# Patient Record
Sex: Male | Born: 1940 | Race: Black or African American | Marital: Married | Smoking: Former smoker
Health system: Southern US, Community
[De-identification: ages and names within clinical notes are randomized; demographics above are authoritative.]

## PROBLEM LIST (undated history)

## (undated) DIAGNOSIS — I10 Essential (primary) hypertension: Secondary | ICD-10-CM

## (undated) DIAGNOSIS — H409 Unspecified glaucoma: Secondary | ICD-10-CM

## (undated) DIAGNOSIS — E7800 Pure hypercholesterolemia, unspecified: Secondary | ICD-10-CM

## (undated) DIAGNOSIS — F1010 Alcohol abuse, uncomplicated: Secondary | ICD-10-CM

## (undated) DIAGNOSIS — M109 Gout, unspecified: Secondary | ICD-10-CM

## (undated) DIAGNOSIS — F0390 Unspecified dementia without behavioral disturbance: Secondary | ICD-10-CM

## (undated) DIAGNOSIS — H269 Unspecified cataract: Secondary | ICD-10-CM

## (undated) DIAGNOSIS — Z86718 Personal history of other venous thrombosis and embolism: Secondary | ICD-10-CM

## (undated) HISTORY — PX: HERNIA REPAIR: SHX51

## (undated) HISTORY — DX: Personal history of other venous thrombosis and embolism: Z86.718

## (undated) HISTORY — DX: Alcohol abuse, uncomplicated: F10.10

## (undated) HISTORY — DX: Pure hypercholesterolemia, unspecified: E78.00

## (undated) HISTORY — DX: Unspecified cataract: H26.9

## (undated) HISTORY — DX: Gout, unspecified: M10.9

## (undated) HISTORY — DX: Unspecified glaucoma: H40.9

---

## 2006-02-25 DIAGNOSIS — C61 Malignant neoplasm of prostate: Secondary | ICD-10-CM

## 2006-02-25 HISTORY — DX: Malignant neoplasm of prostate: C61

## 2011-02-26 DIAGNOSIS — Z5189 Encounter for other specified aftercare: Secondary | ICD-10-CM

## 2011-02-26 HISTORY — DX: Encounter for other specified aftercare: Z51.89

## 2019-02-26 DIAGNOSIS — C109 Malignant neoplasm of oropharynx, unspecified: Secondary | ICD-10-CM

## 2019-02-26 HISTORY — DX: Malignant neoplasm of oropharynx, unspecified: C10.9

## 2019-05-12 ENCOUNTER — Inpatient Hospital Stay: Payer: Medicare Other | Attending: Oncology | Admitting: Oncology

## 2019-05-12 ENCOUNTER — Inpatient Hospital Stay: Payer: Medicare Other

## 2019-05-12 ENCOUNTER — Encounter: Payer: Self-pay | Admitting: Oncology

## 2019-05-12 VITALS — BP 130/79 | HR 65 | Temp 97.4°F | Wt 113.2 lb

## 2019-05-12 DIAGNOSIS — F1010 Alcohol abuse, uncomplicated: Secondary | ICD-10-CM | POA: Diagnosis not present

## 2019-05-12 DIAGNOSIS — Z87891 Personal history of nicotine dependence: Secondary | ICD-10-CM | POA: Diagnosis not present

## 2019-05-12 DIAGNOSIS — R5383 Other fatigue: Secondary | ICD-10-CM | POA: Diagnosis not present

## 2019-05-12 DIAGNOSIS — R221 Localized swelling, mass and lump, neck: Secondary | ICD-10-CM

## 2019-05-12 DIAGNOSIS — I712 Thoracic aortic aneurysm, without rupture: Secondary | ICD-10-CM

## 2019-05-12 DIAGNOSIS — I7121 Aneurysm of the ascending aorta, without rupture: Secondary | ICD-10-CM

## 2019-05-12 LAB — COMPREHENSIVE METABOLIC PANEL
ALT: 16 U/L (ref 0–44)
AST: 20 U/L (ref 15–41)
Albumin: 4 g/dL (ref 3.5–5.0)
Alkaline Phosphatase: 57 U/L (ref 38–126)
Anion gap: 8 (ref 5–15)
BUN: 14 mg/dL (ref 8–23)
CO2: 28 mmol/L (ref 22–32)
Calcium: 9.4 mg/dL (ref 8.9–10.3)
Chloride: 103 mmol/L (ref 98–111)
Creatinine, Ser: 0.63 mg/dL (ref 0.61–1.24)
GFR calc Af Amer: >60 mL/min (ref >60)
GFR calc non Af Amer: >60 mL/min (ref >60)
Glucose, Bld: 79 mg/dL (ref 70–99)
Potassium: 4 mmol/L (ref 3.5–5.1)
Sodium: 139 mmol/L (ref 135–145)
Total Bilirubin: 1.2 mg/dL (ref 0.3–1.2)
Total Protein: 7.7 g/dL (ref 6.5–8.1)

## 2019-05-12 LAB — CBC WITH DIFFERENTIAL/PLATELET
Abs Immature Granulocytes: 0.01 10*3/uL (ref 0.00–0.07)
Basophils Absolute: 0 10*3/uL (ref 0.0–0.1)
Basophils Relative: 0 %
Eosinophils Absolute: 0.2 10*3/uL (ref 0.0–0.5)
Eosinophils Relative: 3 %
HCT: 39.5 % (ref 39.0–52.0)
Hemoglobin: 12.7 g/dL — ABNORMAL LOW (ref 13.0–17.0)
Immature Granulocytes: 0 %
Lymphocytes Relative: 23 %
Lymphs Abs: 1.3 10*3/uL (ref 0.7–4.0)
MCH: 30.2 pg (ref 26.0–34.0)
MCHC: 32.2 g/dL (ref 30.0–36.0)
MCV: 93.8 fL (ref 80.0–100.0)
Monocytes Absolute: 0.4 10*3/uL (ref 0.1–1.0)
Monocytes Relative: 8 %
Neutro Abs: 3.9 10*3/uL (ref 1.7–7.7)
Neutrophils Relative %: 66 %
Platelets: 222 10*3/uL (ref 150–400)
RBC: 4.21 MIL/uL — ABNORMAL LOW (ref 4.22–5.81)
RDW: 12.7 % (ref 11.5–15.5)
WBC: 5.8 10*3/uL (ref 4.0–10.5)
nRBC: 0 % (ref 0.0–0.2)

## 2019-05-12 LAB — TSH: TSH: 1.105 u[IU]/mL (ref 0.350–4.500)

## 2019-05-12 LAB — LACTATE DEHYDROGENASE: LDH: 100 U/L (ref 98–192)

## 2019-05-12 LAB — PROTIME-INR
INR: 1.1 (ref 0.8–1.2)
Prothrombin Time: 13.9 seconds (ref 11.4–15.2)

## 2019-05-12 LAB — APTT: aPTT: 31 seconds (ref 24–36)

## 2019-05-12 LAB — VITAMIN B12: Vitamin B-12: 569 pg/mL (ref 180–914)

## 2019-05-12 LAB — FOLATE: Folate: >100 ng/mL (ref >5.9)

## 2019-05-12 NOTE — Progress Notes (Signed)
Patient has recently moved to [facility] from [place].  Before making the move 3 weeks ago, he had a work up for right neck lymph node swollen for approximately for 7 months.  The swollen lymph node is "sore", makes him cough, and he can feel it when he swallows with no trouble swallowing.  He reports a decrease in appetite that has caused him to have recent weight loss.

## 2019-05-14 ENCOUNTER — Encounter: Payer: Self-pay | Admitting: Oncology

## 2019-05-14 DIAGNOSIS — Z87891 Personal history of nicotine dependence: Secondary | ICD-10-CM | POA: Insufficient documentation

## 2019-05-14 DIAGNOSIS — R5383 Other fatigue: Secondary | ICD-10-CM | POA: Insufficient documentation

## 2019-05-14 DIAGNOSIS — R221 Localized swelling, mass and lump, neck: Secondary | ICD-10-CM | POA: Insufficient documentation

## 2019-05-14 DIAGNOSIS — F1010 Alcohol abuse, uncomplicated: Secondary | ICD-10-CM | POA: Insufficient documentation

## 2019-05-14 DIAGNOSIS — I7121 Aneurysm of the ascending aorta, without rupture: Secondary | ICD-10-CM | POA: Insufficient documentation

## 2019-05-14 DIAGNOSIS — I712 Thoracic aortic aneurysm, without rupture: Secondary | ICD-10-CM | POA: Insufficient documentation

## 2019-05-14 NOTE — Progress Notes (Signed)
Hematology/Oncology Consult note [facility] Telephone:([phone] [phone]   Patient Care Team: Casilda Carls, MD as PCP - General (Internal Medicine)  REFERRING PROVIDER: Casilda Carls, MD  CHIEF COMPLAINTS/REASON FOR VISIT:  Evaluation of neck lymph node.   HISTORY OF PRESENTING ILLNESS:   Gregory Burton is a  79 y.o.  male with PMH listed below was seen in consultation at the request of  Casilda Carls, MD  for evaluation of lymph node.  Patient moved from [place] to [place] for about 3 weeks. He has noticed a neck knot on the right side of his neck. The knot is sore and makes him to cough and he feels it when he swallows. No swallowing difficulty.  Patient was accompanied by his wife. She reports that patient's previous PCP has done neck sonogram for evaluation.  Patient also had right lower molar tooth extraction done a few weeks before he noticed  He also took a course of antibiotics.  Due to his tooth pain, his appetite has decreased and he has lost weight loss, about 20 pounds, he can not specify the time frame.   Alcoholism History of prostate cancer.   Review of Systems  Constitutional: Positive for appetite change, fatigue and unexpected weight change. Negative for chills and fever.  HENT:   Negative for hearing loss and voice change.   Eyes: Negative for eye problems and icterus.  Respiratory: Negative for chest tightness, cough and shortness of breath.   Cardiovascular: Negative for chest pain and leg swelling.  Gastrointestinal: Negative for abdominal distention and abdominal pain.  Endocrine: Negative for hot flashes.  Genitourinary: Negative for difficulty urinating, dysuria and frequency.   Musculoskeletal: Negative for arthralgias.  Skin: Negative for itching and rash.  Neurological: Negative for light-headedness and numbness.  Hematological: Negative for adenopathy. Does not bruise/bleed easily.    Psychiatric/Behavioral: Negative for confusion.    MEDICAL HISTORY:  Past Medical History:  Diagnosis Date  . Alcohol abuse   . Blood transfusion without reported diagnosis 2013  . Cataract   . Glaucoma   . Gout   . Hypercholesteremia   . Prostate CA [facility]) 2008    SURGICAL HISTORY: Past Surgical History:  Procedure Laterality Date  . BRAIN SURGERY  2012  . HERNIA REPAIR      SOCIAL HISTORY: Social History   Socioeconomic History  . Marital status: Married    Spouse name: Not on file  . Number of children: Not on file  . Years of education: Not on file  . Highest education level: Not on file  Occupational History  . Not on file  Tobacco Use  . Smoking status: Former Smoker    Years: 21.00    Types: Cigarettes    Quit date: 05/11/2008    Years since quitting: 11.0  . Smokeless tobacco: Never Used  Substance and Sexual Activity  . Alcohol use: Yes    Comment: 0.5 pint liquor a week  . Drug use: Not on file  . Sexual activity: Not Currently  Other Topics Concern  . Not on file  Social History Narrative  . Not on file   Social Determinants of Health   Financial Resource Strain:   . Difficulty of Paying Living Expenses:   Food Insecurity:   . Worried About Charity fundraiser in the Last Year:   . Arboriculturist in the Last Year:   Transportation Needs:   . Film/video editor (Medical):   Marland Kitchen  Lack of Transportation (Non-Medical):   Physical Activity:   . Days of Exercise per Week:   . Minutes of Exercise per Session:   Stress:   . Feeling of Stress :   Social Connections:   . Frequency of Communication with Friends and Family:   . Frequency of Social Gatherings with Friends and Family:   . Attends Religious Services:   . Active Member of Clubs or Organizations:   . Attends Archivist Meetings:   Marland Kitchen Marital Status:   Intimate Partner Violence:   . Fear of Current or Ex-Partner:   . Emotionally Abused:   Marland Kitchen Physically Abused:   .  Sexually Abused:     FAMILY HISTORY: History reviewed. No pertinent family history.  ALLERGIES:  is allergic to enalapril.  MEDICATIONS:  Current Outpatient Medications  Medication Sig Dispense Refill  . atorvastatin (LIPITOR) 20 MG tablet Take 20 mg by mouth daily.    Marland Kitchen donepezil (ARICEPT) 10 MG tablet Take 10 mg by mouth at bedtime.    . dorzolamide-timolol (COSOPT) 22.3-6.8 MG/ML ophthalmic solution 1 drop 2 (two) times daily.    . folic acid (FOLVITE) 1 MG tablet Take 1 mg by mouth daily.    Marland Kitchen latanoprost (XALATAN) 0.005 % ophthalmic solution 1 drop at bedtime.    . predniSONE (DELTASONE) 5 MG tablet Take 5 mg by mouth daily with breakfast. As needed for gout flare    . Thiamine HCl (VITAMIN B-1 PO) Take by mouth. 100 mg QD    . Vitamin D, Cholecalciferol, 50 MCG (2000 UT) CAPS Take 1 capsule by mouth.     No current facility-administered medications for this visit.     PHYSICAL EXAMINATION: ECOG PERFORMANCE STATUS: 1 - Symptomatic but completely ambulatory Vitals:   05/12/19 1455  BP: 130/79  Pulse: 65  Temp: (!) 97.4 F (36.3 C)   Filed Weights   05/12/19 1455  Weight: 113 lb 3.2 oz (51.3 kg)    Physical Exam Constitutional:      General: He is not in acute distress. HENT:     Head: Normocephalic and atraumatic.  Eyes:     General: No scleral icterus. Neck:     Comments: Palpable 1-2cm right neck mass  Cardiovascular:     Rate and Rhythm: Normal rate and regular rhythm.     Heart sounds: Normal heart sounds.  Pulmonary:     Effort: Pulmonary effort is normal. No respiratory distress.     Breath sounds: No wheezing.  Abdominal:     General: Bowel sounds are normal. There is no distension.     Palpations: Abdomen is soft.  Musculoskeletal:        General: No deformity. Normal range of motion.     Cervical back: Normal range of motion and neck supple.  Skin:    General: Skin is warm and dry.     Findings: No erythema or rash.  Neurological:     Mental  Status: He is alert and oriented to person, place, and time. Mental status is at baseline.     Cranial Nerves: No cranial nerve deficit.     Coordination: Coordination normal.  Psychiatric:        Mood and Affect: Mood normal.     LABORATORY DATA:  I have reviewed the data as listed Lab Results  Component Value Date   WBC 5.8 05/12/2019   HGB 12.7 (L) 05/12/2019   HCT 39.5 05/12/2019   MCV 93.8 05/12/2019   PLT 222  05/12/2019   Recent Labs    05/12/19 1603  NA 139  K 4.0  CL 103  CO2 28  GLUCOSE 79  BUN 14  CREATININE 0.63  CALCIUM 9.4  GFRNONAA >60  GFRAA >60  PROT 7.7  ALBUMIN 4.0  AST 20  ALT 16  ALKPHOS 57  BILITOT 1.2   Iron/TIBC/Ferritin/ %Sat No results found for: IRON, TIBC, FERRITIN, IRONPCTSAT    RADIOGRAPHIC STUDIES: I have personally reviewed the radiological images as listed and agreed with the findings in the report. No results found.    ASSESSMENT & PLAN:  1. Neck mass   2. Other fatigue   3. Alcohol abuse   4. Former smoker   5. 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HCC)    Right neck mass, duration >3 weeks, recent right lower molar tooth extraction, s/p antibiotics course.  Risk factor: Former smoker and alcohol use. Infectious etiology vs malignancy I will obtain CT neck with contrast for further evaluation.  Check cbc cmp, LDH, PT, PTT, TSH.   alcoholism, cessation was discussed. He is not interested.  Check folate and vitamin B12 level.   Former smoker, he has had lung cancer screening chest CT which no suspicious lung mass.  Ascending aortic aneurysm, 4cm. Not changed from 2018. - monitor.  Calcified pleural plaques Extensive coronary artery calcification.   Orders Placed This Encounter  Procedures  . CBC with Differential/Platelet    Standing Status:   Future    Number of Occurrences:   1    Standing Expiration Date:   05/11/2020  . Comprehensive metabolic panel    Standing Status:   Future    Number of Occurrences:   1     Standing Expiration Date:   05/11/2020  . Lactate dehydrogenase    Standing Status:   Future    Number of Occurrences:   1    Standing Expiration Date:   05/11/2020  . APTT    Standing Status:   Future    Number of Occurrences:   1    Standing Expiration Date:   05/11/2020  . Protime-INR    Standing Status:   Future    Number of Occurrences:   1    Standing Expiration Date:   05/11/2020  . TSH    Standing Status:   Future    Number of Occurrences:   1    Standing Expiration Date:   05/11/2020  . Vitamin B12    Standing Status:   Future    Number of Occurrences:   1    Standing Expiration Date:   11/11/2020  . Folate    Standing Status:   Future    Number of Occurrences:   1    Standing Expiration Date:   11/11/2020    All questions were answered. The patient knows to call the clinic with any problems questions or concerns.  cc Casilda Carls, MD    Return of visit: after CT scan.  Thank you for this kind referral and the opportunity to participate in the care of this patient. A copy of today's note is routed to referring provider    Earlie Server, MD, PhD Hematology Oncology [facility] Pager- [phone] 05/14/2019

## 2019-05-17 ENCOUNTER — Telehealth: Payer: Self-pay

## 2019-05-17 NOTE — Telephone Encounter (Signed)
-----   Message from Earlie Server, MD sent at 05/14/2019 10:45 PM EDT ----- Please let patient know that I recommend to obtain CT neck for further evaluation of his neck mass.  MD follow up after CT. Thanks.

## 2019-05-17 NOTE — Telephone Encounter (Signed)
Please schedule appts and notifiy pt. Wife is aware. Thanks

## 2019-05-17 NOTE — Telephone Encounter (Signed)
Done.. Pts appts has been scheduled as requested

## 2019-05-24 ENCOUNTER — Ambulatory Visit: Payer: Medicare Other

## 2019-05-27 ENCOUNTER — Other Ambulatory Visit: Payer: Self-pay

## 2019-05-27 ENCOUNTER — Ambulatory Visit
Admission: RE | Admit: 2019-05-27 | Discharge: 2019-05-27 | Disposition: A | Discharge status: Home / Self Care | Payer: Medicare Other | Source: Ambulatory Visit | Attending: Oncology | Admitting: Oncology

## 2019-05-27 DIAGNOSIS — R221 Localized swelling, mass and lump, neck: Secondary | ICD-10-CM | POA: Diagnosis not present

## 2019-05-27 IMAGING — CT CT NECK W/ CM
4 of 5 series · 14 of 33 positions shown, 16 images · IV contrast (omnipaque)
Comparison: None.

CLINICAL DATA: Right neck mass

EXAM:
CT NECK WITH CONTRAST
TECHNIQUE: Multidetector CT imaging of the neck was performed using the
standard protocol following the bolus administration of intravenous
contrast.
CONTRAST:  75mL OMNIPAQUE IOHEXOL 300 MG/ML  SOLN

[Series 2: axial neck neck (person_name) 2.00 · axial · 0.62mm/px · z∈[-754,-674]mm · 2 of 122 slices shown]
[im 41/122  bone]
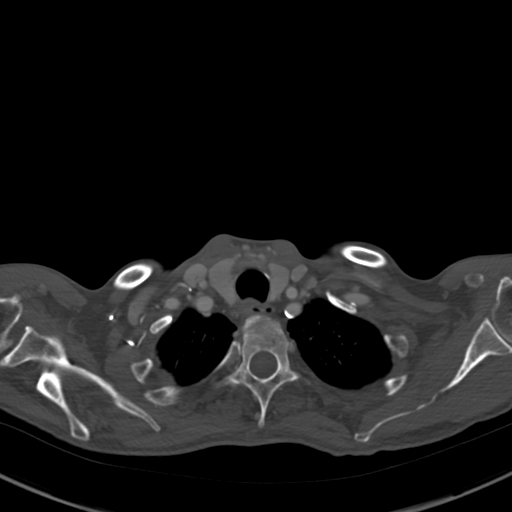
[im 81/122  bone]
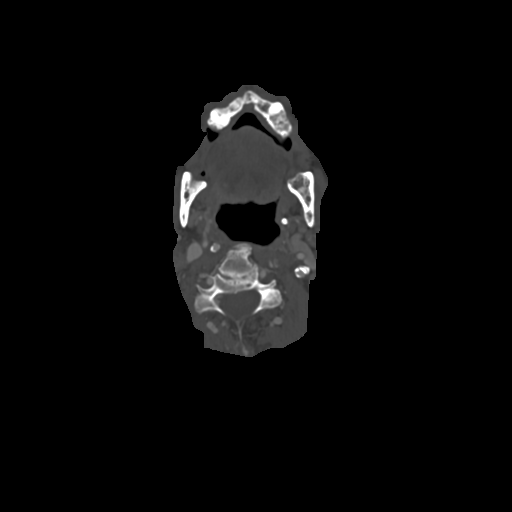

[Series 4: coronal neck neck (person_name) 2.00 cor · coronal · 0.56mm/px · 3 of 137 slices shown]
[im 48/137  bone]
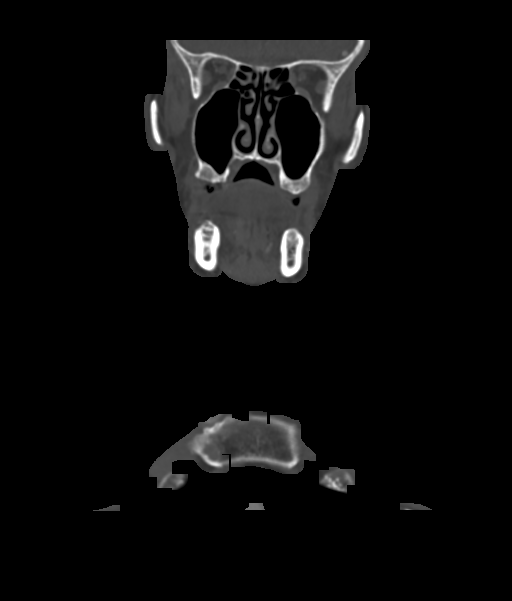
[im 62/137  bone]
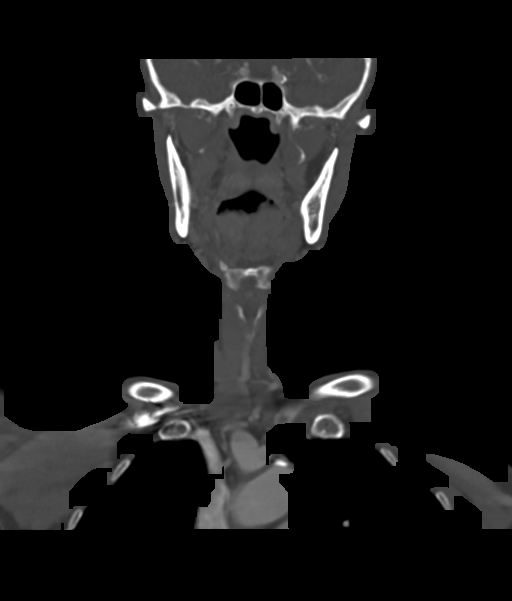
[im 76/137  bone]
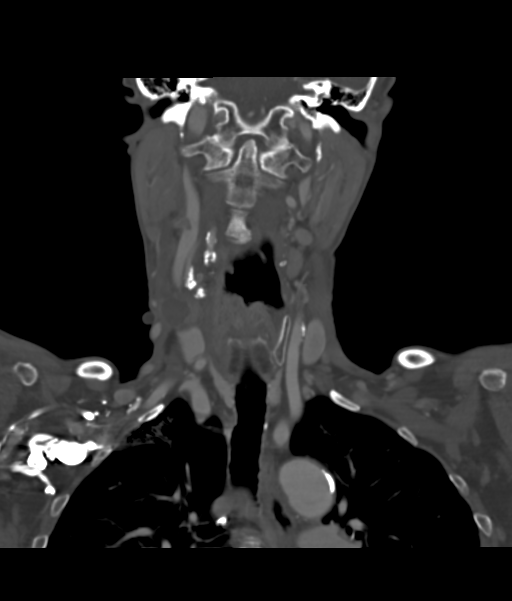

[Series 6: sagittal neck neck (person_name) 2.00 sag · sagittal · 0.54mm/px · 5 of 143 slices shown, 6 images]
[im 48/143  bone]
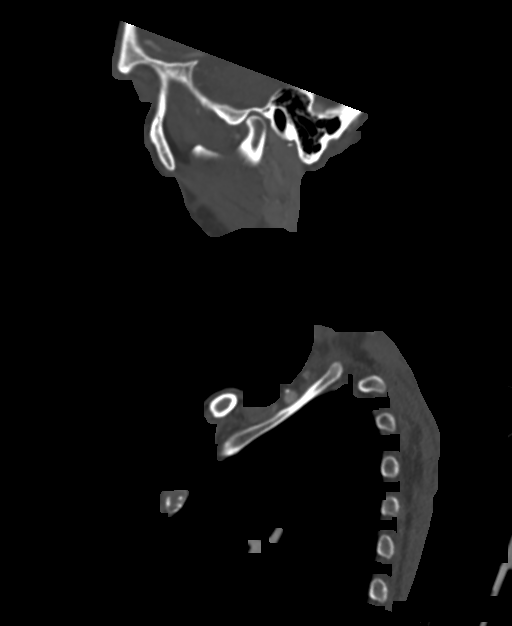
[im 60/143  bone]
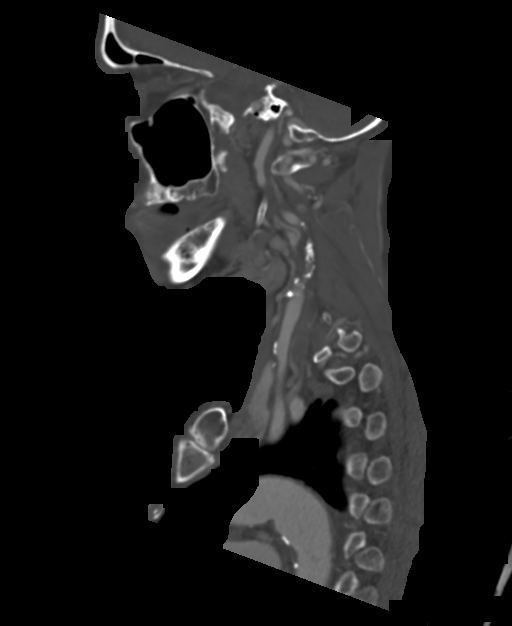
[im 72/143  soft-tissue]
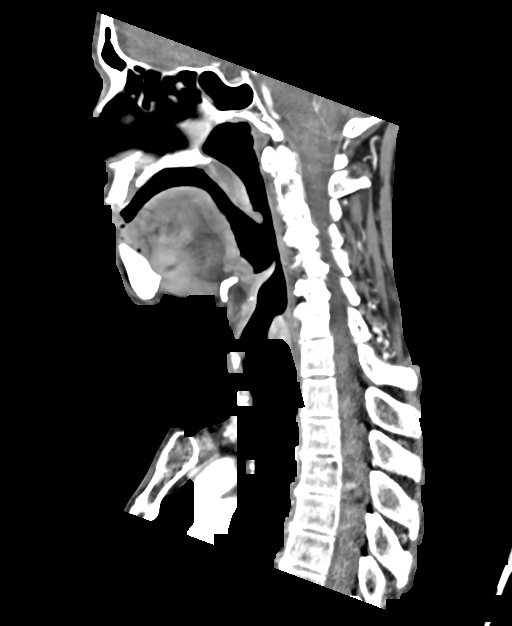
[im 72/143  bone]
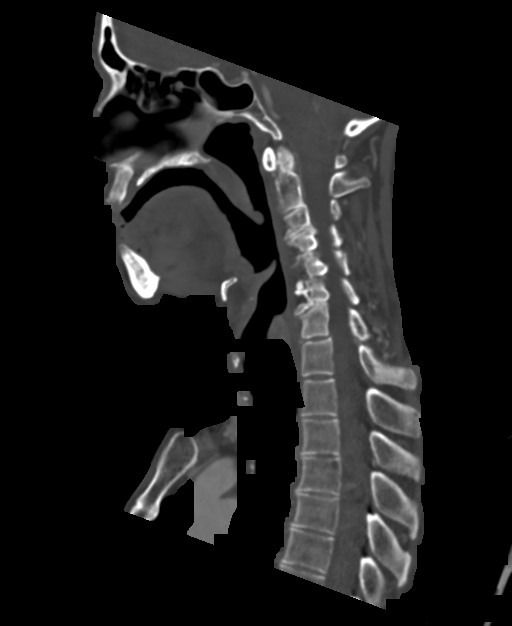
[im 83/143  bone]
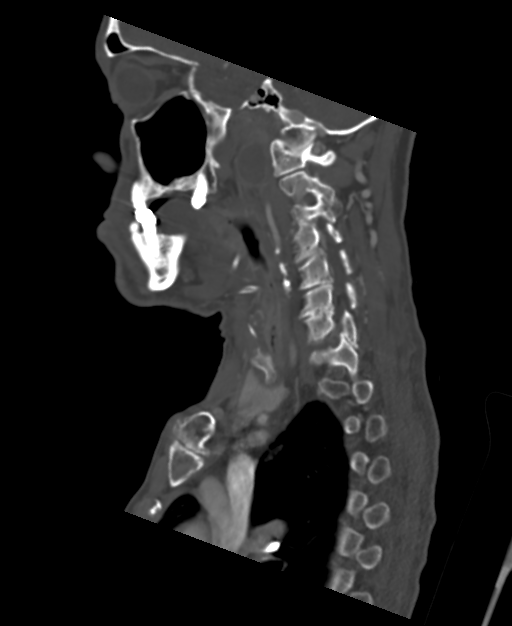
[im 95/143  bone]
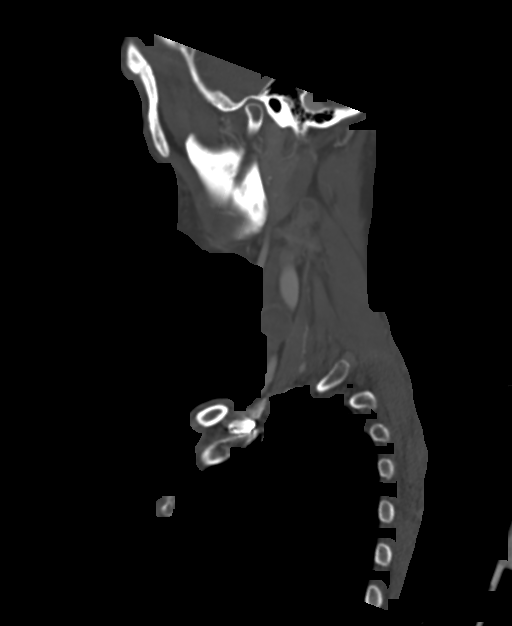

[Series 8: ax oropharynx neck neck (person_name) 2.00 ax · axial · 0.54mm/px · z∈[-854,-666]mm · 4 of 168 slices shown, 5 images]
[im 34/168  soft-tissue]
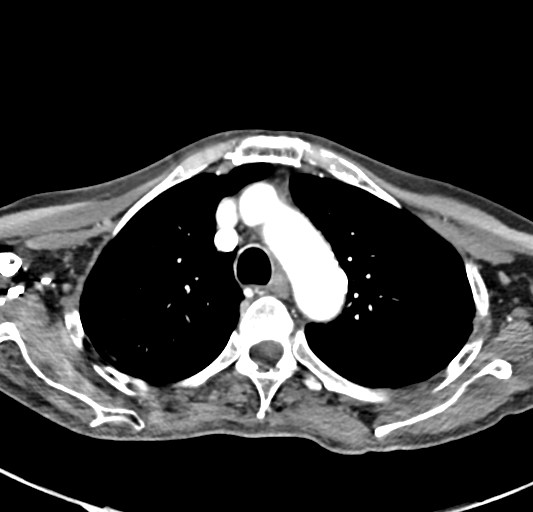
[im 34/168  bone]
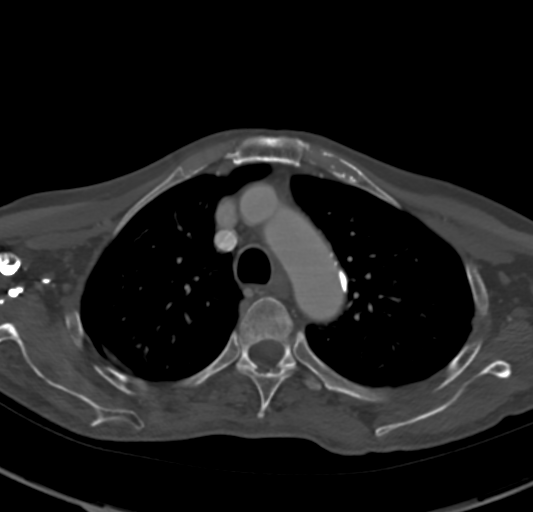
[im 67/168  bone]
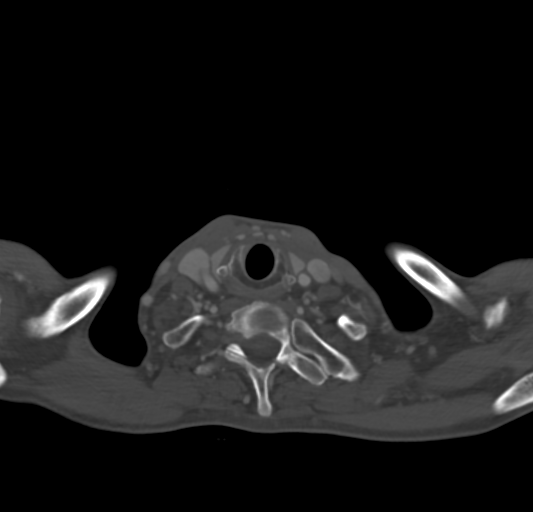
[im 101/168  bone]
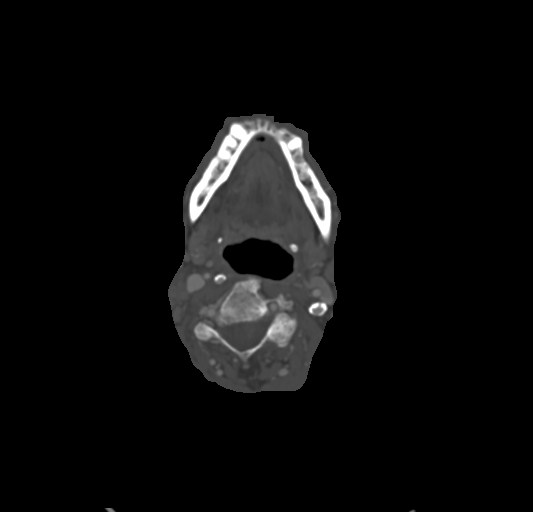
[im 134/168  bone]
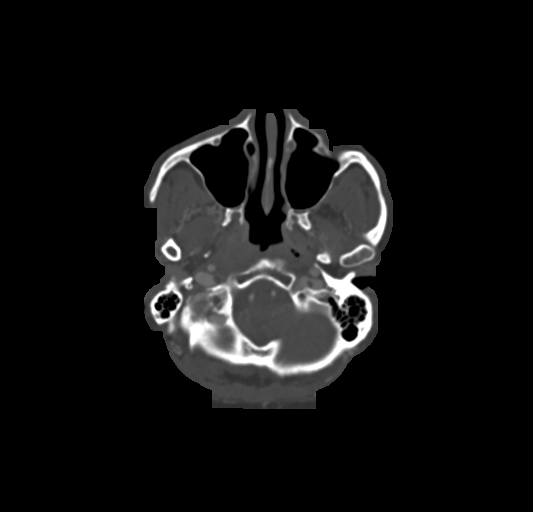

[14 of 33 positions shown; findings below may reference images not displayed]

FINDINGS: Pharynx and larynx: Enhancing mass in the right piriform sinus
measuring 17 mm in diameter. Appearance is concerning for neoplasm.
Possible extension into the base of the epiglottis on the right.
Base of tongue normal. No airway compromise. Larynx normal.

Salivary glands: No inflammation, mass, or stone.

Thyroid: Negative

Lymph nodes: Cystic mass in the right lateral neck at the level of
the piriform sinus. Cyst measures 15 x 24 mm and is deep to the
sternocleidomastoid muscle. There is mild enhancement of the wall
the cyst which is likely a necrotic lymph node.

Cystic mass in the retropharyngeal soft tissues on the right in the
region of the nasopharynx. This measures approximately 18 mm in
diameter and likely is a necrotic lymph node.

Cluster of lymph nodes in the right posterior neck measuring up to
12 mm in diameter. These are also likely malignant lymph nodes.

Vascular: Normal vascular enhancement.

Atherosclerotic calcification is present in the aortic arch and
carotid bifurcation. There is severe stenosis of the right internal
carotid artery due to heavily calcified plaque with decreased
caliber of the distal right internal carotid artery due to flow
limiting stenosis.

Posterior lymph node on the right appears to extend into the right
jugular vein without occlusion.

Limited intracranial: Negative

Visualized orbits: Negative

Mastoids and visualized paranasal sinuses: Mild mucosal edema in the
ethmoid sinuses. Remaining sinuses clear. Mastoid clear bilaterally.

Skeleton: Cervical spondylosis. Thoracic scoliosis. No acute
skeletal abnormality.

Right lower molar extraction site without complication.

Upper chest: 10 mm spiculated nodule left upper lobe with central
necrosis. Right apex clear.

Other: None
IMPRESSION: 1. Solid enhancing mass in the right piriform sinus measuring 17 mm,
highly suspicious for carcinoma.
2. Multiple lymph nodes in the right neck including cystic nodes in
the right posterior nasopharynx and right lateral neck. Posterior
lymph nodes in the right neck also likely malignant, 1 of which
extends into the right jugular vein. Tissue sampling and direct
laryngoscopy recommended to evaluate for neoplasm.
3. 10 mm spiculated mass left upper lobe likely carcinoma lung.
PET-CT would be helpful for further evaluation and staging.
4. Atherosclerotic disease. Severe stenosis right internal carotid
artery due to heavily calcified plaque. This appears to be a flow
limiting lesion with decreased caliber of the right internal carotid
artery which is patent.

## 2019-05-27 MED ORDER — IOHEXOL 300 MG/ML  SOLN
75.0000 mL | Freq: Once | INTRAMUSCULAR | Status: AC | PRN
  Administered 2019-05-27: 75 mL via INTRAVENOUS

## 2019-05-28 ENCOUNTER — Encounter: Payer: Self-pay | Admitting: Oncology

## 2019-05-28 ENCOUNTER — Inpatient Hospital Stay: Payer: Medicare Other | Attending: Oncology | Admitting: Oncology

## 2019-05-28 ENCOUNTER — Inpatient Hospital Stay: Payer: Medicare Other

## 2019-05-28 ENCOUNTER — Other Ambulatory Visit: Payer: Self-pay

## 2019-05-28 ENCOUNTER — Telehealth: Payer: Self-pay | Admitting: *Deleted

## 2019-05-28 VITALS — BP 133/81 | HR 68 | Temp 96.8°F | Resp 16 | Wt 113.4 lb

## 2019-05-28 DIAGNOSIS — R221 Localized swelling, mass and lump, neck: Secondary | ICD-10-CM

## 2019-05-28 DIAGNOSIS — Z79899 Other long term (current) drug therapy: Secondary | ICD-10-CM | POA: Diagnosis not present

## 2019-05-28 DIAGNOSIS — R634 Abnormal weight loss: Secondary | ICD-10-CM | POA: Insufficient documentation

## 2019-05-28 DIAGNOSIS — F1020 Alcohol dependence, uncomplicated: Secondary | ICD-10-CM | POA: Insufficient documentation

## 2019-05-28 DIAGNOSIS — R911 Solitary pulmonary nodule: Secondary | ICD-10-CM

## 2019-05-28 DIAGNOSIS — Z87891 Personal history of nicotine dependence: Secondary | ICD-10-CM

## 2019-05-28 DIAGNOSIS — F1010 Alcohol abuse, uncomplicated: Secondary | ICD-10-CM

## 2019-05-28 DIAGNOSIS — I712 Thoracic aortic aneurysm, without rupture: Secondary | ICD-10-CM | POA: Diagnosis not present

## 2019-05-28 DIAGNOSIS — Z8546 Personal history of malignant neoplasm of prostate: Secondary | ICD-10-CM | POA: Diagnosis not present

## 2019-05-28 DIAGNOSIS — C109 Malignant neoplasm of oropharynx, unspecified: Secondary | ICD-10-CM | POA: Insufficient documentation

## 2019-05-28 DIAGNOSIS — R630 Anorexia: Secondary | ICD-10-CM | POA: Diagnosis not present

## 2019-05-28 DIAGNOSIS — I6529 Occlusion and stenosis of unspecified carotid artery: Secondary | ICD-10-CM | POA: Diagnosis not present

## 2019-05-28 DIAGNOSIS — I7121 Aneurysm of the ascending aorta, without rupture: Secondary | ICD-10-CM

## 2019-05-28 LAB — PSA: Prostatic Specific Antigen: <0.01 ng/mL (ref 0.00–4.00)

## 2019-05-28 LAB — PROTIME-INR
INR: 1.1 (ref 0.8–1.2)
Prothrombin Time: 14 seconds (ref 11.4–15.2)

## 2019-05-28 LAB — APTT: aPTT: 33 seconds (ref 24–36)

## 2019-05-28 NOTE — Telephone Encounter (Signed)
Please arrange redraw. Thanks.

## 2019-05-28 NOTE — Telephone Encounter (Signed)
Pt will be coming back today for lab redraw. Lab is aware.

## 2019-05-28 NOTE — Progress Notes (Signed)
Hematology/Oncology Consult note [facility] Telephone:([phone] [phone]   Patient Care Team: Casilda Carls, MD as PCP - General (Internal Medicine)  REFERRING PROVIDER: Casilda Carls, MD  CHIEF COMPLAINTS/REASON FOR VISIT:   follow up for neck lymph node.   HISTORY OF PRESENTING ILLNESS:   Tagen Milby is a  79 y.o.  male with PMH listed below was seen in consultation at the request of  Casilda Carls, MD  for evaluation of lymph node.  Patient moved from [place] to [place] for about 3 weeks. He has noticed a neck knot on the right side of his neck. The knot is sore and makes him to cough and he feels it when he swallows. No swallowing difficulty.  Patient was accompanied by his wife. She reports that patient's previous PCP has done neck sonogram for evaluation.  Patient also had right lower molar tooth extraction done a few weeks before he noticed  He also took a course of antibiotics.  Due to his tooth pain, his appetite has decreased and he has lost weight loss, about 20 pounds, he can not specify the time frame.   Alcoholism History of prostate cancer, he follows up with [facility].   INTERVAL HISTORY Romaine Neville is a 79 y.o. male who has above history reviewed by me today presents for follow up visit for management of neck mass Problems and complaints are listed below: He continues to have some soreness along his neck mass.  He has a chronic cough due to " dry throat".   Review of Systems  Constitutional: Positive for appetite change, fatigue and unexpected weight change. Negative for chills and fever.  HENT:   Negative for hearing loss and voice change.   Eyes: Negative for eye problems and icterus.  Respiratory: Negative for chest tightness, cough and shortness of breath.   Cardiovascular: Negative for chest pain and leg swelling.  Gastrointestinal: Negative for abdominal distention and abdominal pain.  Endocrine:  Negative for hot flashes.  Genitourinary: Negative for difficulty urinating, dysuria and frequency.   Musculoskeletal: Negative for arthralgias.  Skin: Negative for itching and rash.  Neurological: Negative for light-headedness and numbness.  Hematological: Negative for adenopathy. Does not bruise/bleed easily.  Psychiatric/Behavioral: Negative for confusion.    MEDICAL HISTORY:  Past Medical History:  Diagnosis Date  . Alcohol abuse   . Blood transfusion without reported diagnosis 2013  . Cataract   . Glaucoma   . Gout   . Hypercholesteremia   . Prostate CA [facility]) 2008    SURGICAL HISTORY: Past Surgical History:  Procedure Laterality Date  . BRAIN SURGERY  2012  . HERNIA REPAIR      SOCIAL HISTORY: Social History   Socioeconomic History  . Marital status: Married    Spouse name: Not on file  . Number of children: Not on file  . Years of education: Not on file  . Highest education level: Not on file  Occupational History  . Not on file  Tobacco Use  . Smoking status: Former Smoker    Years: 21.00    Types: Cigarettes    Quit date: 05/11/2008    Years since quitting: 11.0  . Smokeless tobacco: Never Used  Substance and Sexual Activity  . Alcohol use: Yes    Comment: 0.5 pint liquor a week  . Drug use: Not on file  . Sexual activity: Not Currently  Other Topics Concern  . Not on file  Social History Narrative  . Not on  file   Social Determinants of Health   Financial Resource Strain:   . Difficulty of Paying Living Expenses:   Food Insecurity:   . Worried About Charity fundraiser in the Last Year:   . Arboriculturist in the Last Year:   Transportation Needs:   . Film/video editor (Medical):   Marland Kitchen Lack of Transportation (Non-Medical):   Physical Activity:   . Days of Exercise per Week:   . Minutes of Exercise per Session:   Stress:   . Feeling of Stress :   Social Connections:   . Frequency of Communication with Friends and Family:   . Frequency  of Social Gatherings with Friends and Family:   . Attends Religious Services:   . Active Member of Clubs or Organizations:   . Attends Archivist Meetings:   Marland Kitchen Marital Status:   Intimate Partner Violence:   . Fear of Current or Ex-Partner:   . Emotionally Abused:   Marland Kitchen Physically Abused:   . Sexually Abused:     FAMILY HISTORY: History reviewed. No pertinent family history.  ALLERGIES:  is allergic to enalapril.  MEDICATIONS:  Current Outpatient Medications  Medication Sig Dispense Refill  . atorvastatin (LIPITOR) 20 MG tablet Take 20 mg by mouth daily.    Marland Kitchen donepezil (ARICEPT) 10 MG tablet Take 10 mg by mouth at bedtime.    . dorzolamide-timolol (COSOPT) 22.3-6.8 MG/ML ophthalmic solution 1 drop 2 (two) times daily.    . folic acid (FOLVITE) 1 MG tablet Take 1 mg by mouth daily.    Marland Kitchen latanoprost (XALATAN) 0.005 % ophthalmic solution 1 drop at bedtime.    . predniSONE (DELTASONE) 5 MG tablet Take 5 mg by mouth daily with breakfast. As needed for gout flare    . Thiamine HCl (VITAMIN B-1 PO) Take by mouth. 100 mg QD    . Vitamin D, Cholecalciferol, 50 MCG (2000 UT) CAPS Take 1 capsule by mouth.     No current facility-administered medications for this visit.     PHYSICAL EXAMINATION: ECOG PERFORMANCE STATUS: 1 - Symptomatic but completely ambulatory Vitals:   05/28/19 0927  BP: 133/81  Pulse: 68  Resp: 16  Temp: (!) 96.8 F (36 C)   Filed Weights   05/28/19 0927  Weight: 113 lb 6.4 oz (51.4 kg)    Physical Exam Constitutional:      General: He is not in acute distress. HENT:     Head: Normocephalic and atraumatic.  Eyes:     General: No scleral icterus. Neck:     Comments: Palpable 1-2cm right neck mass  Cardiovascular:     Rate and Rhythm: Normal rate and regular rhythm.     Heart sounds: Normal heart sounds.  Pulmonary:     Effort: Pulmonary effort is normal. No respiratory distress.     Breath sounds: No wheezing.  Abdominal:     General:  Bowel sounds are normal. There is no distension.     Palpations: Abdomen is soft.  Musculoskeletal:        General: No deformity. Normal range of motion.     Cervical back: Normal range of motion and neck supple.  Skin:    General: Skin is warm and dry.     Findings: No erythema or rash.  Neurological:     Mental Status: He is alert and oriented to person, place, and time. Mental status is at baseline.     Cranial Nerves: No cranial nerve deficit.  Coordination: Coordination normal.  Psychiatric:        Mood and Affect: Mood normal.     LABORATORY DATA:  I have reviewed the data as listed Lab Results  Component Value Date   WBC 5.8 05/12/2019   HGB 12.7 (L) 05/12/2019   HCT 39.5 05/12/2019   MCV 93.8 05/12/2019   PLT 222 05/12/2019   Recent Labs    05/12/19 1603  NA 139  K 4.0  CL 103  CO2 28  GLUCOSE 79  BUN 14  CREATININE 0.63  CALCIUM 9.4  GFRNONAA >60  GFRAA >60  PROT 7.7  ALBUMIN 4.0  AST 20  ALT 16  ALKPHOS 57  BILITOT 1.2   Iron/TIBC/Ferritin/ %Sat No results found for: IRON, TIBC, FERRITIN, IRONPCTSAT    RADIOGRAPHIC STUDIES: I have personally reviewed the radiological images as listed and agreed with the findings in the report. CT SOFT TISSUE NECK W CONTRAST  Result Date: 05/27/2019 CLINICAL DATA:  Right neck mass EXAM: CT NECK WITH CONTRAST TECHNIQUE: Multidetector CT imaging of the neck was performed using the standard protocol following the bolus administration of intravenous contrast. CONTRAST:  62mL OMNIPAQUE IOHEXOL 300 MG/ML  SOLN COMPARISON:  None. FINDINGS: Pharynx and larynx: Enhancing mass in the right piriform sinus measuring 17 mm in diameter. Appearance is concerning for neoplasm. Possible extension into the base of the epiglottis on the right. Base of tongue normal. No airway compromise. Larynx normal. Salivary glands: No inflammation, mass, or stone. Thyroid: Negative Lymph nodes: Cystic mass in the right lateral neck at the level of  the piriform sinus. Cyst measures 15 x 24 mm and is deep to the sternocleidomastoid muscle. There is mild enhancement of the wall the cyst which is likely a necrotic lymph node. Cystic mass in the retropharyngeal soft tissues on the right in the region of the nasopharynx. This measures approximately 18 mm in diameter and likely is a necrotic lymph node. Cluster of lymph nodes in the right posterior neck measuring up to 12 mm in diameter. These are also likely malignant lymph nodes. Vascular: Normal vascular enhancement. Atherosclerotic calcification is present in the aortic arch and carotid bifurcation. 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 Posterior lymph node on the right appears to extend into the right jugular vein without occlusion. Limited intracranial: Negative Visualized orbits: Negative Mastoids and visualized paranasal sinuses: Mild mucosal edema in the ethmoid sinuses. Remaining sinuses clear. Mastoid clear bilaterally. Skeleton: Cervical spondylosis. Thoracic scoliosis. No acute skeletal abnormality. Right lower molar extraction site without complication. Upper chest: 10 mm spiculated nodule left upper lobe with central necrosis. Right apex clear. Other: None IMPRESSION: 1. Solid enhancing mass in the right piriform sinus measuring 17 mm, highly suspicious for carcinoma. 2. Multiple lymph nodes in the right neck including cystic nodes in the right posterior nasopharynx and right lateral neck. Posterior lymph nodes in the right neck also likely malignant, 1 of which extends into the right jugular vein. Tissue sampling and direct laryngoscopy recommended to evaluate for neoplasm. 3. 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 PET-CT would be helpful for further evaluation and staging. 4. Atherosclerotic disease. Severe stenosis right  internal carotid artery due to heavily calcified plaque. This appears to be a flow limiting lesion with decreased caliber of the right internal carotid artery which is patent. Electronically Signed   By:  Franchot Gallo M.D.   On: 05/27/2019 09:47      ASSESSMENT & PLAN:  1. Neck mass   2. Lung nodule   3. Alcohol abuse   4. Former smoker   5. 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[place])   6. Internal carotid artery stenosis, unspecified laterality     Right neck mass, duration >3 weeks, recent right lower molar tooth extraction, s/p antibiotics course. Risk factor: Former smoker and alcohol use. Infectious etiology vs malignancy 05/27/2019 CT soft tissue neck with contrast Image was independently reviewed by me and discussed with patient and his wife.  Right piriform sinus solid enhancing mass 1.7cm, multiple right cervical lymphadenopathy,-including right posterior nasopharynx and right lateral, and posterior neck.  33mm spiculated left upper lobe nodule, possible primary lung malignancy.  Atherosclerosis, right internal carotid stenosis.   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  Recommend PET scan for staging.  Discussed with patient and wife that I recommend patient establish care with ENT for laryngoscopy/tissue sampling. Discussed with [name].   alcoholism, I discussed with patient about alcohol cessation.  Adequate folate and vitamin B12 level. Check PT, PTT.  Liver function has been checked has been normal.  Former smoker, lung nodule pending PET scan work-up. Ascending aortic aneurysm, 4cm. Extensive coronary artery atherosclerosis, internal carotid artery stenosis. Advised patient to follow-up with primary care provider for further evaluation and management.   Orders Placed This Encounter  Procedures  . NM PET Image Initial (PI) Skull Base To Thigh    Standing Status:   Future    Standing Expiration Date:   05/27/2020    Order Specific  Question:   If indicated for the ordered procedure, I authorize the administration of a radiopharmaceutical per Radiology protocol    Answer:   Yes    Order Specific Question:   Preferred imaging location?    Answer:   [facility]    Order Specific Question:   Radiology Contrast Protocol - do NOT remove file path    Answer:   [email or web address]  . PSA    Standing Status:   Future    Number of Occurrences:   1    Standing Expiration Date:   05/27/2020  . Protime-INR    Standing Status:   Future    Standing Expiration Date:   05/27/2020  . APTT    Standing Status:   Future    Standing Expiration Date:   05/27/2020  . Ambulatory referral to ENT    Referral Priority:   Routine    Referral Type:   Consultation    Referral Reason:   Specialty Services Required    Referred to Provider:   Beverly Gust, MD    Requested Specialty:   Otolaryngology    Number of Visits Requested:   1    All questions were answered. The patient knows to call the clinic with any problems questions or concerns.   Return of visit:  To be determined.  cc Casilda Carls, MD    Earlie Server, MD, PhD Hematology Oncology [facility] Pager- [phone] 05/28/2019

## 2019-05-28 NOTE — Progress Notes (Signed)
Patient reports right side neck soreness and sore throat with swallowing.

## 2019-05-28 NOTE — Telephone Encounter (Signed)
Lab called to inform us that his blue tube for Patient INR, APTT was hemolyzed and needs to be redrawn.

## 2019-06-04 ENCOUNTER — Other Ambulatory Visit: Payer: Self-pay | Admitting: Unknown Physician Specialty

## 2019-06-04 ENCOUNTER — Ambulatory Visit: Payer: Medicare Other | Admitting: Oncology

## 2019-06-04 DIAGNOSIS — R221 Localized swelling, mass and lump, neck: Secondary | ICD-10-CM

## 2019-06-07 ENCOUNTER — Ambulatory Visit
Admission: RE | Admit: 2019-06-07 | Discharge: 2019-06-07 | Disposition: A | Discharge status: Home / Self Care | Payer: Medicare Other | Source: Ambulatory Visit | Attending: Oncology | Admitting: Oncology

## 2019-06-07 ENCOUNTER — Other Ambulatory Visit: Payer: Self-pay

## 2019-06-07 DIAGNOSIS — I2510 Atherosclerotic heart disease of native coronary artery without angina pectoris: Secondary | ICD-10-CM | POA: Insufficient documentation

## 2019-06-07 DIAGNOSIS — J929 Pleural plaque without asbestos: Secondary | ICD-10-CM | POA: Diagnosis not present

## 2019-06-07 DIAGNOSIS — I700 Atherosclerosis of aorta: Secondary | ICD-10-CM | POA: Insufficient documentation

## 2019-06-07 DIAGNOSIS — R921 Mammographic calcification found on diagnostic imaging of breast: Secondary | ICD-10-CM | POA: Diagnosis not present

## 2019-06-07 DIAGNOSIS — R911 Solitary pulmonary nodule: Secondary | ICD-10-CM | POA: Diagnosis not present

## 2019-06-07 DIAGNOSIS — R221 Localized swelling, mass and lump, neck: Secondary | ICD-10-CM | POA: Diagnosis present

## 2019-06-07 LAB — GLUCOSE, CAPILLARY: Glucose-Capillary: 56 mg/dL — ABNORMAL LOW (ref 70–99)

## 2019-06-07 IMAGING — PT NM PET TUM IMG INITIAL (PI) SKULL BASE T - THIGH
1 of 10 series · 1 of 25 positions shown · non-contrast
Comparison: CT [DATE]

CLINICAL DATA: Initial treatment strategy for neck mass.

EXAM:
NUCLEAR MEDICINE PET SKULL BASE TO THIGH
TECHNIQUE: 6.57 mCi F-18 FDG was injected intravenously. Full-ring PET imaging
was performed from the skull base to thigh after the radiotracer. CT
data was obtained and used for attenuation correction and anatomic
localization.
Fasting blood glucose: The 56 mg/dl

[Series 3: ct wb 5.0 b30f · axial · 5.0mm · 0.98mm/px · 1 of 290 slices shown]
[im 290/290  brain]
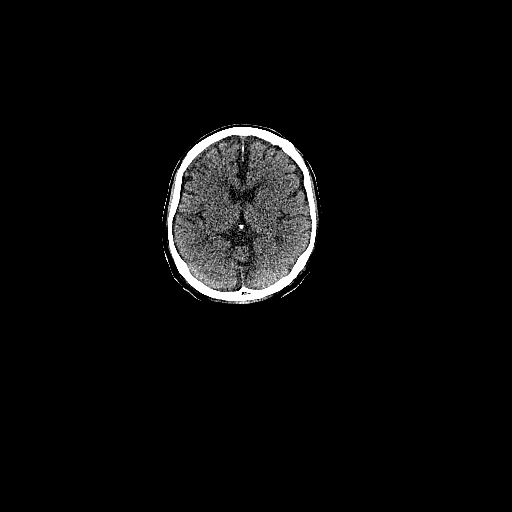

[1 of 25 positions shown; findings below may reference images not displayed]

FINDINGS: Mediastinal blood pool activity: SUV max

Liver activity: SUV max NA

NECK: Base of tongue mass extending into the right peer form sinus
is identified. This has an SUV max of 12.69.

Multiple right-sided FDG avid lymph nodes are identified. Index
right level 2 node within the measures 2.5 cm within SUV max of 5.7.

Right level 2 node posterior and medial to the parotid gland
measures 2.2 cm within SUV max of 9.6.

Right FDG avid node at the level of the piriform sinus measures
cm within SUV max of 5.8.

Incidental CT findings: none

CHEST: No FDG avid axillary, supraclavicular, mediastinal, or hilar
lymph nodes.

Within the posterolateral left upper lobe there is a nodule with
central cavitary component measuring 1 cm. This has an SUV max of
1.8, image 89/3.

Incidental CT findings: A few scattered calcified pleural plaques
are noted suggesting previous asbestos exposure. Aortic
atherosclerosis. Three vessel coronary artery atherosclerotic
calcifications.

ABDOMEN/PELVIS: No abnormal hypermetabolic activity within the
liver, pancreas, adrenal glands, or spleen. No hypermetabolic lymph
nodes in the abdomen or pelvis.

Incidental CT findings: Aortic atherosclerosis. Seed implants noted
within the prostate gland.

SKELETON: No focal hypermetabolic activity to suggest skeletal
metastasis.

Incidental CT findings: none
IMPRESSION: 1. Right base of tongue mass extending into the right piriform sinus
is FDG avid compatible with primary head neck carcinoma.
2. Multiple FDG avid cervical lymph nodes compatible with
ipsilateral nodal metastasis. No FDG avid left cervical lymph nodes
identified.
3. Left upper lobe pulmonary nodule exhibits mild FDG uptake within
SUV max of 1.8. Cannot rule out pulmonary metastasis.
4. Calcified pleural plaques compatible with asbestos related
pleural disease. No findings of pulmonary asbestosis.
5. Coronary artery calcifications
6.  Aortic Atherosclerosis ([2T]-[2T]).

## 2019-06-07 MED ORDER — FLUDEOXYGLUCOSE F - 18 (FDG) INJECTION
5.9000 | Freq: Once | INTRAVENOUS | Status: AC | PRN
  Administered 2019-06-07: 6.575 via INTRAVENOUS

## 2019-06-08 NOTE — Progress Notes (Signed)
Patient on schedule 06/11/2019 for right Cervical LN biopsy, made aware to wife and patient on phone to be here @ 0930, NPO after MN, and driver for home post procedure. Stated understanding.

## 2019-06-09 ENCOUNTER — Other Ambulatory Visit: Payer: Self-pay | Admitting: Radiology

## 2019-06-11 ENCOUNTER — Ambulatory Visit
Admission: RE | Admit: 2019-06-11 | Discharge: 2019-06-11 | Disposition: A | Discharge status: Home / Self Care | Payer: Medicare Other | Source: Ambulatory Visit | Attending: Unknown Physician Specialty | Admitting: Unknown Physician Specialty

## 2019-06-11 ENCOUNTER — Other Ambulatory Visit: Payer: Self-pay

## 2019-06-11 ENCOUNTER — Telehealth: Payer: Self-pay

## 2019-06-11 ENCOUNTER — Other Ambulatory Visit: Payer: Self-pay | Admitting: Unknown Physician Specialty

## 2019-06-11 DIAGNOSIS — R221 Localized swelling, mass and lump, neck: Secondary | ICD-10-CM

## 2019-06-11 DIAGNOSIS — Z8546 Personal history of malignant neoplasm of prostate: Secondary | ICD-10-CM | POA: Diagnosis not present

## 2019-06-11 DIAGNOSIS — Z79899 Other long term (current) drug therapy: Secondary | ICD-10-CM | POA: Insufficient documentation

## 2019-06-11 DIAGNOSIS — M109 Gout, unspecified: Secondary | ICD-10-CM | POA: Diagnosis not present

## 2019-06-11 DIAGNOSIS — C760 Malignant neoplasm of head, face and neck: Secondary | ICD-10-CM | POA: Diagnosis not present

## 2019-06-11 DIAGNOSIS — E7800 Pure hypercholesterolemia, unspecified: Secondary | ICD-10-CM | POA: Insufficient documentation

## 2019-06-11 IMAGING — US US BIOPSY LYMPH NODE
1 series · 12 of 12 positions shown · non-contrast
Comparison: none

INDICATION: PET positive right cervical necrotic adenopathy, concern for head
neck squamous cell carcinoma

[Series 1: us biopsy lymph node · 0.06mm/px · 12 of 12 slices shown]
[im 1/12]
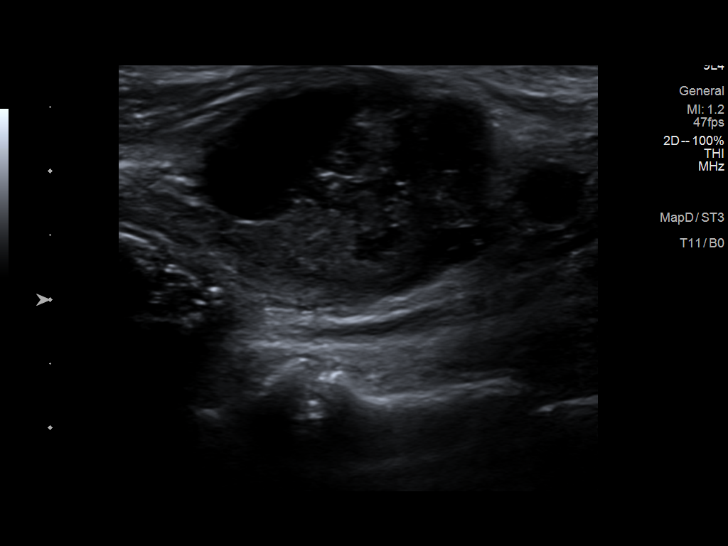
[im 2/12]
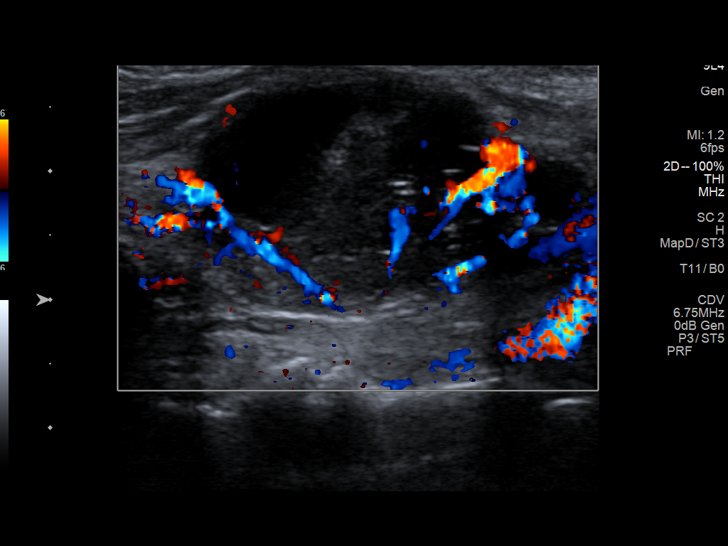
[im 3/12]
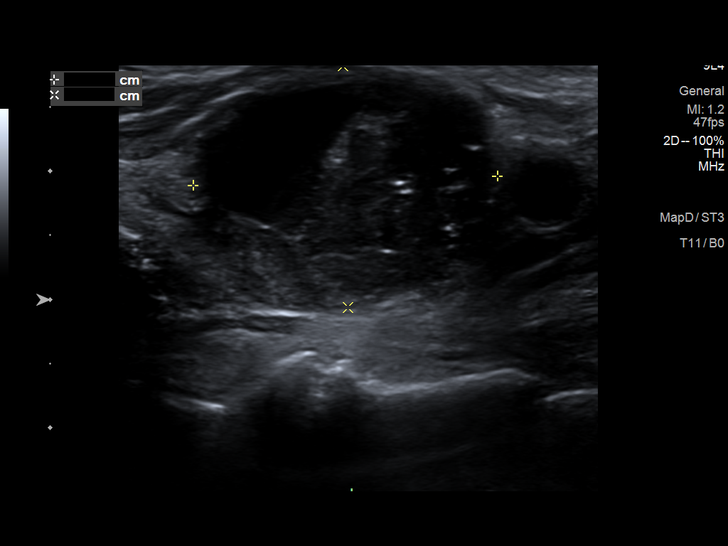
[im 4/12]
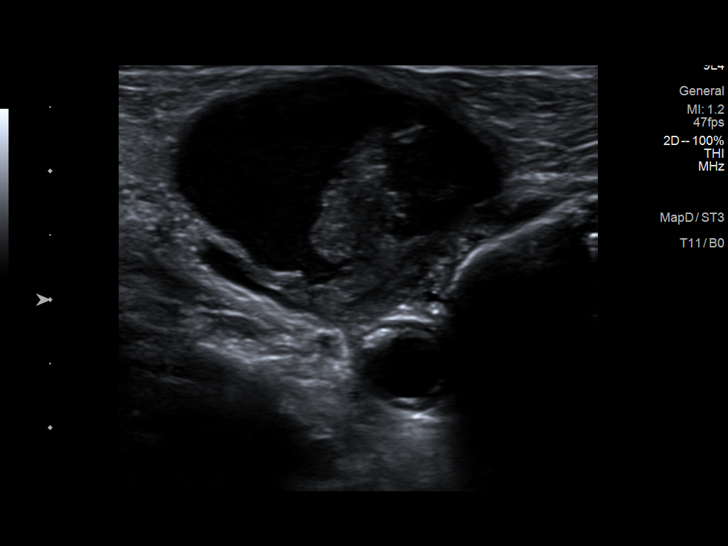
[im 5/12]
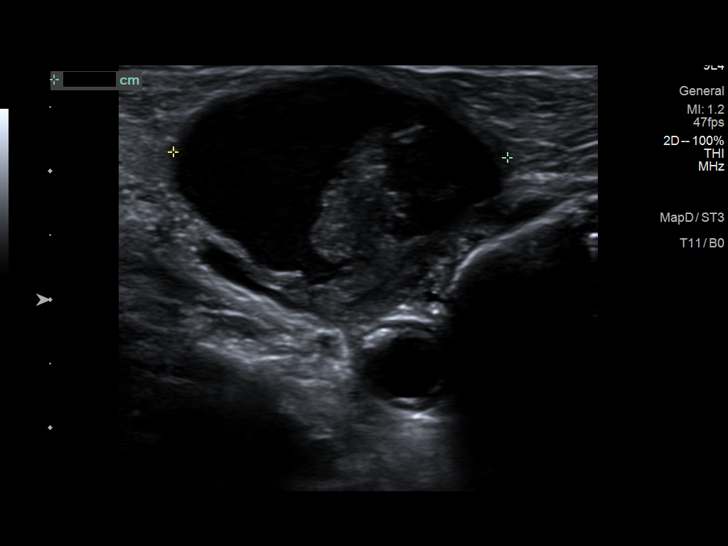
[im 6/12]
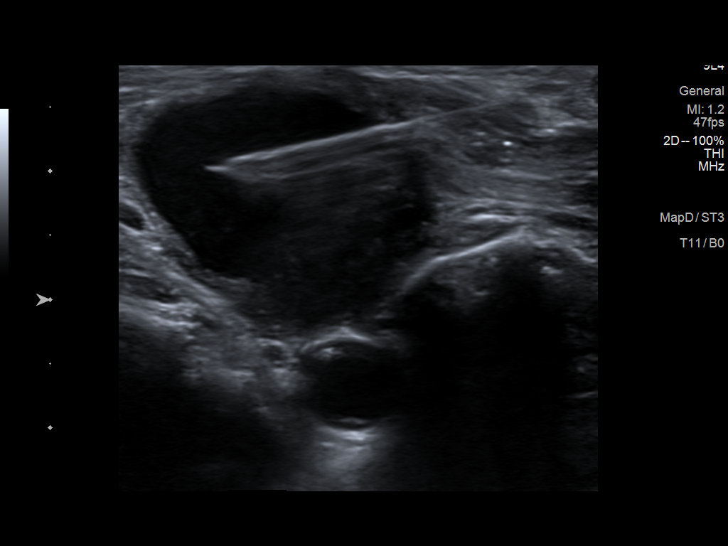
[im 7/12]
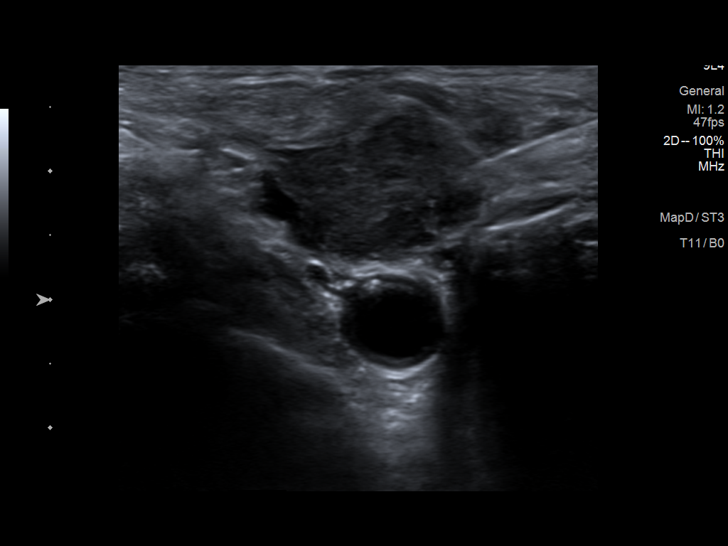
[im 8/12]
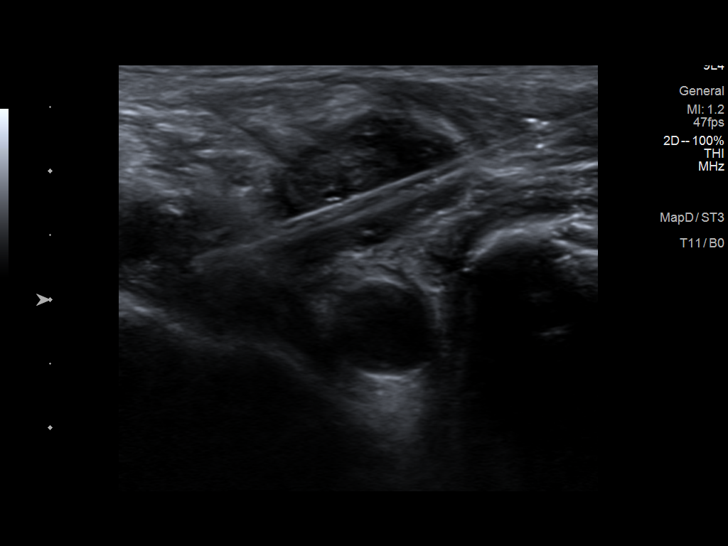
[im 9/12]
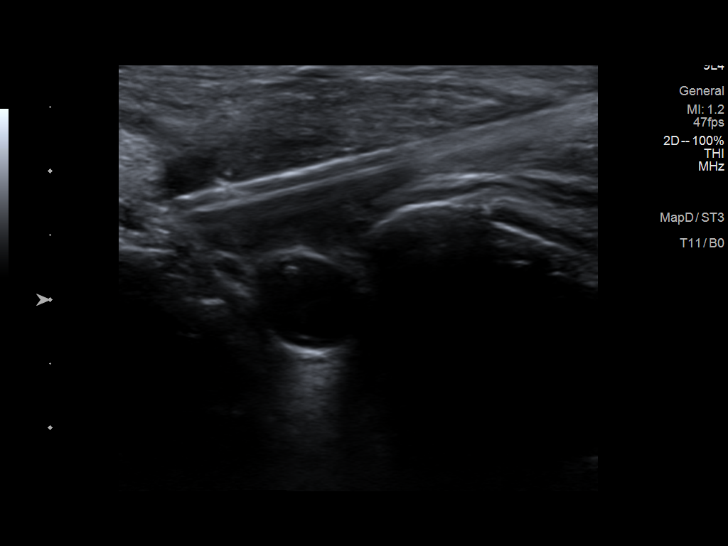
[im 10/12]
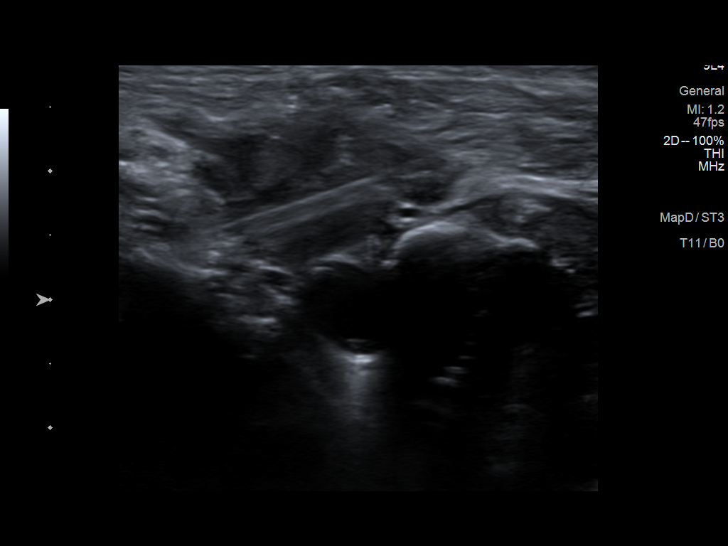
[im 11/12]
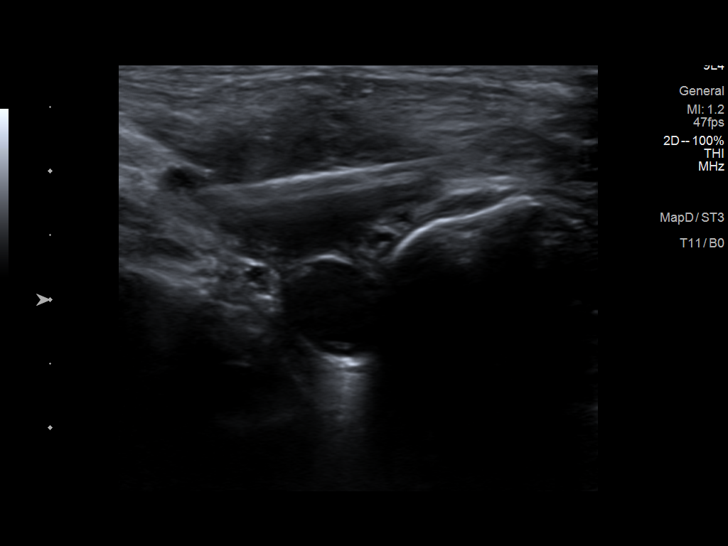
[im 12/12]
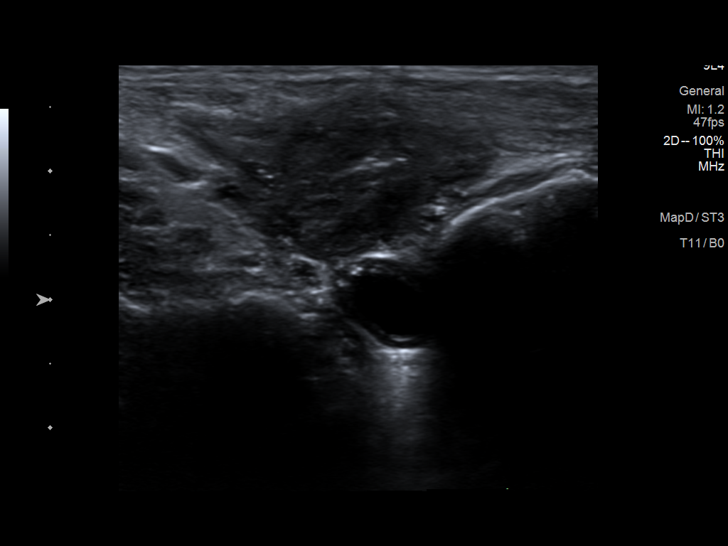

[12 of 12 positions shown; findings below may reference images not displayed]

EXAM:
Ultrasound aspiration of the right cervical necrotic adenopathy for
cytology

Ultrasound core biopsy of the right cervical necrotic adenopathy for
pathology

MEDICATIONS:
1% lidocaine local

ANESTHESIA/SEDATION:
Moderate (conscious) sedation was employed during this procedure. A
total of Versed 1.0 mg and Fentanyl 50 mcg was administered
intravenously.

Moderate Sedation Time: 12 minutes. The patient's level of
consciousness and vital signs were monitored continuously by
radiology nursing throughout the procedure under my direct
supervision.

FLUOROSCOPY TIME:  Fluoroscopy Time: None.

COMPLICATIONS:
None immediate.

PROCEDURE:
Informed written consent was obtained from the patient after a
thorough discussion of the procedural risks, benefits and
alternatives. All questions were addressed. Maximal Sterile Barrier
Technique was utilized including caps, mask, sterile gowns, sterile
gloves, sterile drape, hand hygiene and skin antiseptic. A timeout
was performed prior to the initiation of the procedure.

Previous imaging reviewed. Preliminary ultrasound performed. The
right cervical abnormal adenopathy was localized and marked.

Ultrasound aspiration: Under sterile conditions and local
anesthesia, an 18 gauge needle was advanced into the cystic necrotic
portion of the adenopathy. Syringe aspiration yielded 2 cc exudative
fluid. Sample sent for cytology. Needle removed.

Ultrasound core biopsy: Under sterile conditions and local
anesthesia, an 18 gauge core biopsy needle was advanced to the
residual component of the right cervical adenopathy. 18 gauge core
biopsies obtained. These were placed in formalin. Needle removed.
Postprocedure imaging demonstrates no hemorrhage or hematoma.
Patient tolerated biopsy well.
IMPRESSION: Successful ultrasound aspiration for cytology and core biopsy for
pathology of the right neck necrotic cervical adenopathy.

## 2019-06-11 MED ORDER — SODIUM CHLORIDE 0.9 % IV SOLN: INTRAVENOUS | Status: DC

## 2019-06-11 MED ORDER — FENTANYL CITRATE (PF) 100 MCG/2ML IJ SOLN
INTRAMUSCULAR | Status: AC
  Filled 2019-06-11: qty 2

## 2019-06-11 MED ORDER — MIDAZOLAM HCL 2 MG/2ML IJ SOLN
INTRAMUSCULAR | Status: AC
  Filled 2019-06-11: qty 2

## 2019-06-11 MED ORDER — MIDAZOLAM HCL 2 MG/2ML IJ SOLN
INTRAMUSCULAR | Status: AC | PRN
  Administered 2019-06-11 (×2): 0.5 mg via INTRAVENOUS

## 2019-06-11 MED ORDER — FENTANYL CITRATE (PF) 100 MCG/2ML IJ SOLN
INTRAMUSCULAR | Status: AC | PRN
  Administered 2019-06-11 (×2): 25 ug via INTRAVENOUS

## 2019-06-11 NOTE — Procedures (Signed)
Interventional Radiology Procedure Note  Procedure: s/p [place] ASP AND CORE BX RIGHT NECK NECROTIC ADENOPATHY  Complications: None  Estimated Blood Loss: MIN  Findings: 2CC ASP SENT FOR CYTO  18G CORES FOR PATH

## 2019-06-11 NOTE — H&P (Signed)
Chief Complaint: Patient was seen in consultation today for right neck LN bx at the request of [place]  Referring Physician(s): [place]  Supervising Physician: Daryll Brod  Patient Status: [facility] - Out-pt  History of Present Illness: Gregory Burton is a 79 y.o. male being worked up for right neck mass. Also found to have associated right cervical lymphadenopathy on PET. Referred for US guided LN bx PMHx, meds, labs, imaging, allergies reviewed. Feels well, no recent fevers, chills, illness. Has been NPO today as directed.    Past Medical History:  Diagnosis Date  . Alcohol abuse   . Blood transfusion without reported diagnosis 2013  . Cataract   . Glaucoma   . Gout   . Hypercholesteremia   . 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 BRAIN SURGERY  2012  . HERNIA REPAIR      Allergies: Enalapril  Medications: Prior to Admission medications   Medication Sig Start Date End Date Taking? Authorizing Provider  atorvastatin (LIPITOR) 20 MG tablet Take 20 mg by mouth daily.   Yes [provider]  donepezil (ARICEPT) 10 MG tablet Take 10 mg by mouth at bedtime.   Yes [provider]  dorzolamide-timolol (COSOPT) 22.3-6.8 MG/ML ophthalmic solution 1 drop 2 (two) times daily.   Yes [provider]  folic acid (FOLVITE) 1 MG tablet Take 1 mg by mouth daily.   Yes [provider]  latanoprost (XALATAN) 0.005 % ophthalmic solution 1 drop at bedtime.   Yes [provider]  predniSONE (DELTASONE) 5 MG tablet Take 5 mg by mouth daily with breakfast. As needed for gout flare   Yes [provider]  Thiamine HCl (VITAMIN B-1 PO) Take by mouth. 100 mg QD   Yes [provider]  Vitamin D, Cholecalciferol, 50 MCG (2000 UT) CAPS Take 1 capsule by mouth.   Yes [provider]     History reviewed. No pertinent family history.  Social History    Socioeconomic History  . Marital status: Married    Spouse name: Not on file  . Number of children: Not on file  . Years of education: Not on file  . Highest education level: Not on file  Occupational History  . Not on file  Tobacco Use  . Smoking status: Former Smoker    Years: 21.00    Types: Cigarettes    Quit date: 05/11/2008    Years since quitting: 11.0  . Smokeless tobacco: Never Used  Substance and Sexual Activity  . Alcohol use: Yes    Comment: 0.5 pint liquor a week  . Drug use: Never  . Sexual activity: Not Currently  Other Topics Concern  . Not on file  Social History Narrative  . Not on file   Social Determinants of Health   Financial Resource Strain:   . Difficulty of Paying Living Expenses:   Food Insecurity:   . Worried About Charity fundraiser in the Last Year:   . Arboriculturist in the Last Year:   Transportation Needs:   . Film/video editor (Medical):   Marland Kitchen Lack of Transportation (Non-Medical):   Physical Activity:   . Days of Exercise per Week:   . Minutes of Exercise per Session:   Stress:   . Feeling of Stress :   Social Connections:   . Frequency of Communication with Friends and Family:   . Frequency of Social Gatherings with Friends and Family:   .  Attends Religious Services:   . Active Member of Clubs or Organizations:   . Attends Archivist Meetings:   Marland Kitchen Marital Status:     Review of Systems: A 12 point ROS discussed and pertinent positives are indicated in the HPI above.  All other systems are negative.  Review of Systems  Vital Signs: BP (!) 150/77   Pulse 60   Temp 97.7 F (36.5 C) (Oral)   Resp 16   Ht 5\' 7"  (1.702 m)   Wt 51.3 kg   SpO2 98%   BMI 17.71 kg/m   Physical Exam Constitutional:      Appearance: Normal appearance. He is not ill-appearing.  HENT:     Head: Normocephalic.     Mouth/Throat:     Mouth: Mucous membranes are moist.     Pharynx: Oropharynx is clear.  Neck:     Comments:  Palpable right neck nodule/LN, NT Cardiovascular:     Rate and Rhythm: Normal rate and regular rhythm.     Heart sounds: Normal heart sounds.  Pulmonary:     Effort: Pulmonary effort is normal. No respiratory distress.     Breath sounds: Normal breath sounds.  Musculoskeletal:     Cervical back: Normal range of motion.  Skin:    General: Skin is warm and dry.  Neurological:     General: No focal deficit present.     Mental Status: He is alert and oriented to person, place, and time.  Psychiatric:        Mood and Affect: Mood normal.        Thought Content: Thought content normal.        Judgment: Judgment normal.     Imaging: CT SOFT TISSUE NECK W CONTRAST  Result Date: 05/27/2019 CLINICAL DATA:  Right neck mass EXAM: CT NECK WITH CONTRAST TECHNIQUE: Multidetector CT imaging of the neck was performed using the standard protocol following the bolus administration of intravenous contrast. CONTRAST:  6mL OMNIPAQUE IOHEXOL 300 MG/ML  SOLN COMPARISON:  None. FINDINGS: Pharynx and larynx: Enhancing mass in the right piriform sinus measuring 17 mm in diameter. Appearance is concerning for neoplasm. Possible extension into the base of the epiglottis on the right. Base of tongue normal. No airway compromise. Larynx normal. Salivary glands: No inflammation, mass, or stone. Thyroid: Negative Lymph nodes: Cystic mass in the right lateral neck at the level of the piriform sinus. Cyst measures 15 x 24 mm and is deep to the sternocleidomastoid muscle. There is mild enhancement of the wall the cyst which is likely a necrotic lymph node. Cystic mass in the retropharyngeal soft tissues on the right in the region of the nasopharynx. This measures approximately 18 mm in diameter and likely is a necrotic lymph node. Cluster of lymph nodes in the right posterior neck measuring up to 12 mm in diameter. These are also likely malignant lymph nodes. Vascular: Normal vascular enhancement. Atherosclerotic calcification  is present in the aortic arch and carotid bifurcation. 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 Posterior lymph node on the right appears to extend into the right jugular vein without occlusion. Limited intracranial: Negative Visualized orbits: Negative Mastoids and visualized paranasal sinuses: Mild mucosal edema in the ethmoid sinuses. Remaining sinuses clear. Mastoid clear bilaterally. Skeleton: Cervical spondylosis. Thoracic scoliosis. No acute skeletal abnormality. Right lower molar extraction site without complication. Upper chest:  10 mm spiculated nodule left upper lobe with central necrosis. Right apex clear. Other: None IMPRESSION: 1. Solid enhancing mass in the right piriform sinus measuring 17 mm, highly suspicious for carcinoma. 2. Multiple lymph nodes in the right neck including cystic nodes in the right posterior nasopharynx and right lateral neck. Posterior lymph nodes in the right neck also likely malignant, 1 of which extends into the right jugular vein. Tissue sampling and direct laryngoscopy recommended to evaluate for neoplasm. 3. 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 PET-CT would be helpful for further evaluation and staging. 4. Atherosclerotic disease. 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 This appears to be a flow limiting lesion with decreased caliber of the right internal carotid artery which is patent. Electronically Signed   By: Franchot Gallo M.D.   On: 05/27/2019 09:47   NM PET Image Initial (PI) Skull Base To Thigh  Result Date: 06/07/2019 CLINICAL DATA:  Initial treatment strategy for neck mass. EXAM: NUCLEAR MEDICINE PET SKULL BASE TO THIGH TECHNIQUE: 6.57 mCi F-18 FDG was injected intravenously. Full-ring PET imaging was performed from the skull base to thigh after the  radiotracer. CT data was obtained and used for attenuation correction and anatomic localization. Fasting blood glucose: The 56 mg/dl COMPARISON:  CT 05/27/2019 FINDINGS: Mediastinal blood pool activity: SUV max 2.39 Liver activity: SUV max NA NECK: Base of tongue mass extending into the right peer form sinus is identified. This has an SUV max of 12.69. Multiple right-sided FDG avid lymph nodes are identified. Index right level 2 node within the measures 2.5 cm within SUV max of 5.7. Right level 2 node posterior and medial to the parotid gland measures 2.2 cm within SUV max of 9.6. Right FDG avid node at the level of the piriform sinus measures 2.2 cm within SUV max of 5.8. Incidental CT findings: none CHEST: No FDG avid axillary, supraclavicular, mediastinal, or hilar lymph nodes. Within the posterolateral left upper lobe there is a nodule with central cavitary component measuring 1 cm. This has an SUV max of 1.8, image 89/3. Incidental CT findings: A few scattered calcified pleural plaques are noted suggesting previous asbestos exposure. Aortic atherosclerosis. Three vessel coronary artery atherosclerotic calcifications. ABDOMEN/PELVIS: No abnormal hypermetabolic activity within the liver, pancreas, adrenal glands, or spleen. No hypermetabolic lymph nodes in the abdomen or pelvis. Incidental CT findings: Aortic atherosclerosis. Seed implants noted within the prostate gland. SKELETON: No focal hypermetabolic activity to suggest skeletal metastasis. Incidental CT findings: none IMPRESSION: 1. 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 2. Multiple FDG avid cervical lymph nodes compatible with ipsilateral nodal metastasis. No FDG avid left cervical lymph nodes identified. 3. Left upper lobe pulmonary nodule exhibits mild FDG uptake within SUV max of 1.8. Cannot rule out pulmonary metastasis. 4. 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 No findings of pulmonary asbestosis. 5. Coronary artery calcifications 6.  Aortic Atherosclerosis (ICD10-I70.0). Electronically Signed   By: Kerby Moors M.D.   On: 06/07/2019 13:10    Labs:  CBC: Recent Labs    05/12/19 1603  WBC 5.8  HGB 12.7*  HCT 39.5  PLT 222    COAGS: Recent Labs    05/12/19 1603 05/28/19 1326  INR 1.1 1.1  APTT 31 33    BMP: Recent Labs    05/12/19 1603  NA 139  K 4.0  CL 103  CO2 28  GLUCOSE 79  BUN 14  CALCIUM 9.4  CREATININE 0.63  GFRNONAA >60  GFRAA >60    LIVER FUNCTION TESTS: Recent Labs    05/12/19 1603  BILITOT 1.2  AST 20  ALT 16  ALKPHOS 57  PROT 7.7  ALBUMIN 4.0    TUMOR MARKERS: No results for input(s): AFPTM, CEA, CA199, CHROMGRNA in the last 8760 hours.  Assessment and Plan: Reck neck mass with cervical LN For US guided biopsy Labs reviewed. Risks and benefits of LN bx was discussed with the patient and/or patient's family including, but not limited to bleeding, infection, damage to adjacent structures or low yield requiring additional tests.  All of the questions were answered and there is agreement to proceed.  Consent signed and in chart.     Thank you for this interesting consult.  I greatly enjoyed meeting [employer] and look forward to participating in their care.  A copy of this report was sent to the requesting provider on this date.  Electronically Signed: Ascencion Dike, PA-C 06/11/2019, 10:42 AM   I spent a total of 20 minutes in face to face in clinical consultation, greater than 50% of which was counseling/coordinating care for LN bx

## 2019-06-11 NOTE — Discharge Instructions (Signed)

## 2019-06-11 NOTE — Telephone Encounter (Signed)
Done.. Pt has been scheduled as requested. I'll try to contact him later today after his scheduled BX to make him aware of his sched F/U visit on 06/22/19 @ 915. A reminder letter will be mailed out also.

## 2019-06-11 NOTE — Telephone Encounter (Signed)
-----   Message from Earlie Server, MD sent at 06/11/2019  8:30 AM EDT ----- Please check if he has seen by ENT and scheduled for biopsy. I will see him 1 week after biopsy.  ----- Message ----- From: Interface, Rad Results In Sent: 06/07/2019   1:12 PM EDT To: Earlie Server, MD

## 2019-06-11 NOTE — Telephone Encounter (Signed)
Patient is scheduled for biopsy today.  Please schedule f/u with MD in 1 week or early the following week.

## 2019-06-11 NOTE — Progress Notes (Signed)
Patient clinically stable post biopsy, tolerated well with stable vitals throughout, awake/alert and oriented post procedure. bandade dressing dry and intact, report given to [employer] with questions answered. Denies complaints at this time. Wife brought back to room with discharge instructions given,received Versed 1mg  along with Fentanyl 70mcg IV for procedure.

## 2019-06-14 ENCOUNTER — Other Ambulatory Visit: Payer: Self-pay | Admitting: Unknown Physician Specialty

## 2019-06-14 LAB — CYTOLOGY - NON PAP

## 2019-06-15 LAB — SURGICAL PATHOLOGY

## 2019-06-17 ENCOUNTER — Other Ambulatory Visit: Payer: Self-pay

## 2019-06-17 DIAGNOSIS — R221 Localized swelling, mass and lump, neck: Secondary | ICD-10-CM

## 2019-06-22 ENCOUNTER — Other Ambulatory Visit: Payer: Self-pay

## 2019-06-22 ENCOUNTER — Encounter (INDEPENDENT_AMBULATORY_CARE_PROVIDER_SITE_OTHER): Payer: Self-pay

## 2019-06-22 ENCOUNTER — Inpatient Hospital Stay (HOSPITAL_BASED_OUTPATIENT_CLINIC_OR_DEPARTMENT_OTHER): Payer: Medicare Other | Admitting: Oncology

## 2019-06-22 ENCOUNTER — Encounter: Payer: Self-pay | Admitting: Oncology

## 2019-06-22 VITALS — BP 120/79 | HR 61 | Temp 97.5°F | Resp 16 | Wt 111.9 lb

## 2019-06-22 DIAGNOSIS — Z0110 Encounter for examination of ears and hearing without abnormal findings: Secondary | ICD-10-CM | POA: Diagnosis not present

## 2019-06-22 DIAGNOSIS — R911 Solitary pulmonary nodule: Secondary | ICD-10-CM

## 2019-06-22 DIAGNOSIS — C109 Malignant neoplasm of oropharynx, unspecified: Secondary | ICD-10-CM | POA: Diagnosis not present

## 2019-06-22 DIAGNOSIS — Z7189 Other specified counseling: Secondary | ICD-10-CM

## 2019-06-22 NOTE — Progress Notes (Addendum)
Hematology/Oncology Consult note [facility] Telephone:([phone] [phone]   Patient Care Team: Casilda Carls, MD as PCP - General (Internal Medicine)  REFERRING PROVIDER: Casilda Carls, [name]  HISTORY OF PRESENTING ILLNESS:   Gregory Burton is a  79 y.o.  male with PMH listed below was seen in consultation at the request of  Casilda Carls, MD  for evaluation of lymph node.  Patient moved from [place] to [place] for about 3 weeks. He has noticed a neck knot on the right side of his neck. The knot is sore and makes him to cough and he feels it when he swallows. No swallowing difficulty.  Patient was accompanied by his wife. She reports that patient's previous PCP has done neck sonogram for evaluation.  Patient also had right lower molar tooth extraction done a few weeks before he noticed  He also took a course of antibiotics.  Due to his tooth pain, his appetite has decreased and he has lost weight loss, about 20 pounds, he can not specify the time frame.   Alcoholism History of prostate cancer. S/p Radiation/seed, he follows up with [facility].   INTERVAL HISTORY Gregory Burton is a 79 y.o. 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  Patient presents for discussion of pathology report and management plan.  Review of Systems  Constitutional: Positive for appetite change, fatigue and unexpected weight change. Negative for chills and fever.  HENT:   Negative for hearing loss and voice change.   Eyes: Negative for eye problems and icterus.  Respiratory: Negative for chest tightness, cough and shortness of breath.   Cardiovascular: Negative for chest pain and leg swelling.  Gastrointestinal: Negative  for abdominal distention and abdominal pain.  Endocrine: Negative for hot flashes.  Genitourinary: Negative for difficulty urinating, dysuria and frequency.   Musculoskeletal: Negative for arthralgias.  Skin: Negative for itching and rash.  Neurological: Negative for light-headedness and numbness.  Hematological: Negative for adenopathy. Does not bruise/bleed easily.  Psychiatric/Behavioral: Negative for confusion.    MEDICAL HISTORY:  Past Medical History:  Diagnosis Date  . Alcohol abuse   . Blood transfusion without reported diagnosis 2013  . Cataract   . Glaucoma   . Gout   . Hypercholesteremia   . Prostate CA [facility]) 2008    SURGICAL HISTORY: Past Surgical History:  Procedure Laterality Date  . BRAIN SURGERY  2012  . HERNIA REPAIR      SOCIAL HISTORY: Social History   Socioeconomic History  . Marital status: Married    Spouse name: Not on file  . Number of children: Not on file  . Years of education: Not on file  . Highest education level: Not on file  Occupational History  . Not on file  Tobacco Use  . Smoking status: Former Smoker    Years: 21.00    Types: Cigarettes    Quit date: 05/11/2008    Years since quitting: 11.1  . Smokeless tobacco: Never Used  Substance and Sexual Activity  . Alcohol use: Yes    Comment: 0.5 pint liquor a week  . Drug use: Never  . Sexual activity: Not Currently  Other Topics Concern  . Not on file  Social History Narrative  . Not on  file   Social Determinants of Health   Financial Resource Strain:   . Difficulty of Paying Living Expenses:   Food Insecurity:   . Worried About Charity fundraiser in the Last Year:   . Arboriculturist in the Last Year:   Transportation Needs:   . Film/video editor (Medical):   Marland Kitchen Lack of Transportation (Non-Medical):   Physical Activity:   . Days of Exercise per Week:   . Minutes of Exercise per Session:   Stress:   . Feeling of Stress :   Social Connections:   . Frequency of  Communication with Friends and Family:   . Frequency of Social Gatherings with Friends and Family:   . Attends Religious Services:   . Active Member of Clubs or Organizations:   . Attends Archivist Meetings:   Marland Kitchen Marital Status:   Intimate Partner Violence:   . Fear of Current or Ex-Partner:   . Emotionally Abused:   Marland Kitchen Physically Abused:   . Sexually Abused:     FAMILY HISTORY: History reviewed. No pertinent family history.  ALLERGIES:  is allergic to enalapril.  MEDICATIONS:  Current Outpatient Medications  Medication Sig Dispense Refill  . atorvastatin (LIPITOR) 20 MG tablet Take 20 mg by mouth daily.    Marland Kitchen donepezil (ARICEPT) 10 MG tablet Take 10 mg by mouth at bedtime.    . dorzolamide-timolol (COSOPT) 22.3-6.8 MG/ML ophthalmic solution 1 drop 2 (two) times daily.    . folic acid (FOLVITE) 1 MG tablet Take 1 mg by mouth daily.    Marland Kitchen latanoprost (XALATAN) 0.005 % ophthalmic solution 1 drop at bedtime.    . predniSONE (DELTASONE) 5 MG tablet Take 5 mg by mouth daily with breakfast. As needed for gout flare    . Thiamine HCl (VITAMIN B-1 Burton) Take by mouth. 100 mg QD    . Vitamin D, Cholecalciferol, 50 MCG (2000 UT) CAPS Take 1 capsule by mouth.     No current facility-administered medications for this visit.     PHYSICAL EXAMINATION: ECOG PERFORMANCE STATUS: 1 - Symptomatic but completely ambulatory Vitals:   06/22/19 0902  BP: 120/79  Pulse: 61  Resp: 16  Temp: (!) 97.5 F (36.4 C)   Filed Weights   06/22/19 0902  Weight: 111 lb 14.4 oz (50.8 kg)    Physical Exam Constitutional:      General: He is not in acute distress. HENT:     Head: Normocephalic and atraumatic.  Eyes:     General: No scleral icterus. Neck:     Comments: Palpable 1-2cm right neck mass  Cardiovascular:     Rate and Rhythm: Normal rate and regular rhythm.     Heart sounds: Normal heart sounds.  Pulmonary:     Effort: Pulmonary effort is normal. No respiratory distress.      Breath sounds: No wheezing.  Abdominal:     General: Bowel sounds are normal. There is no distension.     Palpations: Abdomen is soft.  Musculoskeletal:        General: No deformity. Normal range of motion.     Cervical back: Normal range of motion and neck supple.  Skin:    General: Skin is warm and dry.     Findings: No erythema or rash.  Neurological:     Mental Status: He is alert and oriented to person, place, and time. Mental status is at baseline.     Cranial Nerves: No cranial nerve deficit.  Coordination: Coordination normal.  Psychiatric:        Mood and Affect: Mood normal.     LABORATORY DATA:  I have reviewed the data as listed Lab Results  Component Value Date   WBC 5.8 05/12/2019   HGB 12.7 (L) 05/12/2019   HCT 39.5 05/12/2019   MCV 93.8 05/12/2019   PLT 222 05/12/2019   Recent Labs    05/12/19 1603  NA 139  K 4.0  CL 103  CO2 28  GLUCOSE 79  BUN 14  CREATININE 0.63  CALCIUM 9.4  GFRNONAA >60  GFRAA >60  PROT 7.7  ALBUMIN 4.0  AST 20  ALT 16  ALKPHOS 57  BILITOT 1.2   Iron/TIBC/Ferritin/ %Sat No results found for: IRON, TIBC, FERRITIN, IRONPCTSAT    RADIOGRAPHIC STUDIES: I have personally reviewed the radiological images as listed and agreed with the findings in the report. CT SOFT TISSUE NECK W CONTRAST  Result Date: 05/27/2019 CLINICAL DATA:  Right neck mass EXAM: CT NECK WITH CONTRAST TECHNIQUE: Multidetector CT imaging of the neck was performed using the standard protocol following the bolus administration of intravenous contrast. CONTRAST:  27mL OMNIPAQUE IOHEXOL 300 MG/ML  SOLN COMPARISON:  None. FINDINGS: Pharynx and larynx: Enhancing mass in the right piriform sinus measuring 17 mm in diameter. Appearance is concerning for neoplasm. Possible extension into the base of the epiglottis on the right. Base of tongue normal. No airway compromise. Larynx normal. Salivary glands: No inflammation, mass, or stone. Thyroid: Negative Lymph nodes:  Cystic mass in the right lateral neck at the level of the piriform sinus. Cyst measures 15 x 24 mm and is deep to the sternocleidomastoid muscle. There is mild enhancement of the wall the cyst which is likely a necrotic lymph node. Cystic mass in the retropharyngeal soft tissues on the right in the region of the nasopharynx. This measures approximately 18 mm in diameter and likely is a necrotic lymph node. Cluster of lymph nodes in the right posterior neck measuring up to 12 mm in diameter. These are also likely malignant lymph nodes. Vascular: Normal vascular enhancement. Atherosclerotic calcification is present in the aortic arch and carotid bifurcation. 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 Posterior lymph node on the right appears to extend into the right jugular vein without occlusion. Limited intracranial: Negative Visualized orbits: Negative Mastoids and visualized paranasal sinuses: Mild mucosal edema in the ethmoid sinuses. Remaining sinuses clear. Mastoid clear bilaterally. Skeleton: Cervical spondylosis. Thoracic scoliosis. No acute skeletal abnormality. Right lower molar extraction site without complication. Upper chest: 10 mm spiculated nodule left upper lobe with central necrosis. Right apex clear. Other: None IMPRESSION: 1. Solid enhancing mass in the right piriform sinus measuring 17 mm, highly suspicious for carcinoma. 2. Multiple lymph nodes in the right neck including cystic nodes in the right posterior nasopharynx and right lateral neck. Posterior lymph nodes in the right neck also likely malignant, 1 of which extends into the right jugular vein. Tissue sampling and direct laryngoscopy recommended to evaluate for neoplasm. 3. 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 PET-CT would be helpful for further evaluation and staging.  4. Atherosclerotic disease. 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 This appears to be a flow limiting lesion with decreased caliber of the right internal carotid artery which is patent. Electronically Signed   By:  Franchot Gallo M.D.   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 A total of Versed 1.0 mg and Fentanyl 50 mcg was administered intravenously. Moderate Sedation Time: 12 minutes. The patient's level of consciousness and vital signs were monitored continuously by radiology nursing throughout the procedure under my direct supervision. FLUOROSCOPY TIME:  Fluoroscopy Time: None. COMPLICATIONS: None immediate. PROCEDURE: Informed written consent was obtained from the patient after a thorough discussion of the procedural risks, benefits and alternatives. All questions were addressed. Maximal Sterile Barrier Technique was utilized including caps, mask, sterile gowns, sterile gloves, sterile drape, hand hygiene and skin antiseptic. A timeout was performed prior to the initiation of the procedure. Previous imaging reviewed. Preliminary ultrasound performed. The right cervical abnormal adenopathy was localized and marked. Ultrasound aspiration: Under sterile conditions and local anesthesia, an 18 gauge needle was advanced into the cystic necrotic portion of the adenopathy. Syringe aspiration yielded 2 cc exudative fluid. Sample sent for cytology. Needle removed. Ultrasound core biopsy: Under sterile conditions and local anesthesia, an 18 gauge core biopsy needle  was advanced to the residual component of the right cervical adenopathy. 18 gauge core biopsies obtained. These were placed in formalin. Needle removed. Postprocedure imaging demonstrates no hemorrhage or hematoma. Patient tolerated biopsy well. IMPRESSION: Successful ultrasound aspiration for cytology and core biopsy for pathology of the right neck necrotic cervical adenopathy. Electronically Signed   By: Jerilynn Mages.  Shick M.D.   On: 06/11/2019 13:10   NM PET Image Initial (PI) Skull Base To Thigh  Result Date: 06/07/2019 CLINICAL DATA:  Initial treatment strategy for neck mass. EXAM: NUCLEAR MEDICINE PET SKULL BASE TO THIGH TECHNIQUE: 6.57 mCi F-18 FDG was injected intravenously. Full-ring PET imaging was performed from the skull base to thigh after the radiotracer. CT data was obtained and used for attenuation correction and anatomic localization. Fasting blood glucose: The 56 mg/dl COMPARISON:  CT 05/27/2019 FINDINGS: Mediastinal blood pool activity: SUV max 2.39 Liver activity: SUV max NA NECK: Base of tongue mass extending into the right peer form sinus is identified. This has an SUV max of 12.69. Multiple right-sided FDG avid lymph nodes are identified. Index right level 2 node within the measures 2.5 cm within SUV max of 5.7. Right level 2 node posterior and medial to the parotid gland measures 2.2 cm within SUV max of 9.6. Right FDG avid node at the level of the piriform sinus measures 2.2 cm within SUV max of 5.8. Incidental CT findings: none CHEST: No FDG avid axillary, supraclavicular, mediastinal, or hilar lymph nodes. Within the posterolateral left upper lobe there is a nodule with central cavitary component measuring 1 cm. This has an SUV max of 1.8, image 89/3. Incidental CT findings: A few scattered calcified pleural plaques are noted suggesting previous asbestos exposure. Aortic atherosclerosis. Three vessel coronary artery atherosclerotic calcifications. ABDOMEN/PELVIS: No abnormal hypermetabolic  activity within the liver, pancreas, adrenal glands, or spleen. No hypermetabolic lymph nodes in the abdomen or pelvis. Incidental CT findings: Aortic atherosclerosis. Seed implants noted within the prostate gland. SKELETON: No focal hypermetabolic activity to suggest skeletal metastasis. Incidental CT findings: none IMPRESSION: 1. Right base of tongue mass extending into the right piriform sinus is  FDG avid compatible with primary head neck carcinoma. 2. Multiple FDG avid cervical lymph nodes compatible with ipsilateral nodal metastasis. No FDG avid left cervical lymph nodes identified. 3. Left upper lobe pulmonary nodule exhibits mild FDG uptake within SUV max of 1.8. Cannot rule out pulmonary metastasis. 4. 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 No findings of pulmonary asbestosis. 5. Coronary artery calcifications 6.  Aortic Atherosclerosis (ICD10-I70.0). Electronically Signed   By: Kerby Moors M.D.   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 A total of Versed 1.0 mg and Fentanyl 50 mcg was administered intravenously. Moderate Sedation Time: 12 minutes. The patient's level of consciousness and vital signs were monitored continuously by radiology nursing throughout the procedure under my direct supervision. FLUOROSCOPY TIME:  Fluoroscopy Time: None. COMPLICATIONS: None immediate. PROCEDURE: Informed written consent was obtained from the patient after a thorough discussion of the procedural risks, benefits and alternatives. All questions were addressed.  Maximal Sterile Barrier Technique was utilized including caps, mask, sterile gowns, sterile gloves, sterile drape, hand hygiene and skin antiseptic. A timeout was performed prior to the initiation of the procedure. Previous imaging reviewed. Preliminary ultrasound performed. The right cervical abnormal adenopathy was localized and marked. Ultrasound aspiration: Under sterile conditions and local anesthesia, an 18 gauge needle was advanced into the cystic necrotic portion of the adenopathy. Syringe aspiration yielded 2 cc exudative fluid. Sample sent for cytology. Needle removed. Ultrasound core biopsy: Under sterile conditions and local anesthesia, an 18 gauge core biopsy needle was advanced to the residual component of the right cervical adenopathy. 18 gauge core biopsies obtained. These were placed in formalin. Needle removed. Postprocedure imaging demonstrates no hemorrhage or hematoma. Patient tolerated biopsy well. IMPRESSION: Successful ultrasound aspiration for cytology and core biopsy for pathology of the right neck necrotic cervical adenopathy. Electronically Signed   By: Jerilynn Mages.  Shick M.D.   On: 06/11/2019 13:10      ASSESSMENT & PLAN:  1. Lung nodule   2. Encounter for audiology evaluation   3. 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 Patient's case was also discussed on multidisciplinary tumor board. Consensus reached on concurrent chemoradiation. Patient will be referred to radiation oncology for evaluation. I recommend cisplatin weekly as  concurrent chemotherapy. -New left upper lobe pulmonary nodule with central cavity which exhibits mild FDG uptake.  Cannot rule out pulmonary metastasis versus primary lung cancer.  Tumor board recommendation is to proceed with CT-guided lung nodule biopsy.  The diagnosis and care plan were discussed with patient in detail.  NCCN guidelines were reviewed and shared with patient. Chemotherapy education was provided.  We had discussed the composition of chemotherapy regimen, length of chemo cycle, duration of treatment and the time to assess response to treatment.   I  explained to the patient the risks and benefits of chemotherapy cisplatin including all but not limited to hearing loss, hair loss, mouth sore, nausea, vomiting, diarrhea, kidney failure, low blood counts, bleeding, neuropathy, and risk of life threatening infection and even death, secondary malignancy etc.  . Patient voices understanding and willing to proceed chemotherapy.  We discussed about Mediport placement as an option but not required. Per wife, patient does not have good IV access.  They will consider the option of Mediport and update me.  # Chemotherapy education; check baseline hearing test.  Antiemetics-Zofran and Compazine; EMLA cream sent to pharmacy Supportive care measures are necessary for patient well-being and will be provided as necessary. We spent sufficient time to discuss many aspect of care, questions were answered to patient's satisfaction.   alcoholism, I discussed with patient about alcohol cessation.  Adequate folate and vitamin B12 level. Patient has also seen by nutritionist.  Patient has low BMI at 69, history of alcohol use, at risk of developing malnutrition after he starts radiation.  I discussed with patient and wife about option of feeding tube. Patient and wife want me to further discuss with patient's son.  I called patient's son Jesse Fall at [phone] and had a lengthy discussion with him.  I  updated him about patient's diagnosis, extent of disease, diagnosis of head and neck cancer, lung nodule that need to be further worked up,  Recommended treatment options including concurrent chemo therapy and radiation.  I discussed with patient about the rationale of using chemotherapy cisplatin as chemo sensitizer along with radiation.  Side effects were discussed with son in details.  Son informed me that patient has concerns about getting chemotherapy. I discussed about other option of concurrent radiation with cetuximab and possible life-threatening anaphylactic reaction and other side effects were discussed.  I encourage son to further discuss with patient and update me if he wants to switch to cetuximab treatments.  I will have to obtain alpha-gal prior to the cetuximab treatments. I also discussed about the rationale of feeding tube which is to further improve his nutrition status when he is on radiation treatments.  It is very likely that his nutritional status will further decrease during the treatments. Son will further discuss with patient and I encouraged him to update me ASAP.  Patient will need radiation oncologist next week.   Orders Placed This Encounter  Procedures  . CT Biopsy    Standing Status:   Future    Standing Expiration Date:   06/21/2020    Order Specific Question:   Lab orders requested (DO NOT place separate lab orders, these will be automatically ordered during procedure specimen collection):    Answer:   Surgical Pathology    Order Specific Question:   Reason for Exam (SYMPTOM  OR DIAGNOSIS REQUIRED)    Answer:   lung nodule biopsy (discussed on tumor board with Dr. Kathlene Cote)    Order Specific Question:   Preferred location?    Answer:   [facility]    Order Specific Question:   Radiology Contrast Protocol - do NOT remove file path    Answer:   [email or web address]  . Ambulatory referral to ENT    Referral Priority:   Routine     Referral Type:   Consultation    Referral Reason:   Specialty Services Required    Requested Specialty:   Otolaryngology    Number of Visits Requested:   1    All questions were answered. The patient knows  to call the clinic with any problems questions or concerns.   Return of visit: To be determined.  Will start chemotherapy when patient is started on radiation. cc Casilda Carls, MD    Earlie Server, MD, PhD Hematology Oncology [facility] at [facility] Pager- [phone] 06/22/2019

## 2019-06-22 NOTE — Progress Notes (Signed)
Here to discuss biopsy results.

## 2019-06-23 DIAGNOSIS — R911 Solitary pulmonary nodule: Secondary | ICD-10-CM | POA: Insufficient documentation

## 2019-06-23 DIAGNOSIS — C3490 Malignant neoplasm of unspecified part of unspecified bronchus or lung: Secondary | ICD-10-CM

## 2019-06-23 DIAGNOSIS — Z0110 Encounter for examination of ears and hearing without abnormal findings: Secondary | ICD-10-CM | POA: Insufficient documentation

## 2019-06-23 DIAGNOSIS — Z7189 Other specified counseling: Secondary | ICD-10-CM | POA: Insufficient documentation

## 2019-06-23 HISTORY — DX: Malignant neoplasm of unspecified part of unspecified bronchus or lung: C34.90

## 2019-06-23 NOTE — Progress Notes (Signed)
START ON PATHWAY REGIMEN - Head and Neck     A cycle is every 7 days:     Cisplatin   **Always confirm dose/schedule in your pharmacy ordering system**  Patient Characteristics: Oropharynx, HPV Negative/Unknown, Clinically Staged, Stage IV Disease Classification: Oropharynx HPV Status: Negative (-) Current Disease Status: No Distant Metastases and No Recurrent Disease AJCC T Category: T4b AJCC 8 Stage Grouping: IVB AJCC N Category: cN2b AJCC M Category: M0 Intent of Therapy: Curative Intent, Discussed with Patient

## 2019-06-23 NOTE — Patient Instructions (Signed)

## 2019-06-24 ENCOUNTER — Ambulatory Visit (HOSPITAL_BASED_OUTPATIENT_CLINIC_OR_DEPARTMENT_OTHER): Payer: Medicare Other | Admitting: Oncology

## 2019-06-24 ENCOUNTER — Inpatient Hospital Stay: Payer: Medicare Other

## 2019-06-24 ENCOUNTER — Other Ambulatory Visit: Payer: Self-pay

## 2019-06-24 ENCOUNTER — Ambulatory Visit: Payer: Medicare Other

## 2019-06-24 ENCOUNTER — Telehealth: Payer: Self-pay

## 2019-06-24 DIAGNOSIS — I6529 Occlusion and stenosis of unspecified carotid artery: Secondary | ICD-10-CM

## 2019-06-24 DIAGNOSIS — C109 Malignant neoplasm of oropharynx, unspecified: Secondary | ICD-10-CM

## 2019-06-24 NOTE — Telephone Encounter (Signed)
Patient has been scheduled for COVID testing at pre-admit testing site for 4/30 from 8 am to 1 pm.  Wife was informed of the need for COVID testing results prior to CT guided lung bx on 5/4.  She verbalized understanding and repeated testing site instructions back to nurse.

## 2019-06-24 NOTE — Progress Notes (Addendum)
Nutrition Assessment   Reason for Assessment:  Patient identified on Malnutrition Screening tool for weight loss and poor appetite.   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  Past medical history of gout, hypercholesterolemia, prostate cancer, etoh use.  Patient followed by [name].  Noted planning concurrent chemotherapy and radiation therapy.    Add on in clinic.    Patient in clinic for chemo class and chemocare clinic.  Met with patient following these events briefly as Lucianne Lei waiting to take patient and wife home.  Patient reports weight loss of 20 lb over "awhile", "months", unable to define time frame.  Noted right lower molar tooth extraction and tooth pain causing weight loss per MD note.  Patient reports typically eats 2 eggs and cheese toast for breakfast with applesauce, sometimes eats grits.  Lunch is peanut butter and jelly sandwich.  Dinner is mashed potatoes and lima beans or green beans or cabbage and sometimes meat (chicken, pork).  Patient drinks 1-2 ensure per day original but sometimes plus.  Patient denies difficulty swallowing.      Nutrition Focused Physical Exam:  Limited exam performed as patient fully clothed.     Orbital - moderate Buccal - moderate Upper arm- moderate Ribs- moderate/severe  Temple - moderate Patient wearing 2 shirts and sweater Scapula - severe (felt through sweater Thigh - severe Calf - moderate   Medications: aricept, folic acid, predinsone, thiamine, vit D   Labs: reviewed   Anthropometrics:   Height: 67 inches Weight: 111 lb 4/27 UBW: 130 lb (per patient report but unsure time frame - "months" BMI: 17 (underweight) 15% weight loss in unknown time frame  Estimated Energy Needs  Kcals: 1500-1750 Protein: 75-88 g Fluid: > 1.5 L   NUTRITION DIAGNOSIS: Inadequate oral intake related to recent tooth pain, 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  Will send message to MD.   Provided patient with higher calories shakes to try (ensure enlive, boost plus, orgain, [employer] shake).   Discussed briefly ways to increase calories and protein.  Handout provided Also discussed soft moist protein foods and handout provided.   Recommend patient be followed by SLP/rehab due to receiving radiation.   Provided contact information   MONITORING, EVALUATION, GOAL: weight trends, intake, treatment   Next Visit: to be determined with treatment  Cordarius Benning B. Zenia Resides, [place], [place] Registered Dietitian [phone] (pager)

## 2019-06-24 NOTE — Telephone Encounter (Signed)
Patient's wife informed of bx appt details.

## 2019-06-24 NOTE — Telephone Encounter (Signed)
Done...  [facility] Pick up is scheduled for 7:30 Has pt been made aware

## 2019-06-24 NOTE — Progress Notes (Signed)
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      Interval history-Mr. Gregory Burton is a 79 year old male who presents to chemo care clinic today for initial meeting in preparation for starting chemotherapy. I introduced the chemo care clinic and we discussed that the role of the clinic is to assist those who are at an increased risk of emergency room visits and/or complications during the course of chemotherapy treatment. We discussed that the increased risk takes into account factors such as age, performance status, and co-morbidities. We also discussed that for some, this might include barriers to care such as not having a primary care provider, lack of insurance/transportation, or not being able to afford medications. We discussed that the goal of the program is to help prevent unplanned ER visits and help reduce complications during chemotherapy. We do this  by discussing specific risk factors to each individual and identifying ways that we can help improve these risk factors and reduce barriers to care.   ECOG FS:1 - Symptomatic but completely ambulatory  Review of systems- Review of Systems  Constitutional: Negative.  Negative for chills, fever, malaise/fatigue and weight loss.  HENT: Negative for congestion, ear pain and tinnitus.   Eyes: Negative.  Negative for blurred vision and double vision.  Respiratory: Negative.  Negative for cough, sputum production and shortness of breath.   Cardiovascular: Negative.  Negative for chest pain, palpitations and leg swelling.  Gastrointestinal: Negative.  Negative for abdominal pain, constipation, diarrhea, nausea and vomiting.  Genitourinary: Negative for dysuria, frequency and urgency.  Musculoskeletal: Negative for back pain and falls.  Skin: Negative.  Negative for rash.  Neurological: Negative.  Negative for weakness and headaches.  Endo/Heme/Allergies: Negative.  Does not bruise/bleed easily.  Psychiatric/Behavioral: Positive for memory loss. Negative for depression. The patient is not nervous/anxious and does not have insomnia.      Current treatment-concurrent chemoradiation-cisplatin q. 7 days  Allergies  Allergen Reactions  . Enalapril Rash    Past Medical History:  Diagnosis Date  . Alcohol abuse   . Blood transfusion without reported diagnosis 2013  . Cataract   . Glaucoma   . Gout   . Hypercholesteremia   . Prostate CA [facility]) 2008    Past Surgical History:  Procedure Laterality Date  . BRAIN SURGERY  2012  . HERNIA REPAIR      Social History   Socioeconomic History  . Marital status: Married    Spouse name:  Not on file  . Number of children: Not on file  . Years of education: Not on file  . Highest education level: Not on file  Occupational History  . Not on file  Tobacco Use  . Smoking status: Former Smoker    Years: 21.00    Types: Cigarettes    Quit date:  05/11/2008    Years since quitting: 11.1  . Smokeless tobacco: Never Used  Substance and Sexual Activity  . Alcohol use: Yes    Comment: 0.5 pint liquor a week  . Drug use: Never  . Sexual activity: Not Currently  Other Topics Concern  . Not on file  Social History Narrative  . Not on file   Social Determinants of Health   Financial Resource Strain:   . Difficulty of Paying Living Expenses:   Food Insecurity:   . Worried About Charity fundraiser in the Last Year:   . Arboriculturist in the Last Year:   Transportation Needs:   . Film/video editor (Medical):   Gregory Burton Kitchen Lack of Transportation (Non-Medical):   Physical Activity:   . Days of Exercise per Week:   . Minutes of Exercise per Session:   Stress:   . Feeling of Stress :   Social Connections:   . Frequency of Communication with Friends and Family:   . Frequency of Social Gatherings with Friends and Family:   . Attends Religious Services:   . Active Member of Clubs or Organizations:   . Attends Archivist Meetings:   Gregory Burton Kitchen Marital Status:   Intimate Partner Violence:   . Fear of Current or Ex-Partner:   . Emotionally Abused:   Gregory Burton Kitchen Physically Abused:   . Sexually Abused:     No family history on file.   Current Outpatient Medications:  .  atorvastatin (LIPITOR) 20 MG tablet, Take 20 mg by mouth daily., Disp: , Rfl:  .  donepezil (ARICEPT) 10 MG tablet, Take 10 mg by mouth at bedtime., Disp: , Rfl:  .  dorzolamide-timolol (COSOPT) 22.3-6.8 MG/ML ophthalmic solution, 1 drop 2 (two) times daily., Disp: , Rfl:  .  folic acid (FOLVITE) 1 MG tablet, Take 1 mg by mouth daily., Disp: , Rfl:  .  latanoprost (XALATAN) 0.005 % ophthalmic solution, 1 drop at bedtime., Disp: , Rfl:  .  predniSONE (DELTASONE) 5 MG tablet, Take 5 mg by mouth daily with breakfast. As needed for gout flare, Disp: , Rfl:  .  Thiamine HCl (VITAMIN B-1 PO), Take by mouth. 100 mg QD, Disp: , Rfl:  .  Vitamin D, Cholecalciferol, 50 MCG (2000 UT)  CAPS, Take 1 capsule by mouth., Disp: , Rfl:   Physical exam: There were no vitals filed for this visit. Physical Exam Constitutional:      Appearance: Normal appearance.  HENT:     Head: Normocephalic and atraumatic.  Eyes:     Pupils: Pupils are equal, round, and reactive to light.  Cardiovascular:     Rate and Rhythm: Normal rate and regular rhythm.     Heart sounds: Normal heart sounds. No murmur.  Pulmonary:     Effort: Pulmonary effort is normal.     Breath sounds: Normal breath sounds. No wheezing.  Abdominal:     General: Bowel sounds are normal. There is no distension.     Palpations: Abdomen is soft.     Tenderness: There is no abdominal tenderness.  Musculoskeletal:        General: Normal range  of motion.     Cervical back: Normal range of motion.  Skin:    General: Skin is warm and dry.     Findings: No rash.  Neurological:     Mental Status: He is alert and oriented to person, place, and time.  Psychiatric:        Judgment: Judgment normal.      CMP Latest Ref Rng & Units 05/12/2019  Glucose 70 - 99 mg/dL 79  BUN 8 - 23 mg/dL 14  Creatinine 0.61 - 1.24 mg/dL 0.63  Sodium 135 - 145 mmol/L 139  Potassium 3.5 - 5.1 mmol/L 4.0  Chloride 98 - 111 mmol/L 103  CO2 22 - 32 mmol/L 28  Calcium 8.9 - 10.3 mg/dL 9.4  Total Protein 6.5 - 8.1 g/dL 7.7  Total Bilirubin 0.3 - 1.2 mg/dL 1.2  Alkaline Phos 38 - 126 U/L 57  AST 15 - 41 U/L 20  ALT 0 - 44 U/L 16   CBC Latest Ref Rng & Units 05/12/2019  WBC 4.0 - 10.5 K/uL 5.8  Hemoglobin 13.0 - 17.0 g/dL 12.7(L)  Hematocrit 39.0 - 52.0 % 39.5  Platelets 150 - 400 K/uL 222    No images are attached to the encounter.  CT SOFT TISSUE NECK W CONTRAST  Result Date: 05/27/2019 CLINICAL DATA:  Right neck mass EXAM: CT NECK WITH CONTRAST TECHNIQUE: Multidetector CT imaging of the neck was performed using the standard protocol following the bolus administration of intravenous contrast. CONTRAST:  4mL OMNIPAQUE IOHEXOL 300  MG/ML  SOLN COMPARISON:  None. FINDINGS: Pharynx and larynx: Enhancing mass in the right piriform sinus measuring 17 mm in diameter. Appearance is concerning for neoplasm. Possible extension into the base of the epiglottis on the right. Base of tongue normal. No airway compromise. Larynx normal. Salivary glands: No inflammation, mass, or stone. Thyroid: Negative Lymph nodes: Cystic mass in the right lateral neck at the level of the piriform sinus. Cyst measures 15 x 24 mm and is deep to the sternocleidomastoid muscle. There is mild enhancement of the wall the cyst which is likely a necrotic lymph node. Cystic mass in the retropharyngeal soft tissues on the right in the region of the nasopharynx. This measures approximately 18 mm in diameter and likely is a necrotic lymph node. Cluster of lymph nodes in the right posterior neck measuring up to 12 mm in diameter. These are also likely malignant lymph nodes. Vascular: Normal vascular enhancement. Atherosclerotic calcification is present in the aortic arch and carotid bifurcation. 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 Posterior lymph node on the right appears to extend into the right jugular vein without occlusion. Limited intracranial: Negative Visualized orbits: Negative Mastoids and visualized paranasal sinuses: Mild mucosal edema in the ethmoid sinuses. Remaining sinuses clear. Mastoid clear bilaterally. Skeleton: Cervical spondylosis. Thoracic scoliosis. No acute skeletal abnormality. Right lower molar extraction site without complication. Upper chest: 10 mm spiculated nodule left upper lobe with central necrosis. Right apex clear. Other: None IMPRESSION: 1. Solid enhancing mass in the right piriform sinus measuring 17 mm, highly suspicious for carcinoma. 2. Multiple lymph nodes in the right neck including cystic  nodes in the right posterior nasopharynx and right lateral neck. Posterior lymph nodes in the right neck also likely malignant, 1 of which extends into the right jugular vein. Tissue sampling and direct laryngoscopy recommended to evaluate for  neoplasm. 3. 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 PET-CT would be helpful for further evaluation and staging. 4. Atherosclerotic disease. 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 This appears to be a flow limiting lesion with decreased caliber of the right internal carotid artery which is patent. Electronically Signed   By: Franchot Gallo M.D.   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 A total of Versed 1.0 mg and Fentanyl 50 mcg was administered intravenously. Moderate Sedation Time: 12 minutes. The patient's level of consciousness and vital signs were monitored continuously by radiology nursing throughout the procedure under my direct supervision. FLUOROSCOPY TIME:  Fluoroscopy Time: None. COMPLICATIONS: None immediate. PROCEDURE: Informed written consent was obtained from the patient after a thorough discussion of the procedural risks, benefits and alternatives. All questions were addressed. Maximal Sterile Barrier Technique was utilized including caps, mask, sterile gowns, sterile gloves, sterile drape, hand hygiene and skin antiseptic. A timeout was performed prior to the initiation of the procedure. Previous imaging reviewed. Preliminary ultrasound performed. The right cervical abnormal  adenopathy was localized and marked. Ultrasound aspiration: Under sterile conditions and local anesthesia, an 18 gauge needle was advanced into the cystic necrotic portion of the adenopathy. Syringe aspiration yielded 2 cc exudative fluid. Sample sent for cytology. Needle removed. Ultrasound core biopsy: Under sterile conditions and local anesthesia, an 18 gauge core biopsy needle was advanced to the residual component of the right cervical adenopathy. 18 gauge core biopsies obtained. These were placed in formalin. Needle removed. Postprocedure imaging demonstrates no hemorrhage or hematoma. Patient tolerated biopsy well. IMPRESSION: Successful ultrasound aspiration for cytology and core biopsy for pathology of the right neck necrotic cervical adenopathy. Electronically Signed   By: Jerilynn Mages.  Shick M.D.   On: 06/11/2019 13:10   NM PET Image Initial (PI) Skull Base To Thigh  Result Date: 06/07/2019 CLINICAL DATA:  Initial treatment strategy for neck mass. EXAM: NUCLEAR MEDICINE PET SKULL BASE TO THIGH TECHNIQUE: 6.57 mCi F-18 FDG was injected intravenously. Full-ring PET imaging was performed from the skull base to thigh after the radiotracer. CT data was obtained and used for attenuation correction and anatomic localization. Fasting blood glucose: The 56 mg/dl COMPARISON:  CT 05/27/2019 FINDINGS: Mediastinal blood pool activity: SUV max 2.39 Liver activity: SUV max NA NECK: Base of tongue mass extending into the right peer form sinus is identified. This has an SUV max of 12.69. Multiple right-sided FDG avid lymph nodes are identified. Index right level 2 node within the measures 2.5 cm within SUV max of 5.7. Right level 2 node posterior and medial to the parotid gland measures 2.2 cm within SUV max of 9.6. Right FDG avid node at the level of the piriform sinus measures 2.2 cm within SUV max of 5.8. Incidental CT findings: none CHEST: No FDG avid axillary, supraclavicular, mediastinal, or hilar lymph nodes. Within the  posterolateral left upper lobe there is a nodule with central cavitary component measuring 1 cm. This has an SUV max of 1.8, image 89/3. Incidental CT findings: A few scattered calcified pleural plaques are noted suggesting previous asbestos exposure. Aortic atherosclerosis. Three vessel coronary artery atherosclerotic calcifications. ABDOMEN/PELVIS:  No abnormal hypermetabolic activity within the liver, pancreas, adrenal glands, or spleen. No hypermetabolic lymph nodes in the abdomen or pelvis. Incidental CT findings: Aortic atherosclerosis. Seed implants noted within the prostate gland. SKELETON: No focal hypermetabolic activity to suggest skeletal metastasis. Incidental CT findings: none IMPRESSION: 1. 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 2. Multiple FDG avid cervical lymph nodes compatible with ipsilateral nodal metastasis. No FDG avid left cervical lymph nodes identified. 3. Left upper lobe pulmonary nodule exhibits mild FDG uptake within SUV max of 1.8. Cannot rule out pulmonary metastasis. 4. 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 No findings of pulmonary asbestosis. 5. Coronary artery calcifications 6.  Aortic Atherosclerosis (ICD10-I70.0). Electronically Signed   By: Kerby Moors M.D.   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 A total of Versed 1.0 mg and Fentanyl 50 mcg was administered intravenously. Moderate Sedation Time: 12 minutes. The patient's level of  consciousness and vital signs were monitored continuously by radiology nursing throughout the procedure under my direct supervision. FLUOROSCOPY TIME:  Fluoroscopy Time: None. COMPLICATIONS: None immediate. PROCEDURE: Informed written consent was obtained from the patient after a thorough discussion of the procedural risks, benefits and alternatives. All questions were addressed. Maximal Sterile Barrier Technique was utilized including caps, mask, sterile gowns, sterile gloves, sterile drape, hand hygiene and skin antiseptic. A timeout was performed prior to the initiation of the procedure. Previous imaging reviewed. Preliminary ultrasound performed. The right cervical abnormal adenopathy was localized and marked. Ultrasound aspiration: Under sterile conditions and local anesthesia, an 18 gauge needle was advanced into the cystic necrotic portion of the adenopathy. Syringe aspiration yielded 2 cc exudative fluid. Sample sent for cytology. Needle removed. Ultrasound core biopsy: Under sterile conditions and local anesthesia, an 18 gauge core biopsy needle was advanced to the residual component of the right cervical adenopathy. 18 gauge core biopsies obtained. These were placed in formalin. Needle removed. Postprocedure imaging demonstrates no hemorrhage or hematoma. Patient tolerated biopsy well. IMPRESSION: Successful ultrasound aspiration for cytology and core biopsy for pathology of the right neck necrotic cervical adenopathy. Electronically Signed   By: Jerilynn Mages.  Shick M.D.   On: 06/11/2019 13:10     Assessment and plan- Patient is a 79 y.o. male who presents to [facility] for initial meeting in preparation for starting chemotherapy for the treatment of head and neck cancer.    1. HPI: Mr. Worley is a 79 year old male with past medical history significant for alcoholism and prostate cancer who was recently diagnosed with head and neck cancer.  He reported a right lymph node on his neck that was sore, made  him cough and "felt funny" when is swallowed.  Previously evaluated with neck sonogram by PCP.  Around the same time, patient had a right lower molar tooth extracted and took a course of antibiotics.  He found his appetite was decreased and he lost about 20 pounds which was likely due to the tooth extraction.  He most recently had biopsy which showed squamous cell carcinoma p16 negative.  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  Plan is for concurrent chemoradiation.  He will receive cisplatin weekly beginning when XRT begins.  Imaging also showed a new left upper lobe pulmonary nodule with mild FDG uptake.  Could not  rule out pulmonary metastasis versus primary lung cancer so a CT-guided lung biopsy was recommended.He is scheduled to have this completed on 06/29/19.   2. Chemo Care Clinic/High Risk for ER/Hospitalization during chemotherapy- We discussed the role of the chemo care clinic and identified patient specific risk factors. I discussed that patient was identified as high risk primarily based on: stage of disease.   Patient has past medical history positive for: Past Medical History:  Diagnosis Date  . Alcohol abuse   . Blood transfusion without reported diagnosis 2013  . Cataract   . Glaucoma   . Gout   . Hypercholesteremia   . Prostate CA [facility]) 2008    Patient has past surgical history positive for: Past Surgical History:  Procedure Laterality Date  . BRAIN SURGERY  2012  . HERNIA REPAIR      Based on our high risk symptom management report; this patient has a high risk of ED utilization.  The percentage below indicates how "at risk "  this patient based on the factors in this table within one year.   General Risk Score: 1  Values used to calculate this score:   Points  Metrics      1        Age: 28      0        Hospital Admissions: 0      0        ED Visits: 0      0         Has Chronic Obstructive Pulmonary Disease: No      0        Has Diabetes: No      0        Has Congestive Heart Failure: No      0        Has liver disease: No      0        Has Depression: No      0        Current PCP: Gregory Carls, MD      0        Has Medicaid: No   3. We discussed that social determinants of health may have significant impacts on health and outcomes for cancer patients.  Today we discussed specific social determinants of performance status, alcohol use, depression, financial needs, food insecurity, housing, interpersonal violence, social connections, stress, tobacco use, and transportation.    After lengthy discussion the following were identified as areas of need: Patient will need transportation to and from the cancer center with every appointment.  Patient and his wife recently moved from [place] and currently do not have a car.  They have been relying on the patient's wife's brother for transportation needs.  Mr. Kinslow and his wife both had their first Covid 6 vaccine yesterday 06/23/2019.  They are scheduled to have their second dose on 07/23/2019.  I will check with [name] to see if this interferes with initiating chemo.  Outpatient services: We discussed options including home based and outpatient services, DME and care program. We discusssed that patients who participate in regular physical activity report fewer negative impacts of cancer and treatments and report  less fatigue.   Financial Concerns: We discussed that living with cancer can create tremendous financial burden.  We discussed options for assistance. I asked that if assistance is needed in affording medications or paying bills to please let us know so that we can provide assistance. We discussed options for food including social services, [employer] ($50 every 2 weeks) and onsite food pantry.  We will also notify [facility] to see if cancer center can provide additional  support.  Referral to Social work: Introduced Education officer, museum Elease Etienne and the services he can provide such as support with [employer], cell phone and gas vouchers.   Support groups: We discussed options for support groups at the cancer center. If interested, please notify nurse navigator to enroll. We discussed options for managing stress including healthy eating, exercise as well as participating in no charge counseling services at the cancer center and support groups.  If these are of interest, patient can notify either myself or primary nursing team.We discussed options for management including medications and referral to quit Smart program  Transportation: We discussed options for transportation including acta, paratransit, bus routes, link transit, taxi/uber/lyft, and cancer center [place].  I have notified primary oncology team who will help assist with arranging [employer] transportation for appointments when/if needed. We also discussed options for transportation on short notice/acute visits.  Palliative care services: We have palliative care services available in the cancer center to discuss goals of care and advanced care planning.  Please let us know if you have any questions or would like to speak to our palliative nurse practitioner.  Symptom Management Clinic: We discussed our symptom management clinic which is available for acute concerns while receiving treatment such as nausea, vomiting or diarrhea.  We can be reached via telephone at [phone] or through my chart.  We are available for virtual or in person visits on the same day from 830 to 4 PM Monday through Friday. He denies needing specific assistance at this time and He will be followed by [name] 's clinical team.  Plan: Discussed symptom management clinic. Discussed palliative care services. Discussed resources that are available here at the cancer center.  Patient is interested in transportation services using our [employer] to and from his  appointments. Discussed medications and new prescriptions to begin treatment such as anti-nausea or steroids.  Discussed with [name] about 2nd COVID-19 vaccine-scheduled for 07/23/2019  Disposition: RTC on on 06/29/2019 for CT-guided biopsy. RTC on 06/30/2019 for consultation with Dr. Donella Stade. Anticipate he will be given chemoradiation about a week later.  Visit Diagnosis 1. 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[facility])     Patient expressed understanding and was in agreement with this plan. He also understands that He can call clinic at any time with any questions, concerns, or complaints.   Greater than 50% was spent in counseling and coordination of care with this patient including but not limited to discussion of the relevant topics above (See A&P) including, but not limited to diagnosis and management of acute and chronic medical conditions.   [facility] [place]  CC:

## 2019-06-24 NOTE — Telephone Encounter (Signed)
Biopsy is scheduled for 06/29/19 arrive at 8:00 for 9:00.  Will get him scheduled for Lucianne Lei pick up but he will need to arrange his own transportation home due to sedation for the bx.

## 2019-06-25 ENCOUNTER — Other Ambulatory Visit
Admission: RE | Admit: 2019-06-25 | Discharge: 2019-06-25 | Disposition: A | Discharge status: Home / Self Care | Payer: Medicare Other | Source: Ambulatory Visit | Attending: Oncology | Admitting: Oncology

## 2019-06-25 ENCOUNTER — Other Ambulatory Visit: Payer: Self-pay | Admitting: Radiology

## 2019-06-25 DIAGNOSIS — Z01812 Encounter for preprocedural laboratory examination: Secondary | ICD-10-CM | POA: Insufficient documentation

## 2019-06-25 DIAGNOSIS — Z20822 Contact with and (suspected) exposure to covid-19: Secondary | ICD-10-CM | POA: Diagnosis not present

## 2019-06-25 LAB — SARS CORONAVIRUS 2 (TAT 6-24 HRS): SARS Coronavirus 2: NEGATIVE

## 2019-06-28 ENCOUNTER — Other Ambulatory Visit: Payer: Self-pay | Admitting: Radiology

## 2019-06-29 ENCOUNTER — Other Ambulatory Visit: Payer: Self-pay

## 2019-06-29 ENCOUNTER — Ambulatory Visit
Admission: RE | Admit: 2019-06-29 | Discharge: 2019-06-29 | Disposition: A | Discharge status: Home / Self Care | Payer: Medicare Other | Source: Ambulatory Visit | Attending: Oncology | Admitting: Oncology

## 2019-06-29 ENCOUNTER — Ambulatory Visit
Admission: RE | Admit: 2019-06-29 | Discharge: 2019-06-29 | Disposition: A | Discharge status: Home / Self Care | Payer: Medicare Other | Source: Ambulatory Visit | Attending: Interventional Radiology | Admitting: Interventional Radiology

## 2019-06-29 DIAGNOSIS — Z888 Allergy status to other drugs, medicaments and biological substances status: Secondary | ICD-10-CM | POA: Insufficient documentation

## 2019-06-29 DIAGNOSIS — Z8546 Personal history of malignant neoplasm of prostate: Secondary | ICD-10-CM | POA: Diagnosis not present

## 2019-06-29 DIAGNOSIS — H409 Unspecified glaucoma: Secondary | ICD-10-CM | POA: Diagnosis not present

## 2019-06-29 DIAGNOSIS — E785 Hyperlipidemia, unspecified: Secondary | ICD-10-CM | POA: Diagnosis not present

## 2019-06-29 DIAGNOSIS — C3412 Malignant neoplasm of upper lobe, left bronchus or lung: Secondary | ICD-10-CM | POA: Diagnosis not present

## 2019-06-29 DIAGNOSIS — R911 Solitary pulmonary nodule: Secondary | ICD-10-CM | POA: Diagnosis present

## 2019-06-29 DIAGNOSIS — Z79899 Other long term (current) drug therapy: Secondary | ICD-10-CM | POA: Diagnosis not present

## 2019-06-29 DIAGNOSIS — Z87891 Personal history of nicotine dependence: Secondary | ICD-10-CM | POA: Diagnosis not present

## 2019-06-29 DIAGNOSIS — M109 Gout, unspecified: Secondary | ICD-10-CM | POA: Diagnosis not present

## 2019-06-29 DIAGNOSIS — Y848 Other medical procedures as the cause of abnormal reaction of the patient, or of later complication, without mention of misadventure at the time of the procedure: Secondary | ICD-10-CM | POA: Diagnosis not present

## 2019-06-29 DIAGNOSIS — Z923 Personal history of irradiation: Secondary | ICD-10-CM | POA: Insufficient documentation

## 2019-06-29 DIAGNOSIS — J95811 Postprocedural pneumothorax: Secondary | ICD-10-CM | POA: Diagnosis not present

## 2019-06-29 LAB — CBC
HCT: 36.9 % — ABNORMAL LOW (ref 39.0–52.0)
Hemoglobin: 11.9 g/dL — ABNORMAL LOW (ref 13.0–17.0)
MCH: 30 pg (ref 26.0–34.0)
MCHC: 32.2 g/dL (ref 30.0–36.0)
MCV: 92.9 fL (ref 80.0–100.0)
Platelets: 220 10*3/uL (ref 150–400)
RBC: 3.97 MIL/uL — ABNORMAL LOW (ref 4.22–5.81)
RDW: 11.9 % (ref 11.5–15.5)
WBC: 5.5 10*3/uL (ref 4.0–10.5)
nRBC: 0 % (ref 0.0–0.2)

## 2019-06-29 LAB — PROTIME-INR
INR: 1 (ref 0.8–1.2)
Prothrombin Time: 12.9 seconds (ref 11.4–15.2)

## 2019-06-29 IMAGING — CT CT BIOPSY
2 series · 10 of 14 positions shown, 12 images · non-contrast
Comparison: none

CLINICAL DATA: Metastatic laryngeal squamous carcinoma to cervical
lymph nodes. Staging imaging as also demonstrated a roughly 1 cm
cavitary nodule in the left upper lobe. The patient presents for
biopsy to determine whether this represents metastatic disease,
primary lung carcinoma or benign nodule.

[Series 2: i-spiral 5.0 b30f · axial · 0.73mm/px · z∈[+746,+871]mm · 6 of 35 slices shown, 8 images]
[im 5/35  soft-tissue]
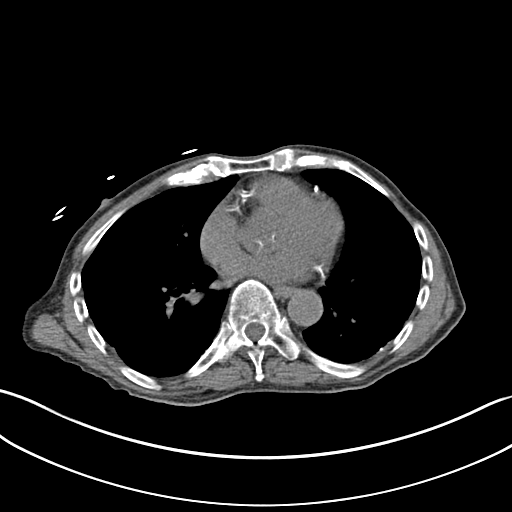
[im 5/35  bone]
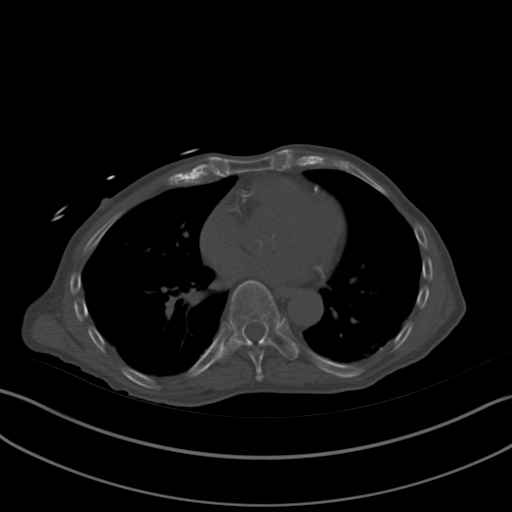
[im 10/35  bone]
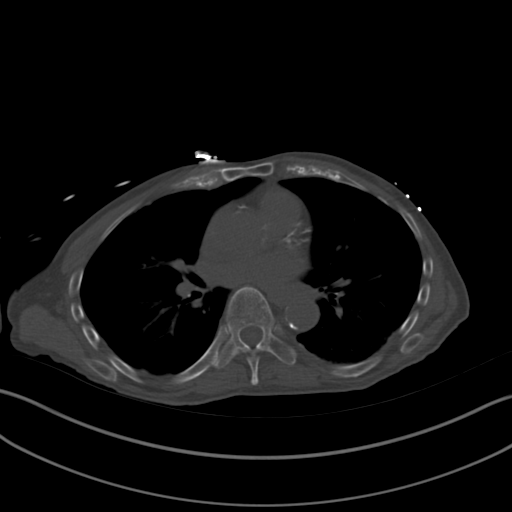
[im 15/35  bone]
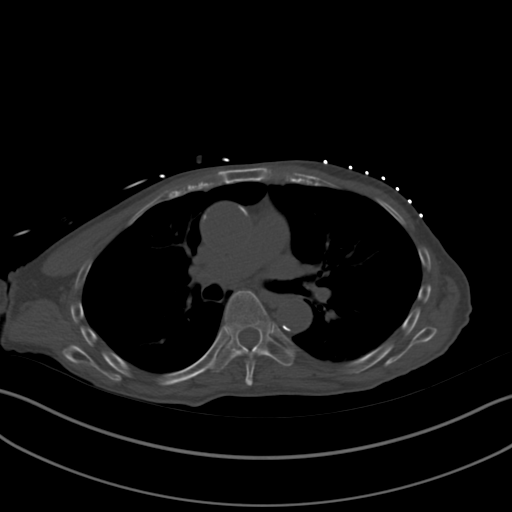
[im 20/35  bone]
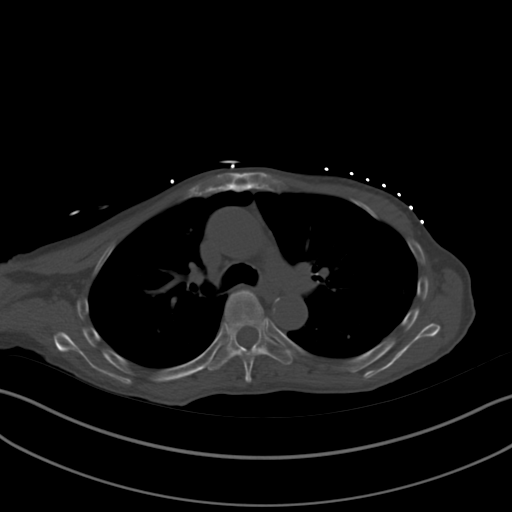
[im 25/35  soft-tissue]
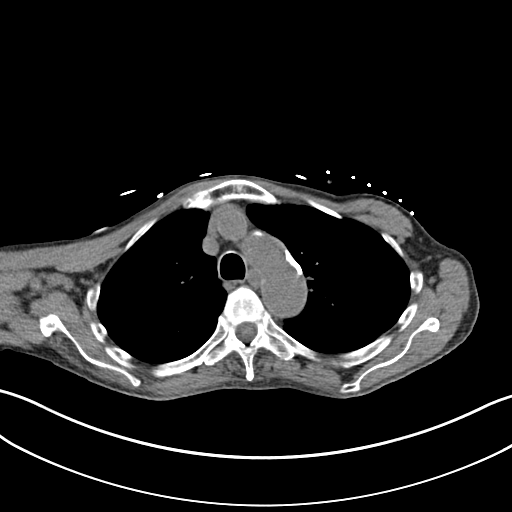
[im 25/35  bone]
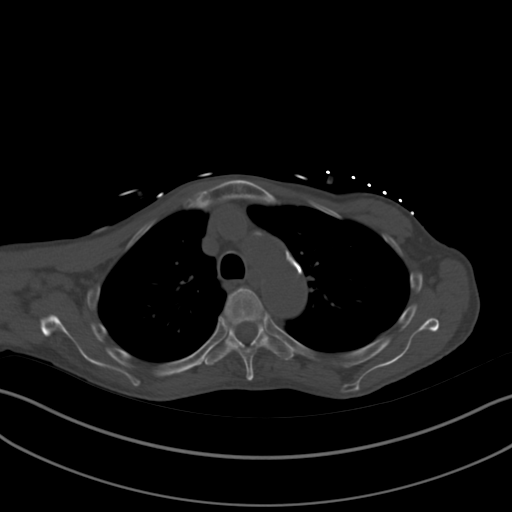
[im 30/35  bone]
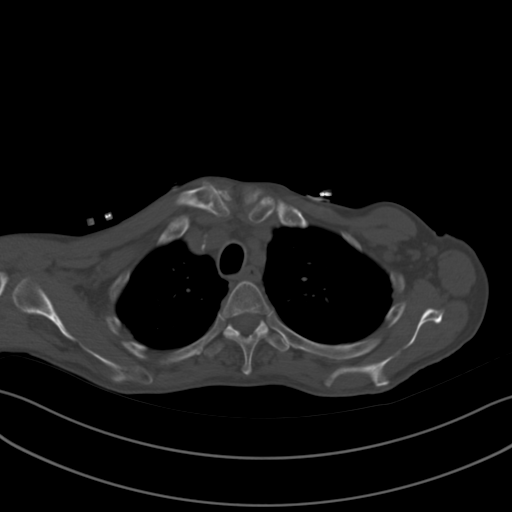

[Series 3: i-sequence 4.8 b30s · axial · 0.73mm/px · z∈[+822,+831]mm · 4 of 30 slices shown]
[im 6/30  bone]
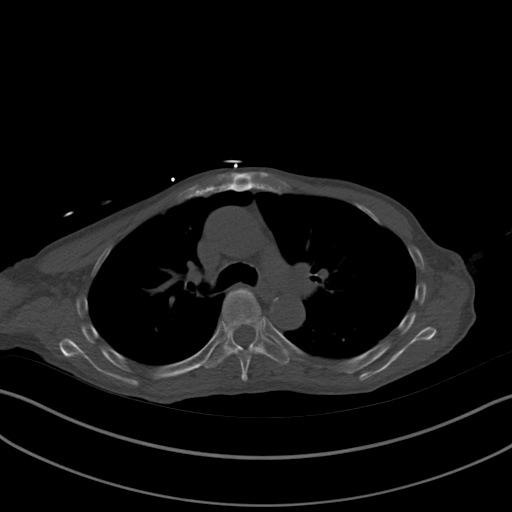
[im 12/30  bone]
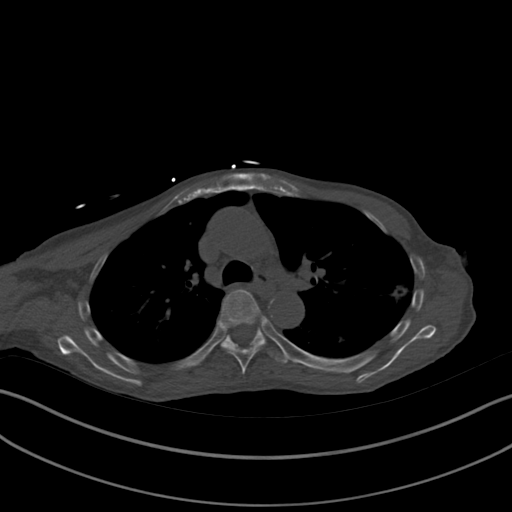
[im 18/30  bone]
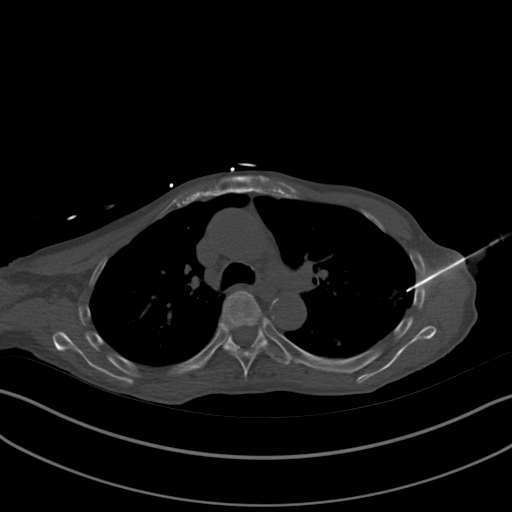
[im 24/30  bone]
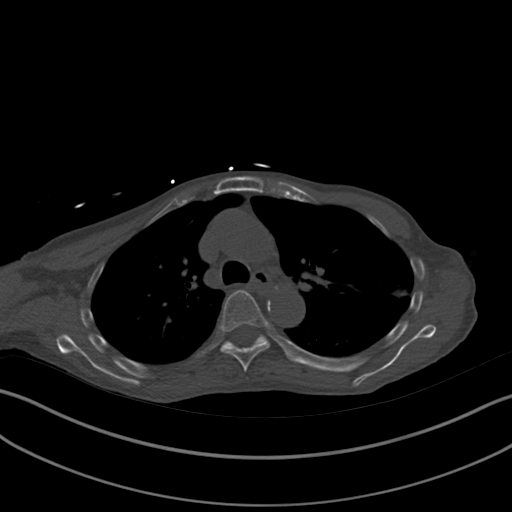

[10 of 14 positions shown; findings below may reference images not displayed]

EXAM:
CT GUIDED CORE BIOPSY OF LEFT UPPER LOBE LUNG NODULE

ANESTHESIA/SEDATION:
2.0 mg IV Versed; 100 mcg IV Fentanyl

Total Moderate Sedation Time:  22 minutes.

The patient's level of consciousness and physiologic status were
continuously monitored during the procedure by Radiology nursing.

PROCEDURE:
The procedure risks, benefits, and alternatives were explained to
the patient and his wife. Questions regarding the procedure were
encouraged and answered. The patient and his wife understand and
consent to the procedure. A time-out was performed prior to
initiating the procedure.

Supine imaging was performed through the upper to mid chest. The
left upper chest wall was prepped with chlorhexidine in a sterile
fashion, and a sterile drape was applied covering the operative
field. A sterile gown and sterile gloves were used for the
procedure. Local anesthesia was provided with 1% Lidocaine.

Under CT guidance, a 17 gauge trocar needle was advanced to the
level of a left upper lobe pulmonary nodule. After confirming needle
tip position, 2 separate coaxial 18 gauge core biopsy samples were
obtained. Additional imaging was performed. Aspiration was then
performed via the outer needle at the level of the pleural space.
Additional CT was performed after needle removal.

COMPLICATIONS:
Tiny left lateral pneumothorax. SIR level B: Nominal therapy
(including overnight admission for observation), no consequence.
FINDINGS: Small lateral and posterior left upper lobe nodule again
demonstrated which measures approximately 9 x 12 mm. This nodule
demonstrates central cavitation. One small tissue fragment and a
second more intact core biopsy sample were able to be obtained.
During the procedure, a small left lateral pneumothorax was
sustained. This was treated with aspiration via the outer needle
upon retraction resulting in complete aspiration and decompression
of the pneumothorax.
IMPRESSION: CT-guided core biopsy of cavitary left upper lobe lung nodule
measuring approximately 9 x 12 mm by CT today. The procedure was
complicated by a tiny lateral pneumothorax treated by needle
aspiration which resulted in complete evacuation. A follow-up chest
x-ray will be performed during recovery.

## 2019-06-29 IMAGING — DX DG CHEST 1V PORT
1 series · 1 of 1 positions shown · non-contrast
Comparison: Imaging during CT-guided lung biopsy earlier today

CLINICAL DATA: Status post percutaneous biopsy of left upper lobe
lung nodule with aspiration small left pneumothorax at the time of
the procedure.

EXAM:
PORTABLE CHEST 1 VIEW

[chest ap]
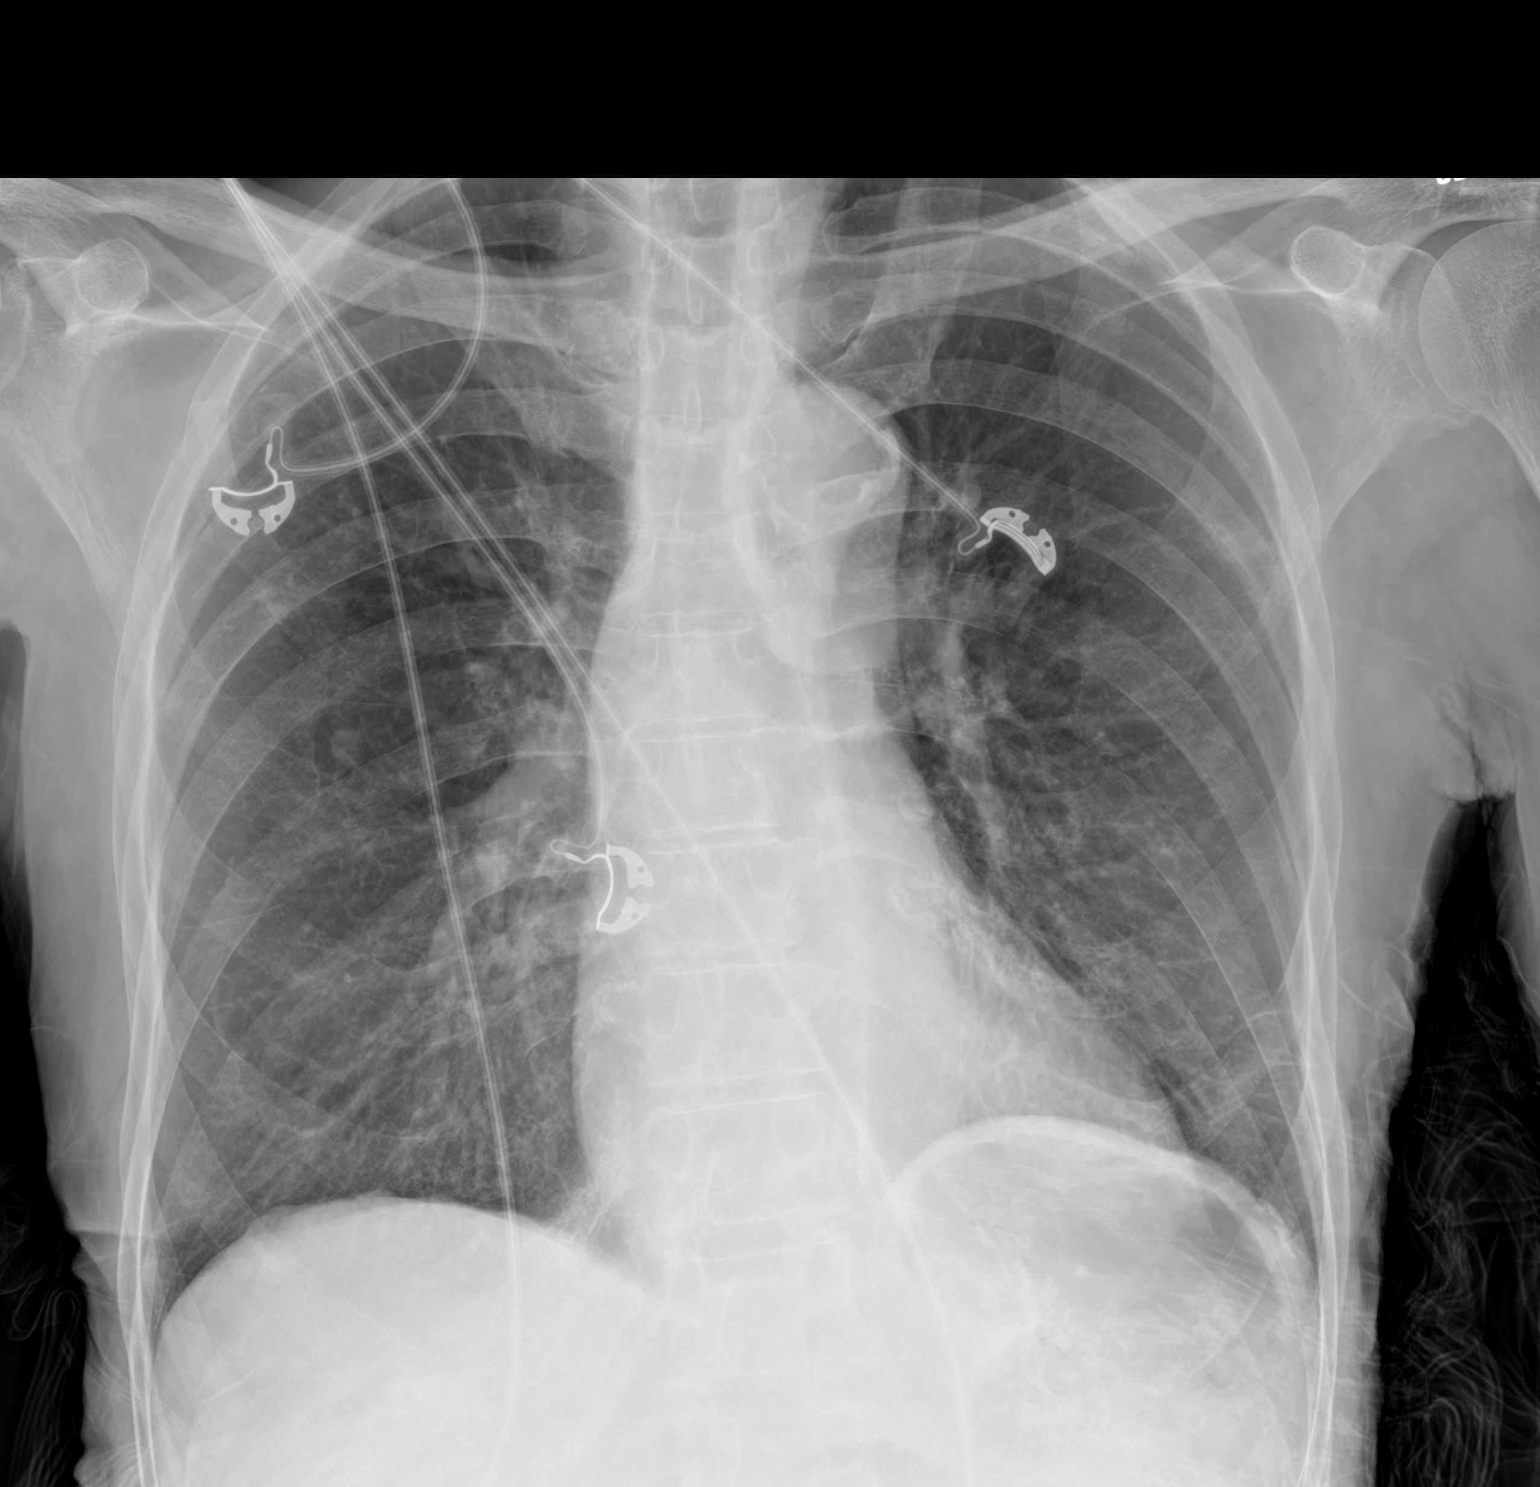

[1 of 1 positions shown; findings below may reference images not displayed]

FINDINGS: The heart size and mediastinal contours are within normal limits. No
pneumothorax identified. Small peripheral left upper lung opacity
corresponds to the nodule sampled earlier today. No evidence of
pleural fluid, consolidation or edema. The visualized skeletal
structures are unremarkable.
IMPRESSION: No pneumothorax after left upper lobe lung biopsy.

## 2019-06-29 MED ORDER — FENTANYL CITRATE (PF) 100 MCG/2ML IJ SOLN
INTRAMUSCULAR | Status: AC | PRN
  Administered 2019-06-29 (×2): 50 ug via INTRAVENOUS

## 2019-06-29 MED ORDER — SODIUM CHLORIDE 0.9 % IV SOLN: INTRAVENOUS | Status: DC

## 2019-06-29 MED ORDER — FENTANYL CITRATE (PF) 100 MCG/2ML IJ SOLN
INTRAMUSCULAR | Status: AC
  Filled 2019-06-29: qty 2

## 2019-06-29 MED ORDER — MIDAZOLAM HCL 2 MG/2ML IJ SOLN
INTRAMUSCULAR | Status: AC
  Filled 2019-06-29: qty 2

## 2019-06-29 MED ORDER — MIDAZOLAM HCL 2 MG/2ML IJ SOLN
INTRAMUSCULAR | Status: AC | PRN
  Administered 2019-06-29 (×2): 1 mg via INTRAVENOUS

## 2019-06-29 NOTE — H&P (Signed)
Chief Complaint: Patient was seen in consultation today for a left upper lung nodule biopsy.  Referring Physician(s): Yu,Zhou  Supervising Physician: Aletta Edouard  Patient Status: [facility] - Out-pt  History of Present Illness: Gregory Burton is a 79 y.o. 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 Gregory Burton noted swelling on the right side of his neck approximately a month ago which was sore, caused him to cough and had a strange sensation when swallowing - he underwent a biopsy of this mass in IR on 4/16 and pathology from this biopsy confirmed squamous cell carcinoma. His case was discussed at tumor board and decision was made to proceed with a biopsy of a new left upper lobe pulmonary nodule with central cavity that exhibited mild FDG uptake on recent PET to determine if this is metastasis vs primary lung cancer.  Gregory Burton presents with his wife today - he reports that he has cut back on alcohol (having 1-2 "nips" sometimes daily and other times every few days) and has quit all other drugs/smoking which he is proud of. He has had a sore throat for several months which he attributed to irritation from the neck mass. He has trouble eating anything but soft foods as he had multiple teeth removed and his gums are still sore - he has been eating mostly mashed potatoes, soups, ensure and water at home. He and his wife state understanding of the procedure and are agreeable to proceed as planned.  Past Medical History:  Diagnosis Date  . Alcohol abuse   . Blood transfusion without reported diagnosis 2013  . Cataract   . Glaucoma   . Gout   . Hypercholesteremia   . Prostate CA [facility]) 2008    Past Surgical History:  Procedure Laterality Date  . BRAIN SURGERY  2012  . HERNIA  REPAIR      Allergies: Enalapril  Medications: Prior to Admission medications   Medication Sig Start Date End Date Taking? Authorizing Provider  atorvastatin (LIPITOR) 20 MG tablet Take 20 mg by mouth daily.    [provider]  donepezil (ARICEPT) 10 MG tablet Take 10 mg by mouth at bedtime.    [provider]  dorzolamide-timolol (COSOPT) 22.3-6.8 MG/ML ophthalmic solution 1 drop 2 (two) times daily.    [provider]  folic acid (FOLVITE) 1 MG tablet Take 1 mg by mouth daily.    [provider]  latanoprost (XALATAN) 0.005 % ophthalmic solution 1 drop at bedtime.    [provider]  predniSONE (DELTASONE) 5 MG tablet Take 5 mg by mouth daily with breakfast. As needed for gout flare    [provider]  Thiamine HCl (VITAMIN B-1 PO) Take by mouth. 100 mg QD    [provider]  Vitamin D, Cholecalciferol, 50 MCG (2000 UT) CAPS Take 1 capsule by mouth.    [provider]     No family history on file.  Social History   Socioeconomic History  . Marital status: Married    Spouse name: Not on file  . Number of children: Not on file  . Years of education: Not on file  . Highest education level: Not on file  Occupational History  . Not on file  Tobacco Use  . Smoking status: Former Smoker    Years: 21.00  Types: Cigarettes    Quit date: 05/11/2008    Years since quitting: 11.1  . Smokeless tobacco: Never Used  Substance and Sexual Activity  . Alcohol use: Yes    Comment: 0.5 pint liquor a week  . Drug use: Never  . Sexual activity: Not Currently  Other Topics Concern  . Not on file  Social History Narrative  . Not on file   Social Determinants of Health   Financial Resource Strain:   . Difficulty of Paying Living Expenses:   Food Insecurity:   . Worried About Charity fundraiser in the Last Year:   . Arboriculturist in the Last Year:   Transportation Needs:   . Film/video editor (Medical):    Marland Kitchen Lack of Transportation (Non-Medical):   Physical Activity:   . Days of Exercise per Week:   . Minutes of Exercise per Session:   Stress:   . Feeling of Stress :   Social Connections:   . Frequency of Communication with Friends and Family:   . Frequency of Social Gatherings with Friends and Family:   . Attends Religious Services:   . Active Member of Clubs or Organizations:   . Attends Archivist Meetings:   Marland Kitchen Marital Status:      Review of Systems: A 12 point ROS discussed and pertinent positives are indicated in the HPI above.  All other systems are negative.  Review of Systems  Constitutional: Negative for chills and fever.  HENT: Positive for sore throat and trouble swallowing.   Respiratory: Negative for cough and shortness of breath.   Cardiovascular: Negative for chest pain.  Gastrointestinal: Negative for abdominal pain, diarrhea, nausea and vomiting.  Musculoskeletal: Negative for back pain.  Neurological: Negative for dizziness and headaches.    Vital Signs: There were no vitals taken for this visit.  Physical Exam Vitals reviewed.  Constitutional:      General: He is not in acute distress. HENT:     Head: Normocephalic.     Mouth/Throat:     Mouth: Mucous membranes are moist.     Pharynx: Oropharynx is clear. No oropharyngeal exudate or posterior oropharyngeal erythema.     Comments: Multiple missing teeth (top and bottom) Cardiovascular:     Rate and Rhythm: Normal rate and regular rhythm.  Pulmonary:     Effort: Pulmonary effort is normal.     Breath sounds: Normal breath sounds.  Abdominal:     General: There is no distension.     Palpations: Abdomen is soft.     Tenderness: There is no abdominal tenderness.  Skin:    General: Skin is warm and dry.  Neurological:     Mental Status: He is alert and oriented to person, place, and time.  Psychiatric:        Mood and Affect: Mood normal.        Behavior: Behavior normal.        Thought  Content: Thought content normal.        Judgment: Judgment normal.      MD Evaluation Airway: WNL Heart: WNL Abdomen: WNL Chest/ Lungs: WNL ASA  Classification: 2 Mallampati/Airway Score: One   Imaging: US Guided Needle Placement  Result Date: 06/11/2019 INDICATION: PET positive right cervical necrotic adenopathy, concern for head neck squamous cell carcinoma EXAM: Ultrasound aspiration of the right cervical necrotic adenopathy for cytology Ultrasound core biopsy of the right cervical necrotic adenopathy for pathology MEDICATIONS: 1% lidocaine local ANESTHESIA/SEDATION: Moderate (conscious)  sedation was employed during this procedure. A total of Versed 1.0 mg and Fentanyl 50 mcg was administered intravenously. Moderate Sedation Time: 12 minutes. The patient's level of consciousness and vital signs were monitored continuously by radiology nursing throughout the procedure under my direct supervision. FLUOROSCOPY TIME:  Fluoroscopy Time: None. COMPLICATIONS: None immediate. PROCEDURE: Informed written consent was obtained from the patient after a thorough discussion of the procedural risks, benefits and alternatives. All questions were addressed. Maximal Sterile Barrier Technique was utilized including caps, mask, sterile gowns, sterile gloves, sterile drape, hand hygiene and skin antiseptic. A timeout was performed prior to the initiation of the procedure. Previous imaging reviewed. Preliminary ultrasound performed. The right cervical abnormal adenopathy was localized and marked. Ultrasound aspiration: Under sterile conditions and local anesthesia, an 18 gauge needle was advanced into the cystic necrotic portion of the adenopathy. Syringe aspiration yielded 2 cc exudative fluid. Sample sent for cytology. Needle removed. Ultrasound core biopsy: Under sterile conditions and local anesthesia, an 18 gauge core biopsy needle was advanced to the residual component of the right cervical adenopathy. 18 gauge  core biopsies obtained. These were placed in formalin. Needle removed. Postprocedure imaging demonstrates no hemorrhage or hematoma. Patient tolerated biopsy well. IMPRESSION: Successful ultrasound aspiration for cytology and core biopsy for pathology of the right neck necrotic cervical adenopathy. Electronically Signed   By: Jerilynn Mages.  Shick M.D.   On: 06/11/2019 13:10   NM PET Image Initial (PI) Skull Base To Thigh  Result Date: 06/07/2019 CLINICAL DATA:  Initial treatment strategy for neck mass. EXAM: NUCLEAR MEDICINE PET SKULL BASE TO THIGH TECHNIQUE: 6.57 mCi F-18 FDG was injected intravenously. Full-ring PET imaging was performed from the skull base to thigh after the radiotracer. CT data was obtained and used for attenuation correction and anatomic localization. Fasting blood glucose: The 56 mg/dl COMPARISON:  CT 05/27/2019 FINDINGS: Mediastinal blood pool activity: SUV max 2.39 Liver activity: SUV max NA NECK: Base of tongue mass extending into the right peer form sinus is identified. This has an SUV max of 12.69. Multiple right-sided FDG avid lymph nodes are identified. Index right level 2 node within the measures 2.5 cm within SUV max of 5.7. Right level 2 node posterior and medial to the parotid gland measures 2.2 cm within SUV max of 9.6. Right FDG avid node at the level of the piriform sinus measures 2.2 cm within SUV max of 5.8. Incidental CT findings: none CHEST: No FDG avid axillary, supraclavicular, mediastinal, or hilar lymph nodes. Within the posterolateral left upper lobe there is a nodule with central cavitary component measuring 1 cm. This has an SUV max of 1.8, image 89/3. Incidental CT findings: A few scattered calcified pleural plaques are noted suggesting previous asbestos exposure. Aortic atherosclerosis. Three vessel coronary artery atherosclerotic calcifications. ABDOMEN/PELVIS: No abnormal hypermetabolic activity within the liver, pancreas, adrenal glands, or spleen. No hypermetabolic  lymph nodes in the abdomen or pelvis. Incidental CT findings: Aortic atherosclerosis. Seed implants noted within the prostate gland. SKELETON: No focal hypermetabolic activity to suggest skeletal metastasis. Incidental CT findings: none IMPRESSION: 1. 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 2. Multiple FDG avid cervical lymph nodes compatible with ipsilateral nodal metastasis. No FDG avid left cervical lymph nodes identified. 3. Left upper lobe pulmonary nodule exhibits mild FDG uptake within SUV max of 1.8. Cannot rule out pulmonary metastasis. 4. 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 No findings of pulmonary  asbestosis. 5. Coronary artery calcifications 6.  Aortic Atherosclerosis (ICD10-I70.0). Electronically Signed   By: Kerby Moors M.D.   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 A total of Versed 1.0 mg and Fentanyl 50 mcg was administered intravenously. Moderate Sedation Time: 12 minutes. The patient's level of consciousness and vital signs were monitored continuously by radiology nursing throughout the procedure under my direct supervision. FLUOROSCOPY TIME:  Fluoroscopy Time: None. COMPLICATIONS: None immediate. PROCEDURE: Informed written consent was obtained from the patient after a thorough discussion of the procedural risks, benefits and alternatives. All questions were addressed. Maximal Sterile Barrier Technique was utilized including caps, mask, sterile gowns,  sterile gloves, sterile drape, hand hygiene and skin antiseptic. A timeout was performed prior to the initiation of the procedure. Previous imaging reviewed. Preliminary ultrasound performed. The right cervical abnormal adenopathy was localized and marked. Ultrasound aspiration: Under sterile conditions and local anesthesia, an 18 gauge needle was advanced into the cystic necrotic portion of the adenopathy. Syringe aspiration yielded 2 cc exudative fluid. Sample sent for cytology. Needle removed. Ultrasound core biopsy: Under sterile conditions and local anesthesia, an 18 gauge core biopsy needle was advanced to the residual component of the right cervical adenopathy. 18 gauge core biopsies obtained. These were placed in formalin. Needle removed. Postprocedure imaging demonstrates no hemorrhage or hematoma. Patient tolerated biopsy well. IMPRESSION: Successful ultrasound aspiration for cytology and core biopsy for pathology of the right neck necrotic cervical adenopathy. Electronically Signed   By: Jerilynn Mages.  Shick M.D.   On: 06/11/2019 13:10    Labs:  CBC: Recent Labs    05/12/19 1603  WBC 5.8  HGB 12.7*  HCT 39.5  PLT 222    COAGS: Recent Labs    05/12/19 1603 05/28/19 1326  INR 1.1 1.1  APTT 31 33    BMP: Recent Labs    05/12/19 1603  NA 139  K 4.0  CL 103  CO2 28  GLUCOSE 79  BUN 14  CALCIUM 9.4  CREATININE 0.63  GFRNONAA >60  GFRAA >60    LIVER FUNCTION TESTS: Recent Labs    05/12/19 1603  BILITOT 1.2  AST 20  ALT 16  ALKPHOS 57  PROT 7.7  ALBUMIN 4.0    TUMOR MARKERS: No results for input(s): AFPTM, CEA, CA199, CHROMGRNA in the last 8760 hours.  Assessment and Plan:  79 y/o M with recently diagnosed squamous cell carcinoma of the head and neck who presents today for a left upper lobe lung biopsy to further evaluate if this area of mild FDG uptake seen on recent PET is metastasis vs primary lung cancer to further guide treatment.  Patient has been NPO since  midnight, no blood thinning medications. Afebrile, WBC 5.5, hgb 11.9, plt 220, INR pending.  Risks and benefits discussed with the patient including, but not limited to bleeding, hemoptysis, respiratory failure requiring intubation, infection, pneumothorax requiring chest tube placement, stroke from air embolism or even death.  All of the patient's questions were answered, patient is agreeable to proceed.  Consent signed and in chart.  Thank you for this interesting consult.  I greatly enjoyed meeting [employer] and look forward to participating in  their care.  A copy of this report was sent to the requesting provider on this date.  Electronically Signed: Joaquim Nam, PA-C 06/29/2019, 8:17 AM   I spent a total of 25 Minutes in face to face in clinical consultation, greater than 50% of which was counseling/coordinating care for lung biopsy.

## 2019-06-29 NOTE — Procedures (Signed)
Interventional Radiology Procedure Note  Procedure: CT Guided Biopsy of LUL lung nodule  Complications: None  Estimated Blood Loss: < 10 mL  Findings: 18 G core biopsy of LUL lung nodule performed under CT guidance.  Two core samples obtained and sent to Pathology. Tiny PTX completely aspirated via 17 G outer trocar needle on completion.  Venetia Night. Kathlene Cote, M.D Pager:  [phone]

## 2019-06-30 ENCOUNTER — Ambulatory Visit
Admission: RE | Admit: 2019-06-30 | Discharge: 2019-06-30 | Disposition: A | Discharge status: Home / Self Care | Payer: Medicare Other | Source: Ambulatory Visit | Attending: Radiation Oncology | Admitting: Radiation Oncology

## 2019-06-30 ENCOUNTER — Encounter: Payer: Self-pay | Admitting: Radiation Oncology

## 2019-06-30 VITALS — BP 105/83 | HR 64 | Temp 98.4°F | Resp 16 | Wt 111.1 lb

## 2019-06-30 DIAGNOSIS — E7800 Pure hypercholesterolemia, unspecified: Secondary | ICD-10-CM | POA: Diagnosis not present

## 2019-06-30 DIAGNOSIS — Z8546 Personal history of malignant neoplasm of prostate: Secondary | ICD-10-CM | POA: Insufficient documentation

## 2019-06-30 DIAGNOSIS — Z79899 Other long term (current) drug therapy: Secondary | ICD-10-CM | POA: Diagnosis not present

## 2019-06-30 DIAGNOSIS — C109 Malignant neoplasm of oropharynx, unspecified: Secondary | ICD-10-CM | POA: Insufficient documentation

## 2019-06-30 DIAGNOSIS — F1010 Alcohol abuse, uncomplicated: Secondary | ICD-10-CM | POA: Insufficient documentation

## 2019-06-30 DIAGNOSIS — Z87891 Personal history of nicotine dependence: Secondary | ICD-10-CM | POA: Diagnosis not present

## 2019-06-30 LAB — SURGICAL PATHOLOGY

## 2019-06-30 NOTE — Consult Note (Signed)
NEW PATIENT EVALUATION  Name: Gregory Burton  MRN: [id number]  Date:   06/30/2019     DOB: September 06, 1940   This 79 y.o. 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  REFERRING PHYSICIAN: Casilda Carls, MD  CHIEF COMPLAINT:  Chief Complaint  Patient presents with  . 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   PREVIOUS INVESTIGATIONS:  PET CT scan reviewed Pathology report reviewed Clinical notes reviewed  HPI: Patient is a 79 year old male recently moved from [place].  He has had increasing sore throat dysphagia and some adenopathy in his neck on the right side.  CT scan head and neck showed solid enhancing mass in the right piriform sinus measuring 1.7 cm highly suspicious for carcinoma.  There were multiple lymph nodes in the right neck including cystic nodes in the right posterior nasopharynx and right lateral neck.  He also had a 1 cm spiculated mass in the left upper lobe.  PET CT scan demonstrated hypermetabolic mass in the right base of tongue extending into the right piriform sinus compatible with primary head and neck cancer.  He had multiple hypermetabolic nodes in the right cervical chain no hypermetabolic nodes in the left cervical chain.  Left upper lobe pulmonary nodule exhibits mild hypermetabolic activity.  Both areas were biopsied both areas was positive for squamous cell carcinoma p16 negative for the head and neck cancer.  He is seen today for radiation oncology consultation he has been seen by medical oncology who is recommended concurrent chemoradiation.  Patient is having a mild sore throat no specific dysphagia at this time.  PLANNED TREATMENT REGIMEN: Concurrent chemoradiation  PAST MEDICAL HISTORY:  has a past medical history of Alcohol abuse, Blood transfusion without reported  diagnosis (2013), Cataract, Glaucoma, Gout, Hypercholesteremia, and Prostate CA ([place]) (2008).    PAST SURGICAL HISTORY:  Past Surgical History:  Procedure Laterality Date  . BRAIN SURGERY  2012  . HERNIA REPAIR      FAMILY HISTORY: family history is not on file.  SOCIAL HISTORY:  reports that he quit smoking about 11 years ago. His smoking use included cigarettes. He quit after 21.00 years of use. He has never used smokeless tobacco. He reports current alcohol use. He reports that he does not use drugs.  ALLERGIES: Enalapril  MEDICATIONS:  Current Outpatient Medications  Medication Sig Dispense Refill  . atorvastatin (LIPITOR) 20 MG tablet Take 20 mg by mouth daily.    Marland Kitchen donepezil (ARICEPT) 10 MG tablet Take 10 mg by mouth at bedtime.    . dorzolamide-timolol (COSOPT) 22.3-6.8 MG/ML ophthalmic solution 1 drop 2 (two) times daily.    . folic acid (FOLVITE) 1 MG tablet Take 1 mg by mouth daily.    Marland Kitchen latanoprost (XALATAN) 0.005 % ophthalmic solution 1 drop at bedtime.    . Thiamine HCl (VITAMIN B-1 PO) Take by mouth. 100 mg QD    . Vitamin D, Cholecalciferol, 50 MCG (2000 UT) CAPS Take 1 capsule by mouth.    . predniSONE (DELTASONE) 5 MG tablet Take 5 mg by mouth daily with breakfast. As needed for gout flare     No current facility-administered medications for this encounter.    ECOG PERFORMANCE STATUS:  1 - Symptomatic but completely ambulatory  REVIEW OF SYSTEMS: Patient denies any weight loss, fatigue, weakness, fever, chills or  night sweats. Patient denies any loss of vision, blurred vision. Patient denies any ringing  of the ears or hearing loss. No irregular heartbeat. Patient denies heart murmur or history of fainting. Patient denies any chest pain or pain radiating to her upper extremities. Patient denies any shortness of breath, difficulty breathing at night, cough or hemoptysis. Patient denies any swelling in the lower legs. Patient denies any nausea vomiting, vomiting of  blood, or coffee ground material in the vomitus. Patient denies any stomach pain. Patient states has had normal bowel movements no significant constipation or diarrhea. Patient denies any dysuria, hematuria or significant nocturia. Patient denies any problems walking, swelling in the joints or loss of balance. Patient denies any skin changes, loss of hair or loss of weight. Patient denies any excessive worrying or anxiety or significant depression. Patient denies any problems with insomnia. Patient denies excessive thirst, polyuria, polydipsia. Patient denies any swollen glands, patient denies easy bruising or easy bleeding. Patient denies any recent infections, allergies or URI. Patient "s visual fields have not changed significantly in recent time.   PHYSICAL EXAM: BP 105/83 (BP Location: Left Arm, Patient Position: Sitting)   Pulse 64   Temp 98.4 F (36.9 C) (Tympanic)   Resp 16   Wt 111 lb 1.6 oz (50.4 kg)   BMI 17.40 kg/m  Thin slightly tactic male in NAD.  He has prominent adenopathy in the right cervical chain.  No supraclavicular adenopathy identified.  Well-developed well-nourished patient in NAD. HEENT reveals PERLA, EOMI, discs not visualized.  Oral cavity is clear. No oral mucosal lesions are identified. Neck is clear without evidence of cervical or supraclavicular adenopathy. Lungs are clear to A&P. Cardiac examination is essentially unremarkable with regular rate and rhythm without murmur rub or thrill. Abdomen is benign with no organomegaly or masses noted. Motor sensory and DTR levels are equal and symmetric in the upper and lower extremities. Cranial nerves II through XII are grossly intact. Proprioception is intact. No peripheral adenopathy or edema is identified. No motor or sensory levels are noted. Crude visual fields are within normal range.  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  PLAN: At this time agree with concurrent chemoradiation therapy I would plan on delivering 7000 cGy to areas of hypermetabolic activity the base of tongue piriform sinus as well as his right cervical lymph nodes.  I would treat remainder of his head and neck nodes to 5400 cGy using IMRT treatment planning and delivery I would choose IMRT to spare critical structures such as a spinal cord salivary glands and oral cavity.  Risks and benefits of treatment including loss of taste slight xerostomia skin reaction fatigue alteration of blood counts all were described in detail to the patient and his wife.  I have personally set up and ordered CT simulation.  I would use a PET CT fusion study for treatment planning.  Patient will receive concurrent chemoradiation with probable cisplatin based regimen during radiation.  There will be extra effort by both professional staff as well as technical staff to coordinate and manage concurrent chemoradiation and ensuing side effects during his treatments. I after completion of head and neck cancer would address his lung cancer and probably opt to treat SBRT 6000 cGy in 5 fractions.  I would like to  take this opportunity to thank you for allowing me to participate in the care of your patient.Noreene Filbert, MD

## 2019-07-01 ENCOUNTER — Telehealth: Payer: Self-pay | Admitting: Oncology

## 2019-07-01 NOTE — Telephone Encounter (Signed)
Discussed with patient's son Jesse Fall.  Per son, patient declines prophylactic feeding tube placement.  Patient agrees with proceeding with cisplatin treatment concurrently with radiation. He is also not interested in Mediport placement.  Patient and son are aware about not having good IV access may further delay his treatments.

## 2019-07-05 ENCOUNTER — Telehealth: Payer: Self-pay | Admitting: Speech Pathology

## 2019-07-06 ENCOUNTER — Telehealth: Payer: Self-pay | Admitting: Speech Pathology

## 2019-07-06 MED ORDER — PROCHLORPERAZINE MALEATE 10 MG PO TABS: 10.0000 mg | ORAL_TABLET | Freq: Four times a day (QID) | ORAL | 1 refills | Status: DC | PRN

## 2019-07-06 NOTE — Telephone Encounter (Signed)
Speech Therapy Note:  Telephone Screen; 07/02/2019  Contacted pt via telephone re: referral received from team member at [facility]. Pt is a 79 y.o. male with a past medical history significant for glaucoma, cataracts, ETOH abuse, HLD, gout, prostate cancer s/p radiation/seed placement and recently diagnosed squamous cell carcinoma of the head/neck. Pt had noted swelling on the right side of his neck approximately a month ago which was sore, caused him to cough and had a strange sensation when swallowing - he underwent a biopsy of this mass in IR on 06/11/2019 and pathology from this biopsy confirmed squamous cell carcinoma. He reported to MD that he has trouble eating anything but soft foods. He had multiple teeth removed, received Denture plate, but his gums are sore - his Dentures do not fit securely per his report. He stated he has been eating mostly mashed potatoes, soups, and drinking Ensure and water at home. We discussed his pharyngeal swallowing; he denied any choking or coughing when swallowing (liquids questioned especially). No infiltrates noted on recent CXR 06/29/2019. Pt denied any upper respiratory congestion as well. Further discussion was had on the oral phase of swallowing, primarily his oral phase management of foods. Pt's primary complaint was the discomfort he feels when eating solid foods, especially when wearing his Dentures. He stated the Dentures are "uncomfortable in my mouth" - suspect ill-fit as any weight loss can change the gumline and fit of Dentures. Discussed his diet and food consistencies: foods that were pureed in consistency and liquids were easiest. He stated he continued to try to eat well-cooked foods also but it was sometimes his gums were "sore". Educated pt on [place] foods as well as finding more pureed foods in his diet(ie, hot cereals, custards, puddings, yogurts) and even trying egg salad and pimento cheese w/out breads for easier mastication. Educated pt on the  importance of breaking down any food as much as possible IF not wearing the Dentures to avoid swallowing foods whole, and for safety of swallowing. Encouraged pt to use more gravies on foods, moisture from soups. Pt stated he was drinking Ensure - encouraged him to use if w/ ice cream for milkshakes as well.  Speech Therapy will f/u w/ pt for ongoing monitoring of swallowing status while in treatment at the [facility]. Pt agreed.    Orinda Kenner, MS, CCC-SLP Speech Therapy

## 2019-07-06 NOTE — Telephone Encounter (Signed)
This telephone encounter was created in error. See note on 07/06/2019.   Orinda Kenner, [place], CCC-SLP

## 2019-07-06 NOTE — Addendum Note (Signed)
Addended by: Earlie Server on: 07/06/2019 02:24 PM   Modules accepted: Orders

## 2019-07-06 NOTE — Telephone Encounter (Signed)
This telephone encounter was created in error. See note on 07/06/2019.   Orinda Kenner, [place], CCC-SLP

## 2019-07-07 ENCOUNTER — Telehealth: Payer: Self-pay

## 2019-07-07 ENCOUNTER — Ambulatory Visit
Admission: RE | Admit: 2019-07-07 | Discharge: 2019-07-07 | Disposition: A | Discharge status: Home / Self Care | Payer: Medicare Other | Source: Ambulatory Visit | Attending: Radiation Oncology | Admitting: Radiation Oncology

## 2019-07-07 DIAGNOSIS — C12 Malignant neoplasm of pyriform sinus: Secondary | ICD-10-CM | POA: Insufficient documentation

## 2019-07-07 DIAGNOSIS — Z510 Encounter for antineoplastic radiation therapy: Secondary | ICD-10-CM | POA: Insufficient documentation

## 2019-07-07 NOTE — Telephone Encounter (Signed)
Done...  Pts appt for lab/MD/ *NEW* Cisplatin on 5/20 has been scheduled as requested Called and left a detailed message on pts vmail to make him aware. A new appt reminder letter will be mailed out also.

## 2019-07-07 NOTE — Telephone Encounter (Signed)
Please schedule patient for lab/MD/cisplatin *NEW* on 5/20 and notify pt's wife. Pt rides [employer].

## 2019-07-08 NOTE — Progress Notes (Signed)
Pharmacist Chemotherapy Monitoring - Initial Assessment    Anticipated start date: 07/15/19  Regimen:  . Are orders appropriate based on the patient's diagnosis, regimen, and cycle? Yes . Does the plan date match the patient's scheduled date? Yes . Is the sequencing of drugs appropriate? Yes . Are the premedications appropriate for the patient's regimen? Yes . Prior Authorization for treatment is: Approved o If applicable, is the correct biosimilar selected based on the patient's insurance? not applicable  Organ Function and Labs: Marland Kitchen Are dose adjustments needed based on the patient's renal function, hepatic function, or hematologic function? No . Are appropriate labs ordered prior to the start of patient's treatment? Yes . Other organ system assessment, if indicated: cisplatin: baseline audiogram . The following baseline labs, if indicated, have been ordered: N/A  Dose Assessment: . Are the drug doses appropriate? Yes . Are the following correct: o Drug concentrations Yes o IV fluid compatible with drug Yes o Administration routes Yes o Timing of therapy Yes . If applicable, does the patient have documented access for treatment and/or plans for port-a-cath placement? no . If applicable, have lifetime cumulative doses been properly documented and assessed? no Lifetime Dose Tracking  No doses have been documented on this patient for the following tracked chemicals: Doxorubicin, Epirubicin, Idarubicin, Daunorubicin, Mitoxantrone, Bleomycin, Oxaliplatin, Carboplatin, Liposomal Doxorubicin  o   Toxicity Monitoring/Prevention: . The patient has the following take home antiemetics prescribed: Prochlorperazine . The patient has the following take home medications prescribed: N/A . Medication allergies and previous infusion related reactions, if applicable, have been reviewed and addressed. Yes . The patient's current medication list has been assessed for drug-drug interactions with their  chemotherapy regimen. no significant drug-drug interactions were identified on review.  Order Review: . Are the treatment plan orders signed? Yes . Is the patient scheduled to see a provider prior to their treatment? Yes  I verify that I have reviewed each item in the above checklist and answered each question accordingly.  Gregory Burton 07/08/2019 9:27 AM

## 2019-07-12 DIAGNOSIS — Z510 Encounter for antineoplastic radiation therapy: Secondary | ICD-10-CM | POA: Diagnosis not present

## 2019-07-14 ENCOUNTER — Other Ambulatory Visit: Payer: Self-pay

## 2019-07-14 DIAGNOSIS — C109 Malignant neoplasm of oropharynx, unspecified: Secondary | ICD-10-CM

## 2019-07-15 ENCOUNTER — Inpatient Hospital Stay: Payer: Medicare Other | Attending: Oncology

## 2019-07-15 ENCOUNTER — Inpatient Hospital Stay: Payer: Medicare Other | Admitting: Oncology

## 2019-07-15 ENCOUNTER — Inpatient Hospital Stay: Payer: Medicare Other

## 2019-07-15 ENCOUNTER — Telehealth: Payer: Self-pay

## 2019-07-15 ENCOUNTER — Ambulatory Visit: Admission: RE | Admit: 2019-07-15 | Payer: Medicare Other | Source: Ambulatory Visit

## 2019-07-15 DIAGNOSIS — R112 Nausea with vomiting, unspecified: Secondary | ICD-10-CM | POA: Insufficient documentation

## 2019-07-15 DIAGNOSIS — Z681 Body mass index (BMI) 19 or less, adult: Secondary | ICD-10-CM | POA: Insufficient documentation

## 2019-07-15 DIAGNOSIS — F1020 Alcohol dependence, uncomplicated: Secondary | ICD-10-CM | POA: Insufficient documentation

## 2019-07-15 DIAGNOSIS — Z5111 Encounter for antineoplastic chemotherapy: Secondary | ICD-10-CM | POA: Insufficient documentation

## 2019-07-15 DIAGNOSIS — C3492 Malignant neoplasm of unspecified part of left bronchus or lung: Secondary | ICD-10-CM | POA: Insufficient documentation

## 2019-07-15 DIAGNOSIS — C109 Malignant neoplasm of oropharynx, unspecified: Secondary | ICD-10-CM | POA: Insufficient documentation

## 2019-07-15 NOTE — Telephone Encounter (Signed)
Done.. Called and made pt wife Maisen Klingler  aware of his sched 07/19/19 appts.

## 2019-07-15 NOTE — Telephone Encounter (Signed)
Per [place] from [place], RT: Pt is scheduled to start his 1st radiation treatment on Monday 5/24.   Pllease cancel appts today and move lab/MD/ Cisplatin *NEW* to Monday or Tuesday. Please call pt to notify him of appt details. thank you.

## 2019-07-19 ENCOUNTER — Other Ambulatory Visit: Payer: Self-pay

## 2019-07-19 ENCOUNTER — Inpatient Hospital Stay: Payer: Medicare Other

## 2019-07-19 ENCOUNTER — Encounter: Payer: Self-pay | Admitting: Oncology

## 2019-07-19 ENCOUNTER — Ambulatory Visit
Admission: RE | Admit: 2019-07-19 | Discharge: 2019-07-19 | Disposition: A | Discharge status: Home / Self Care | Payer: Medicare Other | Source: Ambulatory Visit | Attending: Radiation Oncology | Admitting: Radiation Oncology

## 2019-07-19 ENCOUNTER — Inpatient Hospital Stay (HOSPITAL_BASED_OUTPATIENT_CLINIC_OR_DEPARTMENT_OTHER): Payer: Medicare Other | Admitting: Oncology

## 2019-07-19 VITALS — BP 142/73 | HR 71 | Temp 97.7°F | Resp 16 | Wt 112.0 lb

## 2019-07-19 DIAGNOSIS — Z5111 Encounter for antineoplastic chemotherapy: Secondary | ICD-10-CM | POA: Diagnosis present

## 2019-07-19 DIAGNOSIS — Z510 Encounter for antineoplastic radiation therapy: Secondary | ICD-10-CM | POA: Diagnosis not present

## 2019-07-19 DIAGNOSIS — C109 Malignant neoplasm of oropharynx, unspecified: Secondary | ICD-10-CM

## 2019-07-19 DIAGNOSIS — C3492 Malignant neoplasm of unspecified part of left bronchus or lung: Secondary | ICD-10-CM

## 2019-07-19 DIAGNOSIS — F1020 Alcohol dependence, uncomplicated: Secondary | ICD-10-CM | POA: Diagnosis not present

## 2019-07-19 DIAGNOSIS — R112 Nausea with vomiting, unspecified: Secondary | ICD-10-CM | POA: Diagnosis not present

## 2019-07-19 DIAGNOSIS — Z681 Body mass index (BMI) 19 or less, adult: Secondary | ICD-10-CM | POA: Diagnosis not present

## 2019-07-19 DIAGNOSIS — Z7189 Other specified counseling: Secondary | ICD-10-CM | POA: Diagnosis not present

## 2019-07-19 DIAGNOSIS — F1010 Alcohol abuse, uncomplicated: Secondary | ICD-10-CM

## 2019-07-19 LAB — CBC WITH DIFFERENTIAL/PLATELET
Abs Immature Granulocytes: 0.01 10*3/uL (ref 0.00–0.07)
Basophils Absolute: 0 10*3/uL (ref 0.0–0.1)
Basophils Relative: 1 %
Eosinophils Absolute: 0.2 10*3/uL (ref 0.0–0.5)
Eosinophils Relative: 2 %
HCT: 38 % — ABNORMAL LOW (ref 39.0–52.0)
Hemoglobin: 12.2 g/dL — ABNORMAL LOW (ref 13.0–17.0)
Immature Granulocytes: 0 %
Lymphocytes Relative: 22 %
Lymphs Abs: 1.4 10*3/uL (ref 0.7–4.0)
MCH: 29.6 pg (ref 26.0–34.0)
MCHC: 32.1 g/dL (ref 30.0–36.0)
MCV: 92.2 fL (ref 80.0–100.0)
Monocytes Absolute: 0.5 10*3/uL (ref 0.1–1.0)
Monocytes Relative: 9 %
Neutro Abs: 4.1 10*3/uL (ref 1.7–7.7)
Neutrophils Relative %: 66 %
Platelets: 230 10*3/uL (ref 150–400)
RBC: 4.12 MIL/uL — ABNORMAL LOW (ref 4.22–5.81)
RDW: 12 % (ref 11.5–15.5)
WBC: 6.1 10*3/uL (ref 4.0–10.5)
nRBC: 0 % (ref 0.0–0.2)

## 2019-07-19 LAB — COMPREHENSIVE METABOLIC PANEL
ALT: 17 U/L (ref 0–44)
AST: 20 U/L (ref 15–41)
Albumin: 3.7 g/dL (ref 3.5–5.0)
Alkaline Phosphatase: 62 U/L (ref 38–126)
Anion gap: 8 (ref 5–15)
BUN: 11 mg/dL (ref 8–23)
CO2: 28 mmol/L (ref 22–32)
Calcium: 9.3 mg/dL (ref 8.9–10.3)
Chloride: 104 mmol/L (ref 98–111)
Creatinine, Ser: 0.79 mg/dL (ref 0.61–1.24)
GFR calc Af Amer: >60 mL/min (ref >60)
GFR calc non Af Amer: >60 mL/min (ref >60)
Glucose, Bld: 108 mg/dL — ABNORMAL HIGH (ref 70–99)
Potassium: 4.6 mmol/L (ref 3.5–5.1)
Sodium: 140 mmol/L (ref 135–145)
Total Bilirubin: 1.2 mg/dL (ref 0.3–1.2)
Total Protein: 7.8 g/dL (ref 6.5–8.1)

## 2019-07-19 MED ORDER — PALONOSETRON HCL INJECTION 0.25 MG/5ML
0.2500 mg | Freq: Once | INTRAVENOUS | Status: AC
  Administered 2019-07-19: 0.25 mg via INTRAVENOUS
  Filled 2019-07-19: qty 5

## 2019-07-19 MED ORDER — POTASSIUM CHLORIDE 2 MEQ/ML IV SOLN
Freq: Once | INTRAVENOUS | Status: AC
  Filled 2019-07-19: qty 1000

## 2019-07-19 MED ORDER — SODIUM CHLORIDE 0.9 % IV SOLN
75.0000 mg | Freq: Once | INTRAVENOUS | Status: AC
  Administered 2019-07-19: 75 mg via INTRAVENOUS
  Filled 2019-07-19: qty 75

## 2019-07-19 MED ORDER — SODIUM CHLORIDE 0.9 % IV SOLN
150.0000 mg | Freq: Once | INTRAVENOUS | Status: AC
  Administered 2019-07-19: 150 mg via INTRAVENOUS
  Filled 2019-07-19: qty 150

## 2019-07-19 MED ORDER — SODIUM CHLORIDE 0.9 % IV SOLN
10.0000 mg | Freq: Once | INTRAVENOUS | Status: AC
  Administered 2019-07-19: 10 mg via INTRAVENOUS
  Filled 2019-07-19: qty 10

## 2019-07-19 MED ORDER — SODIUM CHLORIDE 0.9 % IV SOLN
Freq: Once | INTRAVENOUS | Status: AC
  Filled 2019-07-19: qty 250

## 2019-07-19 NOTE — Progress Notes (Signed)
Patient here for oncology follow-up appointment, expresses  concerns of new chemo, covid vaccine with chemo and diet concerns.

## 2019-07-19 NOTE — Progress Notes (Signed)
Hematology/Oncology Consult note [facility] [phone] [phone]   Patient Care Team: Casilda Carls, MD as PCP - General (Internal Medicine) Earlie Server, [name], [name]  HISTORY OF PRESENTING ILLNESS:   Gregory Burton is a  79 y.o.  male with PMH listed below was seen in consultation at the request of  Casilda Carls, MD  for evaluation of lymph node.  Patient moved from [place] to [place] for about 3 weeks. He has noticed a neck knot on the right side of his neck. The knot is sore and makes him to cough and he feels it when he swallows. No swallowing difficulty.  Patient was accompanied by his wife. She reports that patient's previous PCP has done neck sonogram for evaluation.  Patient also had right lower molar tooth extraction done a few weeks before he noticed  He also took a course of antibiotics.  Due to his tooth pain, his appetite has decreased and he has lost weight loss, about 20 pounds, he can not specify the time frame.   Alcoholism History of prostate cancer. S/p Radiation/seed, he follows up with [facility].   INTERVAL HISTORY Gregory Burton is a 79 y.o. male who has above history reviewed by me today presents for follow up visit for management of head and neck cancer and lung cancer Patient has had chemotherapy education. He and family decide not to proceed with feeding tube placement and Mediport placement at this point. Patient continues to drink alcohol daily, he reports 1 shot of hard liquor. Appetite is good and he eats well.   Review of Systems  Constitutional: Positive for fatigue and unexpected weight change. Negative for appetite change, chills and fever.  HENT:   Negative for hearing loss and voice change.   Eyes: Negative for eye problems and icterus.  Respiratory:  Negative for chest tightness, cough and shortness of breath.   Cardiovascular: Negative for chest pain and leg swelling.  Gastrointestinal: Negative for abdominal distention and abdominal pain.  Endocrine: Negative for hot flashes.  Genitourinary: Negative for difficulty urinating, dysuria and frequency.   Musculoskeletal: Negative for arthralgias.  Skin: Negative for itching and rash.  Neurological: Negative for light-headedness and numbness.  Hematological: Negative for adenopathy. Does not bruise/bleed easily.  Psychiatric/Behavioral: Negative for confusion.    MEDICAL HISTORY:  Past Medical History:  Diagnosis Date  . Alcohol abuse   . Blood transfusion without reported diagnosis 2013  . Cataract   . Glaucoma   . Gout   . Hypercholesteremia   . Prostate CA [facility]) 2008    SURGICAL HISTORY: Past Surgical History:  Procedure Laterality Date  . BRAIN SURGERY  2012  . HERNIA REPAIR      SOCIAL HISTORY: Social History   Socioeconomic History  . Marital status: Married    Spouse name: Not on file  . Number of children: Not on file  . Years of education: Not on file  . Highest education level: Not on file  Occupational History  . Not on file  Tobacco Use  . Smoking status: Former Smoker    Years: 21.00    Types: Cigarettes    Quit date: 05/11/2008    Years since quitting: 11.1  . Smokeless tobacco: Never Used  Substance and Sexual Activity  . Alcohol use: Yes    Comment: 0.5 pint liquor a week  .  Drug use: Never  . Sexual activity: Not Currently  Other Topics Concern  . Not on file  Social History Narrative  . Not on file   Social Determinants of Health   Financial Resource Strain:   . Difficulty of Paying Living Expenses:   Food Insecurity:   . Worried About Charity fundraiser in the Last Year:   . Arboriculturist in the Last Year:   Transportation Needs:   . Film/video editor (Medical):   Marland Kitchen Lack of Transportation (Non-Medical):   Physical  Activity:   . Days of Exercise per Week:   . Minutes of Exercise per Session:   Stress:   . Feeling of Stress :   Social Connections:   . Frequency of Communication with Friends and Family:   . Frequency of Social Gatherings with Friends and Family:   . Attends Religious Services:   . Active Member of Clubs or Organizations:   . Attends Archivist Meetings:   Marland Kitchen Marital Status:   Intimate Partner Violence:   . Fear of Current or Ex-Partner:   . Emotionally Abused:   Marland Kitchen Physically Abused:   . Sexually Abused:     FAMILY HISTORY: No family history on file.  ALLERGIES:  is allergic to enalapril.  MEDICATIONS:  Current Outpatient Medications  Medication Sig Dispense Refill  . atorvastatin (LIPITOR) 20 MG tablet Take 20 mg by mouth daily.    Marland Kitchen donepezil (ARICEPT) 10 MG tablet Take 10 mg by mouth at bedtime.    . dorzolamide-timolol (COSOPT) 22.3-6.8 MG/ML ophthalmic solution 1 drop 2 (two) times daily.    . folic acid (FOLVITE) 1 MG tablet Take 1 mg by mouth daily.    Marland Kitchen latanoprost (XALATAN) 0.005 % ophthalmic solution 1 drop at bedtime.    . predniSONE (DELTASONE) 5 MG tablet Take 5 mg by mouth daily with breakfast. As needed for gout flare    . prochlorperazine (COMPAZINE) 10 MG tablet Take 1 tablet (10 mg total) by mouth every 6 (six) hours as needed (Nausea or vomiting). 30 tablet 1  . Thiamine HCl (VITAMIN B-1 PO) Take by mouth. 100 mg QD    . Vitamin D, Cholecalciferol, 50 MCG (2000 UT) CAPS Take 1 capsule by mouth.     No current facility-administered medications for this visit.     PHYSICAL EXAMINATION: ECOG PERFORMANCE STATUS: 1 - Symptomatic but completely ambulatory Vitals:   07/19/19 0924  BP: (!) 142/73  Pulse: 71  Resp: 16  Temp: 97.7 F (36.5 C)  SpO2: 100%   Filed Weights   07/19/19 0924  Weight: 112 lb (50.8 kg)    Physical Exam Constitutional:      General: He is not in acute distress. HENT:     Head: Normocephalic and atraumatic.    Eyes:     General: No scleral icterus. Neck:     Comments: Palpable 1-2cm right neck mass  Cardiovascular:     Rate and Rhythm: Normal rate and regular rhythm.     Heart sounds: Normal heart sounds.  Pulmonary:     Effort: Pulmonary effort is normal. No respiratory distress.     Breath sounds: No wheezing.  Abdominal:     General: Bowel sounds are normal. There is no distension.     Palpations: Abdomen is soft.  Musculoskeletal:        General: No deformity. Normal range of motion.     Cervical back: Normal range of motion and neck supple.  Skin:    General: Skin is warm and dry.     Findings: No erythema or rash.  Neurological:     Mental Status: He is alert and oriented to person, place, and time. Mental status is at baseline.     Cranial Nerves: No cranial nerve deficit.     Coordination: Coordination normal.  Psychiatric:        Mood and Affect: Mood normal.     LABORATORY DATA:  I have reviewed the data as listed Lab Results  Component Value Date   WBC 5.5 06/29/2019   HGB 11.9 (L) 06/29/2019   HCT 36.9 (L) 06/29/2019   MCV 92.9 06/29/2019   PLT 220 06/29/2019   Recent Labs    05/12/19 1603  NA 139  K 4.0  CL 103  CO2 28  GLUCOSE 79  BUN 14  CREATININE 0.63  CALCIUM 9.4  GFRNONAA >60  GFRAA >60  PROT 7.7  ALBUMIN 4.0  AST 20  ALT 16  ALKPHOS 57  BILITOT 1.2   Iron/TIBC/Ferritin/ %Sat No results found for: IRON, TIBC, FERRITIN, IRONPCTSAT    RADIOGRAPHIC STUDIES: I have personally reviewed the radiological images as listed and agreed with the findings in the report. 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 Staging imaging as also demonstrated a roughly 1 cm cavitary nodule in the left upper lobe. The patient presents for biopsy to determine whether this represents metastatic disease, primary lung carcinoma or benign nodule. EXAM: CT GUIDED CORE BIOPSY OF LEFT UPPER LOBE  LUNG NODULE ANESTHESIA/SEDATION: 2.0 mg IV Versed; 100 mcg IV Fentanyl Total Moderate Sedation Time:  22 minutes. The patient's level of consciousness and physiologic status were continuously monitored during the procedure by Radiology nursing. PROCEDURE: The procedure risks, benefits, and alternatives were explained to the patient and his wife. Questions regarding the procedure were encouraged and answered. The patient and his wife understand and consent to the procedure. A time-out was performed prior to initiating the procedure. Supine imaging was performed through the upper to mid chest. The left upper chest wall was prepped with chlorhexidine in a sterile fashion, and a sterile drape was applied covering the operative field. A sterile gown and sterile gloves were used for the procedure. Local anesthesia was provided with 1% Lidocaine. Under CT guidance, a 17 gauge trocar needle was advanced to the level of a left upper lobe pulmonary nodule. After confirming needle tip position, 2 separate coaxial 18 gauge core biopsy samples were obtained. Additional imaging was performed. Aspiration was then performed via the outer needle at the level of the pleural space. Additional CT was performed after needle removal. COMPLICATIONS: Tiny left lateral pneumothorax. SIR level B: Nominal therapy (including overnight admission for observation), no consequence. FINDINGS: Small lateral and posterior left upper lobe nodule again demonstrated which measures approximately 9 x 12 mm. This nodule demonstrates central cavitation. One small tissue fragment and a second more intact core biopsy sample were able to be obtained. During the procedure, a small left lateral pneumothorax was sustained. This was treated with aspiration via the outer needle upon retraction resulting in complete aspiration and decompression of the pneumothorax. IMPRESSION: CT-guided core biopsy of cavitary left upper lobe lung nodule measuring approximately 9 x  12 mm by CT today. The procedure was complicated by a tiny lateral pneumothorax treated by needle aspiration which resulted in complete evacuation. A follow-up chest x-ray will be performed during recovery. Electronically Signed  By: Aletta Edouard M.D.   On: 06/29/2019 11:17   DG Chest Port 1 View  Result Date: 06/29/2019 CLINICAL DATA:  Status post percutaneous biopsy of left upper lobe lung nodule with aspiration small left pneumothorax at the time of the procedure. EXAM: PORTABLE CHEST 1 VIEW COMPARISON:  Imaging during CT-guided lung biopsy earlier today FINDINGS: The heart size and mediastinal contours are within normal limits. No pneumothorax identified. Small peripheral left upper lung opacity corresponds to the nodule sampled earlier today. No evidence of pleural fluid, consolidation or edema. The visualized skeletal structures are unremarkable. IMPRESSION: No pneumothorax after left upper lobe lung biopsy. Electronically Signed   By: Aletta Edouard M.D.   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 Goals of care, counseling/discussion   4. 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 I have discussed with patient, his wife, and also patient's son Jesse Fall was on the phone, again about rationale and potential side effects of chemotherapy treatments. Labs reviewed and discussed with patient.  Okay to proceed with cycle 1 cisplatin today.  He also start  radiation today. Goal of care, curative intent.  #Lung lesion biopsy showed squamous cell carcinoma. Primary squamous cell carcinoma of lung versus metastatic from his head and neck cancer. Have discussed with pathologist.  Not able to further distinguish due to possibilities. I will give the benefit of that and proceed with definitive treatment for his head and neck cancer. He also sees Dr. Baruch Gouty who recommends SBRT to the lung lesion.  # BMI 17, alcoholism Discussed with patient about alcohol cessation.  Continue Ensure supplements follow-up with dietitian. We discussed about feeding tube placement prior to the treatments Patient and family members declined and prefers to see how he does on chemotherapy/radiation treatments first.  Orders Placed This Encounter  Procedures  . Comprehensive metabolic panel    Standing Status:   Standing    Number of Occurrences:   20    Standing Expiration Date:   06/26/2020  . CBC with Differential/Platelet    Standing Status:   Standing    Number of Occurrences:   20    Standing Expiration Date:   06/26/2020    All questions were answered. The patient knows to call the clinic with any problems questions or concerns.   Return of visit:  1 week cc Casilda Carls, MD    Earlie Server, MD, PhD Hematology Oncology [facility] Pager- [phone] 07/19/2019

## 2019-07-19 NOTE — Progress Notes (Signed)
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  Patient starting concurrent chemotherapy and radiation therapy today.   Met with patient during infusion.  Patient reports that he is eating soft foods due to sore throat.  Reports that he is having trouble with his memory.  Has been eating 3 meals per day (eggs, oatmeal for breakfast. Lunch is peanut butter and jelly sandwich. Dinner is meat and vegetables that wife prepares.  Drinks 3-4 ensure original per day per patient. Likes vanilla shakes the best.    Patient and family have declined feeding tube placement per MD note.   Medications: reviewed  Labs: reviewed  Anthropometrics:   Weight 112 lb today 5/5 111 lb 111 lb 4/27   NUTRITION DIAGNOSIS: Inadequate oral intake stable    INTERVENTION:  Reviewed with patient again recommendation to choose higher calorie shakes.  Wrote option on piece of paper to take to wife.  Continue 3-4 per day (1050-1400 calories).   Reviewed foods that are soft, moist and high in protein for patient to choose.   Patient has contact information    MONITORING, EVALUATION, GOAL: weight trends, intake, treatment tolerance   NEXT VISIT: June 10th after radiation (Thursday)  Daniel Johndrow B. Zenia Resides, [place], [place] Registered Dietitian [phone] (pager)

## 2019-07-20 ENCOUNTER — Telehealth (INDEPENDENT_AMBULATORY_CARE_PROVIDER_SITE_OTHER): Payer: Self-pay

## 2019-07-20 ENCOUNTER — Inpatient Hospital Stay: Payer: Medicare Other | Attending: Oncology

## 2019-07-20 ENCOUNTER — Telehealth: Payer: Self-pay

## 2019-07-20 ENCOUNTER — Ambulatory Visit
Admission: RE | Admit: 2019-07-20 | Discharge: 2019-07-20 | Disposition: A | Discharge status: Home / Self Care | Payer: Medicare Other | Source: Ambulatory Visit | Attending: Radiation Oncology | Admitting: Radiation Oncology

## 2019-07-20 DIAGNOSIS — Z510 Encounter for antineoplastic radiation therapy: Secondary | ICD-10-CM | POA: Diagnosis not present

## 2019-07-20 NOTE — Telephone Encounter (Signed)
I attempted to contact the patient to schedule him for a port placement. A message was left for a return call.

## 2019-07-20 NOTE — Telephone Encounter (Signed)
Telephone call to patient for follow up after receiving first infusion.  Spoke to Patient wife and she states infusion went great.  States eating good and drinking plenty of fluids.   Denies any nausea or vomiting.  Encouraged patient's wife to call for any concerns or questions.

## 2019-07-21 ENCOUNTER — Inpatient Hospital Stay: Payer: Medicare Other

## 2019-07-21 ENCOUNTER — Ambulatory Visit
Admission: RE | Admit: 2019-07-21 | Discharge: 2019-07-21 | Disposition: A | Discharge status: Home / Self Care | Payer: Medicare Other | Source: Ambulatory Visit | Attending: Radiation Oncology | Admitting: Radiation Oncology

## 2019-07-21 DIAGNOSIS — Z510 Encounter for antineoplastic radiation therapy: Secondary | ICD-10-CM | POA: Diagnosis not present

## 2019-07-21 NOTE — Progress Notes (Signed)

## 2019-07-22 ENCOUNTER — Inpatient Hospital Stay: Payer: Medicare Other

## 2019-07-22 ENCOUNTER — Ambulatory Visit
Admission: RE | Admit: 2019-07-22 | Discharge: 2019-07-22 | Disposition: A | Discharge status: Home / Self Care | Payer: Medicare Other | Source: Ambulatory Visit | Attending: Radiation Oncology | Admitting: Radiation Oncology

## 2019-07-22 DIAGNOSIS — Z510 Encounter for antineoplastic radiation therapy: Secondary | ICD-10-CM | POA: Diagnosis not present

## 2019-07-23 ENCOUNTER — Encounter: Payer: Self-pay | Admitting: Oncology

## 2019-07-23 ENCOUNTER — Inpatient Hospital Stay: Payer: Medicare Other

## 2019-07-23 ENCOUNTER — Other Ambulatory Visit
Admission: RE | Admit: 2019-07-23 | Discharge: 2019-07-23 | Disposition: A | Discharge status: Home / Self Care | Payer: Medicare Other | Source: Ambulatory Visit | Attending: Vascular Surgery | Admitting: Vascular Surgery

## 2019-07-23 ENCOUNTER — Inpatient Hospital Stay (HOSPITAL_BASED_OUTPATIENT_CLINIC_OR_DEPARTMENT_OTHER): Payer: Medicare Other | Admitting: Oncology

## 2019-07-23 ENCOUNTER — Ambulatory Visit
Admission: RE | Admit: 2019-07-23 | Discharge: 2019-07-23 | Disposition: A | Discharge status: Home / Self Care | Payer: Medicare Other | Source: Ambulatory Visit | Attending: Radiation Oncology | Admitting: Radiation Oncology

## 2019-07-23 ENCOUNTER — Other Ambulatory Visit: Payer: Self-pay

## 2019-07-23 VITALS — BP 115/74 | HR 73 | Temp 96.6°F | Resp 16 | Wt 110.0 lb

## 2019-07-23 DIAGNOSIS — C109 Malignant neoplasm of oropharynx, unspecified: Secondary | ICD-10-CM

## 2019-07-23 DIAGNOSIS — Z01812 Encounter for preprocedural laboratory examination: Secondary | ICD-10-CM | POA: Insufficient documentation

## 2019-07-23 DIAGNOSIS — Z20822 Contact with and (suspected) exposure to covid-19: Secondary | ICD-10-CM | POA: Insufficient documentation

## 2019-07-23 DIAGNOSIS — R112 Nausea with vomiting, unspecified: Secondary | ICD-10-CM

## 2019-07-23 DIAGNOSIS — C3492 Malignant neoplasm of unspecified part of left bronchus or lung: Secondary | ICD-10-CM

## 2019-07-23 DIAGNOSIS — Z5111 Encounter for antineoplastic chemotherapy: Secondary | ICD-10-CM | POA: Diagnosis not present

## 2019-07-23 DIAGNOSIS — F1010 Alcohol abuse, uncomplicated: Secondary | ICD-10-CM | POA: Diagnosis not present

## 2019-07-23 DIAGNOSIS — Z7189 Other specified counseling: Secondary | ICD-10-CM

## 2019-07-23 DIAGNOSIS — Z510 Encounter for antineoplastic radiation therapy: Secondary | ICD-10-CM | POA: Diagnosis not present

## 2019-07-23 LAB — CBC WITH DIFFERENTIAL/PLATELET
Abs Immature Granulocytes: 0.01 10*3/uL (ref 0.00–0.07)
Basophils Absolute: 0 10*3/uL (ref 0.0–0.1)
Basophils Relative: 0 %
Eosinophils Absolute: 0.2 10*3/uL (ref 0.0–0.5)
Eosinophils Relative: 4 %
HCT: 37.4 % — ABNORMAL LOW (ref 39.0–52.0)
Hemoglobin: 12.2 g/dL — ABNORMAL LOW (ref 13.0–17.0)
Immature Granulocytes: 0 %
Lymphocytes Relative: 15 %
Lymphs Abs: 0.8 10*3/uL (ref 0.7–4.0)
MCH: 29.5 pg (ref 26.0–34.0)
MCHC: 32.6 g/dL (ref 30.0–36.0)
MCV: 90.3 fL (ref 80.0–100.0)
Monocytes Absolute: 0.7 10*3/uL (ref 0.1–1.0)
Monocytes Relative: 13 %
Neutro Abs: 3.8 10*3/uL (ref 1.7–7.7)
Neutrophils Relative %: 68 %
Platelets: 205 10*3/uL (ref 150–400)
RBC: 4.14 MIL/uL — ABNORMAL LOW (ref 4.22–5.81)
RDW: 12.1 % (ref 11.5–15.5)
WBC: 5.5 10*3/uL (ref 4.0–10.5)
nRBC: 0 % (ref 0.0–0.2)

## 2019-07-23 LAB — COMPREHENSIVE METABOLIC PANEL
ALT: 19 U/L (ref 0–44)
AST: 21 U/L (ref 15–41)
Albumin: 3.5 g/dL (ref 3.5–5.0)
Alkaline Phosphatase: 58 U/L (ref 38–126)
Anion gap: 8 (ref 5–15)
BUN: 17 mg/dL (ref 8–23)
CO2: 29 mmol/L (ref 22–32)
Calcium: 9.1 mg/dL (ref 8.9–10.3)
Chloride: 97 mmol/L — ABNORMAL LOW (ref 98–111)
Creatinine, Ser: 0.98 mg/dL (ref 0.61–1.24)
GFR calc Af Amer: >60 mL/min (ref >60)
GFR calc non Af Amer: >60 mL/min (ref >60)
Glucose, Bld: 98 mg/dL (ref 70–99)
Potassium: 4.5 mmol/L (ref 3.5–5.1)
Sodium: 134 mmol/L — ABNORMAL LOW (ref 135–145)
Total Bilirubin: 1.2 mg/dL (ref 0.3–1.2)
Total Protein: 7.4 g/dL (ref 6.5–8.1)

## 2019-07-23 LAB — SARS CORONAVIRUS 2 (TAT 6-24 HRS): SARS Coronavirus 2: NEGATIVE

## 2019-07-23 MED ORDER — NYSTATIN 100000 UNIT/ML MT SUSP
5.0000 mL | Freq: Four times a day (QID) | OROMUCOSAL | 0 refills | Status: DC
Start: 2019-07-23 — End: 2019-09-01

## 2019-07-23 MED ORDER — LORAZEPAM 2 MG/ML IJ SOLN
0.5000 mg | Freq: Once | INTRAMUSCULAR | Status: AC
  Administered 2019-07-23: 0.5 mg via INTRAVENOUS
  Filled 2019-07-23: qty 1

## 2019-07-23 MED ORDER — SODIUM CHLORIDE 0.9 % IV SOLN
Freq: Once | INTRAVENOUS | Status: AC
  Filled 2019-07-23: qty 250

## 2019-07-23 MED ORDER — ONDANSETRON HCL 4 MG/2ML IJ SOLN
8.0000 mg | Freq: Once | INTRAMUSCULAR | Status: AC
  Administered 2019-07-23: 8 mg via INTRAVENOUS
  Filled 2019-07-23: qty 4

## 2019-07-23 NOTE — Progress Notes (Addendum)
Hematology/Oncology progress note [facility] [phone] [phone]   Patient Care Team: Casilda Carls, MD as PCP - General (Internal Medicine) Earlie Server, [name], [name]  HISTORY OF PRESENTING ILLNESS:  Gregory Burton is a  79 y.o.  male with PMH listed below was seen in consultation at the request of  Casilda Carls, MD  for evaluation of lymph node.  Patient moved from [place] to [place] for about 3 weeks. He has noticed a neck knot on the right side of his neck. The knot is sore and makes him to cough and he feels it when he swallows. No swallowing difficulty.  Patient was accompanied by his wife. She reports that patient's previous PCP has done neck sonogram for evaluation.  Patient also had right lower molar tooth extraction done a few weeks before he noticed  He also took a course of antibiotics.  Due to his tooth pain, his appetite has decreased and he has lost weight loss, about 20 pounds, he can not specify the time frame.   Alcoholism History of prostate cancer. S/p Radiation/seed, he follows up with [facility].   INTERVAL HISTORY Shea Kapur is a 79 y.o. male who has above history reviewed by me today presents for acute visit. Patient's wife called on-call physician and reported that patient became weaker, had some nausea symptoms, wife is concerned about alcohol withdrawal. Patient started concurrent chemoradiation earlier this week. Patient has Compazine prescribed and has not picked up this prescription from his pharmacy yet.  Despite our conversation during the last visit regarding slowly weaning down his alcohol consumption to avoid alcohol withdrawal, patient reports that he has stopped drinking alcohol completely since 2-1/2 days ago.  Denies any trauma, seizure activities. No  vomiting episodes.  Review of Systems  Constitutional: Positive for fatigue. Negative for appetite change, chills, fever and unexpected weight change.  HENT:   Negative for hearing loss and voice change.   Eyes: Negative for eye problems and icterus.  Respiratory: Negative for chest tightness, cough and shortness of breath.   Cardiovascular: Negative for chest pain and leg swelling.  Gastrointestinal: Positive for nausea. Negative for abdominal distention and abdominal pain.  Endocrine: Negative for hot flashes.  Genitourinary: Negative for difficulty urinating, dysuria and frequency.   Musculoskeletal: Negative for arthralgias.  Skin: Negative for itching and rash.  Neurological: Negative for light-headedness and numbness.  Hematological: Negative for adenopathy. Does not bruise/bleed easily.  Psychiatric/Behavioral: Negative for confusion.    MEDICAL HISTORY:  Past Medical History:  Diagnosis Date  . Alcohol abuse   . Blood transfusion without reported diagnosis 2013  . Cataract   . Glaucoma   . Gout   . Hypercholesteremia   . Prostate CA ([place]) 2008  . 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 BRAIN SURGERY  2012  . HERNIA REPAIR      SOCIAL HISTORY: Social History   Socioeconomic History  . Marital status: Married    Spouse name: Not on file  . Number of children: Not on file  . Years of education: Not on file  . Highest education level: Not on file  Occupational History  . Not on file  Tobacco Use  . Smoking status: Former Smoker    Years: 21.00    Types: Cigarettes  Quit date: 05/11/2008    Years since quitting: 11.2  . Smokeless tobacco: Never Used  Substance and Sexual Activity  . Alcohol use: Yes    Comment: 0.5 pint liquor a week  . Drug use: Never  . Sexual activity: Not Currently  Other Topics Concern  . Not on file  Social History Narrative  . Not on file   Social  Determinants of Health   Financial Resource Strain:   . Difficulty of Paying Living Expenses:   Food Insecurity:   . Worried About Charity fundraiser in the Last Year:   . Arboriculturist in the Last Year:   Transportation Needs:   . Film/video editor (Medical):   Marland Kitchen Lack of Transportation (Non-Medical):   Physical Activity:   . Days of Exercise per Week:   . Minutes of Exercise per Session:   Stress:   . Feeling of Stress :   Social Connections:   . Frequency of Communication with Friends and Family:   . Frequency of Social Gatherings with Friends and Family:   . Attends Religious Services:   . Active Member of Clubs or Organizations:   . Attends Archivist Meetings:   Marland Kitchen Marital Status:   Intimate Partner Violence:   . Fear of Current or Ex-Partner:   . Emotionally Abused:   Marland Kitchen Physically Abused:   . Sexually Abused:     FAMILY HISTORY: History reviewed. No pertinent family history.  ALLERGIES:  is allergic to enalapril.  MEDICATIONS:  Current Outpatient Medications  Medication Sig Dispense Refill  . atorvastatin (LIPITOR) 20 MG tablet Take 20 mg by mouth daily.    Marland Kitchen donepezil (ARICEPT) 10 MG tablet Take 10 mg by mouth at bedtime.    . dorzolamide-timolol (COSOPT) 22.3-6.8 MG/ML ophthalmic solution Place 1 drop into both eyes 2 (two) times daily.     . folic acid (FOLVITE) 1 MG tablet Take 1 mg by mouth daily.    Marland Kitchen latanoprost (XALATAN) 0.005 % ophthalmic solution Place 1 drop into both eyes at bedtime.     . prednisoLONE 5 MG TABS tablet Take 5 mg by mouth daily as needed (Gout).    . predniSONE (DELTASONE) 5 MG tablet Take 5 mg by mouth daily with breakfast. As needed for gout flare    . Thiamine HCl (VITAMIN B-1 PO) Take 100 mg by mouth daily.     . prochlorperazine (COMPAZINE) 10 MG tablet Take 1 tablet (10 mg total) by mouth every 6 (six) hours as needed (Nausea or vomiting). (Patient not taking: Reported on 07/22/2019) 30 tablet 1   No current  facility-administered medications for this visit.     PHYSICAL EXAMINATION: ECOG PERFORMANCE STATUS: 1 - Symptomatic but completely ambulatory Vitals:   07/23/19 0946  BP: 115/74  Pulse: 73  Resp: 16  Temp: (!) 96.6 F (35.9 C)   Filed Weights   07/23/19 0946  Weight: 110 lb (49.9 kg)    Physical Exam Constitutional:      General: He is not in acute distress. HENT:     Head: Normocephalic and atraumatic.  Eyes:     General: No scleral icterus. Neck:     Comments: Palpable 1-2cm right neck mass  Cardiovascular:     Rate and Rhythm: Normal rate and regular rhythm.     Heart sounds: Normal heart sounds.  Pulmonary:     Effort: Pulmonary effort is normal. No respiratory distress.     Breath sounds: No wheezing.  Abdominal:     General: Bowel sounds are normal. There is no distension.     Palpations: Abdomen is soft.  Musculoskeletal:        General: No deformity. Normal range of motion.     Cervical back: Normal range of motion and neck supple.  Skin:    General: Skin is warm and dry.     Findings: No erythema or rash.  Neurological:     Mental Status: He is alert and oriented to person, place, and time. Mental status is at baseline.     Cranial Nerves: No cranial nerve deficit.     Coordination: Coordination normal.  Psychiatric:        Mood and Affect: Mood normal.     LABORATORY DATA:  I have reviewed the data as listed Lab Results  Component Value Date   WBC 5.5 07/23/2019   HGB 12.2 (L) 07/23/2019   HCT 37.4 (L) 07/23/2019   MCV 90.3 07/23/2019   PLT 205 07/23/2019   Recent Labs    05/12/19 1603 07/19/19 0835 07/23/19 0924  NA 139 140 134*  K 4.0 4.6 4.5  CL 103 104 97*  CO2 28 28 29   GLUCOSE 79 108* 98  BUN 14 11 17   CREATININE 0.63 0.79 0.98  CALCIUM 9.4 9.3 9.1  GFRNONAA >60 >60 >60  GFRAA >60 >60 >60  PROT 7.7 7.8 7.4  ALBUMIN 4.0 3.7 3.5  AST 20 20 21   ALT 16 17 19   ALKPHOS 57 62 58  BILITOT 1.2 1.2 1.2   Iron/TIBC/Ferritin/  %Sat No results found for: IRON, TIBC, FERRITIN, IRONPCTSAT    RADIOGRAPHIC STUDIES: I have personally reviewed the radiological images as listed and agreed with the findings in the report. 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 Staging imaging as also demonstrated a roughly 1 cm cavitary nodule in the left upper lobe. The patient presents for biopsy to determine whether this represents metastatic disease, primary lung carcinoma or benign nodule. EXAM: CT GUIDED CORE BIOPSY OF LEFT UPPER LOBE LUNG NODULE ANESTHESIA/SEDATION: 2.0 mg IV Versed; 100 mcg IV Fentanyl Total Moderate Sedation Time:  22 minutes. The patient's level of consciousness and physiologic status were continuously monitored during the procedure by Radiology nursing. PROCEDURE: The procedure risks, benefits, and alternatives were explained to the patient and his wife. Questions regarding the procedure were encouraged and answered. The patient and his wife understand and consent to the procedure. A time-out was performed prior to initiating the procedure. Supine imaging was performed through the upper to mid chest. The left upper chest wall was prepped with chlorhexidine in a sterile fashion, and a sterile drape was applied covering the operative field. A sterile gown and sterile gloves were used for the procedure. Local anesthesia was provided with 1% Lidocaine. Under CT guidance, a 17 gauge trocar needle was advanced to the level of a left upper lobe pulmonary nodule. After confirming needle tip position, 2 separate coaxial 18 gauge core biopsy samples were obtained. Additional imaging was performed. Aspiration was then performed via the outer needle at the level of the pleural space. Additional CT was performed after needle removal. COMPLICATIONS: Tiny left lateral pneumothorax. SIR level B: Nominal therapy (including overnight admission for observation), no  consequence. FINDINGS: Small lateral and posterior left upper lobe nodule again demonstrated which measures approximately 9 x 12 mm. This nodule demonstrates central cavitation. One small tissue fragment and a second more intact  core biopsy sample were able to be obtained. During the procedure, a small left lateral pneumothorax was sustained. This was treated with aspiration via the outer needle upon retraction resulting in complete aspiration and decompression of the pneumothorax. IMPRESSION: CT-guided core biopsy of cavitary left upper lobe lung nodule measuring approximately 9 x 12 mm by CT today. The procedure was complicated by a tiny lateral pneumothorax treated by needle aspiration which resulted in complete evacuation. A follow-up chest x-ray will be performed during recovery. Electronically Signed   By: Aletta Edouard M.D.   On: 06/29/2019 11:17   DG Chest Port 1 View  Result Date: 06/29/2019 CLINICAL DATA:  Status post percutaneous biopsy of left upper lobe lung nodule with aspiration small left pneumothorax at the time of the procedure. EXAM: PORTABLE CHEST 1 VIEW COMPARISON:  Imaging during CT-guided lung biopsy earlier today FINDINGS: The heart size and mediastinal contours are within normal limits. No pneumothorax identified. Small peripheral left upper lung opacity corresponds to the nodule sampled earlier today. No evidence of pleural fluid, consolidation or edema. The visualized skeletal structures are unremarkable. IMPRESSION: No pneumothorax after left upper lobe lung biopsy. Electronically Signed   By: Aletta Edouard M.D.   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 Alcohol abuse   4. 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  prophylactic nystatin oral rinse.  #Fatigue, likely secondary to chemo and radiation.  Labs are reviewed and discussed with patient and wife. #Nausea, I advised patient to pick up his prescription of Compazine and take as needed per instruction. I will give him IV fluid 1 L of normal saline today with Zofran 8 mg IV x1.  # Chronic alcohol use We discussed about symptoms of alcohol withdrawal.  Clinically he does not have severe symptoms. I will give him 25 mg of IV Ativan today along with his IV fluids and antiemetics for nausea as well as prevention of alcohol withdrawal. Advised patient to go to emergency room if he develops alcohol withdrawal symptoms over the weekend.    #Lung lesion biopsy showed squamous cell carcinoma.-He will proceed with SBRT in the future We discussed about feeding tube placement prior to the treatments Patient and family members declined and prefers to see how he does on chemotherapy/radiation treatments first.   All questions were answered. The patient knows to call the clinic with any problems questions or concerns.   Return of visit:  1 week cc Casilda Carls, MD    Earlie Server, MD, PhD Hematology Oncology [facility] Pager- [phone] 07/23/2019

## 2019-07-23 NOTE — Progress Notes (Signed)
1050: Clarified orders with MD. Per [name], pt to receive 1L NS over 1 hour and IV Zofran and IV Ativan. **see MAR**.

## 2019-07-23 NOTE — Progress Notes (Signed)
Patient's wife voices concerns with patient feeling nausea, weakness, and constipation.  His wife thinks his symtoms could be due to receiving his 2nd COVID vaccine on Wednesday in combination with XRT and possible withdrawals from alcohol.

## 2019-07-23 NOTE — Addendum Note (Signed)
Addended by: Earlie Server on: 07/23/2019 01:24 PM   Modules accepted: Orders

## 2019-07-26 ENCOUNTER — Other Ambulatory Visit (INDEPENDENT_AMBULATORY_CARE_PROVIDER_SITE_OTHER): Payer: Self-pay | Admitting: Nurse Practitioner

## 2019-07-27 ENCOUNTER — Ambulatory Visit: Payer: Medicare Other

## 2019-07-27 ENCOUNTER — Ambulatory Visit
Admission: RE | Admit: 2019-07-27 | Discharge: 2019-07-27 | Disposition: A | Discharge status: Home / Self Care | Payer: Medicare Other | Attending: Vascular Surgery | Admitting: Vascular Surgery

## 2019-07-27 ENCOUNTER — Other Ambulatory Visit: Payer: Self-pay

## 2019-07-27 ENCOUNTER — Encounter: Payer: Self-pay | Admitting: Vascular Surgery

## 2019-07-27 ENCOUNTER — Encounter
Admission: RE | Disposition: A | Discharge status: Home / Self Care | Payer: Self-pay | Source: Home / Self Care | Attending: Vascular Surgery

## 2019-07-27 ENCOUNTER — Inpatient Hospital Stay: Payer: Medicare Other | Attending: Oncology

## 2019-07-27 DIAGNOSIS — Z510 Encounter for antineoplastic radiation therapy: Secondary | ICD-10-CM | POA: Insufficient documentation

## 2019-07-27 DIAGNOSIS — Z923 Personal history of irradiation: Secondary | ICD-10-CM | POA: Diagnosis not present

## 2019-07-27 DIAGNOSIS — M109 Gout, unspecified: Secondary | ICD-10-CM | POA: Insufficient documentation

## 2019-07-27 DIAGNOSIS — E7800 Pure hypercholesterolemia, unspecified: Secondary | ICD-10-CM | POA: Diagnosis not present

## 2019-07-27 DIAGNOSIS — F1010 Alcohol abuse, uncomplicated: Secondary | ICD-10-CM | POA: Diagnosis not present

## 2019-07-27 DIAGNOSIS — R112 Nausea with vomiting, unspecified: Secondary | ICD-10-CM | POA: Insufficient documentation

## 2019-07-27 DIAGNOSIS — Z87891 Personal history of nicotine dependence: Secondary | ICD-10-CM | POA: Diagnosis not present

## 2019-07-27 DIAGNOSIS — C109 Malignant neoplasm of oropharynx, unspecified: Secondary | ICD-10-CM | POA: Diagnosis not present

## 2019-07-27 DIAGNOSIS — R2689 Other abnormalities of gait and mobility: Secondary | ICD-10-CM | POA: Insufficient documentation

## 2019-07-27 DIAGNOSIS — R5383 Other fatigue: Secondary | ICD-10-CM | POA: Diagnosis not present

## 2019-07-27 DIAGNOSIS — Z95828 Presence of other vascular implants and grafts: Secondary | ICD-10-CM

## 2019-07-27 DIAGNOSIS — C3492 Malignant neoplasm of unspecified part of left bronchus or lung: Secondary | ICD-10-CM | POA: Insufficient documentation

## 2019-07-27 DIAGNOSIS — C12 Malignant neoplasm of pyriform sinus: Secondary | ICD-10-CM | POA: Diagnosis present

## 2019-07-27 DIAGNOSIS — Z7289 Other problems related to lifestyle: Secondary | ICD-10-CM | POA: Insufficient documentation

## 2019-07-27 DIAGNOSIS — E876 Hypokalemia: Secondary | ICD-10-CM | POA: Diagnosis not present

## 2019-07-27 DIAGNOSIS — Z7952 Long term (current) use of systemic steroids: Secondary | ICD-10-CM | POA: Insufficient documentation

## 2019-07-27 DIAGNOSIS — Z5111 Encounter for antineoplastic chemotherapy: Secondary | ICD-10-CM | POA: Insufficient documentation

## 2019-07-27 DIAGNOSIS — Z8546 Personal history of malignant neoplasm of prostate: Secondary | ICD-10-CM | POA: Diagnosis not present

## 2019-07-27 DIAGNOSIS — C7802 Secondary malignant neoplasm of left lung: Secondary | ICD-10-CM | POA: Insufficient documentation

## 2019-07-27 DIAGNOSIS — Z9221 Personal history of antineoplastic chemotherapy: Secondary | ICD-10-CM | POA: Insufficient documentation

## 2019-07-27 DIAGNOSIS — Z79899 Other long term (current) drug therapy: Secondary | ICD-10-CM | POA: Insufficient documentation

## 2019-07-27 DIAGNOSIS — R531 Weakness: Secondary | ICD-10-CM | POA: Diagnosis not present

## 2019-07-27 DIAGNOSIS — C3490 Malignant neoplasm of unspecified part of unspecified bronchus or lung: Secondary | ICD-10-CM

## 2019-07-27 DIAGNOSIS — R634 Abnormal weight loss: Secondary | ICD-10-CM | POA: Insufficient documentation

## 2019-07-27 HISTORY — PX: PORTA CATH INSERTION: CATH118285

## 2019-07-27 HISTORY — DX: Unspecified dementia, unspecified severity, without behavioral disturbance, psychotic disturbance, mood disturbance, and anxiety: F03.90

## 2019-07-27 HISTORY — DX: Essential (primary) hypertension: I10

## 2019-07-27 SURGERY — PORTA CATH INSERTION
Anesthesia: Moderate Sedation

## 2019-07-27 MED ORDER — CEFAZOLIN SODIUM-DEXTROSE 2-4 GM/100ML-% IV SOLN: 2.0000 g | Freq: Once | INTRAVENOUS | Status: AC

## 2019-07-27 MED ORDER — METHYLPREDNISOLONE SODIUM SUCC 125 MG IJ SOLR: 125.0000 mg | Freq: Once | INTRAMUSCULAR | Status: DC | PRN

## 2019-07-27 MED ORDER — MIDAZOLAM HCL 2 MG/ML PO SYRP: 8.0000 mg | ORAL_SOLUTION | Freq: Once | ORAL | Status: DC | PRN

## 2019-07-27 MED ORDER — FAMOTIDINE 20 MG PO TABS: 40.0000 mg | ORAL_TABLET | Freq: Once | ORAL | Status: DC | PRN

## 2019-07-27 MED ORDER — FENTANYL CITRATE (PF) 100 MCG/2ML IJ SOLN
INTRAMUSCULAR | Status: AC
  Filled 2019-07-27: qty 2

## 2019-07-27 MED ORDER — HYDROMORPHONE HCL 1 MG/ML IJ SOLN: 1.0000 mg | Freq: Once | INTRAMUSCULAR | Status: DC | PRN

## 2019-07-27 MED ORDER — SODIUM CHLORIDE 0.9 % IV SOLN: INTRAVENOUS | Status: DC

## 2019-07-27 MED ORDER — MIDAZOLAM HCL 2 MG/2ML IJ SOLN
INTRAMUSCULAR | Status: DC | PRN
  Administered 2019-07-27: 2 mg via INTRAVENOUS
  Administered 2019-07-27: 0.5 mg via INTRAVENOUS

## 2019-07-27 MED ORDER — ONDANSETRON HCL 4 MG/2ML IJ SOLN: 4.0000 mg | Freq: Four times a day (QID) | INTRAMUSCULAR | Status: DC | PRN

## 2019-07-27 MED ORDER — CEFAZOLIN SODIUM-DEXTROSE 2-4 GM/100ML-% IV SOLN
INTRAVENOUS | Status: AC
  Administered 2019-07-27: 2 g via INTRAVENOUS
  Filled 2019-07-27: qty 100

## 2019-07-27 MED ORDER — LIDOCAINE-PRILOCAINE 2.5-2.5 % EX CREA: 1.0000 "application " | TOPICAL_CREAM | CUTANEOUS | 1 refills | Status: DC | PRN

## 2019-07-27 MED ORDER — FENTANYL CITRATE (PF) 100 MCG/2ML IJ SOLN
INTRAMUSCULAR | Status: DC | PRN
  Administered 2019-07-27: 25 ug via INTRAVENOUS
  Administered 2019-07-27: 50 ug via INTRAVENOUS

## 2019-07-27 MED ORDER — DIPHENHYDRAMINE HCL 50 MG/ML IJ SOLN: 50.0000 mg | Freq: Once | INTRAMUSCULAR | Status: DC | PRN

## 2019-07-27 MED ORDER — MIDAZOLAM HCL 5 MG/5ML IJ SOLN
INTRAMUSCULAR | Status: AC
  Filled 2019-07-27: qty 5

## 2019-07-27 SURGICAL SUPPLY — 13 items
DERMABOND ADVANCED (GAUZE/BANDAGES/DRESSINGS) ×1
DERMABOND ADVANCED .7 DNX12 (GAUZE/BANDAGES/DRESSINGS) IMPLANT
DRAPE INCISE IOBAN 66X45 STRL (DRAPES) ×2 IMPLANT
KIT PORT POWER 8FR ISP CVUE (Port) ×1 IMPLANT
NDL ENTRY 21GA 7CM ECHOTIP (NEEDLE) IMPLANT
NEEDLE ENTRY 21GA 7CM ECHOTIP (NEEDLE) ×2 IMPLANT
PACK ANGIOGRAPHY (CUSTOM PROCEDURE TRAY) ×1 IMPLANT
SET INTRO CAPELLA COAXIAL (SET/KITS/TRAYS/PACK) ×1 IMPLANT
SUT MNCRL 4-0 (SUTURE) ×1
SUT MNCRL 4-0 27XMFL (SUTURE) ×1
SUT VIC AB 3-0 CT1 27 (SUTURE) ×1
SUT VIC AB 3-0 CT1 TAPERPNT 27 (SUTURE) IMPLANT
SUTURE MNCRL 4-0 27XMF (SUTURE) IMPLANT

## 2019-07-27 NOTE — Op Note (Signed)
OPERATIVE NOTE   PROCEDURE: 1. Placement of a right IJ Infuse-a-Port  PRE-OPERATIVE DIAGNOSIS: Squamous cell carcinoma metastatic  POST-OPERATIVE DIAGNOSIS: Same  SURGEON: Katha Cabal M.D.  ANESTHESIA: Conscious sedation was administered under my direct supervision by the interventional radiology RN. IV Versed plus fentanyl were utilized. Continuous ECG, pulse oximetry and blood pressure was monitored throughout the entire procedure. Conscious sedation was for a total of 30 minutes.  ESTIMATED BLOOD LOSS: Minimal   FINDING(S): 1.  Patent vein  SPECIMEN(S): None  INDICATIONS:   Gregory Burton is a 79 y.o. male who presents with squamous cell carcinoma he will require chemotherapy and therefore appropriate parenteral access.  Risks and benefits for port placement of been reviewed all questions answered patient agrees to proceed.  DESCRIPTION: After obtaining full informed written consent, the patient was brought back to the special procedure suite and placed in the supine position. The patient's right neck and chest wall are prepped and draped in sterile fashion. Appropriate timeout was called.  Ultrasound is placed in a sterile sleeve, ultrasound is utilized to avoid vascular injury as well as secondary to lack of appropriate landmarks. The right IJ vein is identified. It is echolucent and homogeneous as well as easily compressible indicating patency. An image is recorded for the permanent record.  Access to the vein with a micropuncture needle is done under direct ultrasound visualization.  1% lidocaine is infiltrated into the soft tissue at the base of the neck as well as on the chest wall.  Under direct ultrasound visualization a micro-needle is inserted into the vein followed by the micro-wire. Micro-sheath was then advanced and a J wire is inserted without difficulty under fluoroscopic guidance. A small counterincision was created at the wire insertion site. A transverse  incision is created 2 fingerbreadths below the scapula and a pocket is fashioned using both blunt and sharp dissection. The pocket is tested for appropriate size with the hub of the Infuse-a-Port. The tunneling device is then used to pull the intravascular portion of the catheter from the pocket to the neck counterincision.  Dilator and peel-away sheath were then inserted over the wire and the wire is removed. Catheter is then advanced into the venous system without difficulty. Peel-away sheath was then removed.  Catheter is then positioned under fluoroscopic guidance at the atrial caval junction. It is then transected connected to the hub and the hope is slipped into the subcutaneous pocket on the chest wall. The hub was then accessed percutaneously and aspirates easily and flushes well and is flushed with 30 cc of heparinized saline. The pocket incision is then closed in layers using interrupted 3-0 Vicryl for the subcutaneous tissues and 4-0 Monocryl subcuticular for skin closure. Dermabond is applied. The neck counterincision was closed with 4-0 Monocryl subcuticular and Dermabond as well.  The patient tolerated the procedure well and there were no immediate complications.  COMPLICATIONS: None  CONDITION: Unchanged  Katha Cabal M.D.  vein and vascular Office: [phone]   07/27/2019, 11:04 AM

## 2019-07-27 NOTE — H&P (Signed)
[facility] History & Physical Update  The patient was interviewed and re-examined.  The patient's previous History and Physical has been reviewed and is unchanged.  There is no change in the plan of care. We plan to proceed with the scheduled procedure.  Hortencia Pilar, MD  07/27/2019, 10:11 AM

## 2019-07-27 NOTE — Progress Notes (Signed)
error 

## 2019-07-27 NOTE — Discharge Instructions (Signed)
Moderate Conscious Sedation, Adult, Care After These instructions provide you with information about caring for yourself after your procedure. Your health care provider may also give you more specific instructions. Your treatment has been planned according to current medical practices, but problems sometimes occur. Call your health care provider if you have any problems or questions after your procedure. What can I expect after the procedure? After your procedure, it is common:  To feel sleepy for several hours.  To feel clumsy and have poor balance for several hours.  To have poor judgment for several hours.  To vomit if you eat too soon. Follow these instructions at home: For at least 24 hours after the procedure:   Do not: ? Participate in activities where you could fall or become injured. ? Drive. ? Use heavy machinery. ? Drink alcohol. ? Take sleeping pills or medicines that cause drowsiness. ? Make important decisions or sign legal documents. ? Take care of children on your own.  Rest. Eating and drinking  Follow the diet recommended by your health care provider.  If you vomit: ? Drink water, juice, or soup when you can drink without vomiting. ? Make sure you have little or no nausea before eating solid foods. General instructions  Have a responsible adult stay with you until you are awake and alert.  Take over-the-counter and prescription medicines only as told by your health care provider.  If you smoke, do not smoke without supervision.  Keep all follow-up visits as told by your health care provider. This is important. Contact a health care provider if:  You keep feeling nauseous or you keep vomiting.  You feel light-headed.  You develop a rash.  You have a fever. Get help right away if:  You have trouble breathing. This information is not intended to replace advice given to you by your health care provider. Make sure you discuss any questions you have  with your health care provider. Document Revised: 01/24/2017 Document Reviewed: 06/03/2015 Elsevier Patient Education  [place] Insertion, Care After This sheet gives you information about how to care for yourself after your procedure. Your health care provider may also give you more specific instructions. If you have problems or questions, contact your health care provider. What can I expect after the procedure? After the procedure, it is common to have:  Discomfort at the port insertion site.  Bruising on the skin over the port. This should improve over 3-4 days. Follow these instructions at home: [facility] care  After your port is placed, you will get a manufacturer's information card. The card has information about your port. Keep this card with you at all times.  Take care of the port as told by your health care provider. Ask your health care provider if you or a family member can get training for taking care of the port at home. A home health care nurse may also take care of the port.  Make sure to remember what type of port you have. Incision care      Follow instructions from your health care provider about how to take care of your port insertion site. Make sure you: ? Wash your hands with soap and water before and after you change your bandage (dressing). If soap and water are not available, use hand sanitizer. ? Change your dressing as told by your health care provider. ? Leave stitches (sutures), skin glue, or adhesive strips in place. These skin closures may need to stay  in place for 2 weeks or longer. If adhesive strip edges start to loosen and curl up, you may trim the loose edges. Do not remove adhesive strips completely unless your health care provider tells you to do that.  Check your port insertion site every day for signs of infection. Check for: ? Redness, swelling, or pain. ? Fluid or blood. ? Warmth. ? Pus or a bad smell. Activity  Return  to your normal activities as told by your health care provider. Ask your health care provider what activities are safe for you.  Do not lift anything that is heavier than 10 lb (4.5 kg), or the limit that you are told, until your health care provider says that it is safe. General instructions  Take over-the-counter and prescription medicines only as told by your health care provider.  Do not take baths, swim, or use a hot tub until your health care provider approves. Ask your health care provider if you may take showers. You may only be allowed to take sponge baths.  Do not drive for 24 hours if you were given a sedative during your procedure.  Wear a medical alert bracelet in case of an emergency. This will tell any health care providers that you have a port.  Keep all follow-up visits as told by your health care provider. This is important. Contact a health care provider if:  You cannot flush your port with saline as directed, or you cannot draw blood from the port.  You have a fever or chills.  You have redness, swelling, or pain around your port insertion site.  You have fluid or blood coming from your port insertion site.  Your port insertion site feels warm to the touch.  You have pus or a bad smell coming from the port insertion site. Get help right away if:  You have chest pain or shortness of breath.  You have bleeding from your port that you cannot control. Summary  Take care of the port as told by your health care provider. Keep the manufacturer's information card with you at all times.  Change your dressing as told by your health care provider.  Contact a health care provider if you have a fever or chills or if you have redness, swelling, or pain around your port insertion site.  Keep all follow-up visits as told by your health care provider. This information is not intended to replace advice given to you by your health care provider. Make sure you discuss any  questions you have with your health care provider. Document Revised: 09/09/2017 Document Reviewed: 09/09/2017 Elsevier Patient Education  [place].

## 2019-07-28 ENCOUNTER — Encounter: Payer: Self-pay | Admitting: Oncology

## 2019-07-28 ENCOUNTER — Ambulatory Visit
Admission: RE | Admit: 2019-07-28 | Discharge: 2019-07-28 | Disposition: A | Discharge status: Home / Self Care | Payer: Medicare Other | Source: Ambulatory Visit | Attending: Radiation Oncology | Admitting: Radiation Oncology

## 2019-07-28 ENCOUNTER — Inpatient Hospital Stay (HOSPITAL_BASED_OUTPATIENT_CLINIC_OR_DEPARTMENT_OTHER): Payer: Medicare Other | Admitting: Oncology

## 2019-07-28 ENCOUNTER — Inpatient Hospital Stay: Payer: Medicare Other

## 2019-07-28 VITALS — BP 133/81 | HR 60 | Temp 96.5°F | Resp 18 | Wt 110.5 lb

## 2019-07-28 DIAGNOSIS — C3492 Malignant neoplasm of unspecified part of left bronchus or lung: Secondary | ICD-10-CM

## 2019-07-28 DIAGNOSIS — F1010 Alcohol abuse, uncomplicated: Secondary | ICD-10-CM

## 2019-07-28 DIAGNOSIS — C109 Malignant neoplasm of oropharynx, unspecified: Secondary | ICD-10-CM | POA: Diagnosis not present

## 2019-07-28 DIAGNOSIS — Z7189 Other specified counseling: Secondary | ICD-10-CM

## 2019-07-28 DIAGNOSIS — Z510 Encounter for antineoplastic radiation therapy: Secondary | ICD-10-CM | POA: Diagnosis not present

## 2019-07-28 DIAGNOSIS — Z5111 Encounter for antineoplastic chemotherapy: Secondary | ICD-10-CM | POA: Diagnosis not present

## 2019-07-28 LAB — COMPREHENSIVE METABOLIC PANEL
ALT: 17 U/L (ref 0–44)
AST: 18 U/L (ref 15–41)
Albumin: 3.6 g/dL (ref 3.5–5.0)
Alkaline Phosphatase: 58 U/L (ref 38–126)
Anion gap: 8 (ref 5–15)
BUN: 15 mg/dL (ref 8–23)
CO2: 29 mmol/L (ref 22–32)
Calcium: 9.2 mg/dL (ref 8.9–10.3)
Chloride: 100 mmol/L (ref 98–111)
Creatinine, Ser: 0.76 mg/dL (ref 0.61–1.24)
GFR calc Af Amer: >60 mL/min (ref >60)
GFR calc non Af Amer: >60 mL/min (ref >60)
Glucose, Bld: 103 mg/dL — ABNORMAL HIGH (ref 70–99)
Potassium: 3.9 mmol/L (ref 3.5–5.1)
Sodium: 137 mmol/L (ref 135–145)
Total Bilirubin: 0.8 mg/dL (ref 0.3–1.2)
Total Protein: 7.5 g/dL (ref 6.5–8.1)

## 2019-07-28 LAB — CBC WITH DIFFERENTIAL/PLATELET
Abs Immature Granulocytes: 0.02 10*3/uL (ref 0.00–0.07)
Basophils Absolute: 0 10*3/uL (ref 0.0–0.1)
Basophils Relative: 0 %
Eosinophils Absolute: 0.1 10*3/uL (ref 0.0–0.5)
Eosinophils Relative: 1 %
HCT: 33.9 % — ABNORMAL LOW (ref 39.0–52.0)
Hemoglobin: 11.2 g/dL — ABNORMAL LOW (ref 13.0–17.0)
Immature Granulocytes: 0 %
Lymphocytes Relative: 14 %
Lymphs Abs: 0.9 10*3/uL (ref 0.7–4.0)
MCH: 29.6 pg (ref 26.0–34.0)
MCHC: 33 g/dL (ref 30.0–36.0)
MCV: 89.4 fL (ref 80.0–100.0)
Monocytes Absolute: 0.7 10*3/uL (ref 0.1–1.0)
Monocytes Relative: 11 %
Neutro Abs: 4.6 10*3/uL (ref 1.7–7.7)
Neutrophils Relative %: 74 %
Platelets: 242 10*3/uL (ref 150–400)
RBC: 3.79 MIL/uL — ABNORMAL LOW (ref 4.22–5.81)
RDW: 11.9 % (ref 11.5–15.5)
WBC: 6.3 10*3/uL (ref 4.0–10.5)
nRBC: 0 % (ref 0.0–0.2)

## 2019-07-28 MED ORDER — POTASSIUM CHLORIDE 2 MEQ/ML IV SOLN
Freq: Once | INTRAVENOUS | Status: AC
  Filled 2019-07-28: qty 1000

## 2019-07-28 MED ORDER — SODIUM CHLORIDE 0.9% FLUSH
10.0000 mL | INTRAVENOUS | Status: DC | PRN
  Administered 2019-07-28: 10 mL via INTRAVENOUS
  Filled 2019-07-28: qty 10

## 2019-07-28 MED ORDER — SODIUM CHLORIDE 0.9 % IV SOLN
Freq: Once | INTRAVENOUS | Status: AC
  Filled 2019-07-28: qty 250

## 2019-07-28 MED ORDER — SODIUM CHLORIDE 0.9 % IV SOLN
10.0000 mg | Freq: Once | INTRAVENOUS | Status: AC
  Administered 2019-07-28: 10 mg via INTRAVENOUS
  Filled 2019-07-28: qty 10

## 2019-07-28 MED ORDER — HEPARIN SOD (PORK) LOCK FLUSH 100 UNIT/ML IV SOLN
INTRAVENOUS | Status: AC
  Filled 2019-07-28: qty 5

## 2019-07-28 MED ORDER — MEGESTROL ACETATE 40 MG PO TABS
40.0000 mg | ORAL_TABLET | Freq: Two times a day (BID) | ORAL | 0 refills | Status: DC
Start: 2019-07-28 — End: 2019-08-19

## 2019-07-28 MED ORDER — DEXAMETHASONE 4 MG PO TABS
8.0000 mg | ORAL_TABLET | ORAL | 0 refills | Status: DC
Start: 2019-07-28 — End: 2019-08-04

## 2019-07-28 MED ORDER — SODIUM CHLORIDE 0.9 % IV SOLN
150.0000 mg | Freq: Once | INTRAVENOUS | Status: AC
  Administered 2019-07-28: 150 mg via INTRAVENOUS
  Filled 2019-07-28: qty 150

## 2019-07-28 MED ORDER — HEPARIN SOD (PORK) LOCK FLUSH 100 UNIT/ML IV SOLN
500.0000 [IU] | Freq: Once | INTRAVENOUS | Status: AC
  Administered 2019-07-28: 500 [IU] via INTRAVENOUS
  Filled 2019-07-28: qty 5

## 2019-07-28 MED ORDER — SODIUM CHLORIDE 0.9 % IV SOLN: 75.0000 mg | Freq: Once | INTRAVENOUS | Status: DC

## 2019-07-28 MED ORDER — HEPARIN SOD (PORK) LOCK FLUSH 100 UNIT/ML IV SOLN
500.0000 [IU] | Freq: Once | INTRAVENOUS | Status: DC | PRN
  Filled 2019-07-28: qty 5

## 2019-07-28 MED ORDER — PALONOSETRON HCL INJECTION 0.25 MG/5ML
0.2500 mg | Freq: Once | INTRAVENOUS | Status: AC
  Administered 2019-07-28: 0.25 mg via INTRAVENOUS
  Filled 2019-07-28: qty 5

## 2019-07-28 MED ORDER — SODIUM CHLORIDE 0.9 % IV SOLN
40.0000 mg/m2 | Freq: Once | INTRAVENOUS | Status: AC
  Administered 2019-07-28: 62 mg via INTRAVENOUS
  Filled 2019-07-28: qty 62

## 2019-07-28 NOTE — Progress Notes (Signed)
First dose cisplatin was 75mg . Calculated dose of 40mg /m2 is 62mg . Per MD will go with calculated dose of 62mg  moving forward.

## 2019-07-28 NOTE — Progress Notes (Signed)
Patient reports still feeling weak and appetite has not improved with a stable weight.

## 2019-07-28 NOTE — Progress Notes (Signed)
Hematology/Oncology progress note [facility] [phone] [phone]   Patient Care Team: Casilda Carls, MD as PCP - General (Internal Medicine) Earlie Server, [name], [name]  HISTORY OF PRESENTING ILLNESS:  Gregory Burton is a  79 y.o.  male with PMH listed below was seen in consultation at the request of  Casilda Carls, MD  for evaluation of lymph node.  Patient moved from [place] to [place] for about 3 weeks. He has noticed a neck knot on the right side of his neck. The knot is sore and makes him to cough and he feels it when he swallows. No swallowing difficulty.  Patient was accompanied by his wife. She reports that patient's previous PCP has done neck sonogram for evaluation.  Patient also had right lower molar tooth extraction done a few weeks before he noticed  He also took a course of antibiotics.  Due to his tooth pain, his appetite has decreased and he has lost weight loss, about 20 pounds, he can not specify the time frame.   Alcoholism History of prostate cancer. S/p Radiation/seed, he follows up with [facility].   INTERVAL HISTORY [name] is a 79 y.o. male who has above history reviewed by me today presents for evaluation prior to chemotherapy. Patient has no new complaints today. Denies any swallowing difficulty. No seizure activity.  He drank 1 shot of hard liquor after his last visit.  Last drink 3 days ago. He is here by himself today.  He is a poor historian.  No appetite.  No nausea vomiting.  Review of Systems  Constitutional: Positive for fatigue. Negative for appetite change, chills, fever and unexpected weight change.  HENT:   Negative for hearing loss and voice change.   Eyes: Negative for eye problems and icterus.  Respiratory: Negative for chest tightness, cough and  shortness of breath.   Cardiovascular: Negative for chest pain and leg swelling.  Gastrointestinal: Negative for abdominal distention, abdominal pain and nausea.  Endocrine: Negative for hot flashes.  Genitourinary: Negative for difficulty urinating, dysuria and frequency.   Musculoskeletal: Negative for arthralgias.  Skin: Negative for itching and rash.  Neurological: Negative for light-headedness and numbness.  Hematological: Negative for adenopathy. Does not bruise/bleed easily.  Psychiatric/Behavioral: Negative for confusion.    MEDICAL HISTORY:  Past Medical History:  Diagnosis Date  . Alcohol abuse   . Blood transfusion without reported diagnosis 2013  . Cataract   . Dementia ([place])   . Glaucoma   . Gout   . Hypercholesteremia   . Hypertension   . Prostate CA ([place]) 2008  . 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 BRAIN SURGERY  2012  . HERNIA REPAIR    . PORTA CATH INSERTION N/A 07/27/2019   Procedure: PORTA CATH INSERTION;  Surgeon: Katha Cabal, MD;  Location: [facility];  Service: Cardiovascular;  Laterality: N/A;    SOCIAL HISTORY: Social History   Socioeconomic History  . Marital status: Married    Spouse name: Not on file  . Number of children: Not on file  . Years of education: Not on file  . Highest education level: Not on file  Occupational History  . Not on file  Tobacco Use  . Smoking status: Former Smoker    Years: 21.00  Types: Cigarettes    Quit date: 05/11/2008    Years since quitting: 11.2  . Smokeless tobacco: Never Used  Substance and Sexual Activity  . Alcohol use: Yes    Comment: 0.5 pint liquor a week  . Drug use: Never  . Sexual activity: Not Currently  Other Topics Concern  . Not on file  Social History Narrative   Lives at home with wife. Wife states he has some dementia but is able to sign his own consent.   Social Determinants of  Health   Financial Resource Strain:   . Difficulty of Paying Living Expenses:   Food Insecurity:   . Worried About Charity fundraiser in the Last Year:   . Arboriculturist in the Last Year:   Transportation Needs:   . Film/video editor (Medical):   Marland Kitchen Lack of Transportation (Non-Medical):   Physical Activity:   . Days of Exercise per Week:   . Minutes of Exercise per Session:   Stress:   . Feeling of Stress :   Social Connections:   . Frequency of Communication with Friends and Family:   . Frequency of Social Gatherings with Friends and Family:   . Attends Religious Services:   . Active Member of Clubs or Organizations:   . Attends Archivist Meetings:   Marland Kitchen Marital Status:   Intimate Partner Violence:   . Fear of Current or Ex-Partner:   . Emotionally Abused:   Marland Kitchen Physically Abused:   . Sexually Abused:     FAMILY HISTORY: History reviewed. No pertinent family history.  ALLERGIES:  is allergic to enalapril.  MEDICATIONS:  Current Outpatient Medications  Medication Sig Dispense Refill  . atorvastatin (LIPITOR) 20 MG tablet Take 20 mg by mouth daily.    Marland Kitchen donepezil (ARICEPT) 10 MG tablet Take 10 mg by mouth at bedtime.    . dorzolamide-timolol (COSOPT) 22.3-6.8 MG/ML ophthalmic solution Place 1 drop into both eyes 2 (two) times daily.     . folic acid (FOLVITE) 1 MG tablet Take 1 mg by mouth daily.    Marland Kitchen latanoprost (XALATAN) 0.005 % ophthalmic solution Place 1 drop into both eyes at bedtime.     . lidocaine-prilocaine (EMLA) cream Apply 1 application topically as needed. Apply small amount to port site approx 1-2 hours prior to appointment. 30 g 1  . nystatin (MYCOSTATIN) 100000 UNIT/ML suspension Take 5 mLs (500,000 Units total) by mouth 4 (four) times daily. 473 mL 0  . prednisoLONE 5 MG TABS tablet Take 5 mg by mouth daily as needed (Gout).    . predniSONE (DELTASONE) 5 MG tablet Take 5 mg by mouth daily with breakfast. As needed for gout flare    .  Thiamine HCl (VITAMIN B-1 PO) Take 100 mg by mouth daily.     Marland Kitchen dexamethasone (DECADRON) 4 MG tablet Take 2 tablets (8 mg total) by mouth See admin instructions. Take 2 tablets daily for 2 days after each chemotherapy 8 tablet 0  . megestrol (MEGACE) 40 MG tablet Take 1 tablet (40 mg total) by mouth 2 (two) times daily. 60 tablet 0  . prochlorperazine (COMPAZINE) 10 MG tablet Take 1 tablet (10 mg total) by mouth every 6 (six) hours as needed (Nausea or vomiting). (Patient not taking: Reported on 07/22/2019) 30 tablet 1   No current facility-administered medications for this visit.   Facility-Administered Medications Ordered in Other Visits  Medication Dose Route Frequency Provider Last Rate Last Admin  . CISplatin (  PLATINOL) 62 mg in sodium chloride 0.9 % 250 mL chemo infusion  40 mg/m2 (Treatment Plan Recorded) Intravenous Once Earlie Server, MD 312 mL/hr at 07/28/19 1305 62 mg at 07/28/19 1305  . heparin lock flush 100 unit/mL  500 Units Intravenous Once Earlie Server, MD      . heparin lock flush 100 unit/mL  500 Units Intracatheter Once PRN Earlie Server, MD      . sodium chloride flush (NS) 0.9 % injection 10 mL  10 mL Intravenous PRN Earlie Server, MD   10 mL at 07/28/19 0838     PHYSICAL EXAMINATION: ECOG PERFORMANCE STATUS: 1 - Symptomatic but completely ambulatory Vitals:   07/28/19 0849  BP: 133/81  Pulse: 60  Resp: 18  Temp: (!) 96.5 F (35.8 C)   Filed Weights   07/28/19 0849  Weight: 110 lb 8 oz (50.1 kg)    Physical Exam Constitutional:      General: He is not in acute distress. HENT:     Head: Normocephalic and atraumatic.  Eyes:     General: No scleral icterus. Neck:     Comments: Palpable 1-2cm right neck mass  Cardiovascular:     Rate and Rhythm: Normal rate and regular rhythm.     Heart sounds: Normal heart sounds.  Pulmonary:     Effort: Pulmonary effort is normal. No respiratory distress.     Breath sounds: No wheezing.  Abdominal:     General: Bowel sounds are normal.  There is no distension.     Palpations: Abdomen is soft.  Musculoskeletal:        General: No deformity. Normal range of motion.     Cervical back: Normal range of motion and neck supple.  Skin:    General: Skin is warm and dry.     Findings: No erythema or rash.  Neurological:     Mental Status: He is alert and oriented to person, place, and time. Mental status is at baseline.     Cranial Nerves: No cranial nerve deficit.     Coordination: Coordination normal.  Psychiatric:        Mood and Affect: Mood normal.     LABORATORY DATA:  I have reviewed the data as listed Lab Results  Component Value Date   WBC 6.3 07/28/2019   HGB 11.2 (L) 07/28/2019   HCT 33.9 (L) 07/28/2019   MCV 89.4 07/28/2019   PLT 242 07/28/2019   Recent Labs    07/19/19 0835 07/23/19 0924 07/28/19 0830  NA 140 134* 137  K 4.6 4.5 3.9  CL 104 97* 100  CO2 28 29 29   GLUCOSE 108* 98 103*  BUN 11 17 15   CREATININE 0.79 0.98 0.76  CALCIUM 9.3 9.1 9.2  GFRNONAA >60 >60 >60  GFRAA >60 >60 >60  PROT 7.8 7.4 7.5  ALBUMIN 3.7 3.5 3.6  AST 20 21 18   ALT 17 19 17   ALKPHOS 62 58 58  BILITOT 1.2 1.2 0.8   Iron/TIBC/Ferritin/ %Sat No results found for: IRON, TIBC, FERRITIN, IRONPCTSAT    RADIOGRAPHIC STUDIES: I have personally reviewed the radiological images as listed and agreed with the findings in the report. 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 Staging imaging as also demonstrated a roughly 1 cm cavitary nodule in the left upper lobe. The patient presents for biopsy to determine whether this represents metastatic disease,  primary lung carcinoma or benign nodule. EXAM: CT GUIDED CORE BIOPSY OF LEFT UPPER LOBE LUNG NODULE ANESTHESIA/SEDATION: 2.0 mg IV Versed; 100 mcg IV Fentanyl Total Moderate Sedation Time:  22 minutes. The patient's level of  consciousness and physiologic status were continuously monitored during the procedure by Radiology nursing. PROCEDURE: The procedure risks, benefits, and alternatives were explained to the patient and his wife. Questions regarding the procedure were encouraged and answered. The patient and his wife understand and consent to the procedure. A time-out was performed prior to initiating the procedure. Supine imaging was performed through the upper to mid chest. The left upper chest wall was prepped with chlorhexidine in a sterile fashion, and a sterile drape was applied covering the operative field. A sterile gown and sterile gloves were used for the procedure. Local anesthesia was provided with 1% Lidocaine. Under CT guidance, a 17 gauge trocar needle was advanced to the level of a left upper lobe pulmonary nodule. After confirming needle tip position, 2 separate coaxial 18 gauge core biopsy samples were obtained. Additional imaging was performed. Aspiration was then performed via the outer needle at the level of the pleural space. Additional CT was performed after needle removal. COMPLICATIONS: Tiny left lateral pneumothorax. SIR level B: Nominal therapy (including overnight admission for observation), no consequence. FINDINGS: Small lateral and posterior left upper lobe nodule again demonstrated which measures approximately 9 x 12 mm. This nodule demonstrates central cavitation. One small tissue fragment and a second more intact core biopsy sample were able to be obtained. During the procedure, a small left lateral pneumothorax was sustained. This was treated with aspiration via the outer needle upon retraction resulting in complete aspiration and decompression of the pneumothorax. IMPRESSION: CT-guided core biopsy of cavitary left upper lobe lung nodule measuring approximately 9 x 12 mm by CT today. The procedure was complicated by a tiny lateral pneumothorax treated by needle aspiration which resulted in complete  evacuation. A follow-up chest x-ray will be performed during recovery. Electronically Signed   By: Aletta Edouard M.D.   On: 06/29/2019 11:17   DG Chest Port 1 View  Result Date: 06/29/2019 CLINICAL DATA:  Status post percutaneous biopsy of left upper lobe lung nodule with aspiration small left pneumothorax at the time of the procedure. EXAM: PORTABLE CHEST 1 VIEW COMPARISON:  Imaging during CT-guided lung biopsy earlier today FINDINGS: The heart size and mediastinal contours are within normal limits. No pneumothorax identified. Small peripheral left upper lung opacity corresponds to the nodule sampled earlier today. No evidence of pleural fluid, consolidation or edema. The visualized skeletal structures are unremarkable. IMPRESSION: No pneumothorax after left upper lobe lung biopsy. Electronically Signed   By: Aletta Edouard M.D.   On: 06/29/2019 13:07      ASSESSMENT & PLAN:  1. 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 Labs are reviewed and discussed with patient. Proceed with  cisplatin 40 mg/m today.  #Nausea, symptoms are better.  Continue Compazine as needed.  I also add dexamethasone 8 mg daily for 2 days after each chemotherapy.  Prescription was sent to pharmacy. #Lack of appetite, recommend patient to start Megace 40 mg twice daily.  Prescription was  sent to pharmacy  # Chronic alcohol use #Lung lesion biopsy showed squamous cell carcinoma.-He will proceed with SBRT in the future We discussed about feeding tube placement prior to the treatments  I called patient's wife and updated her about the above plans.  She appreciates the call. All questions were answered. The patient knows to call the clinic with any problems questions or concerns.   Return of visit:  1 week cc Casilda Carls, MD    Earlie Server, MD, PhD Hematology Oncology [facility] Pager- [phone] 07/28/2019

## 2019-07-29 ENCOUNTER — Ambulatory Visit
Admission: RE | Admit: 2019-07-29 | Discharge: 2019-07-29 | Disposition: A | Discharge status: Home / Self Care | Payer: Medicare Other | Source: Ambulatory Visit | Attending: Radiation Oncology | Admitting: Radiation Oncology

## 2019-07-29 ENCOUNTER — Inpatient Hospital Stay: Payer: Medicare Other

## 2019-07-29 DIAGNOSIS — Z510 Encounter for antineoplastic radiation therapy: Secondary | ICD-10-CM | POA: Diagnosis not present

## 2019-07-30 ENCOUNTER — Ambulatory Visit
Admission: RE | Admit: 2019-07-30 | Discharge: 2019-07-30 | Disposition: A | Discharge status: Home / Self Care | Payer: Medicare Other | Source: Ambulatory Visit | Attending: Radiation Oncology | Admitting: Radiation Oncology

## 2019-07-30 ENCOUNTER — Other Ambulatory Visit: Payer: Self-pay

## 2019-07-30 ENCOUNTER — Inpatient Hospital Stay: Payer: Medicare Other

## 2019-07-30 DIAGNOSIS — Z510 Encounter for antineoplastic radiation therapy: Secondary | ICD-10-CM | POA: Diagnosis not present

## 2019-08-02 ENCOUNTER — Ambulatory Visit
Admission: RE | Admit: 2019-08-02 | Discharge: 2019-08-02 | Disposition: A | Discharge status: Home / Self Care | Payer: Medicare Other | Source: Ambulatory Visit | Attending: Radiation Oncology | Admitting: Radiation Oncology

## 2019-08-02 ENCOUNTER — Inpatient Hospital Stay: Payer: Medicare Other

## 2019-08-02 DIAGNOSIS — Z510 Encounter for antineoplastic radiation therapy: Secondary | ICD-10-CM | POA: Diagnosis not present

## 2019-08-03 ENCOUNTER — Inpatient Hospital Stay: Payer: Medicare Other

## 2019-08-03 ENCOUNTER — Ambulatory Visit
Admission: RE | Admit: 2019-08-03 | Discharge: 2019-08-03 | Disposition: A | Discharge status: Home / Self Care | Payer: Medicare Other | Source: Ambulatory Visit | Attending: Radiation Oncology | Admitting: Radiation Oncology

## 2019-08-03 DIAGNOSIS — Z510 Encounter for antineoplastic radiation therapy: Secondary | ICD-10-CM | POA: Diagnosis not present

## 2019-08-04 ENCOUNTER — Inpatient Hospital Stay: Payer: Medicare Other

## 2019-08-04 ENCOUNTER — Encounter: Payer: Self-pay | Admitting: Oncology

## 2019-08-04 ENCOUNTER — Other Ambulatory Visit: Payer: Self-pay

## 2019-08-04 ENCOUNTER — Inpatient Hospital Stay (HOSPITAL_BASED_OUTPATIENT_CLINIC_OR_DEPARTMENT_OTHER): Payer: Medicare Other | Admitting: Oncology

## 2019-08-04 ENCOUNTER — Ambulatory Visit
Admission: RE | Admit: 2019-08-04 | Discharge: 2019-08-04 | Disposition: A | Discharge status: Home / Self Care | Payer: Medicare Other | Source: Ambulatory Visit | Attending: Radiation Oncology | Admitting: Radiation Oncology

## 2019-08-04 VITALS — BP 138/74 | HR 66 | Temp 97.1°F | Wt 109.0 lb

## 2019-08-04 VITALS — BP 146/73 | HR 61 | Temp 96.2°F | Resp 20

## 2019-08-04 DIAGNOSIS — C109 Malignant neoplasm of oropharynx, unspecified: Secondary | ICD-10-CM

## 2019-08-04 DIAGNOSIS — R634 Abnormal weight loss: Secondary | ICD-10-CM

## 2019-08-04 DIAGNOSIS — C3492 Malignant neoplasm of unspecified part of left bronchus or lung: Secondary | ICD-10-CM | POA: Diagnosis not present

## 2019-08-04 DIAGNOSIS — F1010 Alcohol abuse, uncomplicated: Secondary | ICD-10-CM

## 2019-08-04 DIAGNOSIS — Z510 Encounter for antineoplastic radiation therapy: Secondary | ICD-10-CM | POA: Diagnosis not present

## 2019-08-04 DIAGNOSIS — Z7189 Other specified counseling: Secondary | ICD-10-CM

## 2019-08-04 DIAGNOSIS — Z5111 Encounter for antineoplastic chemotherapy: Secondary | ICD-10-CM | POA: Diagnosis not present

## 2019-08-04 DIAGNOSIS — Z95828 Presence of other vascular implants and grafts: Secondary | ICD-10-CM

## 2019-08-04 LAB — CBC WITH DIFFERENTIAL/PLATELET
Abs Immature Granulocytes: 0.03 10*3/uL (ref 0.00–0.07)
Basophils Absolute: 0 10*3/uL (ref 0.0–0.1)
Basophils Relative: 0 %
Eosinophils Absolute: 0 10*3/uL (ref 0.0–0.5)
Eosinophils Relative: 1 %
HCT: 32.7 % — ABNORMAL LOW (ref 39.0–52.0)
Hemoglobin: 10.8 g/dL — ABNORMAL LOW (ref 13.0–17.0)
Immature Granulocytes: 1 %
Lymphocytes Relative: 9 %
Lymphs Abs: 0.5 10*3/uL — ABNORMAL LOW (ref 0.7–4.0)
MCH: 29.8 pg (ref 26.0–34.0)
MCHC: 33 g/dL (ref 30.0–36.0)
MCV: 90.1 fL (ref 80.0–100.0)
Monocytes Absolute: 0.6 10*3/uL (ref 0.1–1.0)
Monocytes Relative: 10 %
Neutro Abs: 4.7 10*3/uL (ref 1.7–7.7)
Neutrophils Relative %: 79 %
Platelets: 201 10*3/uL (ref 150–400)
RBC: 3.63 MIL/uL — ABNORMAL LOW (ref 4.22–5.81)
RDW: 12 % (ref 11.5–15.5)
WBC: 5.8 10*3/uL (ref 4.0–10.5)
nRBC: 0 % (ref 0.0–0.2)

## 2019-08-04 LAB — COMPREHENSIVE METABOLIC PANEL
ALT: 19 U/L (ref 0–44)
AST: 19 U/L (ref 15–41)
Albumin: 3.5 g/dL (ref 3.5–5.0)
Alkaline Phosphatase: 55 U/L (ref 38–126)
Anion gap: 9 (ref 5–15)
BUN: 21 mg/dL (ref 8–23)
CO2: 24 mmol/L (ref 22–32)
Calcium: 8.9 mg/dL (ref 8.9–10.3)
Chloride: 105 mmol/L (ref 98–111)
Creatinine, Ser: 0.8 mg/dL (ref 0.61–1.24)
GFR calc Af Amer: >60 mL/min (ref >60)
GFR calc non Af Amer: >60 mL/min (ref >60)
Glucose, Bld: 99 mg/dL (ref 70–99)
Potassium: 3.8 mmol/L (ref 3.5–5.1)
Sodium: 138 mmol/L (ref 135–145)
Total Bilirubin: 0.9 mg/dL (ref 0.3–1.2)
Total Protein: 7.3 g/dL (ref 6.5–8.1)

## 2019-08-04 MED ORDER — SODIUM CHLORIDE 0.9 % IV SOLN
Freq: Once | INTRAVENOUS | Status: AC
  Filled 2019-08-04: qty 250

## 2019-08-04 MED ORDER — DEXAMETHASONE 4 MG PO TABS: 8.0000 mg | ORAL_TABLET | ORAL | 0 refills | Status: DC

## 2019-08-04 MED ORDER — PALONOSETRON HCL INJECTION 0.25 MG/5ML
0.2500 mg | Freq: Once | INTRAVENOUS | Status: AC
  Administered 2019-08-04: 0.25 mg via INTRAVENOUS
  Filled 2019-08-04: qty 5

## 2019-08-04 MED ORDER — HEPARIN SOD (PORK) LOCK FLUSH 100 UNIT/ML IV SOLN
500.0000 [IU] | Freq: Once | INTRAVENOUS | Status: AC
  Administered 2019-08-04: 500 [IU] via INTRAVENOUS
  Filled 2019-08-04: qty 5

## 2019-08-04 MED ORDER — SODIUM CHLORIDE 0.9 % IV SOLN
10.0000 mg | Freq: Once | INTRAVENOUS | Status: AC
  Administered 2019-08-04: 10 mg via INTRAVENOUS
  Filled 2019-08-04: qty 10

## 2019-08-04 MED ORDER — SODIUM CHLORIDE 0.9 % IV SOLN
150.0000 mg | Freq: Once | INTRAVENOUS | Status: AC
  Administered 2019-08-04: 150 mg via INTRAVENOUS
  Filled 2019-08-04: qty 150

## 2019-08-04 MED ORDER — SODIUM CHLORIDE 0.9 % IV SOLN
40.0000 mg/m2 | Freq: Once | INTRAVENOUS | Status: AC
  Administered 2019-08-04: 62 mg via INTRAVENOUS
  Filled 2019-08-04: qty 62

## 2019-08-04 MED ORDER — SODIUM CHLORIDE 0.9% FLUSH
10.0000 mL | INTRAVENOUS | Status: DC | PRN
  Administered 2019-08-04: 10 mL via INTRAVENOUS
  Filled 2019-08-04: qty 10

## 2019-08-04 MED ORDER — HEPARIN SOD (PORK) LOCK FLUSH 100 UNIT/ML IV SOLN
INTRAVENOUS | Status: AC
  Filled 2019-08-04: qty 5

## 2019-08-04 MED ORDER — POTASSIUM CHLORIDE 2 MEQ/ML IV SOLN
Freq: Once | INTRAVENOUS | Status: AC
  Filled 2019-08-04: qty 1000

## 2019-08-04 NOTE — Progress Notes (Signed)
Hematology/Oncology progress note [facility] [phone] [phone]   Patient Care Team: Casilda Carls, MD as PCP - General (Internal Medicine) Earlie Server, [name], [name]  HISTORY OF PRESENTING ILLNESS:  Gregory Burton is a  79 y.o.  male with PMH listed below was seen in consultation at the request of  Casilda Carls, MD  for evaluation of lymph node.  Patient moved from [place] to [place] for about 3 weeks. He has noticed a neck knot on the right side of his neck. The knot is sore and makes him to cough and he feels it when he swallows. No swallowing difficulty.  Patient was accompanied by his wife. She reports that patient's previous PCP has done neck sonogram for evaluation.  Patient also had right lower molar tooth extraction done a few weeks before he noticed  He also took a course of antibiotics.  Due to his tooth pain, his appetite has decreased and he has lost weight loss, about 20 pounds, he can not specify the time frame.   Alcoholism History of prostate cancer. S/p Radiation/seed, he follows up with [facility].   INTERVAL HISTORY Gregory Burton is a 79 y.o. male who has above history reviewed by me today presents for evaluation prior to chemotherapy. Patient has no new complaints today. Denies any swallowing difficulty. He reports that he has not had any alcohol for 10 days now. Nausea has improved.  Accompanied by wife today. Taking megace 40mg  BID  Review of Systems  Constitutional: Positive for fatigue. Negative for appetite change, chills, fever and unexpected weight change.  HENT:   Negative for hearing loss and voice change.        Mild sore throat  Eyes: Negative for eye problems and icterus.  Respiratory: Negative for chest tightness, cough and shortness of breath.     Cardiovascular: Negative for chest pain and leg swelling.  Gastrointestinal: Negative for abdominal distention, abdominal pain and nausea.  Endocrine: Negative for hot flashes.  Genitourinary: Negative for difficulty urinating, dysuria and frequency.   Musculoskeletal: Negative for arthralgias.  Skin: Negative for itching and rash.  Neurological: Negative for light-headedness and numbness.  Hematological: Negative for adenopathy. Does not bruise/bleed easily.  Psychiatric/Behavioral: Negative for confusion.    MEDICAL HISTORY:  Past Medical History:  Diagnosis Date  . Alcohol abuse   . Blood transfusion without reported diagnosis 2013  . Cataract   . Dementia ([place])   . Glaucoma   . Gout   . Hypercholesteremia   . Hypertension   . Prostate CA ([place]) 2008  . 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 BRAIN SURGERY  2012  . HERNIA REPAIR    . PORTA CATH INSERTION N/A 07/27/2019   Procedure: PORTA CATH INSERTION;  Surgeon: Katha Cabal, MD;  Location: [facility];  Service: Cardiovascular;  Laterality: N/A;    SOCIAL HISTORY: Social History   Socioeconomic History  . Marital status: Married    Spouse name: Not on file  . Number of children: Not on file  . Years of education: Not on file  . Highest education level: Not on file  Occupational History  . Not on file  Tobacco Use  . Smoking status: Former Smoker    Years: 21.00    Types: Cigarettes  Quit date: 05/11/2008    Years since quitting: 11.2  . Smokeless tobacco: Never Used  Substance and Sexual Activity  . Alcohol use: Yes    Comment: 0.5 pint liquor a week  . Drug use: Never  . Sexual activity: Not Currently  Other Topics Concern  . Not on file  Social History Narrative   Lives at home with wife. Wife states he has some dementia but is able to sign his own consent.   Social Determinants of Health   Financial  Resource Strain:   . Difficulty of Paying Living Expenses:   Food Insecurity:   . Worried About Charity fundraiser in the Last Year:   . Arboriculturist in the Last Year:   Transportation Needs:   . Film/video editor (Medical):   Marland Kitchen Lack of Transportation (Non-Medical):   Physical Activity:   . Days of Exercise per Week:   . Minutes of Exercise per Session:   Stress:   . Feeling of Stress :   Social Connections:   . Frequency of Communication with Friends and Family:   . Frequency of Social Gatherings with Friends and Family:   . Attends Religious Services:   . Active Member of Clubs or Organizations:   . Attends Archivist Meetings:   Marland Kitchen Marital Status:   Intimate Partner Violence:   . Fear of Current or Ex-Partner:   . Emotionally Abused:   Marland Kitchen Physically Abused:   . Sexually Abused:     FAMILY HISTORY: No family history on file.  ALLERGIES:  is allergic to enalapril.  MEDICATIONS:  Current Outpatient Medications  Medication Sig Dispense Refill  . atorvastatin (LIPITOR) 20 MG tablet Take 20 mg by mouth daily.    Marland Kitchen dexamethasone (DECADRON) 4 MG tablet Take 2 tablets (8 mg total) by mouth See admin instructions. Take 2 tablets daily for 2 days after each chemotherapy 8 tablet 0  . donepezil (ARICEPT) 10 MG tablet Take 10 mg by mouth at bedtime.    . dorzolamide-timolol (COSOPT) 22.3-6.8 MG/ML ophthalmic solution Place 1 drop into both eyes 2 (two) times daily.     . folic acid (FOLVITE) 1 MG tablet Take 1 mg by mouth daily.    Marland Kitchen latanoprost (XALATAN) 0.005 % ophthalmic solution Place 1 drop into both eyes at bedtime.     . lidocaine-prilocaine (EMLA) cream Apply 1 application topically as needed. Apply small amount to port site approx 1-2 hours prior to appointment. 30 g 1  . megestrol (MEGACE) 40 MG tablet Take 1 tablet (40 mg total) by mouth 2 (two) times daily. 60 tablet 0  . nystatin (MYCOSTATIN) 100000 UNIT/ML suspension Take 5 mLs (500,000 Units total) by  mouth 4 (four) times daily. 473 mL 0  . prednisoLONE 5 MG TABS tablet Take 5 mg by mouth daily as needed (Gout).    . predniSONE (DELTASONE) 5 MG tablet Take 5 mg by mouth daily with breakfast. As needed for gout flare    . prochlorperazine (COMPAZINE) 10 MG tablet Take 1 tablet (10 mg total) by mouth every 6 (six) hours as needed (Nausea or vomiting). (Patient not taking: Reported on 07/22/2019) 30 tablet 1  . Thiamine HCl (VITAMIN B-1 PO) Take 100 mg by mouth daily.      No current facility-administered medications for this visit.   Facility-Administered Medications Ordered in Other Visits  Medication Dose Route Frequency Provider Last Rate Last Admin  . heparin lock flush 100 unit/mL  500  Units Intravenous Once Earlie Server, MD      . sodium chloride flush (NS) 0.9 % injection 10 mL  10 mL Intravenous PRN Earlie Server, MD   10 mL at 08/04/19 0826     PHYSICAL EXAMINATION: ECOG PERFORMANCE STATUS: 1 - Symptomatic but completely ambulatory Vitals:   08/04/19 0846  BP: 138/74  Pulse: 66  Temp: (!) 97.1 F (36.2 C)  SpO2: 91%   Filed Weights   08/04/19 0846  Weight: 109 lb (49.4 kg)    Physical Exam Constitutional:      General: He is not in acute distress. HENT:     Head: Normocephalic and atraumatic.  Eyes:     General: No scleral icterus. Neck:     Comments: Palpable 1-2cm right neck mass  Cardiovascular:     Rate and Rhythm: Normal rate and regular rhythm.     Heart sounds: Normal heart sounds.  Pulmonary:     Effort: Pulmonary effort is normal. No respiratory distress.     Breath sounds: No wheezing.  Abdominal:     General: Bowel sounds are normal. There is no distension.     Palpations: Abdomen is soft.  Musculoskeletal:        General: No deformity. Normal range of motion.     Cervical back: Normal range of motion and neck supple.  Skin:    General: Skin is warm and dry.     Findings: No erythema or rash.  Neurological:     Mental Status: He is alert and oriented  to person, place, and time. Mental status is at baseline.     Cranial Nerves: No cranial nerve deficit.     Coordination: Coordination normal.  Psychiatric:        Mood and Affect: Mood normal.     LABORATORY DATA:  I have reviewed the data as listed Lab Results  Component Value Date   WBC 6.3 07/28/2019   HGB 11.2 (L) 07/28/2019   HCT 33.9 (L) 07/28/2019   MCV 89.4 07/28/2019   PLT 242 07/28/2019   Recent Labs    07/19/19 0835 07/23/19 0924 07/28/19 0830  NA 140 134* 137  K 4.6 4.5 3.9  CL 104 97* 100  CO2 28 29 29   GLUCOSE 108* 98 103*  BUN 11 17 15   CREATININE 0.79 0.98 0.76  CALCIUM 9.3 9.1 9.2  GFRNONAA >60 >60 >60  GFRAA >60 >60 >60  PROT 7.8 7.4 7.5  ALBUMIN 3.7 3.5 3.6  AST 20 21 18   ALT 17 19 17   ALKPHOS 62 58 58  BILITOT 1.2 1.2 0.8   Iron/TIBC/Ferritin/ %Sat No results found for: IRON, TIBC, FERRITIN, IRONPCTSAT    RADIOGRAPHIC STUDIES: I have personally reviewed the radiological images as listed and agreed with the findings in the report. 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 Alcohol abuse   4. 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 Labs are reviewed and discussed with patient. Proceed with cisplatin 40mg /m2 today.    #Nausea, resolved. Continue  Compazine Q6h as needed.  Dexamethasone 8mg  daily for 2 days after chemo.   # Weight loss, decreased appetite, continue Megace 40mg  BID.  # Chronic alcohol use #Lung lesion biopsy showed squamous cell carcinoma.-He will proceed with SBRT in the future We discussed about feeding tube placement prior to the treatments  I called patient's wife and updated her about the above plans.  She appreciates the call. All questions were answered. The patient knows to call the clinic with any problems questions or concerns.   Return of visit:  1 week   Earlie Server, MD, PhD Hematology Oncology [facility] at [facility] Pager- [phone] 08/04/2019

## 2019-08-04 NOTE — Progress Notes (Signed)
Patient is feeling anxious today.

## 2019-08-05 ENCOUNTER — Inpatient Hospital Stay: Payer: Medicare Other

## 2019-08-05 ENCOUNTER — Ambulatory Visit
Admission: RE | Admit: 2019-08-05 | Discharge: 2019-08-05 | Disposition: A | Discharge status: Home / Self Care | Payer: Medicare Other | Source: Ambulatory Visit | Attending: Radiation Oncology | Admitting: Radiation Oncology

## 2019-08-05 DIAGNOSIS — Z510 Encounter for antineoplastic radiation therapy: Secondary | ICD-10-CM | POA: Diagnosis not present

## 2019-08-05 NOTE — Progress Notes (Signed)
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  Patient receiving concurrent chemotherapy and radiation therapy.   Met with patient and wife following radiation today.  Patient reports that appetite is good.  Patient reports feeling anxious.  Reports eating eggs with cheese or egg and cheese sandwich for breakfast.  Lunch is soup (tomato). Drinks ensure about 3 times per day.   Reports some dry mouth.  Likes to suck on riccola drops.     Medications: megace  Labs: reviewed  Anthropometrics:   Weight 109 lb decreased from 112 lb on 5/24 5/5 111 lb 111 lb on 4/27   NUTRITION DIAGNOSIS: Inadequate oral intake continues   INTERVENTION:  Discussed with patient and wife ways to increase calories and protein. High Calorie, High Protein Nutrition Therapy from AND handouts given to wife.   Discussed ways to increase calories in tomato soup. Encouraged patient increase ensure to 4 times per day (1400 calories, 80 g protein) Complimentary case of ensure enlive given to patient today.  Answered question regarding plastic utensils vs metal.  Contact information provided    MONITORING, EVALUATION, GOAL: weight trends, intake   NEXT VISIT: June 17 following radiation  Sashay Felling B. Zenia Resides, [place], [place] Registered Dietitian [phone] (pager)

## 2019-08-06 ENCOUNTER — Ambulatory Visit
Admission: RE | Admit: 2019-08-06 | Discharge: 2019-08-06 | Disposition: A | Discharge status: Home / Self Care | Payer: Medicare Other | Source: Ambulatory Visit | Attending: Radiation Oncology | Admitting: Radiation Oncology

## 2019-08-06 ENCOUNTER — Inpatient Hospital Stay: Payer: Medicare Other

## 2019-08-06 DIAGNOSIS — Z510 Encounter for antineoplastic radiation therapy: Secondary | ICD-10-CM | POA: Diagnosis not present

## 2019-08-09 ENCOUNTER — Inpatient Hospital Stay: Payer: Medicare Other

## 2019-08-09 ENCOUNTER — Ambulatory Visit
Admission: RE | Admit: 2019-08-09 | Discharge: 2019-08-09 | Disposition: A | Discharge status: Home / Self Care | Payer: Medicare Other | Source: Ambulatory Visit | Attending: Radiation Oncology | Admitting: Radiation Oncology

## 2019-08-09 DIAGNOSIS — Z510 Encounter for antineoplastic radiation therapy: Secondary | ICD-10-CM | POA: Diagnosis not present

## 2019-08-10 ENCOUNTER — Other Ambulatory Visit: Payer: Self-pay | Admitting: *Deleted

## 2019-08-10 ENCOUNTER — Ambulatory Visit
Admission: RE | Admit: 2019-08-10 | Discharge: 2019-08-10 | Disposition: A | Discharge status: Home / Self Care | Payer: Medicare Other | Source: Ambulatory Visit | Attending: Radiation Oncology | Admitting: Radiation Oncology

## 2019-08-10 ENCOUNTER — Inpatient Hospital Stay: Payer: Medicare Other

## 2019-08-10 DIAGNOSIS — Z510 Encounter for antineoplastic radiation therapy: Secondary | ICD-10-CM | POA: Diagnosis not present

## 2019-08-11 ENCOUNTER — Ambulatory Visit
Admission: RE | Admit: 2019-08-11 | Discharge: 2019-08-11 | Disposition: A | Discharge status: Home / Self Care | Payer: Medicare Other | Source: Ambulatory Visit | Attending: Radiation Oncology | Admitting: Radiation Oncology

## 2019-08-11 ENCOUNTER — Inpatient Hospital Stay: Payer: Medicare Other

## 2019-08-11 ENCOUNTER — Inpatient Hospital Stay (HOSPITAL_BASED_OUTPATIENT_CLINIC_OR_DEPARTMENT_OTHER): Payer: Medicare Other | Admitting: Oncology

## 2019-08-11 ENCOUNTER — Encounter: Payer: Self-pay | Admitting: Oncology

## 2019-08-11 ENCOUNTER — Other Ambulatory Visit: Payer: Self-pay

## 2019-08-11 VITALS — BP 132/79 | HR 67 | Temp 98.7°F | Resp 18 | Wt 108.3 lb

## 2019-08-11 DIAGNOSIS — R634 Abnormal weight loss: Secondary | ICD-10-CM | POA: Insufficient documentation

## 2019-08-11 DIAGNOSIS — C109 Malignant neoplasm of oropharynx, unspecified: Secondary | ICD-10-CM

## 2019-08-11 DIAGNOSIS — C3492 Malignant neoplasm of unspecified part of left bronchus or lung: Secondary | ICD-10-CM | POA: Diagnosis not present

## 2019-08-11 DIAGNOSIS — Z7189 Other specified counseling: Secondary | ICD-10-CM

## 2019-08-11 DIAGNOSIS — F1010 Alcohol abuse, uncomplicated: Secondary | ICD-10-CM | POA: Diagnosis not present

## 2019-08-11 DIAGNOSIS — Z5111 Encounter for antineoplastic chemotherapy: Secondary | ICD-10-CM | POA: Insufficient documentation

## 2019-08-11 DIAGNOSIS — R112 Nausea with vomiting, unspecified: Secondary | ICD-10-CM

## 2019-08-11 DIAGNOSIS — Z510 Encounter for antineoplastic radiation therapy: Secondary | ICD-10-CM | POA: Diagnosis not present

## 2019-08-11 DIAGNOSIS — R1110 Vomiting, unspecified: Secondary | ICD-10-CM | POA: Insufficient documentation

## 2019-08-11 LAB — CBC WITH DIFFERENTIAL/PLATELET
Abs Immature Granulocytes: 0.02 10*3/uL (ref 0.00–0.07)
Basophils Absolute: 0 10*3/uL (ref 0.0–0.1)
Basophils Relative: 0 %
Eosinophils Absolute: 0 10*3/uL (ref 0.0–0.5)
Eosinophils Relative: 0 %
HCT: 30.4 % — ABNORMAL LOW (ref 39.0–52.0)
Hemoglobin: 9.8 g/dL — ABNORMAL LOW (ref 13.0–17.0)
Immature Granulocytes: 1 %
Lymphocytes Relative: 7 %
Lymphs Abs: 0.3 10*3/uL — ABNORMAL LOW (ref 0.7–4.0)
MCH: 29.2 pg (ref 26.0–34.0)
MCHC: 32.2 g/dL (ref 30.0–36.0)
MCV: 90.5 fL (ref 80.0–100.0)
Monocytes Absolute: 0.3 10*3/uL (ref 0.1–1.0)
Monocytes Relative: 8 %
Neutro Abs: 3.6 10*3/uL (ref 1.7–7.7)
Neutrophils Relative %: 84 %
Platelets: 161 10*3/uL (ref 150–400)
RBC: 3.36 MIL/uL — ABNORMAL LOW (ref 4.22–5.81)
RDW: 12.2 % (ref 11.5–15.5)
WBC: 4.3 10*3/uL (ref 4.0–10.5)
nRBC: 0 % (ref 0.0–0.2)

## 2019-08-11 LAB — COMPREHENSIVE METABOLIC PANEL
ALT: 19 U/L (ref 0–44)
AST: 16 U/L (ref 15–41)
Albumin: 3.2 g/dL — ABNORMAL LOW (ref 3.5–5.0)
Alkaline Phosphatase: 54 U/L (ref 38–126)
Anion gap: 10 (ref 5–15)
BUN: 30 mg/dL — ABNORMAL HIGH (ref 8–23)
CO2: 24 mmol/L (ref 22–32)
Calcium: 8.6 mg/dL — ABNORMAL LOW (ref 8.9–10.3)
Chloride: 107 mmol/L (ref 98–111)
Creatinine, Ser: 0.88 mg/dL (ref 0.61–1.24)
GFR calc Af Amer: >60 mL/min (ref >60)
GFR calc non Af Amer: >60 mL/min (ref >60)
Glucose, Bld: 108 mg/dL — ABNORMAL HIGH (ref 70–99)
Potassium: 3.8 mmol/L (ref 3.5–5.1)
Sodium: 141 mmol/L (ref 135–145)
Total Bilirubin: 0.8 mg/dL (ref 0.3–1.2)
Total Protein: 6.9 g/dL (ref 6.5–8.1)

## 2019-08-11 MED ORDER — PALONOSETRON HCL INJECTION 0.25 MG/5ML
0.2500 mg | Freq: Once | INTRAVENOUS | Status: AC
  Administered 2019-08-11: 0.25 mg via INTRAVENOUS
  Filled 2019-08-11: qty 5

## 2019-08-11 MED ORDER — HEPARIN SOD (PORK) LOCK FLUSH 100 UNIT/ML IV SOLN
INTRAVENOUS | Status: AC
  Filled 2019-08-11: qty 5

## 2019-08-11 MED ORDER — POTASSIUM CHLORIDE 2 MEQ/ML IV SOLN
Freq: Once | INTRAVENOUS | Status: AC
  Filled 2019-08-11: qty 1000

## 2019-08-11 MED ORDER — SODIUM CHLORIDE 0.9 % IV SOLN
150.0000 mg | Freq: Once | INTRAVENOUS | Status: AC
  Administered 2019-08-11: 150 mg via INTRAVENOUS
  Filled 2019-08-11: qty 150

## 2019-08-11 MED ORDER — SODIUM CHLORIDE 0.9 % IV SOLN
Freq: Once | INTRAVENOUS | Status: AC
  Filled 2019-08-11: qty 250

## 2019-08-11 MED ORDER — HEPARIN SOD (PORK) LOCK FLUSH 100 UNIT/ML IV SOLN
500.0000 [IU] | Freq: Once | INTRAVENOUS | Status: AC | PRN
  Administered 2019-08-11: 500 [IU]
  Filled 2019-08-11: qty 5

## 2019-08-11 MED ORDER — SODIUM CHLORIDE 0.9% FLUSH
10.0000 mL | INTRAVENOUS | Status: DC | PRN
  Administered 2019-08-11: 10 mL
  Filled 2019-08-11: qty 10

## 2019-08-11 MED ORDER — SODIUM CHLORIDE 0.9 % IV SOLN
40.0000 mg/m2 | Freq: Once | INTRAVENOUS | Status: AC
  Administered 2019-08-11: 62 mg via INTRAVENOUS
  Filled 2019-08-11: qty 62

## 2019-08-11 MED ORDER — SODIUM CHLORIDE 0.9 % IV SOLN
10.0000 mg | Freq: Once | INTRAVENOUS | Status: AC
  Administered 2019-08-11: 10 mg via INTRAVENOUS
  Filled 2019-08-11: qty 10

## 2019-08-11 NOTE — Progress Notes (Signed)
Hematology/Oncology progress note [facility] [phone] [phone]   Patient Care Team: Casilda Carls, MD as PCP - General (Internal Medicine) Earlie Server, [name], [name]  HISTORY OF PRESENTING ILLNESS:  Gregory Burton is a  79 y.o.  male with PMH listed below was seen in consultation at the request of  Casilda Carls, MD  for evaluation of lymph node.  Patient moved from [place] to [place] for about 3 weeks. He has noticed a neck knot on the right side of his neck. The knot is sore and makes him to cough and he feels it when he swallows. No swallowing difficulty.  Patient was accompanied by his wife. She reports that patient's previous PCP has done neck sonogram for evaluation.  Patient also had right lower molar tooth extraction done a few weeks before he noticed  He also took a course of antibiotics.  Due to his tooth pain, his appetite has decreased and he has lost weight loss, about 20 pounds, he can not specify the time frame.   Alcoholism History of prostate cancer. S/p Radiation/seed, he follows up with [facility].   INTERVAL HISTORY Gregory Burton is a 79 y.o. male who has above history reviewed by me today presents for evaluation prior to chemotherapy.  Patient has been on concurrent chemotherapy and radiation with cisplatin.   Nausea symptoms has improved.  Patient uses  dexamethasone 8 mg daily for 2 days after each chemotherapy.   He reports last drink was about 17 days ago. Denies any dysphagia.  He has some sore throat.  He uses mouth rinse. Appetite is fair per wife.  Review of Systems  Constitutional: Positive for fatigue. Negative for appetite change, chills, fever and unexpected weight change.  HENT:   Negative for hearing loss and voice change.        Mild sore throat  Eyes:  Negative for eye problems and icterus.  Respiratory: Negative for chest tightness, cough and shortness of breath.   Cardiovascular: Negative for chest pain and leg swelling.  Gastrointestinal: Negative for abdominal distention, abdominal pain, blood in stool and nausea.  Endocrine: Negative for hot flashes.  Genitourinary: Negative for difficulty urinating, dysuria and frequency.   Musculoskeletal: Negative for arthralgias.  Skin: Negative for itching and rash.  Neurological: Negative for extremity weakness, light-headedness and numbness.  Hematological: Negative for adenopathy. Does not bruise/bleed easily.  Psychiatric/Behavioral: Negative for confusion.    MEDICAL HISTORY:  Past Medical History:  Diagnosis Date  . Alcohol abuse   . Blood transfusion without reported diagnosis 2013  . Cataract   . Dementia ([place])   . Glaucoma   . Gout   . Hypercholesteremia   . Hypertension   . Prostate CA ([place]) 2008  . 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 BRAIN SURGERY  2012  . HERNIA REPAIR    . PORTA CATH INSERTION N/A 07/27/2019   Procedure: PORTA CATH INSERTION;  Surgeon: Katha Cabal, MD;  Location: [facility];  Service: Cardiovascular;  Laterality: N/A;    SOCIAL HISTORY: Social History   Socioeconomic History  . Marital status: Married    Spouse name: Not on file  . Number of children: Not on file  . Years of education: Not on file  . Highest education level:  Not on file  Occupational History  . Not on file  Tobacco Use  . Smoking status: Former Smoker    Years: 21.00    Types: Cigarettes    Quit date: 05/11/2008    Years since quitting: 11.2  . Smokeless tobacco: Never Used  Vaping Use  . Vaping Use: Never used  Substance and Sexual Activity  . Alcohol use: Yes    Comment: 0.5 pint liquor a week  . Drug use: Never  . Sexual activity: Not Currently  Other Topics Concern    . Not on file  Social History Narrative   Lives at home with wife. Wife states he has some dementia but is able to sign his own consent.   Social Determinants of Health   Financial Resource Strain:   . Difficulty of Paying Living Expenses:   Food Insecurity:   . Worried About Charity fundraiser in the Last Year:   . Arboriculturist in the Last Year:   Transportation Needs:   . Film/video editor (Medical):   Marland Kitchen Lack of Transportation (Non-Medical):   Physical Activity:   . Days of Exercise per Week:   . Minutes of Exercise per Session:   Stress:   . Feeling of Stress :   Social Connections:   . Frequency of Communication with Friends and Family:   . Frequency of Social Gatherings with Friends and Family:   . Attends Religious Services:   . Active Member of Clubs or Organizations:   . Attends Archivist Meetings:   Marland Kitchen Marital Status:   Intimate Partner Violence:   . Fear of Current or Ex-Partner:   . Emotionally Abused:   Marland Kitchen Physically Abused:   . Sexually Abused:     FAMILY HISTORY: No family history on file.  ALLERGIES:  is allergic to enalapril.  MEDICATIONS:  Current Outpatient Medications  Medication Sig Dispense Refill  . atorvastatin (LIPITOR) 20 MG tablet Take 20 mg by mouth daily.    Marland Kitchen dexamethasone (DECADRON) 4 MG tablet Take 2 tablets (8 mg total) by mouth See admin instructions. Take 2 tablets daily for 2 days after each chemotherapy 16 tablet 0  . donepezil (ARICEPT) 10 MG tablet Take 10 mg by mouth at bedtime.    . dorzolamide-timolol (COSOPT) 22.3-6.8 MG/ML ophthalmic solution Place 1 drop into both eyes 2 (two) times daily.     . folic acid (FOLVITE) 1 MG tablet Take 1 mg by mouth daily.    Marland Kitchen latanoprost (XALATAN) 0.005 % ophthalmic solution Place 1 drop into both eyes at bedtime.     . lidocaine-prilocaine (EMLA) cream Apply 1 application topically as needed. Apply small amount to port site approx 1-2 hours prior to appointment. 30 g 1  .  megestrol (MEGACE) 40 MG tablet Take 1 tablet (40 mg total) by mouth 2 (two) times daily. 60 tablet 0  . nystatin (MYCOSTATIN) 100000 UNIT/ML suspension Take 5 mLs (500,000 Units total) by mouth 4 (four) times daily. (Patient not taking: Reported on 08/04/2019) 473 mL 0  . prednisoLONE 5 MG TABS tablet Take 5 mg by mouth daily as needed (Gout).    . predniSONE (DELTASONE) 5 MG tablet Take 5 mg by mouth daily with breakfast. As needed for gout flare    . prochlorperazine (COMPAZINE) 10 MG tablet Take 1 tablet (10 mg total) by mouth every 6 (six) hours as needed (Nausea or vomiting). (Patient not taking: Reported on 07/22/2019) 30 tablet 1  . Thiamine  HCl (VITAMIN B-1 PO) Take 100 mg by mouth daily.      No current facility-administered medications for this visit.     PHYSICAL EXAMINATION: ECOG PERFORMANCE STATUS: 1 - Symptomatic but completely ambulatory Vitals:   08/11/19 0853  BP: 132/79  Pulse: 67  Resp: 18  Temp: 98.7 F (37.1 C)  SpO2: 96%   Filed Weights   08/11/19 0853  Weight: 108 lb 4.8 oz (49.1 kg)    Physical Exam Constitutional:      General: He is not in acute distress. HENT:     Head: Normocephalic and atraumatic.  Eyes:     General: No scleral icterus. Neck:     Comments: Palpable 1-2cm right neck mass  Cardiovascular:     Rate and Rhythm: Normal rate and regular rhythm.     Heart sounds: Normal heart sounds.  Pulmonary:     Effort: Pulmonary effort is normal. No respiratory distress.     Breath sounds: No wheezing.  Abdominal:     General: Bowel sounds are normal. There is no distension.     Palpations: Abdomen is soft.  Musculoskeletal:        General: No deformity. Normal range of motion.     Cervical back: Normal range of motion and neck supple.  Skin:    General: Skin is warm and dry.     Findings: No erythema or rash.  Neurological:     Mental Status: He is alert and oriented to person, place, and time. Mental status is at baseline.     Cranial  Nerves: No cranial nerve deficit.     Coordination: Coordination normal.  Psychiatric:        Mood and Affect: Mood normal.     LABORATORY DATA:  I have reviewed the data as listed Lab Results  Component Value Date   WBC 5.8 08/04/2019   HGB 10.8 (L) 08/04/2019   HCT 32.7 (L) 08/04/2019   MCV 90.1 08/04/2019   PLT 201 08/04/2019   Recent Labs    07/23/19 0924 07/28/19 0830 08/04/19 0826  NA 134* 137 138  K 4.5 3.9 3.8  CL 97* 100 105  CO2 29 29 24   GLUCOSE 98 103* 99  BUN 17 15 21   CREATININE 0.98 0.76 0.80  CALCIUM 9.1 9.2 8.9  GFRNONAA >60 >60 >60  GFRAA >60 >60 >60  PROT 7.4 7.5 7.3  ALBUMIN 3.5 3.6 3.5  AST 21 18 19   ALT 19 17 19   ALKPHOS 58 58 55  BILITOT 1.2 0.8 0.9   Iron/TIBC/Ferritin/ %Sat No results found for: IRON, TIBC, FERRITIN, IRONPCTSAT    RADIOGRAPHIC STUDIES: I have personally reviewed the radiological images as listed and agreed with the findings in the report. PERIPHERAL VASCULAR CATHETERIZATION  Result Date: 07/27/2019 See op note CT SOFT TISSUE NECK W CONTRAST  Result Date: 05/27/2019 CLINICAL DATA:  Right neck mass EXAM: CT NECK WITH CONTRAST TECHNIQUE: Multidetector CT imaging of the neck was performed using the standard protocol following the bolus administration of intravenous contrast. CONTRAST:  79mL OMNIPAQUE IOHEXOL 300 MG/ML  SOLN COMPARISON:  None. FINDINGS: Pharynx and larynx: Enhancing mass in the right piriform sinus measuring 17 mm in diameter. Appearance is concerning for neoplasm. Possible extension into the base of the epiglottis on the right. Base of tongue normal. No airway compromise. Larynx normal. Salivary glands: No inflammation, mass, or stone. Thyroid: Negative Lymph nodes: Cystic mass in the right lateral neck at the level of the piriform sinus. Cyst  measures 15 x 24 mm and is deep to the sternocleidomastoid muscle. There is mild enhancement of the wall the cyst which is likely a necrotic lymph node. Cystic mass in the  retropharyngeal soft tissues on the right in the region of the nasopharynx. This measures approximately 18 mm in diameter and likely is a necrotic lymph node. Cluster of lymph nodes in the right posterior neck measuring up to 12 mm in diameter. These are also likely malignant lymph nodes. Vascular: Normal vascular enhancement. Atherosclerotic calcification is present in the aortic arch and carotid bifurcation. 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 Posterior lymph node on the right appears to extend into the right jugular vein without occlusion. Limited intracranial: Negative Visualized orbits: Negative Mastoids and visualized paranasal sinuses: Mild mucosal edema in the ethmoid sinuses. Remaining sinuses clear. Mastoid clear bilaterally. Skeleton: Cervical spondylosis. Thoracic scoliosis. No acute skeletal abnormality. Right lower molar extraction site without complication. Upper chest: 10 mm spiculated nodule left upper lobe with central necrosis. Right apex clear. Other: None IMPRESSION: 1. Solid enhancing mass in the right piriform sinus measuring 17 mm, highly suspicious for carcinoma. 2. Multiple lymph nodes in the right neck including cystic nodes in the right posterior nasopharynx and right lateral neck. Posterior lymph nodes in the right neck also likely malignant, 1 of which extends into the right jugular vein. Tissue sampling and direct laryngoscopy recommended to evaluate for neoplasm. 3. 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 PET-CT would be helpful for further evaluation and staging. 4. Atherosclerotic disease. 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 This appears to be a flow limiting lesion with decreased caliber of the right internal carotid artery which is patent. Electronically Signed    By: Franchot Gallo M.D.   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 A total of Versed 1.0 mg and Fentanyl 50 mcg was administered intravenously. Moderate Sedation Time: 12 minutes. The patient's level of consciousness and vital signs were monitored continuously by radiology nursing throughout the procedure under my direct supervision. FLUOROSCOPY TIME:  Fluoroscopy Time: None. COMPLICATIONS: None immediate. PROCEDURE: Informed written consent was obtained from the patient after a thorough discussion of the procedural risks, benefits and alternatives. All questions were addressed. Maximal Sterile Barrier Technique was utilized including caps, mask, sterile gowns, sterile gloves, sterile drape, hand hygiene and skin antiseptic. A timeout was performed prior to the initiation of the procedure. Previous imaging reviewed. Preliminary ultrasound performed. The right cervical abnormal adenopathy was localized and marked. Ultrasound aspiration: Under sterile conditions and local anesthesia, an 18 gauge needle was advanced into the cystic necrotic portion of the adenopathy. Syringe aspiration yielded 2 cc exudative fluid. Sample sent for cytology. Needle removed. Ultrasound core biopsy: Under sterile conditions and local anesthesia, an 18 gauge core biopsy needle was advanced to the residual component of the right cervical adenopathy. 18 gauge core biopsies obtained. These were placed in formalin. Needle removed. Postprocedure imaging demonstrates no hemorrhage or hematoma. Patient tolerated biopsy well.  IMPRESSION: Successful ultrasound  aspiration for cytology and core biopsy for pathology of the right neck necrotic cervical adenopathy. Electronically Signed   By: Jerilynn Mages.  Shick M.D.   On: 06/11/2019 13:10   PERIPHERAL VASCULAR CATHETERIZATION  Result Date: 07/27/2019 See op note  NM PET Image Initial (PI) Skull Base To Thigh  Result Date: 06/07/2019 CLINICAL DATA:  Initial treatment strategy for neck mass. EXAM: NUCLEAR MEDICINE PET SKULL BASE TO THIGH TECHNIQUE: 6.57 mCi F-18 FDG was injected intravenously. Full-ring PET imaging was performed from the skull base to thigh after the radiotracer. CT data was obtained and used for attenuation correction and anatomic localization. Fasting blood glucose: The 56 mg/dl COMPARISON:  CT 05/27/2019 FINDINGS: Mediastinal blood pool activity: SUV max 2.39 Liver activity: SUV max NA NECK: Base of tongue mass extending into the right peer form sinus is identified. This has an SUV max of 12.69. Multiple right-sided FDG avid lymph nodes are identified. Index right level 2 node within the measures 2.5 cm within SUV max of 5.7. Right level 2 node posterior and medial to the parotid gland measures 2.2 cm within SUV max of 9.6. Right FDG avid node at the level of the piriform sinus measures 2.2 cm within SUV max of 5.8. Incidental CT findings: none CHEST: No FDG avid axillary, supraclavicular, mediastinal, or hilar lymph nodes. Within the posterolateral left upper lobe there is a nodule with central cavitary component measuring 1 cm. This has an SUV max of 1.8, image 89/3. Incidental CT findings: A few scattered calcified pleural plaques are noted suggesting previous asbestos exposure. Aortic atherosclerosis. Three vessel coronary artery atherosclerotic calcifications. ABDOMEN/PELVIS: No abnormal hypermetabolic activity within the liver, pancreas, adrenal glands, or spleen. No hypermetabolic lymph nodes in the abdomen or pelvis. Incidental CT findings: Aortic atherosclerosis. Seed  implants noted within the prostate gland. SKELETON: No focal hypermetabolic activity to suggest skeletal metastasis. Incidental CT findings: none IMPRESSION: 1. 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 2. Multiple FDG avid cervical lymph nodes compatible with ipsilateral nodal metastasis. No FDG avid left cervical lymph nodes identified. 3. Left upper lobe pulmonary nodule exhibits mild FDG uptake within SUV max of 1.8. Cannot rule out pulmonary metastasis. 4. 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 No findings of pulmonary asbestosis. 5. Coronary artery calcifications 6.  Aortic Atherosclerosis (ICD10-I70.0). Electronically Signed   By: Kerby Moors M.D.   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 Staging imaging as also demonstrated a roughly 1 cm cavitary nodule in the left upper lobe. The patient presents for biopsy to determine whether this represents metastatic disease, primary lung carcinoma or benign nodule. EXAM: CT GUIDED CORE BIOPSY OF LEFT UPPER LOBE LUNG NODULE ANESTHESIA/SEDATION: 2.0 mg IV Versed; 100 mcg IV Fentanyl Total Moderate Sedation Time:  22 minutes. The patient's level of consciousness and physiologic status were continuously monitored during the procedure by Radiology nursing. PROCEDURE: The procedure risks, benefits, and alternatives were explained to the patient and his wife. Questions regarding the procedure were encouraged and answered. The patient and his wife understand and consent to the procedure. A time-out was performed prior to initiating the procedure. Supine imaging was performed through the upper to mid chest. The left upper chest wall was prepped with chlorhexidine in a sterile fashion, and a sterile drape was applied covering the operative field. A sterile  gown  and sterile gloves were used for the procedure. Local anesthesia was provided with 1% Lidocaine. Under CT guidance, a 17 gauge trocar needle was advanced to the level of a left upper lobe pulmonary nodule. After confirming needle tip position, 2 separate coaxial 18 gauge core biopsy samples were obtained. Additional imaging was performed. Aspiration was then performed via the outer needle at the level of the pleural space. Additional CT was performed after needle removal. COMPLICATIONS: Tiny left lateral pneumothorax. SIR level B: Nominal therapy (including overnight admission for observation), no consequence. FINDINGS: Small lateral and posterior left upper lobe nodule again demonstrated which measures approximately 9 x 12 mm. This nodule demonstrates central cavitation. One small tissue fragment and a second more intact core biopsy sample were able to be obtained. During the procedure, a small left lateral pneumothorax was sustained. This was treated with aspiration via the outer needle upon retraction resulting in complete aspiration and decompression of the pneumothorax. IMPRESSION: CT-guided core biopsy of cavitary left upper lobe lung nodule measuring approximately 9 x 12 mm by CT today. The procedure was complicated by a tiny lateral pneumothorax treated by needle aspiration which resulted in complete evacuation. A follow-up chest x-ray will be performed during recovery. Electronically Signed   By: Aletta Edouard M.D.   On: 06/29/2019 11:17   DG Chest Port 1 View  Result Date: 06/29/2019 CLINICAL DATA:  Status post percutaneous biopsy of left upper lobe lung nodule with aspiration small left pneumothorax at the time of the procedure. EXAM: PORTABLE CHEST 1 VIEW COMPARISON:  Imaging during CT-guided lung biopsy earlier today FINDINGS: The heart size and mediastinal contours are within normal limits. No pneumothorax identified. Small peripheral left upper lung opacity corresponds to the nodule sampled  earlier today. No evidence of pleural fluid, consolidation or edema. The visualized skeletal structures are unremarkable. IMPRESSION: No pneumothorax after left upper lobe lung biopsy. Electronically Signed   By: Aletta Edouard M.D.   On: 06/29/2019 13:07   [place] CORE BIOPSY (LYMPH NODES)  Result Date: 06/11/2019 INDICATION: PET positive right cervical necrotic adenopathy, concern for head neck squamous cell carcinoma EXAM: Ultrasound aspiration of the right cervical necrotic adenopathy for cytology Ultrasound core biopsy of the right cervical necrotic adenopathy for pathology MEDICATIONS: 1% lidocaine local ANESTHESIA/SEDATION: Moderate (conscious) sedation was employed during this procedure. A total of Versed 1.0 mg and Fentanyl 50 mcg was administered intravenously. Moderate Sedation Time: 12 minutes. The patient's level of consciousness and vital signs were monitored continuously by radiology nursing throughout the procedure under my direct supervision. FLUOROSCOPY TIME:  Fluoroscopy Time: None. COMPLICATIONS: None immediate. PROCEDURE: Informed written consent was obtained from the patient after a thorough discussion of the procedural risks, benefits and alternatives. All questions were addressed. Maximal Sterile Barrier Technique was utilized including caps, mask, sterile gowns, sterile gloves, sterile drape, hand hygiene and skin antiseptic. A timeout was performed prior to the initiation of the procedure. Previous imaging reviewed. Preliminary ultrasound performed. The right cervical abnormal adenopathy was localized and marked. Ultrasound aspiration: Under sterile conditions and local anesthesia, an 18 gauge needle was advanced into the cystic necrotic portion of the adenopathy. Syringe aspiration yielded 2 cc exudative fluid. Sample sent for cytology. Needle removed. Ultrasound core biopsy: Under sterile conditions and local anesthesia, an 18 gauge core biopsy needle was advanced to the residual  component of the right cervical adenopathy. 18 gauge core biopsies obtained. These were placed in formalin. Needle removed. Postprocedure imaging demonstrates no hemorrhage or hematoma. Patient tolerated biopsy  well. IMPRESSION: Successful ultrasound aspiration for cytology and core biopsy for pathology of the right neck necrotic cervical adenopathy. Electronically Signed   By: Jerilynn Mages.  Shick M.D.   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 Alcohol abuse   4. Weight loss   5. Non-intractable vomiting with nausea, unspecified vomiting type   6. 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 Continue concurrent chemotherapy with radiation .  Proceed with cisplatin 40 mg/m today  #Nausea, symptom has resolved.  Continue Compazine Q6h as needed.  Dexamethasone 8mg  daily for 2 days after chemo.   # Weight loss, his weight is relatively stable.  Continue nutrition supplementation.  Continue Megace 40 mg twice daily. # Chronic alcohol use, per patient he has stopped drinking alcohol. #Lung lesion biopsy showed squamous cell carcinoma.-He will proceed with SBRT in the future We discussed about feeding tube placement prior to the treatments All questions were answered. The patient knows to call the clinic with any problems questions or concerns.   Return of visit:  1  week   Earlie Server, MD, PhD Hematology Oncology [facility] Pager- [phone] 08/11/2019

## 2019-08-11 NOTE — Progress Notes (Signed)
Patient denied any problems/concerns.

## 2019-08-12 ENCOUNTER — Ambulatory Visit
Admission: RE | Admit: 2019-08-12 | Discharge: 2019-08-12 | Disposition: A | Discharge status: Home / Self Care | Payer: Medicare Other | Source: Ambulatory Visit | Attending: Radiation Oncology | Admitting: Radiation Oncology

## 2019-08-12 ENCOUNTER — Inpatient Hospital Stay: Payer: Medicare Other

## 2019-08-12 DIAGNOSIS — Z510 Encounter for antineoplastic radiation therapy: Secondary | ICD-10-CM | POA: Diagnosis not present

## 2019-08-12 NOTE — Progress Notes (Signed)
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  Patient receiving concurrent chemotherapy and radiation.   Met with patient's wife.  Wife reports that patient has had a good appetite over the last week.  Reports that he has been eating chicken, green beans, potatoes last night for dinner.  Has been drinking about 4 ensure enlive daily.  Has been eating ice cream. Lunch is usually peanut butter and jelly sandwich.  Breakfast is eggs with toast and ensure and applesauce.  Wife reports that patient has been using small arm weights and trying to walk around house and yard for exercise.  Patient is taking megace.      Medications: megace  Labs: reviewed  Anthropometrics:   Weight 108 lb on 6/16  109 last week 112 lb on 5/24 111 lb on 5/5  NUTRITION DIAGNOSIS: Inadequate oral intake continues   INTERVENTION:  Wife has been preparing foods with additional calories and protein (adding gravy, butter, mayo, etc) Patient to continue to drink 4 ensure enlive daily (1400 calories and 80 g protein) Patient and wife have contact information    MONITORING, EVALUATION, GOAL: weight trends, intake   NEXT VISIT: June 24 following radiation  Zylee Marchiano B. Zenia Resides, [place], [place] Registered Dietitian [phone] (pager)

## 2019-08-13 ENCOUNTER — Inpatient Hospital Stay: Payer: Medicare Other

## 2019-08-13 ENCOUNTER — Ambulatory Visit
Admission: RE | Admit: 2019-08-13 | Discharge: 2019-08-13 | Disposition: A | Discharge status: Home / Self Care | Payer: Medicare Other | Source: Ambulatory Visit | Attending: Radiation Oncology | Admitting: Radiation Oncology

## 2019-08-13 DIAGNOSIS — Z510 Encounter for antineoplastic radiation therapy: Secondary | ICD-10-CM | POA: Diagnosis not present

## 2019-08-16 ENCOUNTER — Inpatient Hospital Stay: Payer: Medicare Other

## 2019-08-16 ENCOUNTER — Ambulatory Visit
Admission: RE | Admit: 2019-08-16 | Discharge: 2019-08-16 | Disposition: A | Discharge status: Home / Self Care | Payer: Medicare Other | Source: Ambulatory Visit | Attending: Radiation Oncology | Admitting: Radiation Oncology

## 2019-08-16 DIAGNOSIS — Z510 Encounter for antineoplastic radiation therapy: Secondary | ICD-10-CM | POA: Diagnosis not present

## 2019-08-17 ENCOUNTER — Inpatient Hospital Stay: Payer: Medicare Other

## 2019-08-17 ENCOUNTER — Ambulatory Visit
Admission: RE | Admit: 2019-08-17 | Discharge: 2019-08-17 | Disposition: A | Discharge status: Home / Self Care | Payer: Medicare Other | Source: Ambulatory Visit | Attending: Radiation Oncology | Admitting: Radiation Oncology

## 2019-08-17 DIAGNOSIS — Z510 Encounter for antineoplastic radiation therapy: Secondary | ICD-10-CM | POA: Diagnosis not present

## 2019-08-18 ENCOUNTER — Inpatient Hospital Stay: Payer: Medicare Other

## 2019-08-18 ENCOUNTER — Other Ambulatory Visit: Payer: Self-pay

## 2019-08-18 ENCOUNTER — Encounter: Payer: Self-pay | Admitting: Oncology

## 2019-08-18 ENCOUNTER — Ambulatory Visit
Admission: RE | Admit: 2019-08-18 | Discharge: 2019-08-18 | Disposition: A | Discharge status: Home / Self Care | Payer: Medicare Other | Source: Ambulatory Visit | Attending: Radiation Oncology | Admitting: Radiation Oncology

## 2019-08-18 ENCOUNTER — Inpatient Hospital Stay (HOSPITAL_BASED_OUTPATIENT_CLINIC_OR_DEPARTMENT_OTHER): Payer: Medicare Other | Admitting: Oncology

## 2019-08-18 VITALS — BP 126/72 | HR 74 | Temp 97.9°F | Resp 18 | Wt 106.2 lb

## 2019-08-18 DIAGNOSIS — C109 Malignant neoplasm of oropharynx, unspecified: Secondary | ICD-10-CM

## 2019-08-18 DIAGNOSIS — K1230 Oral mucositis (ulcerative), unspecified: Secondary | ICD-10-CM

## 2019-08-18 DIAGNOSIS — Z5111 Encounter for antineoplastic chemotherapy: Secondary | ICD-10-CM

## 2019-08-18 DIAGNOSIS — Z510 Encounter for antineoplastic radiation therapy: Secondary | ICD-10-CM | POA: Diagnosis not present

## 2019-08-18 DIAGNOSIS — C3492 Malignant neoplasm of unspecified part of left bronchus or lung: Secondary | ICD-10-CM

## 2019-08-18 DIAGNOSIS — F1010 Alcohol abuse, uncomplicated: Secondary | ICD-10-CM

## 2019-08-18 DIAGNOSIS — Z7189 Other specified counseling: Secondary | ICD-10-CM

## 2019-08-18 DIAGNOSIS — Z95828 Presence of other vascular implants and grafts: Secondary | ICD-10-CM

## 2019-08-18 DIAGNOSIS — R112 Nausea with vomiting, unspecified: Secondary | ICD-10-CM | POA: Diagnosis not present

## 2019-08-18 LAB — CBC WITH DIFFERENTIAL/PLATELET
Abs Immature Granulocytes: 0.01 10*3/uL (ref 0.00–0.07)
Basophils Absolute: 0 10*3/uL (ref 0.0–0.1)
Basophils Relative: 0 %
Eosinophils Absolute: 0 10*3/uL (ref 0.0–0.5)
Eosinophils Relative: 0 %
HCT: 28.6 % — ABNORMAL LOW (ref 39.0–52.0)
Hemoglobin: 9.6 g/dL — ABNORMAL LOW (ref 13.0–17.0)
Immature Granulocytes: 0 %
Lymphocytes Relative: 6 %
Lymphs Abs: 0.2 10*3/uL — ABNORMAL LOW (ref 0.7–4.0)
MCH: 29.8 pg (ref 26.0–34.0)
MCHC: 33.6 g/dL (ref 30.0–36.0)
MCV: 88.8 fL (ref 80.0–100.0)
Monocytes Absolute: 0.2 10*3/uL (ref 0.1–1.0)
Monocytes Relative: 7 %
Neutro Abs: 3 10*3/uL (ref 1.7–7.7)
Neutrophils Relative %: 87 %
Platelets: 114 10*3/uL — ABNORMAL LOW (ref 150–400)
RBC: 3.22 MIL/uL — ABNORMAL LOW (ref 4.22–5.81)
RDW: 12.3 % (ref 11.5–15.5)
WBC: 3.4 10*3/uL — ABNORMAL LOW (ref 4.0–10.5)
nRBC: 0 % (ref 0.0–0.2)

## 2019-08-18 LAB — COMPREHENSIVE METABOLIC PANEL
ALT: 22 U/L (ref 0–44)
AST: 18 U/L (ref 15–41)
Albumin: 3.2 g/dL — ABNORMAL LOW (ref 3.5–5.0)
Alkaline Phosphatase: 55 U/L (ref 38–126)
Anion gap: 9 (ref 5–15)
BUN: 29 mg/dL — ABNORMAL HIGH (ref 8–23)
CO2: 25 mmol/L (ref 22–32)
Calcium: 8.7 mg/dL — ABNORMAL LOW (ref 8.9–10.3)
Chloride: 108 mmol/L (ref 98–111)
Creatinine, Ser: 1.02 mg/dL (ref 0.61–1.24)
GFR calc Af Amer: >60 mL/min (ref >60)
GFR calc non Af Amer: >60 mL/min (ref >60)
Glucose, Bld: 141 mg/dL — ABNORMAL HIGH (ref 70–99)
Potassium: 3.7 mmol/L (ref 3.5–5.1)
Sodium: 142 mmol/L (ref 135–145)
Total Bilirubin: 0.9 mg/dL (ref 0.3–1.2)
Total Protein: 6.8 g/dL (ref 6.5–8.1)

## 2019-08-18 LAB — MAGNESIUM: Magnesium: 1.3 mg/dL — ABNORMAL LOW (ref 1.7–2.4)

## 2019-08-18 MED ORDER — POTASSIUM CHLORIDE 2 MEQ/ML IV SOLN
Freq: Once | INTRAVENOUS | Status: AC
  Filled 2019-08-18: qty 1000

## 2019-08-18 MED ORDER — MAGNESIUM SULFATE 4 GM/100ML IV SOLN
4.0000 g | Freq: Once | INTRAVENOUS | Status: AC
  Administered 2019-08-18: 4 g via INTRAVENOUS
  Filled 2019-08-18: qty 100

## 2019-08-18 MED ORDER — SODIUM CHLORIDE 0.9 % IV SOLN
40.0000 mg/m2 | Freq: Once | INTRAVENOUS | Status: AC
  Administered 2019-08-18: 62 mg via INTRAVENOUS
  Filled 2019-08-18: qty 62

## 2019-08-18 MED ORDER — LIDOCAINE VISCOUS HCL 2 % MT SOLN
15.0000 mL | OROMUCOSAL | 2 refills | Status: DC | PRN
Start: 2019-08-18 — End: 2019-09-08

## 2019-08-18 MED ORDER — HEPARIN SOD (PORK) LOCK FLUSH 100 UNIT/ML IV SOLN
500.0000 [IU] | Freq: Once | INTRAVENOUS | Status: AC | PRN
  Administered 2019-08-18: 500 [IU]
  Filled 2019-08-18: qty 5

## 2019-08-18 MED ORDER — HEPARIN SOD (PORK) LOCK FLUSH 100 UNIT/ML IV SOLN
INTRAVENOUS | Status: AC
  Filled 2019-08-18: qty 5

## 2019-08-18 MED ORDER — MAGNESIUM CHLORIDE 64 MG PO TBEC: 1.0000 | DELAYED_RELEASE_TABLET | Freq: Every day | ORAL | 1 refills | Status: DC

## 2019-08-18 MED ORDER — SODIUM CHLORIDE 0.9 % IV SOLN
Freq: Once | INTRAVENOUS | Status: AC
  Filled 2019-08-18: qty 250

## 2019-08-18 MED ORDER — SODIUM CHLORIDE 0.9% FLUSH
10.0000 mL | INTRAVENOUS | Status: DC | PRN
  Administered 2019-08-18: 10 mL via INTRAVENOUS
  Filled 2019-08-18: qty 10

## 2019-08-18 MED ORDER — PALONOSETRON HCL INJECTION 0.25 MG/5ML
0.2500 mg | Freq: Once | INTRAVENOUS | Status: AC
  Administered 2019-08-18: 0.25 mg via INTRAVENOUS
  Filled 2019-08-18: qty 5

## 2019-08-18 MED ORDER — SODIUM CHLORIDE 0.9 % IV SOLN
10.0000 mg | Freq: Once | INTRAVENOUS | Status: AC
  Administered 2019-08-18: 10 mg via INTRAVENOUS
  Filled 2019-08-18: qty 10

## 2019-08-18 MED ORDER — SODIUM CHLORIDE 0.9 % IV SOLN
150.0000 mg | Freq: Once | INTRAVENOUS | Status: AC
  Administered 2019-08-18: 150 mg via INTRAVENOUS
  Filled 2019-08-18: qty 5

## 2019-08-18 NOTE — Progress Notes (Signed)
Patient here for follow up. Pt reports hair loss, weakness and sore throat (due to radiation).

## 2019-08-18 NOTE — Progress Notes (Signed)
Hematology/Oncology progress note [facility] [phone] [phone]   Patient Care Team: Casilda Carls, MD as PCP - General (Internal Medicine) Earlie Server, [name], [name]  HISTORY OF PRESENTING ILLNESS:  Gregory Burton is a  79 y.o.  male with PMH listed below was seen in consultation at the request of  Casilda Carls, MD  for evaluation of lymph node.  Patient moved from [place] to [place] for about 3 weeks. He has noticed a neck knot on the right side of his neck. The knot is sore and makes him to cough and he feels it when he swallows. No swallowing difficulty.  Patient was accompanied by his wife. She reports that patient's previous PCP has done neck sonogram for evaluation.  Patient also had right lower molar tooth extraction done a few weeks before he noticed  He also took a course of antibiotics.  Due to his tooth pain, his appetite has decreased and he has lost weight loss, about 20 pounds, he can not specify the time frame.   Alcoholism History of prostate cancer. S/p Radiation/seed, he follows up with [facility].   INTERVAL HISTORY Gregory Burton is a 79 y.o. male who has above history reviewed by me today presents for evaluation prior to chemotherapy.  Patient has been on concurrent chemotherapy and radiation with cisplatin.   Patient reports sore throat and mouth pain.  Is still able to eat soft food.  Appetite is fair. Denies any nausea, vomiting, diarrhea.  He feels tired  Review of Systems  Constitutional: Positive for fatigue. Negative for appetite change, chills, fever and unexpected weight change.  HENT:   Negative for hearing loss and voice change.        Sore throat  Eyes: Negative for eye problems and icterus.  Respiratory: Negative for chest tightness, cough and shortness of  breath.   Cardiovascular: Negative for chest pain and leg swelling.  Gastrointestinal: Negative for abdominal distention, abdominal pain, blood in stool and nausea.  Endocrine: Negative for hot flashes.  Genitourinary: Negative for difficulty urinating, dysuria and frequency.   Musculoskeletal: Negative for arthralgias.  Skin: Negative for itching and rash.  Neurological: Negative for extremity weakness, light-headedness and numbness.  Hematological: Negative for adenopathy. Does not bruise/bleed easily.  Psychiatric/Behavioral: Negative for confusion.    MEDICAL HISTORY:  Past Medical History:  Diagnosis Date  . Alcohol abuse   . Blood transfusion without reported diagnosis 2013  . Cataract   . Dementia ([place])   . Glaucoma   . Gout   . Hypercholesteremia   . Hypertension   . Prostate CA ([place]) 2008  . 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 BRAIN SURGERY  2012  . HERNIA REPAIR    . PORTA CATH INSERTION N/A 07/27/2019   Procedure: PORTA CATH INSERTION;  Surgeon: Katha Cabal, MD;  Location: [facility];  Service: Cardiovascular;  Laterality: N/A;    SOCIAL HISTORY: Social History   Socioeconomic History  . Marital status: Married    Spouse name: Not on file  . Number of children: Not on file  . Years of education: Not on file  . Highest education level: Not on file  Occupational History  . Not on file  Tobacco Use  . Smoking status: Former Smoker  Years: 21.00    Types: Cigarettes    Quit date: 05/11/2008    Years since quitting: 11.2  . Smokeless tobacco: Never Used  Vaping Use  . Vaping Use: Never used  Substance and Sexual Activity  . Alcohol use: Yes    Comment: 0.5 pint liquor a week  . Drug use: Never  . Sexual activity: Not Currently  Other Topics Concern  . Not on file  Social History Narrative   Lives at home with wife. Wife states he has some dementia but  is able to sign his own consent.   Social Determinants of Health   Financial Resource Strain:   . Difficulty of Paying Living Expenses:   Food Insecurity:   . Worried About Charity fundraiser in the Last Year:   . Arboriculturist in the Last Year:   Transportation Needs:   . Film/video editor (Medical):   Marland Kitchen Lack of Transportation (Non-Medical):   Physical Activity:   . Days of Exercise per Week:   . Minutes of Exercise per Session:   Stress:   . Feeling of Stress :   Social Connections:   . Frequency of Communication with Friends and Family:   . Frequency of Social Gatherings with Friends and Family:   . Attends Religious Services:   . Active Member of Clubs or Organizations:   . Attends Archivist Meetings:   Marland Kitchen Marital Status:   Intimate Partner Violence:   . Fear of Current or Ex-Partner:   . Emotionally Abused:   Marland Kitchen Physically Abused:   . Sexually Abused:     FAMILY HISTORY: No family history on file.  ALLERGIES:  is allergic to enalapril.  MEDICATIONS:  Current Outpatient Medications  Medication Sig Dispense Refill  . atorvastatin (LIPITOR) 20 MG tablet Take 20 mg by mouth daily.    Marland Kitchen dexamethasone (DECADRON) 4 MG tablet Take 2 tablets (8 mg total) by mouth See admin instructions. Take 2 tablets daily for 2 days after each chemotherapy 16 tablet 0  . donepezil (ARICEPT) 10 MG tablet Take 10 mg by mouth at bedtime.    . dorzolamide-timolol (COSOPT) 22.3-6.8 MG/ML ophthalmic solution Place 1 drop into both eyes 2 (two) times daily.     . folic acid (FOLVITE) 1 MG tablet Take 1 mg by mouth daily.    Marland Kitchen latanoprost (XALATAN) 0.005 % ophthalmic solution Place 1 drop into both eyes at bedtime.     . lidocaine-prilocaine (EMLA) cream Apply 1 application topically as needed. Apply small amount to port site approx 1-2 hours prior to appointment. 30 g 1  . megestrol (MEGACE) 40 MG tablet Take 1 tablet (40 mg total) by mouth 2 (two) times daily. 60 tablet 0  .  nystatin (MYCOSTATIN) 100000 UNIT/ML suspension Take 5 mLs (500,000 Units total) by mouth 4 (four) times daily. 473 mL 0  . prednisoLONE 5 MG TABS tablet Take 5 mg by mouth daily as needed (Gout).    . predniSONE (DELTASONE) 5 MG tablet Take 5 mg by mouth daily with breakfast. As needed for gout flare    . prochlorperazine (COMPAZINE) 10 MG tablet Take 1 tablet (10 mg total) by mouth every 6 (six) hours as needed (Nausea or vomiting). 30 tablet 1  . Thiamine HCl (VITAMIN B-1 PO) Take 100 mg by mouth daily.      No current facility-administered medications for this visit.     PHYSICAL EXAMINATION: ECOG PERFORMANCE STATUS: 1 - Symptomatic but completely  ambulatory Vitals:   08/18/19 0854  BP: 126/72  Pulse: 74  Resp: 18  Temp: 97.9 F (36.6 C)   Filed Weights   08/18/19 0854  Weight: 106 lb 3.2 oz (48.2 kg)    Physical Exam Constitutional:      General: He is not in acute distress. HENT:     Head: Normocephalic and atraumatic.     Mouth/Throat:     Comments: Ritta Slot has resolved Eyes:     General: No scleral icterus. Neck:     Comments: Palpable 1-2cm right neck mass  Cardiovascular:     Rate and Rhythm: Normal rate and regular rhythm.     Heart sounds: Normal heart sounds.  Pulmonary:     Effort: Pulmonary effort is normal. No respiratory distress.     Breath sounds: No wheezing.  Abdominal:     General: Bowel sounds are normal. There is no distension.     Palpations: Abdomen is soft.  Musculoskeletal:        General: No deformity. Normal range of motion.     Cervical back: Normal range of motion and neck supple.  Skin:    General: Skin is warm and dry.     Findings: No erythema or rash.  Neurological:     Mental Status: He is alert and oriented to person, place, and time. Mental status is at baseline.     Cranial Nerves: No cranial nerve deficit.     Coordination: Coordination normal.  Psychiatric:        Mood and Affect: Mood normal.     LABORATORY DATA:    I have reviewed the data as listed Lab Results  Component Value Date   WBC 4.3 08/11/2019   HGB 9.8 (L) 08/11/2019   HCT 30.4 (L) 08/11/2019   MCV 90.5 08/11/2019   PLT 161 08/11/2019   Recent Labs    07/28/19 0830 08/04/19 0826 08/11/19 0807  NA 137 138 141  K 3.9 3.8 3.8  CL 100 105 107  CO2 29 24 24   GLUCOSE 103* 99 108*  BUN 15 21 30*  CREATININE 0.76 0.80 0.88  CALCIUM 9.2 8.9 8.6*  GFRNONAA >60 >60 >60  GFRAA >60 >60 >60  PROT 7.5 7.3 6.9  ALBUMIN 3.6 3.5 3.2*  AST 18 19 16   ALT 17 19 19   ALKPHOS 58 55 54  BILITOT 0.8 0.9 0.8   Iron/TIBC/Ferritin/ %Sat No results found for: IRON, TIBC, FERRITIN, IRONPCTSAT    RADIOGRAPHIC STUDIES: I have personally reviewed the radiological images as listed and agreed with the findings in the report. PERIPHERAL VASCULAR CATHETERIZATION  Result Date: 07/27/2019 See op note CT SOFT TISSUE NECK W CONTRAST  Result Date: 05/27/2019 CLINICAL DATA:  Right neck mass EXAM: CT NECK WITH CONTRAST TECHNIQUE: Multidetector CT imaging of the neck was performed using the standard protocol following the bolus administration of intravenous contrast. CONTRAST:  37mL OMNIPAQUE IOHEXOL 300 MG/ML  SOLN COMPARISON:  None. FINDINGS: Pharynx and larynx: Enhancing mass in the right piriform sinus measuring 17 mm in diameter. Appearance is concerning for neoplasm. Possible extension into the base of the epiglottis on the right. Base of tongue normal. No airway compromise. Larynx normal. Salivary glands: No inflammation, mass, or stone. Thyroid: Negative Lymph nodes: Cystic mass in the right lateral neck at the level of the piriform sinus. Cyst measures 15 x 24 mm and is deep to the sternocleidomastoid muscle. There is mild enhancement of the wall the cyst which is likely a necrotic  lymph node. Cystic mass in the retropharyngeal soft tissues on the right in the region of the nasopharynx. This measures approximately 18 mm in diameter and likely is a necrotic  lymph node. Cluster of lymph nodes in the right posterior neck measuring up to 12 mm in diameter. These are also likely malignant lymph nodes. Vascular: Normal vascular enhancement. Atherosclerotic calcification is present in the aortic arch and carotid bifurcation. 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 Posterior lymph node on the right appears to extend into the right jugular vein without occlusion. Limited intracranial: Negative Visualized orbits: Negative Mastoids and visualized paranasal sinuses: Mild mucosal edema in the ethmoid sinuses. Remaining sinuses clear. Mastoid clear bilaterally. Skeleton: Cervical spondylosis. Thoracic scoliosis. No acute skeletal abnormality. Right lower molar extraction site without complication. Upper chest: 10 mm spiculated nodule left upper lobe with central necrosis. Right apex clear. Other: None IMPRESSION: 1. Solid enhancing mass in the right piriform sinus measuring 17 mm, highly suspicious for carcinoma. 2. Multiple lymph nodes in the right neck including cystic nodes in the right posterior nasopharynx and right lateral neck. Posterior lymph nodes in the right neck also likely malignant, 1 of which extends into the right jugular vein. Tissue sampling and direct laryngoscopy recommended to evaluate for neoplasm. 3. 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 PET-CT would be helpful for further evaluation and staging. 4. Atherosclerotic disease. 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 This appears to be a flow limiting lesion with decreased caliber of the right internal carotid artery which is patent. Electronically Signed   By: Franchot Gallo M.D.   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 A total of Versed 1.0 mg and Fentanyl 50 mcg was administered intravenously. Moderate Sedation Time: 12 minutes. The patient's level of consciousness and vital signs were monitored continuously by radiology nursing throughout the procedure under my direct supervision. FLUOROSCOPY TIME:  Fluoroscopy Time: None. COMPLICATIONS: None immediate. PROCEDURE: Informed written consent was obtained from the patient after a thorough discussion of the procedural risks, benefits and alternatives. All questions were addressed. Maximal Sterile Barrier Technique was utilized including caps, mask, sterile gowns, sterile gloves, sterile drape, hand hygiene and skin antiseptic. A timeout was performed prior to the initiation of the procedure. Previous imaging reviewed. Preliminary ultrasound performed. The right cervical abnormal adenopathy was localized and marked. Ultrasound aspiration: Under sterile conditions and local anesthesia, an 18 gauge needle was advanced into the cystic necrotic portion of the adenopathy. Syringe aspiration yielded 2 cc exudative fluid. Sample sent for cytology. Needle removed. Ultrasound core biopsy: Under sterile conditions and local anesthesia, an 18 gauge core biopsy needle was advanced to the residual component of the right cervical adenopathy. 18 gauge core biopsies obtained. These were placed in formalin. Needle removed. Postprocedure imaging demonstrates no hemorrhage or hematoma. Patient tolerated biopsy well. IMPRESSION: Successful ultrasound aspiration for cytology and core biopsy for pathology of the right neck necrotic cervical adenopathy.  Electronically Signed   By: Jerilynn Mages.  Shick M.D.  On: 06/11/2019 13:10   PERIPHERAL VASCULAR CATHETERIZATION  Result Date: 07/27/2019 See op note  NM PET Image Initial (PI) Skull Base To Thigh  Result Date: 06/07/2019 CLINICAL DATA:  Initial treatment strategy for neck mass. EXAM: NUCLEAR MEDICINE PET SKULL BASE TO THIGH TECHNIQUE: 6.57 mCi F-18 FDG was injected intravenously. Full-ring PET imaging was performed from the skull base to thigh after the radiotracer. CT data was obtained and used for attenuation correction and anatomic localization. Fasting blood glucose: The 56 mg/dl COMPARISON:  CT 05/27/2019 FINDINGS: Mediastinal blood pool activity: SUV max 2.39 Liver activity: SUV max NA NECK: Base of tongue mass extending into the right peer form sinus is identified. This has an SUV max of 12.69. Multiple right-sided FDG avid lymph nodes are identified. Index right level 2 node within the measures 2.5 cm within SUV max of 5.7. Right level 2 node posterior and medial to the parotid gland measures 2.2 cm within SUV max of 9.6. Right FDG avid node at the level of the piriform sinus measures 2.2 cm within SUV max of 5.8. Incidental CT findings: none CHEST: No FDG avid axillary, supraclavicular, mediastinal, or hilar lymph nodes. Within the posterolateral left upper lobe there is a nodule with central cavitary component measuring 1 cm. This has an SUV max of 1.8, image 89/3. Incidental CT findings: A few scattered calcified pleural plaques are noted suggesting previous asbestos exposure. Aortic atherosclerosis. Three vessel coronary artery atherosclerotic calcifications. ABDOMEN/PELVIS: No abnormal hypermetabolic activity within the liver, pancreas, adrenal glands, or spleen. No hypermetabolic lymph nodes in the abdomen or pelvis. Incidental CT findings: Aortic atherosclerosis. Seed implants noted within the prostate gland. SKELETON: No focal hypermetabolic activity to suggest skeletal metastasis. Incidental CT  findings: none IMPRESSION: 1. 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 2. Multiple FDG avid cervical lymph nodes compatible with ipsilateral nodal metastasis. No FDG avid left cervical lymph nodes identified. 3. Left upper lobe pulmonary nodule exhibits mild FDG uptake within SUV max of 1.8. Cannot rule out pulmonary metastasis. 4. 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 No findings of pulmonary asbestosis. 5. Coronary artery calcifications 6.  Aortic Atherosclerosis (ICD10-I70.0). Electronically Signed   By: Kerby Moors M.D.   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 Staging imaging as also demonstrated a roughly 1 cm cavitary nodule in the left upper lobe. The patient presents for biopsy to determine whether this represents metastatic disease, primary lung carcinoma or benign nodule. EXAM: CT GUIDED CORE BIOPSY OF LEFT UPPER LOBE LUNG NODULE ANESTHESIA/SEDATION: 2.0 mg IV Versed; 100 mcg IV Fentanyl Total Moderate Sedation Time:  22 minutes. The patient's level of consciousness and physiologic status were continuously monitored during the procedure by Radiology nursing. PROCEDURE: The procedure risks, benefits, and alternatives were explained to the patient and his wife. Questions regarding the procedure were encouraged and answered. The patient and his wife understand and consent to the procedure. A time-out was performed prior to initiating the procedure. Supine imaging was performed through the upper to mid chest. The left upper chest wall was prepped with chlorhexidine in a sterile fashion, and a sterile drape was applied covering the operative field. A sterile gown and sterile gloves were used for the procedure. Local anesthesia was provided with 1% Lidocaine. Under CT guidance, a 17 gauge  trocar needle was advanced to  the level of a left upper lobe pulmonary nodule. After confirming needle tip position, 2 separate coaxial 18 gauge core biopsy samples were obtained. Additional imaging was performed. Aspiration was then performed via the outer needle at the level of the pleural space. Additional CT was performed after needle removal. COMPLICATIONS: Tiny left lateral pneumothorax. SIR level B: Nominal therapy (including overnight admission for observation), no consequence. FINDINGS: Small lateral and posterior left upper lobe nodule again demonstrated which measures approximately 9 x 12 mm. This nodule demonstrates central cavitation. One small tissue fragment and a second more intact core biopsy sample were able to be obtained. During the procedure, a small left lateral pneumothorax was sustained. This was treated with aspiration via the outer needle upon retraction resulting in complete aspiration and decompression of the pneumothorax. IMPRESSION: CT-guided core biopsy of cavitary left upper lobe lung nodule measuring approximately 9 x 12 mm by CT today. The procedure was complicated by a tiny lateral pneumothorax treated by needle aspiration which resulted in complete evacuation. A follow-up chest x-ray will be performed during recovery. Electronically Signed   By: Aletta Edouard M.D.   On: 06/29/2019 11:17   DG Chest Port 1 View  Result Date: 06/29/2019 CLINICAL DATA:  Status post percutaneous biopsy of left upper lobe lung nodule with aspiration small left pneumothorax at the time of the procedure. EXAM: PORTABLE CHEST 1 VIEW COMPARISON:  Imaging during CT-guided lung biopsy earlier today FINDINGS: The heart size and mediastinal contours are within normal limits. No pneumothorax identified. Small peripheral left upper lung opacity corresponds to the nodule sampled earlier today. No evidence of pleural fluid, consolidation or edema. The visualized skeletal structures are unremarkable.  IMPRESSION: No pneumothorax after left upper lobe lung biopsy. Electronically Signed   By: Aletta Edouard M.D.   On: 06/29/2019 13:07   [place] CORE BIOPSY (LYMPH NODES)  Result Date: 06/11/2019 INDICATION: PET positive right cervical necrotic adenopathy, concern for head neck squamous cell carcinoma EXAM: Ultrasound aspiration of the right cervical necrotic adenopathy for cytology Ultrasound core biopsy of the right cervical necrotic adenopathy for pathology MEDICATIONS: 1% lidocaine local ANESTHESIA/SEDATION: Moderate (conscious) sedation was employed during this procedure. A total of Versed 1.0 mg and Fentanyl 50 mcg was administered intravenously. Moderate Sedation Time: 12 minutes. The patient's level of consciousness and vital signs were monitored continuously by radiology nursing throughout the procedure under my direct supervision. FLUOROSCOPY TIME:  Fluoroscopy Time: None. COMPLICATIONS: None immediate. PROCEDURE: Informed written consent was obtained from the patient after a thorough discussion of the procedural risks, benefits and alternatives. All questions were addressed. Maximal Sterile Barrier Technique was utilized including caps, mask, sterile gowns, sterile gloves, sterile drape, hand hygiene and skin antiseptic. A timeout was performed prior to the initiation of the procedure. Previous imaging reviewed. Preliminary ultrasound performed. The right cervical abnormal adenopathy was localized and marked. Ultrasound aspiration: Under sterile conditions and local anesthesia, an 18 gauge needle was advanced into the cystic necrotic portion of the adenopathy. Syringe aspiration yielded 2 cc exudative fluid. Sample sent for cytology. Needle removed. Ultrasound core biopsy: Under sterile conditions and local anesthesia, an 18 gauge core biopsy needle was advanced to the residual component of the right cervical adenopathy. 18 gauge core biopsies obtained. These were placed in formalin. Needle removed.  Postprocedure imaging demonstrates no hemorrhage or hematoma. Patient tolerated biopsy well. IMPRESSION: Successful ultrasound aspiration for cytology and core biopsy for pathology of the right neck necrotic cervical adenopathy. Electronically Signed   By: Jerilynn Mages.  Shick M.D.   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 Non-intractable vomiting with nausea, unspecified vomiting type   4. Encounter for antineoplastic chemotherapy   5. Mucositis   6. 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   labs are reviewed and discussed with patient C .  Proceed with cisplatin 40 mg/m today  #Hypomagnesium, magnesium level is 1.3.  Patient will receive IV magnesium sulfate 4 g today.  Recommend slow mag 1 tablet daily.  Prescription was sent to pharmacy. #Nausea, symptoms are better.  Continue Compazine Q6h as needed.  Dexamethasone 8mg  daily for 2 days after chemo.  #Sore throat, likely secondary to radiation.  I will add viscous lidocaine swish and spit. # Weight loss, weight has been stable.  Continue Megace 40 mg twice daily. # Chronic alcohol use, per patient he has stopped drinking alcohol. #Lung lesion biopsy showed squamous cell carcinoma.-He will proceed with SBRT in the future We discussed about feeding tube placement prior to the  treatments All questions were answered. The patient knows to call the clinic with any problems questions or concerns.   Return of visit:  1 week   Earlie Server, MD, PhD Hematology Oncology [facility] Pager- [phone] 08/18/2019

## 2019-08-19 ENCOUNTER — Ambulatory Visit
Admission: RE | Admit: 2019-08-19 | Discharge: 2019-08-19 | Disposition: A | Discharge status: Home / Self Care | Payer: Medicare Other | Source: Ambulatory Visit | Attending: Radiation Oncology | Admitting: Radiation Oncology

## 2019-08-19 ENCOUNTER — Other Ambulatory Visit: Payer: Self-pay | Admitting: Oncology

## 2019-08-19 ENCOUNTER — Inpatient Hospital Stay: Payer: Medicare Other

## 2019-08-19 DIAGNOSIS — Z510 Encounter for antineoplastic radiation therapy: Secondary | ICD-10-CM | POA: Diagnosis not present

## 2019-08-19 NOTE — Progress Notes (Signed)
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  Patient receiving concurrent chemotherapy and radiation therapy.    Met with patient and wife following radiation.  Patient reports some soreness in throat (lidocaine started yesterday).  Wife reports appetite is about the same.  Drinking about 3-4 ensure enlive per day.  Patient has been eating egg and cheese toast with ensure and juice.  Last night ate pork chop, potatoes, lima beans.  Eating yogurt and watermelon.     Medications: megace, lidocaine, magnesium  Labs: reviewed  Anthropometrics:   Weight 106 lb 3.2 oz on 6/23 decreased from 108 lb on 6/16 109 lb on 6/9 112 lb on 5/24 111 lb on 5/5   NUTRITION DIAGNOSIS: Inadequate oral intake continues   INTERVENTION:  Discussed with wife how to increase calories and protein in cooking.   Encouraged patient to drink 4 ensure enlive consistently daily plus eat solid foods Case of ensure enlive given to patient today. Wife has contact information    MONITORING, EVALUATION, GOAL: weight trends, intake   NEXT VISIT: July 1 after radiation  Gregory Burton B. Zenia Resides, [place], [place] Registered Dietitian [phone] (pager)

## 2019-08-20 ENCOUNTER — Inpatient Hospital Stay: Payer: Medicare Other

## 2019-08-20 ENCOUNTER — Ambulatory Visit
Admission: RE | Admit: 2019-08-20 | Discharge: 2019-08-20 | Disposition: A | Discharge status: Home / Self Care | Payer: Medicare Other | Source: Ambulatory Visit | Attending: Radiation Oncology | Admitting: Radiation Oncology

## 2019-08-20 DIAGNOSIS — Z510 Encounter for antineoplastic radiation therapy: Secondary | ICD-10-CM | POA: Diagnosis not present

## 2019-08-23 ENCOUNTER — Inpatient Hospital Stay: Payer: Medicare Other

## 2019-08-23 ENCOUNTER — Ambulatory Visit
Admission: RE | Admit: 2019-08-23 | Discharge: 2019-08-23 | Disposition: A | Discharge status: Home / Self Care | Payer: Medicare Other | Source: Ambulatory Visit | Attending: Radiation Oncology | Admitting: Radiation Oncology

## 2019-08-23 DIAGNOSIS — Z510 Encounter for antineoplastic radiation therapy: Secondary | ICD-10-CM | POA: Diagnosis not present

## 2019-08-24 ENCOUNTER — Ambulatory Visit
Admission: RE | Admit: 2019-08-24 | Discharge: 2019-08-24 | Disposition: A | Discharge status: Home / Self Care | Payer: Medicare Other | Source: Ambulatory Visit | Attending: Radiation Oncology | Admitting: Radiation Oncology

## 2019-08-24 ENCOUNTER — Inpatient Hospital Stay: Payer: Medicare Other

## 2019-08-24 DIAGNOSIS — Z510 Encounter for antineoplastic radiation therapy: Secondary | ICD-10-CM | POA: Diagnosis not present

## 2019-08-25 ENCOUNTER — Inpatient Hospital Stay: Payer: Medicare Other

## 2019-08-25 ENCOUNTER — Ambulatory Visit
Admission: RE | Admit: 2019-08-25 | Discharge: 2019-08-25 | Disposition: A | Discharge status: Home / Self Care | Payer: Medicare Other | Source: Ambulatory Visit | Attending: Radiation Oncology | Admitting: Radiation Oncology

## 2019-08-25 ENCOUNTER — Other Ambulatory Visit: Payer: Self-pay

## 2019-08-25 ENCOUNTER — Inpatient Hospital Stay (HOSPITAL_BASED_OUTPATIENT_CLINIC_OR_DEPARTMENT_OTHER): Payer: Medicare Other | Admitting: Oncology

## 2019-08-25 ENCOUNTER — Encounter: Payer: Self-pay | Admitting: Oncology

## 2019-08-25 VITALS — BP 138/93 | HR 77 | Temp 96.7°F | Resp 20 | Wt 103.6 lb

## 2019-08-25 DIAGNOSIS — C109 Malignant neoplasm of oropharynx, unspecified: Secondary | ICD-10-CM

## 2019-08-25 DIAGNOSIS — C3492 Malignant neoplasm of unspecified part of left bronchus or lung: Secondary | ICD-10-CM

## 2019-08-25 DIAGNOSIS — R112 Nausea with vomiting, unspecified: Secondary | ICD-10-CM

## 2019-08-25 DIAGNOSIS — Z5111 Encounter for antineoplastic chemotherapy: Secondary | ICD-10-CM | POA: Diagnosis not present

## 2019-08-25 DIAGNOSIS — R634 Abnormal weight loss: Secondary | ICD-10-CM

## 2019-08-25 DIAGNOSIS — K1230 Oral mucositis (ulcerative), unspecified: Secondary | ICD-10-CM

## 2019-08-25 DIAGNOSIS — Z7189 Other specified counseling: Secondary | ICD-10-CM

## 2019-08-25 DIAGNOSIS — Z510 Encounter for antineoplastic radiation therapy: Secondary | ICD-10-CM | POA: Diagnosis not present

## 2019-08-25 DIAGNOSIS — F1010 Alcohol abuse, uncomplicated: Secondary | ICD-10-CM

## 2019-08-25 LAB — CBC WITH DIFFERENTIAL/PLATELET
Abs Immature Granulocytes: 0.01 10*3/uL (ref 0.00–0.07)
Basophils Absolute: 0 10*3/uL (ref 0.0–0.1)
Basophils Relative: 0 %
Eosinophils Absolute: 0 10*3/uL (ref 0.0–0.5)
Eosinophils Relative: 1 %
HCT: 26.1 % — ABNORMAL LOW (ref 39.0–52.0)
Hemoglobin: 8.8 g/dL — ABNORMAL LOW (ref 13.0–17.0)
Immature Granulocytes: 1 %
Lymphocytes Relative: 8 %
Lymphs Abs: 0.1 10*3/uL — ABNORMAL LOW (ref 0.7–4.0)
MCH: 30 pg (ref 26.0–34.0)
MCHC: 33.7 g/dL (ref 30.0–36.0)
MCV: 89.1 fL (ref 80.0–100.0)
Monocytes Absolute: 0.2 10*3/uL (ref 0.1–1.0)
Monocytes Relative: 11 %
Neutro Abs: 1.5 10*3/uL — ABNORMAL LOW (ref 1.7–7.7)
Neutrophils Relative %: 79 %
Platelets: 120 10*3/uL — ABNORMAL LOW (ref 150–400)
RBC: 2.93 MIL/uL — ABNORMAL LOW (ref 4.22–5.81)
RDW: 12.3 % (ref 11.5–15.5)
WBC: 1.8 10*3/uL — ABNORMAL LOW (ref 4.0–10.5)
nRBC: 0 % (ref 0.0–0.2)

## 2019-08-25 LAB — COMPREHENSIVE METABOLIC PANEL
ALT: 20 U/L (ref 0–44)
AST: 22 U/L (ref 15–41)
Albumin: 3.1 g/dL — ABNORMAL LOW (ref 3.5–5.0)
Alkaline Phosphatase: 53 U/L (ref 38–126)
Anion gap: 9 (ref 5–15)
BUN: 33 mg/dL — ABNORMAL HIGH (ref 8–23)
CO2: 26 mmol/L (ref 22–32)
Calcium: 8.3 mg/dL — ABNORMAL LOW (ref 8.9–10.3)
Chloride: 103 mmol/L (ref 98–111)
Creatinine, Ser: 1.08 mg/dL (ref 0.61–1.24)
GFR calc Af Amer: >60 mL/min (ref >60)
GFR calc non Af Amer: >60 mL/min (ref >60)
Glucose, Bld: 132 mg/dL — ABNORMAL HIGH (ref 70–99)
Potassium: 3.4 mmol/L — ABNORMAL LOW (ref 3.5–5.1)
Sodium: 138 mmol/L (ref 135–145)
Total Bilirubin: 0.9 mg/dL (ref 0.3–1.2)
Total Protein: 6.7 g/dL (ref 6.5–8.1)

## 2019-08-25 LAB — MAGNESIUM: Magnesium: 1 mg/dL — ABNORMAL LOW (ref 1.7–2.4)

## 2019-08-25 MED ORDER — SODIUM CHLORIDE 0.9% FLUSH
10.0000 mL | Freq: Once | INTRAVENOUS | Status: AC
  Administered 2019-08-25: 10 mL via INTRAVENOUS
  Filled 2019-08-25: qty 10

## 2019-08-25 MED ORDER — HEPARIN SOD (PORK) LOCK FLUSH 100 UNIT/ML IV SOLN
INTRAVENOUS | Status: AC
  Filled 2019-08-25: qty 5

## 2019-08-25 MED ORDER — SODIUM CHLORIDE 0.9 % IV SOLN
10.0000 mg | Freq: Once | INTRAVENOUS | Status: AC
  Administered 2019-08-25: 10 mg via INTRAVENOUS
  Filled 2019-08-25: qty 10

## 2019-08-25 MED ORDER — SODIUM CHLORIDE 0.9 % IV SOLN
INTRAVENOUS | Status: DC
  Filled 2019-08-25 (×2): qty 1000

## 2019-08-25 MED ORDER — MAGNESIUM CHLORIDE 64 MG PO TBEC: 1.0000 | DELAYED_RELEASE_TABLET | Freq: Two times a day (BID) | ORAL | 1 refills | Status: DC

## 2019-08-25 MED ORDER — SODIUM CHLORIDE 0.9 % IV SOLN
INTRAVENOUS | Status: DC
  Filled 2019-08-25: qty 250

## 2019-08-25 MED ORDER — HEPARIN SOD (PORK) LOCK FLUSH 100 UNIT/ML IV SOLN
500.0000 [IU] | Freq: Once | INTRAVENOUS | Status: AC
  Administered 2019-08-25: 500 [IU] via INTRAVENOUS
  Filled 2019-08-25: qty 5

## 2019-08-25 NOTE — Progress Notes (Signed)
Hematology/Oncology progress note [facility] Telephone:([phone] [phone]   Patient Care Team: Casilda Carls, MD as PCP - General (Internal Medicine) Earlie Server, [name], [name]  HISTORY OF PRESENTING ILLNESS:  Gregory Burton is a  79 y.o.  male with PMH listed below was seen in consultation at the request of  Casilda Carls, MD  for evaluation of lymph node.  Patient moved from [place] to [place] for about 3 weeks. He has noticed a neck knot on the right side of his neck. The knot is sore and makes him to cough and he feels it when he swallows. No swallowing difficulty.  Patient was accompanied by his wife. She reports that patient's previous PCP has done neck sonogram for evaluation.  Patient also had right lower molar tooth extraction done a few weeks before he noticed  He also took a course of antibiotics.  Due to his tooth pain, his appetite has decreased and he has lost weight loss, about 20 pounds, he can not specify the time frame.   Alcoholism History of prostate cancer. S/p Radiation/seed, he follows up with [facility].   INTERVAL HISTORY Gregory Burton is a 79 y.o. male who has above history reviewed by me today presents for evaluation prior to chemotherapy.  Patient has been on concurrent chemotherapy and radiation with cisplatin.   Today patient reports feeling weak and fatigued. Denies any nausea, vomiting, fever or chills, diarrhea.  He has sore throat Positive for dysuria. He is still able to eat soft diet.  Appetite is poor but per wife, patient eats similar portions as his baseline. Patient also takes nutritional supplements. Review of Systems  Constitutional: Positive for appetite change, fatigue and unexpected weight change. Negative for chills and fever.  HENT:   Negative for hearing  loss and voice change.        Sore throat  Eyes: Negative for eye problems and icterus.  Respiratory: Negative for chest tightness, cough and shortness of breath.   Cardiovascular: Negative for chest pain and leg swelling.  Gastrointestinal: Negative for abdominal distention, abdominal pain, blood in stool and nausea.  Endocrine: Negative for hot flashes.  Genitourinary: Positive for dysuria. Negative for difficulty urinating and frequency.   Musculoskeletal: Negative for arthralgias.  Skin: Negative for itching and rash.  Neurological: Negative for extremity weakness, light-headedness and numbness.  Hematological: Negative for adenopathy. Does not bruise/bleed easily.  Psychiatric/Behavioral: Negative for confusion.    MEDICAL HISTORY:  Past Medical History:  Diagnosis Date  . Alcohol abuse   . Blood transfusion without reported diagnosis 2013  . Cataract   . Dementia ([place])   . Glaucoma   . Gout   . Hypercholesteremia   . Hypertension   . Prostate CA ([place]) 2008  . 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 BRAIN SURGERY  2012  . HERNIA REPAIR    . PORTA CATH INSERTION N/A 07/27/2019   Procedure: PORTA CATH INSERTION;  Surgeon: Katha Cabal, MD;  Location: [facility];  Service: Cardiovascular;  Laterality: N/A;    SOCIAL HISTORY: Social History   Socioeconomic History  . Marital status: Married    Spouse name: Not on file  . Number of children: Not on file  . Years of education: Not on file  .  Highest education level: Not on file  Occupational History  . Not on file  Tobacco Use  . Smoking status: Former Smoker    Years: 21.00    Types: Cigarettes    Quit date: 05/11/2008    Years since quitting: 11.2  . Smokeless tobacco: Never Used  Vaping Use  . Vaping Use: Never used  Substance and Sexual Activity  . Alcohol use: Yes    Comment: 0.5 pint liquor a week  . Drug use:  Never  . Sexual activity: Not Currently  Other Topics Concern  . Not on file  Social History Narrative   Lives at home with wife. Wife states he has some dementia but is able to sign his own consent.   Social Determinants of Health   Financial Resource Strain:   . Difficulty of Paying Living Expenses:   Food Insecurity:   . Worried About Charity fundraiser in the Last Year:   . Arboriculturist in the Last Year:   Transportation Needs:   . Film/video editor (Medical):   Marland Kitchen Lack of Transportation (Non-Medical):   Physical Activity:   . Days of Exercise per Week:   . Minutes of Exercise per Session:   Stress:   . Feeling of Stress :   Social Connections:   . Frequency of Communication with Friends and Family:   . Frequency of Social Gatherings with Friends and Family:   . Attends Religious Services:   . Active Member of Clubs or Organizations:   . Attends Archivist Meetings:   Marland Kitchen Marital Status:   Intimate Partner Violence:   . Fear of Current or Ex-Partner:   . Emotionally Abused:   Marland Kitchen Physically Abused:   . Sexually Abused:     FAMILY HISTORY: No family history on file.  ALLERGIES:  is allergic to enalapril.  MEDICATIONS:  Current Outpatient Medications  Medication Sig Dispense Refill  . atorvastatin (LIPITOR) 20 MG tablet Take 20 mg by mouth daily.    Marland Kitchen dexamethasone (DECADRON) 4 MG tablet Take 2 tablets (8 mg total) by mouth See admin instructions. Take 2 tablets daily for 2 days after each chemotherapy 16 tablet 0  . donepezil (ARICEPT) 10 MG tablet Take 10 mg by mouth at bedtime.    . dorzolamide-timolol (COSOPT) 22.3-6.8 MG/ML ophthalmic solution Place 1 drop into both eyes 2 (two) times daily.     . folic acid (FOLVITE) 1 MG tablet Take 1 mg by mouth daily.    Marland Kitchen latanoprost (XALATAN) 0.005 % ophthalmic solution Place 1 drop into both eyes at bedtime.     . lidocaine (XYLOCAINE) 2 % solution Use as directed 15 mLs in the mouth or throat every 4  (four) hours as needed for mouth pain. 100 mL 2  . lidocaine-prilocaine (EMLA) cream Apply 1 application topically as needed. Apply small amount to port site approx 1-2 hours prior to appointment. 30 g 1  . magnesium chloride (SLOW-MAG) 64 MG TBEC SR tablet Take 1 tablet (64 mg total) by mouth daily. 60 tablet 1  . megestrol (MEGACE) 40 MG tablet TAKE 1 TABLET BY MOUTH TWICE A DAY 60 tablet 0  . nystatin (MYCOSTATIN) 100000 UNIT/ML suspension Take 5 mLs (500,000 Units total) by mouth 4 (four) times daily. 473 mL 0  . prednisoLONE 5 MG TABS tablet Take 5 mg by mouth daily as needed (Gout).    . predniSONE (DELTASONE) 5 MG tablet Take 5 mg by mouth daily with breakfast.  As needed for gout flare    . prochlorperazine (COMPAZINE) 10 MG tablet Take 1 tablet (10 mg total) by mouth every 6 (six) hours as needed (Nausea or vomiting). 30 tablet 1  . Thiamine HCl (VITAMIN B-1 PO) Take 100 mg by mouth daily.      No current facility-administered medications for this visit.   Facility-Administered Medications Ordered in Other Visits  Medication Dose Route Frequency Provider Last Rate Last Admin  . heparin lock flush 100 unit/mL  500 Units Intravenous Once Earlie Server, MD         PHYSICAL EXAMINATION: ECOG PERFORMANCE STATUS: 1 - Symptomatic but completely ambulatory Vitals:   08/25/19 0837  BP: (!) 138/93  Pulse: 77  Resp: 20  Temp: (!) 96.7 F (35.9 C)  SpO2: 99%   Filed Weights   08/25/19 0837  Weight: 103 lb 9.6 oz (47 kg)    Physical Exam Constitutional:      General: He is not in acute distress. HENT:     Head: Normocephalic and atraumatic.     Mouth/Throat:     Comments: Ritta Slot has resolved Eyes:     General: No scleral icterus. Neck:     Comments: Palpable 1-2cm right neck mass  Cardiovascular:     Rate and Rhythm: Normal rate and regular rhythm.     Heart sounds: Normal heart sounds.  Pulmonary:     Effort: Pulmonary effort is normal. No respiratory distress.     Breath  sounds: No wheezing.  Abdominal:     General: Bowel sounds are normal. There is no distension.     Palpations: Abdomen is soft.  Musculoskeletal:        General: No deformity. Normal range of motion.     Cervical back: Normal range of motion and neck supple.  Skin:    General: Skin is warm and dry.     Findings: No erythema or rash.  Neurological:     Mental Status: He is alert and oriented to person, place, and time. Mental status is at baseline.     Cranial Nerves: No cranial nerve deficit.     Coordination: Coordination normal.  Psychiatric:        Mood and Affect: Mood normal.     LABORATORY DATA:  I have reviewed the data as listed Lab Results  Component Value Date   WBC 3.4 (L) 08/18/2019   HGB 9.6 (L) 08/18/2019   HCT 28.6 (L) 08/18/2019   MCV 88.8 08/18/2019   PLT 114 (L) 08/18/2019   Recent Labs    08/04/19 0826 08/11/19 0807 08/18/19 0831  NA 138 141 142  K 3.8 3.8 3.7  CL 105 107 108  CO2 24 24 25   GLUCOSE 99 108* 141*  BUN 21 30* 29*  CREATININE 0.80 0.88 1.02  CALCIUM 8.9 8.6* 8.7*  GFRNONAA >60 >60 >60  GFRAA >60 >60 >60  PROT 7.3 6.9 6.8  ALBUMIN 3.5 3.2* 3.2*  AST 19 16 18   ALT 19 19 22   ALKPHOS 55 54 55  BILITOT 0.9 0.8 0.9   Iron/TIBC/Ferritin/ %Sat No results found for: IRON, TIBC, FERRITIN, IRONPCTSAT    RADIOGRAPHIC STUDIES: I have personally reviewed the radiological images as listed and agreed with the findings in the report. PERIPHERAL VASCULAR CATHETERIZATION  Result Date: 07/27/2019 See op note CT SOFT TISSUE NECK W CONTRAST  Result Date: 05/27/2019 CLINICAL DATA:  Right neck mass EXAM: CT NECK WITH CONTRAST TECHNIQUE: Multidetector CT imaging of the neck was performed  using the standard protocol following the bolus administration of intravenous contrast. CONTRAST:  7mL OMNIPAQUE IOHEXOL 300 MG/ML  SOLN COMPARISON:  None. FINDINGS: Pharynx and larynx: Enhancing mass in the right piriform sinus measuring 17 mm in diameter.  Appearance is concerning for neoplasm. Possible extension into the base of the epiglottis on the right. Base of tongue normal. No airway compromise. Larynx normal. Salivary glands: No inflammation, mass, or stone. Thyroid: Negative Lymph nodes: Cystic mass in the right lateral neck at the level of the piriform sinus. Cyst measures 15 x 24 mm and is deep to the sternocleidomastoid muscle. There is mild enhancement of the wall the cyst which is likely a necrotic lymph node. Cystic mass in the retropharyngeal soft tissues on the right in the region of the nasopharynx. This measures approximately 18 mm in diameter and likely is a necrotic lymph node. Cluster of lymph nodes in the right posterior neck measuring up to 12 mm in diameter. These are also likely malignant lymph nodes. Vascular: Normal vascular enhancement. Atherosclerotic calcification is present in the aortic arch and carotid bifurcation. 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 Posterior lymph node on the right appears to extend into the right jugular vein without occlusion. Limited intracranial: Negative Visualized orbits: Negative Mastoids and visualized paranasal sinuses: Mild mucosal edema in the ethmoid sinuses. Remaining sinuses clear. Mastoid clear bilaterally. Skeleton: Cervical spondylosis. Thoracic scoliosis. No acute skeletal abnormality. Right lower molar extraction site without complication. Upper chest: 10 mm spiculated nodule left upper lobe with central necrosis. Right apex clear. Other: None IMPRESSION: 1. Solid enhancing mass in the right piriform sinus measuring 17 mm, highly suspicious for carcinoma. 2. Multiple lymph nodes in the right neck including cystic nodes in the right posterior nasopharynx and right lateral neck. Posterior lymph nodes in the right neck also likely malignant, 1 of  which extends into the right jugular vein. Tissue sampling and direct laryngoscopy recommended to evaluate for neoplasm. 3. 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 PET-CT would be helpful for further evaluation and staging. 4. Atherosclerotic disease. 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 This appears to be a flow limiting lesion with decreased caliber of the right internal carotid artery which is patent. Electronically Signed   By: Franchot Gallo M.D.   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 A total of Versed 1.0 mg and Fentanyl 50 mcg was administered intravenously. Moderate Sedation Time: 12 minutes. The patient's level of consciousness and vital signs were monitored continuously by radiology nursing throughout the procedure under my direct supervision. FLUOROSCOPY TIME:  Fluoroscopy Time: None. COMPLICATIONS: None immediate. PROCEDURE: Informed written consent was obtained from the patient after a thorough discussion of the procedural risks, benefits and alternatives. All questions were addressed. Maximal Sterile Barrier Technique was utilized including caps, mask, sterile gowns, sterile gloves, sterile drape, hand hygiene and skin antiseptic. A timeout was performed prior to the initiation of the procedure. Previous imaging reviewed. Preliminary ultrasound performed. The right cervical abnormal adenopathy was localized  and marked. Ultrasound aspiration: Under sterile conditions and local anesthesia, an 18 gauge needle was advanced  into the cystic necrotic portion of the adenopathy. Syringe aspiration yielded 2 cc exudative fluid. Sample sent for cytology. Needle removed. Ultrasound core biopsy: Under sterile conditions and local anesthesia, an 18 gauge core biopsy needle was advanced to the residual component of the right cervical adenopathy. 18 gauge core biopsies obtained. These were placed in formalin. Needle removed. Postprocedure imaging demonstrates no hemorrhage or hematoma. Patient tolerated biopsy well. IMPRESSION: Successful ultrasound aspiration for cytology and core biopsy for pathology of the right neck necrotic cervical adenopathy. Electronically Signed   By: Jerilynn Mages.  Shick M.D.   On: 06/11/2019 13:10   PERIPHERAL VASCULAR CATHETERIZATION  Result Date: 07/27/2019 See op note  NM PET Image Initial (PI) Skull Base To Thigh  Result Date: 06/07/2019 CLINICAL DATA:  Initial treatment strategy for neck mass. EXAM: NUCLEAR MEDICINE PET SKULL BASE TO THIGH TECHNIQUE: 6.57 mCi F-18 FDG was injected intravenously. Full-ring PET imaging was performed from the skull base to thigh after the radiotracer. CT data was obtained and used for attenuation correction and anatomic localization. Fasting blood glucose: The 56 mg/dl COMPARISON:  CT 05/27/2019 FINDINGS: Mediastinal blood pool activity: SUV max 2.39 Liver activity: SUV max NA NECK: Base of tongue mass extending into the right peer form sinus is identified. This has an SUV max of 12.69. Multiple right-sided FDG avid lymph nodes are identified. Index right level 2 node within the measures 2.5 cm within SUV max of 5.7. Right level 2 node posterior and medial to the parotid gland measures 2.2 cm within SUV max of 9.6. Right FDG avid node at the level of the piriform sinus measures 2.2 cm within SUV max of 5.8. Incidental CT findings: none CHEST: No FDG avid axillary, supraclavicular, mediastinal, or hilar lymph nodes. Within the posterolateral left upper lobe there is a nodule with central  cavitary component measuring 1 cm. This has an SUV max of 1.8, image 89/3. Incidental CT findings: A few scattered calcified pleural plaques are noted suggesting previous asbestos exposure. Aortic atherosclerosis. Three vessel coronary artery atherosclerotic calcifications. ABDOMEN/PELVIS: No abnormal hypermetabolic activity within the liver, pancreas, adrenal glands, or spleen. No hypermetabolic lymph nodes in the abdomen or pelvis. Incidental CT findings: Aortic atherosclerosis. Seed implants noted within the prostate gland. SKELETON: No focal hypermetabolic activity to suggest skeletal metastasis. Incidental CT findings: none IMPRESSION: 1. 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 2. Multiple FDG avid cervical lymph nodes compatible with ipsilateral nodal metastasis. No FDG avid left cervical lymph nodes identified. 3. Left upper lobe pulmonary nodule exhibits mild FDG uptake within SUV max of 1.8. Cannot rule out pulmonary metastasis. 4. 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 No findings of pulmonary asbestosis. 5. Coronary artery calcifications 6.  Aortic Atherosclerosis (ICD10-I70.0). Electronically Signed   By: Kerby Moors M.D.   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 Staging imaging as also demonstrated a roughly 1 cm cavitary nodule in the left upper lobe. The patient presents for biopsy to determine whether this represents metastatic disease, primary lung carcinoma or benign nodule. EXAM: CT GUIDED CORE BIOPSY OF LEFT UPPER LOBE LUNG NODULE ANESTHESIA/SEDATION: 2.0 mg IV Versed; 100 mcg IV Fentanyl Total Moderate Sedation Time:  22 minutes. The patient's level of consciousness and physiologic  status were continuously monitored during the procedure by Radiology nursing. PROCEDURE: The  procedure risks, benefits, and alternatives were explained to the patient and his wife. Questions regarding the procedure were encouraged and answered. The patient and his wife understand and consent to the procedure. A time-out was performed prior to initiating the procedure. Supine imaging was performed through the upper to mid chest. The left upper chest wall was prepped with chlorhexidine in a sterile fashion, and a sterile drape was applied covering the operative field. A sterile gown and sterile gloves were used for the procedure. Local anesthesia was provided with 1% Lidocaine. Under CT guidance, a 17 gauge trocar needle was advanced to the level of a left upper lobe pulmonary nodule. After confirming needle tip position, 2 separate coaxial 18 gauge core biopsy samples were obtained. Additional imaging was performed. Aspiration was then performed via the outer needle at the level of the pleural space. Additional CT was performed after needle removal. COMPLICATIONS: Tiny left lateral pneumothorax. SIR level B: Nominal therapy (including overnight admission for observation), no consequence. FINDINGS: Small lateral and posterior left upper lobe nodule again demonstrated which measures approximately 9 x 12 mm. This nodule demonstrates central cavitation. One small tissue fragment and a second more intact core biopsy sample were able to be obtained. During the procedure, a small left lateral pneumothorax was sustained. This was treated with aspiration via the outer needle upon retraction resulting in complete aspiration and decompression of the pneumothorax. IMPRESSION: CT-guided core biopsy of cavitary left upper lobe lung nodule measuring approximately 9 x 12 mm by CT today. The procedure was complicated by a tiny lateral pneumothorax treated by needle aspiration which resulted in complete evacuation. A follow-up chest x-ray will be performed during recovery. Electronically Signed   By: Aletta Edouard M.D.    On: 06/29/2019 11:17   DG Chest Port 1 View  Result Date: 06/29/2019 CLINICAL DATA:  Status post percutaneous biopsy of left upper lobe lung nodule with aspiration small left pneumothorax at the time of the procedure. EXAM: PORTABLE CHEST 1 VIEW COMPARISON:  Imaging during CT-guided lung biopsy earlier today FINDINGS: The heart size and mediastinal contours are within normal limits. No pneumothorax identified. Small peripheral left upper lung opacity corresponds to the nodule sampled earlier today. No evidence of pleural fluid, consolidation or edema. The visualized skeletal structures are unremarkable. IMPRESSION: No pneumothorax after left upper lobe lung biopsy. Electronically Signed   By: Aletta Edouard M.D.   On: 06/29/2019 13:07   [place] CORE BIOPSY (LYMPH NODES)  Result Date: 06/11/2019 INDICATION: PET positive right cervical necrotic adenopathy, concern for head neck squamous cell carcinoma EXAM: Ultrasound aspiration of the right cervical necrotic adenopathy for cytology Ultrasound core biopsy of the right cervical necrotic adenopathy for pathology MEDICATIONS: 1% lidocaine local ANESTHESIA/SEDATION: Moderate (conscious) sedation was employed during this procedure. A total of Versed 1.0 mg and Fentanyl 50 mcg was administered intravenously. Moderate Sedation Time: 12 minutes. The patient's level of consciousness and vital signs were monitored continuously by radiology nursing throughout the procedure under my direct supervision. FLUOROSCOPY TIME:  Fluoroscopy Time: None. COMPLICATIONS: None immediate. PROCEDURE: Informed written consent was obtained from the patient after a thorough discussion of the procedural risks, benefits and alternatives. All questions were addressed. Maximal Sterile Barrier Technique was utilized including caps, mask, sterile gowns, sterile gloves, sterile drape, hand hygiene and skin antiseptic. A timeout was performed prior to the initiation of the procedure. Previous imaging  reviewed. Preliminary ultrasound performed. The right  cervical abnormal adenopathy was localized and marked. Ultrasound aspiration: Under sterile conditions and local anesthesia, an 18 gauge needle was advanced into the cystic necrotic portion of the adenopathy. Syringe aspiration yielded 2 cc exudative fluid. Sample sent for cytology. Needle removed. Ultrasound core biopsy: Under sterile conditions and local anesthesia, an 18 gauge core biopsy needle was advanced to the residual component of the right cervical adenopathy. 18 gauge core biopsies obtained. These were placed in formalin. Needle removed. Postprocedure imaging demonstrates no hemorrhage or hematoma. Patient tolerated biopsy well. IMPRESSION: Successful ultrasound aspiration for cytology and core biopsy for pathology of the right neck necrotic cervical adenopathy. Electronically Signed   By: Jerilynn Mages.  Shick M.D.   On: 06/11/2019 13:10       ASSESSMENT & PLAN:  1. 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 Hypomagnesemia   4. 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   Today he feels weak.  Also with severe electrolyte imbalance.  I will hold off chemotherapy today.  #Severe hypomagnesemia, magnesium level is 1. This is most likely due to chronic alcohol use as well as immunotherapy.  Patient will receive IV magnesium sulfate 4  g today.  Increase Slow-Mag to 1 tablet twice daily.  Discussed with patient and wife.  Patient also need to repeat blood work tomorrow +/- possible additional IV magnesium.  #Hypokalemia, likely secondary to hypomagnesemia, patient will receive IV potassium chloride 20 mEq x 1 today. #Weight loss, despite similar oral intake.  I will proceed with 1 L of IV normal saline  #Nausea, symptoms are better.  Continue Compazine Q6h as needed.  Dexamethasone 8mg  daily for 2 days after chemo.  I will give patient 10 mg of dexamethasone today.  #Sore throat, likely secondary to radiation.   continue viscous lidocaine swish and spit.  # Chronic alcohol use, per patient he has stopped drinking alcohol. #Lung lesion biopsy showed squamous cell carcinoma.-He will proceed with SBRT in the future We discussed about feeding tube placement prior to the treatments All questions were answered. The patient knows to call the clinic with any problems questions or concerns.   Return of visit:  1 week   Earlie Server, MD, PhD Hematology Oncology [facility] at [facility] Pager- [phone] 08/25/2019

## 2019-08-25 NOTE — Progress Notes (Signed)
Patient is feeling weak today.  Has a decrease in appetite with a 3 lb wt loss.  Has throat pain that is 5/10 today and can swallow soft foods.

## 2019-08-26 ENCOUNTER — Inpatient Hospital Stay: Payer: Medicare Other

## 2019-08-26 ENCOUNTER — Ambulatory Visit
Admission: RE | Admit: 2019-08-26 | Discharge: 2019-08-26 | Disposition: A | Discharge status: Home / Self Care | Payer: Medicare Other | Source: Ambulatory Visit | Attending: Radiation Oncology | Admitting: Radiation Oncology

## 2019-08-26 ENCOUNTER — Ambulatory Visit: Payer: Medicare Other | Admitting: Oncology

## 2019-08-26 ENCOUNTER — Inpatient Hospital Stay: Payer: Medicare Other | Attending: Oncology

## 2019-08-26 DIAGNOSIS — C12 Malignant neoplasm of pyriform sinus: Secondary | ICD-10-CM | POA: Insufficient documentation

## 2019-08-26 DIAGNOSIS — Z510 Encounter for antineoplastic radiation therapy: Secondary | ICD-10-CM | POA: Diagnosis not present

## 2019-08-26 DIAGNOSIS — C3492 Malignant neoplasm of unspecified part of left bronchus or lung: Secondary | ICD-10-CM

## 2019-08-26 DIAGNOSIS — C109 Malignant neoplasm of oropharynx, unspecified: Secondary | ICD-10-CM | POA: Insufficient documentation

## 2019-08-26 DIAGNOSIS — C3412 Malignant neoplasm of upper lobe, left bronchus or lung: Secondary | ICD-10-CM | POA: Diagnosis present

## 2019-08-26 LAB — COMPREHENSIVE METABOLIC PANEL
ALT: 20 U/L (ref 0–44)
AST: 19 U/L (ref 15–41)
Albumin: 3.2 g/dL — ABNORMAL LOW (ref 3.5–5.0)
Alkaline Phosphatase: 52 U/L (ref 38–126)
Anion gap: 9 (ref 5–15)
BUN: 31 mg/dL — ABNORMAL HIGH (ref 8–23)
CO2: 23 mmol/L (ref 22–32)
Calcium: 8.3 mg/dL — ABNORMAL LOW (ref 8.9–10.3)
Chloride: 106 mmol/L (ref 98–111)
Creatinine, Ser: 0.99 mg/dL (ref 0.61–1.24)
GFR calc Af Amer: >60 mL/min (ref >60)
GFR calc non Af Amer: >60 mL/min (ref >60)
Glucose, Bld: 117 mg/dL — ABNORMAL HIGH (ref 70–99)
Potassium: 3.7 mmol/L (ref 3.5–5.1)
Sodium: 138 mmol/L (ref 135–145)
Total Bilirubin: 0.7 mg/dL (ref 0.3–1.2)
Total Protein: 6.8 g/dL (ref 6.5–8.1)

## 2019-08-26 LAB — MAGNESIUM: Magnesium: 1.5 mg/dL — ABNORMAL LOW (ref 1.7–2.4)

## 2019-08-26 MED ORDER — SODIUM CHLORIDE 0.9 % IV SOLN
Freq: Once | INTRAVENOUS | Status: AC
  Filled 2019-08-26: qty 250

## 2019-08-26 MED ORDER — MAGNESIUM SULFATE 2 GM/50ML IV SOLN
2.0000 g | Freq: Once | INTRAVENOUS | Status: AC
  Administered 2019-08-26: 2 g via INTRAVENOUS
  Filled 2019-08-26: qty 50

## 2019-08-26 MED ORDER — SODIUM CHLORIDE 0.9% FLUSH
10.0000 mL | INTRAVENOUS | Status: DC | PRN
  Administered 2019-08-26: 10 mL via INTRAVENOUS
  Filled 2019-08-26: qty 10

## 2019-08-26 MED ORDER — HEPARIN SOD (PORK) LOCK FLUSH 100 UNIT/ML IV SOLN
INTRAVENOUS | Status: AC
  Filled 2019-08-26: qty 5

## 2019-08-26 MED ORDER — HEPARIN SOD (PORK) LOCK FLUSH 100 UNIT/ML IV SOLN
500.0000 [IU] | Freq: Once | INTRAVENOUS | Status: AC
  Administered 2019-08-26: 500 [IU] via INTRAVENOUS
  Filled 2019-08-26: qty 5

## 2019-08-26 NOTE — Progress Notes (Signed)
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  Patient receiving concurrent chemotherapy and radiation.   Met with patient during infusion and met with wife outside of infusion area.  Patient feels weak with electrolyte imbalances.  Did not receive chemotherapy yesterday but fluids and electrolytes.  More fluids and electrolytes today.  Patient reports that he is drinking 2-4 ensure daily and water.  Chicken is hard for him to cut up and swallow.  Likes his eggs in the am.      Medications: MVI, thiamine, folate, mag  Labs: Mag 1.5 today, K 3.7  Anthropometrics:   Weight 103 lb 9.6 oz on 6/30 106 lb 3.2 oz on 6/23 108 lb on 6/16 109 lb on 6/9 112 lb on 5/24 111 lb on 5/5    NUTRITION DIAGNOSIS: Inadequate oral intake continues   INTERVENTION:  Patient previously declined feeding tube prior to treatment.  Continue ensure enlive at least 4 bottles daily (1400 calories, 80 g protein) Continue MVI, thiamine, folate and Mag daily. Provided recipes for high calorie, high protein soups.  Wife has contact information    MONITORING, EVALUATION, GOAL: weight trends, intake   NEXT VISIT: July 8th following radiation   Dawood Spitler B. Zenia Resides, [place], [place] Registered Dietitian [phone] (pager)

## 2019-08-27 ENCOUNTER — Inpatient Hospital Stay: Payer: Medicare Other

## 2019-08-27 ENCOUNTER — Ambulatory Visit
Admission: RE | Admit: 2019-08-27 | Discharge: 2019-08-27 | Disposition: A | Discharge status: Home / Self Care | Payer: Medicare Other | Source: Ambulatory Visit | Attending: Radiation Oncology | Admitting: Radiation Oncology

## 2019-08-27 DIAGNOSIS — Z510 Encounter for antineoplastic radiation therapy: Secondary | ICD-10-CM | POA: Diagnosis not present

## 2019-08-31 ENCOUNTER — Ambulatory Visit
Admission: RE | Admit: 2019-08-31 | Discharge: 2019-08-31 | Disposition: A | Discharge status: Home / Self Care | Payer: Medicare Other | Source: Ambulatory Visit | Attending: Radiation Oncology | Admitting: Radiation Oncology

## 2019-08-31 ENCOUNTER — Inpatient Hospital Stay: Payer: Medicare Other

## 2019-08-31 DIAGNOSIS — Z510 Encounter for antineoplastic radiation therapy: Secondary | ICD-10-CM | POA: Diagnosis not present

## 2019-09-01 ENCOUNTER — Inpatient Hospital Stay: Payer: Medicare Other

## 2019-09-01 ENCOUNTER — Inpatient Hospital Stay (HOSPITAL_BASED_OUTPATIENT_CLINIC_OR_DEPARTMENT_OTHER): Payer: Medicare Other | Admitting: Oncology

## 2019-09-01 ENCOUNTER — Encounter: Payer: Self-pay | Admitting: Oncology

## 2019-09-01 ENCOUNTER — Ambulatory Visit
Admission: RE | Admit: 2019-09-01 | Discharge: 2019-09-01 | Disposition: A | Discharge status: Home / Self Care | Payer: Medicare Other | Source: Ambulatory Visit | Attending: Radiation Oncology | Admitting: Radiation Oncology

## 2019-09-01 ENCOUNTER — Other Ambulatory Visit: Payer: Self-pay

## 2019-09-01 VITALS — BP 159/80 | HR 71 | Temp 96.6°F | Resp 20 | Wt 102.7 lb

## 2019-09-01 DIAGNOSIS — C109 Malignant neoplasm of oropharynx, unspecified: Secondary | ICD-10-CM

## 2019-09-01 DIAGNOSIS — Z5111 Encounter for antineoplastic chemotherapy: Secondary | ICD-10-CM

## 2019-09-01 DIAGNOSIS — Z510 Encounter for antineoplastic radiation therapy: Secondary | ICD-10-CM | POA: Diagnosis not present

## 2019-09-01 DIAGNOSIS — C3492 Malignant neoplasm of unspecified part of left bronchus or lung: Secondary | ICD-10-CM

## 2019-09-01 DIAGNOSIS — F1010 Alcohol abuse, uncomplicated: Secondary | ICD-10-CM

## 2019-09-01 DIAGNOSIS — Z7189 Other specified counseling: Secondary | ICD-10-CM

## 2019-09-01 DIAGNOSIS — K1230 Oral mucositis (ulcerative), unspecified: Secondary | ICD-10-CM

## 2019-09-01 DIAGNOSIS — Z95828 Presence of other vascular implants and grafts: Secondary | ICD-10-CM

## 2019-09-01 LAB — COMPREHENSIVE METABOLIC PANEL
ALT: 16 U/L (ref 0–44)
AST: 16 U/L (ref 15–41)
Albumin: 3.2 g/dL — ABNORMAL LOW (ref 3.5–5.0)
Alkaline Phosphatase: 55 U/L (ref 38–126)
Anion gap: 9 (ref 5–15)
BUN: 19 mg/dL (ref 8–23)
CO2: 21 mmol/L — ABNORMAL LOW (ref 22–32)
Calcium: 8.7 mg/dL — ABNORMAL LOW (ref 8.9–10.3)
Chloride: 105 mmol/L (ref 98–111)
Creatinine, Ser: 0.86 mg/dL (ref 0.61–1.24)
GFR calc Af Amer: >60 mL/min (ref >60)
GFR calc non Af Amer: >60 mL/min (ref >60)
Glucose, Bld: 100 mg/dL — ABNORMAL HIGH (ref 70–99)
Potassium: 3.9 mmol/L (ref 3.5–5.1)
Sodium: 135 mmol/L (ref 135–145)
Total Bilirubin: 0.7 mg/dL (ref 0.3–1.2)
Total Protein: 6.9 g/dL (ref 6.5–8.1)

## 2019-09-01 LAB — CBC WITH DIFFERENTIAL/PLATELET
Abs Immature Granulocytes: 0.01 10*3/uL (ref 0.00–0.07)
Basophils Absolute: 0 10*3/uL (ref 0.0–0.1)
Basophils Relative: 0 %
Eosinophils Absolute: 0 10*3/uL (ref 0.0–0.5)
Eosinophils Relative: 0 %
HCT: 23.1 % — ABNORMAL LOW (ref 39.0–52.0)
Hemoglobin: 8 g/dL — ABNORMAL LOW (ref 13.0–17.0)
Immature Granulocytes: 1 %
Lymphocytes Relative: 16 %
Lymphs Abs: 0.3 10*3/uL — ABNORMAL LOW (ref 0.7–4.0)
MCH: 30.1 pg (ref 26.0–34.0)
MCHC: 34.6 g/dL (ref 30.0–36.0)
MCV: 86.8 fL (ref 80.0–100.0)
Monocytes Absolute: 0.3 10*3/uL (ref 0.1–1.0)
Monocytes Relative: 20 %
Neutro Abs: 1 10*3/uL — ABNORMAL LOW (ref 1.7–7.7)
Neutrophils Relative %: 63 %
Platelets: 131 10*3/uL — ABNORMAL LOW (ref 150–400)
RBC: 2.66 MIL/uL — ABNORMAL LOW (ref 4.22–5.81)
RDW: 13 % (ref 11.5–15.5)
WBC: 1.6 10*3/uL — ABNORMAL LOW (ref 4.0–10.5)
nRBC: 0 % (ref 0.0–0.2)

## 2019-09-01 LAB — MAGNESIUM: Magnesium: 1.3 mg/dL — ABNORMAL LOW (ref 1.7–2.4)

## 2019-09-01 MED ORDER — MAGIC MOUTHWASH W/LIDOCAINE
5.0000 mL | Freq: Four times a day (QID) | ORAL | 1 refills | Status: DC | PRN
Start: 2019-09-01 — End: 2020-05-11

## 2019-09-01 MED ORDER — MAGNESIUM SULFATE 4 GM/100ML IV SOLN
4.0000 g | Freq: Once | INTRAVENOUS | Status: AC
  Administered 2019-09-01: 4 g via INTRAVENOUS
  Filled 2019-09-01: qty 100

## 2019-09-01 MED ORDER — HEPARIN SOD (PORK) LOCK FLUSH 100 UNIT/ML IV SOLN
500.0000 [IU] | Freq: Once | INTRAVENOUS | Status: AC | PRN
  Administered 2019-09-01: 500 [IU]
  Filled 2019-09-01: qty 5

## 2019-09-01 MED ORDER — SODIUM CHLORIDE 0.9% FLUSH
10.0000 mL | INTRAVENOUS | Status: DC | PRN
  Administered 2019-09-01: 10 mL via INTRAVENOUS
  Filled 2019-09-01: qty 10

## 2019-09-01 MED ORDER — SODIUM CHLORIDE 0.9 % IV SOLN
Freq: Once | INTRAVENOUS | Status: AC
  Filled 2019-09-01: qty 250

## 2019-09-01 NOTE — Progress Notes (Signed)
Patient denies new problems/concerns today.   °

## 2019-09-01 NOTE — Progress Notes (Signed)
Hematology/Oncology progress note [facility] [phone] [phone]   Patient Care Team: Casilda Carls, MD as PCP - General (Internal Medicine) Earlie Server, [name], [name]  HISTORY OF PRESENTING ILLNESS:  Gregory Burton is a  79 y.o.  male with PMH listed below was seen in consultation at the request of  Casilda Carls, MD  for evaluation of lymph node.  Patient moved from [place] to [place] for about 3 weeks. He has noticed a neck knot on the right side of his neck. The knot is sore and makes him to cough and he feels it when he swallows. No swallowing difficulty.  Patient was accompanied by his wife. She reports that patient's previous PCP has done neck sonogram for evaluation.  Patient also had right lower molar tooth extraction done a few weeks before he noticed  He also took a course of antibiotics.  Due to his tooth pain, his appetite has decreased and he has lost weight loss, about 20 pounds, he can not specify the time frame.   Alcoholism History of prostate cancer. S/p Radiation/seed, he follows up with [facility].   INTERVAL HISTORY Gregory Burton is a 79 y.o. male who has above history reviewed by me today presents for evaluation prior to chemotherapy. Patient complained that he is port was taped too tight which caused pain.  Nurse has adjusted to take and the pain has improved. He was accompanied by his wife.  Patient dropped to 1 pound since last visit. Patient reports sore throat, he continues to be able to eat his regular portion of meals.  Patient takes nutritional supplements as well. Denies any fever, chills, nausea, vomiting. Review of Systems  Constitutional: Positive for appetite change and fatigue. Negative for chills and fever.  HENT:   Negative for hearing loss and voice change.         Sore throat  Eyes: Negative for eye problems and icterus.  Respiratory: Negative for chest tightness, cough and shortness of breath.   Cardiovascular: Negative for chest pain and leg swelling.  Gastrointestinal: Negative for abdominal distention, abdominal pain, blood in stool and nausea.  Endocrine: Negative for hot flashes.  Genitourinary: Negative for difficulty urinating, dysuria and frequency.   Musculoskeletal: Negative for arthralgias.  Skin: Negative for itching and rash.  Neurological: Negative for extremity weakness, light-headedness and numbness.  Hematological: Negative for adenopathy. Does not bruise/bleed easily.  Psychiatric/Behavioral: Negative for confusion.    MEDICAL HISTORY:  Past Medical History:  Diagnosis Date  . Alcohol abuse   . Blood transfusion without reported diagnosis 2013  . Cataract   . Dementia ([place])   . Glaucoma   . Gout   . Hypercholesteremia   . Hypertension   . Prostate CA ([place]) 2008  . 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 BRAIN SURGERY  2012  . HERNIA REPAIR    . PORTA CATH INSERTION N/A 07/27/2019   Procedure: PORTA CATH INSERTION;  Surgeon: Katha Cabal, MD;  Location: [facility];  Service: Cardiovascular;  Laterality: N/A;    SOCIAL HISTORY: Social History   Socioeconomic History  . Marital status: Married    Spouse name: Not on file  . Number of children: Not on file  . Years of education: Not on file  .  Highest education level: Not on file  Occupational History  . Not on file  Tobacco Use  . Smoking status: Former Smoker    Years: 21.00    Types: Cigarettes    Quit date: 05/11/2008    Years since quitting: 11.3  . Smokeless tobacco: Never Used  Vaping Use  . Vaping Use: Never used  Substance and Sexual Activity  . Alcohol use: Yes    Comment: 0.5 pint liquor a week  . Drug use: Never  . Sexual activity: Not  Currently  Other Topics Concern  . Not on file  Social History Narrative   Lives at home with wife. Wife states he has some dementia but is able to sign his own consent.   Social Determinants of Health   Financial Resource Strain:   . Difficulty of Paying Living Expenses:   Food Insecurity:   . Worried About Charity fundraiser in the Last Year:   . Arboriculturist in the Last Year:   Transportation Needs:   . Film/video editor (Medical):   Marland Kitchen Lack of Transportation (Non-Medical):   Physical Activity:   . Days of Exercise per Week:   . Minutes of Exercise per Session:   Stress:   . Feeling of Stress :   Social Connections:   . Frequency of Communication with Friends and Family:   . Frequency of Social Gatherings with Friends and Family:   . Attends Religious Services:   . Active Member of Clubs or Organizations:   . Attends Archivist Meetings:   Marland Kitchen Marital Status:   Intimate Partner Violence:   . Fear of Current or Ex-Partner:   . Emotionally Abused:   Marland Kitchen Physically Abused:   . Sexually Abused:     FAMILY HISTORY: No family history on file.  ALLERGIES:  is allergic to enalapril.  MEDICATIONS:  Current Outpatient Medications  Medication Sig Dispense Refill  . atorvastatin (LIPITOR) 20 MG tablet Take 20 mg by mouth daily.    Marland Kitchen dexamethasone (DECADRON) 4 MG tablet Take 2 tablets (8 mg total) by mouth See admin instructions. Take 2 tablets daily for 2 days after each chemotherapy 16 tablet 0  . donepezil (ARICEPT) 10 MG tablet Take 10 mg by mouth at bedtime.    . dorzolamide-timolol (COSOPT) 22.3-6.8 MG/ML ophthalmic solution Place 1 drop into both eyes 2 (two) times daily.     . folic acid (FOLVITE) 1 MG tablet Take 1 mg by mouth daily.    Marland Kitchen latanoprost (XALATAN) 0.005 % ophthalmic solution Place 1 drop into both eyes at bedtime.     . lidocaine (XYLOCAINE) 2 % solution Use as directed 15 mLs in the mouth or throat every 4 (four) hours as needed for mouth  pain. 100 mL 2  . lidocaine-prilocaine (EMLA) cream Apply 1 application topically as needed. Apply small amount to port site approx 1-2 hours prior to appointment. 30 g 1  . magnesium chloride (SLOW-MAG) 64 MG TBEC SR tablet Take 1 tablet (64 mg total) by mouth 2 (two) times daily. 60 tablet 1  . megestrol (MEGACE) 40 MG tablet TAKE 1 TABLET BY MOUTH TWICE A DAY 60 tablet 0  . nystatin (MYCOSTATIN) 100000 UNIT/ML suspension Take 5 mLs (500,000 Units total) by mouth 4 (four) times daily. 473 mL 0  . prednisoLONE 5 MG TABS tablet Take 5 mg by mouth daily as needed (Gout).    . predniSONE (DELTASONE) 5 MG tablet Take 5 mg by mouth  daily with breakfast. As needed for gout flare    . prochlorperazine (COMPAZINE) 10 MG tablet Take 1 tablet (10 mg total) by mouth every 6 (six) hours as needed (Nausea or vomiting). 30 tablet 1  . Thiamine HCl (VITAMIN B-1 PO) Take 100 mg by mouth daily.      No current facility-administered medications for this visit.   Facility-Administered Medications Ordered in Other Visits  Medication Dose Route Frequency Provider Last Rate Last Admin  . sodium chloride flush (NS) 0.9 % injection 10 mL  10 mL Intravenous PRN Earlie Server, MD         PHYSICAL EXAMINATION: ECOG PERFORMANCE STATUS: 1 - Symptomatic but completely ambulatory Vitals:   09/01/19 0846  BP: (!) 159/80  Pulse: 71  Resp: 20  Temp: (!) 96.6 F (35.9 C)   Filed Weights   09/01/19 0846  Weight: 102 lb 11.2 oz (46.6 kg)    Physical Exam Constitutional:      General: He is not in acute distress. HENT:     Head: Normocephalic and atraumatic.     Mouth/Throat:     Comments: Ritta Slot has resolved Eyes:     General: No scleral icterus. Cardiovascular:     Rate and Rhythm: Normal rate and regular rhythm.     Heart sounds: Normal heart sounds.  Pulmonary:     Effort: Pulmonary effort is normal. No respiratory distress.     Breath sounds: No wheezing.  Abdominal:     General: Bowel sounds are normal.  There is no distension.     Palpations: Abdomen is soft.  Musculoskeletal:        General: No deformity. Normal range of motion.     Cervical back: Normal range of motion and neck supple.  Skin:    General: Skin is warm and dry.     Findings: No erythema or rash.  Neurological:     Mental Status: He is alert and oriented to person, place, and time. Mental status is at baseline.     Cranial Nerves: No cranial nerve deficit.     Coordination: Coordination normal.  Psychiatric:        Mood and Affect: Mood normal.     LABORATORY DATA:  I have reviewed the data as listed Lab Results  Component Value Date   WBC 1.8 (L) 08/25/2019   HGB 8.8 (L) 08/25/2019   HCT 26.1 (L) 08/25/2019   MCV 89.1 08/25/2019   PLT 120 (L) 08/25/2019   Recent Labs    08/18/19 0831 08/25/19 0805 08/26/19 0804  NA 142 138 138  K 3.7 3.4* 3.7  CL 108 103 106  CO2 25 26 23   GLUCOSE 141* 132* 117*  BUN 29* 33* 31*  CREATININE 1.02 1.08 0.99  CALCIUM 8.7* 8.3* 8.3*  GFRNONAA >60 >60 >60  GFRAA >60 >60 >60  PROT 6.8 6.7 6.8  ALBUMIN 3.2* 3.1* 3.2*  AST 18 22 19   ALT 22 20 20   ALKPHOS 55 53 52  BILITOT 0.9 0.9 0.7   Iron/TIBC/Ferritin/ %Sat No results found for: IRON, TIBC, FERRITIN, IRONPCTSAT    RADIOGRAPHIC STUDIES: I have personally reviewed the radiological images as listed and agreed with the findings in the report. No results found.US Guided Needle Placement  Result Date: 06/11/2019 INDICATION: PET positive right cervical necrotic adenopathy, concern for head neck squamous cell carcinoma EXAM: Ultrasound aspiration of the right cervical necrotic adenopathy for cytology Ultrasound core biopsy of the right cervical necrotic adenopathy for pathology MEDICATIONS: 1%  lidocaine local ANESTHESIA/SEDATION: Moderate (conscious) sedation was employed during this procedure. A total of Versed 1.0 mg and Fentanyl 50 mcg was administered intravenously. Moderate Sedation Time: 12 minutes. The patient's  level of consciousness and vital signs were monitored continuously by radiology nursing throughout the procedure under my direct supervision. FLUOROSCOPY TIME:  Fluoroscopy Time: None. COMPLICATIONS: None immediate. PROCEDURE: Informed written consent was obtained from the patient after a thorough discussion of the procedural risks, benefits and alternatives. All questions were addressed. Maximal Sterile Barrier Technique was utilized including caps, mask, sterile gowns, sterile gloves, sterile drape, hand hygiene and skin antiseptic. A timeout was performed prior to the initiation of the procedure. Previous imaging reviewed. Preliminary ultrasound performed. The right cervical abnormal adenopathy was localized and marked. Ultrasound aspiration: Under sterile conditions and local anesthesia, an 18 gauge needle was advanced into the cystic necrotic portion of the adenopathy. Syringe aspiration yielded 2 cc exudative fluid. Sample sent for cytology. Needle removed. Ultrasound core biopsy: Under sterile conditions and local anesthesia, an 18 gauge core biopsy needle was advanced to the residual component of the right cervical adenopathy. 18 gauge core biopsies obtained. These were placed in formalin. Needle removed. Postprocedure imaging demonstrates no hemorrhage or hematoma. Patient tolerated biopsy well. IMPRESSION: Successful ultrasound aspiration for cytology and core biopsy for pathology of the right neck necrotic cervical adenopathy. Electronically Signed   By: Jerilynn Mages.  Shick M.D.   On: 06/11/2019 13:10   PERIPHERAL VASCULAR CATHETERIZATION  Result Date: 07/27/2019 See op note  NM PET Image Initial (PI) Skull Base To Thigh  Result Date: 06/07/2019 CLINICAL DATA:  Initial treatment strategy for neck mass. EXAM: NUCLEAR MEDICINE PET SKULL BASE TO THIGH TECHNIQUE: 6.57 mCi F-18 FDG was injected intravenously. Full-ring PET imaging was performed from the skull base to thigh after the radiotracer. CT data was  obtained and used for attenuation correction and anatomic localization. Fasting blood glucose: The 56 mg/dl COMPARISON:  CT 05/27/2019 FINDINGS: Mediastinal blood pool activity: SUV max 2.39 Liver activity: SUV max NA NECK: Base of tongue mass extending into the right peer form sinus is identified. This has an SUV max of 12.69. Multiple right-sided FDG avid lymph nodes are identified. Index right level 2 node within the measures 2.5 cm within SUV max of 5.7. Right level 2 node posterior and medial to the parotid gland measures 2.2 cm within SUV max of 9.6. Right FDG avid node at the level of the piriform sinus measures 2.2 cm within SUV max of 5.8. Incidental CT findings: none CHEST: No FDG avid axillary, supraclavicular, mediastinal, or hilar lymph nodes. Within the posterolateral left upper lobe there is a nodule with central cavitary component measuring 1 cm. This has an SUV max of 1.8, image 89/3. Incidental CT findings: A few scattered calcified pleural plaques are noted suggesting previous asbestos exposure. Aortic atherosclerosis. Three vessel coronary artery atherosclerotic calcifications. ABDOMEN/PELVIS: No abnormal hypermetabolic activity within the liver, pancreas, adrenal glands, or spleen. No hypermetabolic lymph nodes in the abdomen or pelvis. Incidental CT findings: Aortic atherosclerosis. Seed implants noted within the prostate gland. SKELETON: No focal hypermetabolic activity to suggest skeletal metastasis. Incidental CT findings: none IMPRESSION: 1. 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 2. Multiple FDG avid cervical lymph nodes compatible with ipsilateral nodal metastasis. No FDG avid left cervical lymph nodes identified. 3. Left upper lobe pulmonary nodule exhibits mild FDG uptake within SUV max of 1.8. Cannot rule out  pulmonary metastasis. 4. 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 No findings of pulmonary asbestosis. 5. Coronary artery calcifications 6.  Aortic Atherosclerosis (ICD10-I70.0). Electronically Signed   By: Kerby Moors M.D.   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 Staging imaging as also demonstrated a roughly 1 cm cavitary nodule in the left upper lobe. The patient presents for biopsy to determine whether this represents metastatic disease, primary lung carcinoma or benign nodule. EXAM: CT GUIDED CORE BIOPSY OF LEFT UPPER LOBE LUNG NODULE ANESTHESIA/SEDATION: 2.0 mg IV Versed; 100 mcg IV Fentanyl Total Moderate Sedation Time:  22 minutes. The patient's level of consciousness and physiologic status were continuously monitored during the procedure by Radiology nursing. PROCEDURE: The procedure risks, benefits, and alternatives were explained to the patient and his wife. Questions regarding the procedure were encouraged and answered. The patient and his wife understand and consent to the procedure. A time-out was performed prior to initiating the procedure. Supine imaging was performed through the upper to mid chest. The left upper chest wall was prepped with chlorhexidine in a sterile fashion, and a sterile drape was applied covering the operative field. A sterile gown and sterile gloves were used for the procedure. Local anesthesia was provided with 1% Lidocaine. Under CT guidance, a 17 gauge trocar needle was advanced to the level of a left upper lobe pulmonary nodule. After confirming needle tip position, 2 separate coaxial 18 gauge core biopsy samples were obtained. Additional imaging was performed. Aspiration was then performed via the outer needle at the level of the pleural space. Additional CT was performed after needle removal. COMPLICATIONS: Tiny left lateral pneumothorax. SIR level B: Nominal therapy (including overnight admission for observation), no  consequence. FINDINGS: Small lateral and posterior left upper lobe nodule again demonstrated which measures approximately 9 x 12 mm. This nodule demonstrates central cavitation. One small tissue fragment and a second more intact core biopsy sample were able to be obtained. During the procedure, a small left lateral pneumothorax was sustained. This was treated with aspiration via the outer needle upon retraction resulting in complete aspiration and decompression of the pneumothorax. IMPRESSION: CT-guided core biopsy of cavitary left upper lobe lung nodule measuring approximately 9 x 12 mm by CT today. The procedure was complicated by a tiny lateral pneumothorax treated by needle aspiration which resulted in complete evacuation. A follow-up chest x-ray will be performed during recovery. Electronically Signed   By: Aletta Edouard M.D.   On: 06/29/2019 11:17   DG Chest Port 1 View  Result Date: 06/29/2019 CLINICAL DATA:  Status post percutaneous biopsy of left upper lobe lung nodule with aspiration small left pneumothorax at the time of the procedure. EXAM: PORTABLE CHEST 1 VIEW COMPARISON:  Imaging during CT-guided lung biopsy earlier today FINDINGS: The heart size and mediastinal contours are within normal limits. No pneumothorax identified. Small peripheral left upper lung opacity corresponds to the nodule sampled earlier today. No evidence of pleural fluid, consolidation or edema. The visualized skeletal structures are unremarkable. IMPRESSION: No pneumothorax after left upper lobe lung biopsy. Electronically Signed   By: Aletta Edouard M.D.   On: 06/29/2019 13:07   [place] CORE BIOPSY (LYMPH NODES)  Result Date: 06/11/2019 INDICATION: PET positive right cervical necrotic adenopathy, concern for head neck squamous cell carcinoma EXAM: Ultrasound aspiration of the right cervical necrotic adenopathy for cytology Ultrasound core biopsy of the right cervical necrotic adenopathy  for pathology MEDICATIONS: 1%  lidocaine local ANESTHESIA/SEDATION: Moderate (conscious) sedation was employed during this procedure. A total of Versed 1.0 mg and Fentanyl 50 mcg was administered intravenously. Moderate Sedation Time: 12 minutes. The patient's level of consciousness and vital signs were monitored continuously by radiology nursing throughout the procedure under my direct supervision. FLUOROSCOPY TIME:  Fluoroscopy Time: None. COMPLICATIONS: None immediate. PROCEDURE: Informed written consent was obtained from the patient after a thorough discussion of the procedural risks, benefits and alternatives. All questions were addressed. Maximal Sterile Barrier Technique was utilized including caps, mask, sterile gowns, sterile gloves, sterile drape, hand hygiene and skin antiseptic. A timeout was performed prior to the initiation of the procedure. Previous imaging reviewed. Preliminary ultrasound performed. The right cervical abnormal adenopathy was localized and marked. Ultrasound aspiration: Under sterile conditions and local anesthesia, an 18 gauge needle was advanced into the cystic necrotic portion of the adenopathy. Syringe aspiration yielded 2 cc exudative fluid. Sample sent for cytology. Needle removed. Ultrasound core biopsy: Under sterile conditions and local anesthesia, an 18 gauge core biopsy needle was advanced to the residual component of the right cervical adenopathy. 18 gauge core biopsies obtained. These were placed in formalin. Needle removed. Postprocedure imaging demonstrates no hemorrhage or hematoma. Patient tolerated biopsy well. IMPRESSION: Successful ultrasound aspiration for cytology and core biopsy for pathology of the right neck necrotic cervical adenopathy. Electronically Signed   By: Jerilynn Mages.  Shick M.D.   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 Hypomagnesemia   4. Encounter for antineoplastic chemotherapy   5.  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 Chemotherapy/radiation induced neutropenia.  ANC is 1.  Borderline.  I discussed with patient that it is probably okay to proceed with another dose of cisplatin with borderline ANC, anticipate that neutropenia will get worse so potentially he is at higher risk of infection. Patient and wife cannot make decisions.  I called patient's son and had a lengthy discussion with patient and family members.  Son helps patient to decide not to proceed with last dose of cisplatin. He is radiation will be finished next week.  No additional chemotherapy is planned at this point. He will follow up in 1 week for symptom management.  #Severe chronic hypomagnesemia, magnesium level is 1.3, proceed with magnesium sulfate 4 g x 1 today.  Continue oral Slow-Mag 64 mg 2 tablets daily. Repeat magnesium in 1 week.  I suspect that hypomagnesemia is likely due to chronic alcoholism.  may be also secondary to cisplatin.  #Hypokalemia, potassium has normalized.   #Weight loss, weight is relatively stable.  Continue nutrition supplementation. #Sore throat, likely mucositis secondary to radiation.  Recommend patient to discuss lidocaine swish and spit.  # Chronic alcohol use, per patient he has stopped drinking alcohol. #Lung lesion biopsy showed squamous cell carcinoma.-He will proceed with SBRT in the future We discussed about  feeding tube placement  prior to the treatments All questions were answered. The patient knows to call the clinic with any problems questions or concerns.   Return of visit:  1 week   Earlie Server, MD, PhD Hematology Oncology [facility] at [facility] Pager- [phone] 09/01/2019

## 2019-09-02 ENCOUNTER — Inpatient Hospital Stay: Payer: Medicare Other

## 2019-09-02 ENCOUNTER — Encounter: Payer: Self-pay | Admitting: Dietician

## 2019-09-02 ENCOUNTER — Ambulatory Visit
Admission: RE | Admit: 2019-09-02 | Discharge: 2019-09-02 | Disposition: A | Discharge status: Home / Self Care | Payer: Medicare Other | Source: Ambulatory Visit | Attending: Radiation Oncology | Admitting: Radiation Oncology

## 2019-09-02 DIAGNOSIS — Z510 Encounter for antineoplastic radiation therapy: Secondary | ICD-10-CM | POA: Diagnosis not present

## 2019-09-02 NOTE — Progress Notes (Signed)
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   Met with patient and patient's wife after radiation. No further chemotherapy planned at this time. Wife reports patient is eating 3 meals with snacks, drinking 4 Ensure Enlive, and 4 bottles of water daily. Patient had 2 eggs, 2 pieces of cheese toast, applesauce and Ensure for breakfast. Patient likes mashed potatoes with gravy, usually eats them every night, having fish for dinner tonight. Patient reports having some swallowing difficulties associated with discomfort on right side of neck.   Medications: Folvite, Thiamine, Megace, Magic Mouthwash, MgCl, Compazine, Thiamine  Labs: Mg 1.3 (L), Hgb 8.0 (L), WBC 1.6 (L)  Anthropometrics:  Weight today 102 lb 11.2 oz decreased from 103 lb 9.6 oz on 6/30  6/23- 106 lb 3.2 oz 6/16- 108 lb 5/24- 112 lb   NUTRITION DIAGNOSIS: Inadequate oral intake continues   INTERVENTION:  Patient previously declined feeding tube prior to treatment Continue Ensure Enlive, 4x/day (1400 kcal, 80 g protein) Complimentary case given to patient today Continue MVI, Folate, Mg daily Suggested blenderizing foods, adding extra sauces/gravy to meats, tucking chin to chest when swallowing Handout on Soft and Moist High Calorie, High Protein Foods  MONITORING, EVALUATION, GOAL: weight trends, intake   NEXT VISIT: Monday, July 12 after radiation  Lajuan Lines, RD, LDN Clinical Nutrition After Hours/Weekend Pager # in [place]

## 2019-09-03 ENCOUNTER — Ambulatory Visit
Admission: RE | Admit: 2019-09-03 | Discharge: 2019-09-03 | Disposition: A | Discharge status: Home / Self Care | Payer: Medicare Other | Source: Ambulatory Visit | Attending: Radiation Oncology | Admitting: Radiation Oncology

## 2019-09-03 ENCOUNTER — Inpatient Hospital Stay: Payer: Medicare Other

## 2019-09-03 DIAGNOSIS — Z510 Encounter for antineoplastic radiation therapy: Secondary | ICD-10-CM | POA: Diagnosis not present

## 2019-09-06 ENCOUNTER — Inpatient Hospital Stay: Payer: Medicare Other

## 2019-09-06 ENCOUNTER — Ambulatory Visit
Admission: RE | Admit: 2019-09-06 | Discharge: 2019-09-06 | Disposition: A | Discharge status: Home / Self Care | Payer: Medicare Other | Source: Ambulatory Visit | Attending: Radiation Oncology | Admitting: Radiation Oncology

## 2019-09-06 DIAGNOSIS — Z510 Encounter for antineoplastic radiation therapy: Secondary | ICD-10-CM | POA: Diagnosis not present

## 2019-09-06 NOTE — Progress Notes (Signed)
Nutrition Follow-up:  Patient with base of the tongue mass extending to right priform sinus stage IV receiving concurrent chemotherapy and radiation.  Patient did not receive last 2 chemotherapy doses. Last radiation treatment will be on 7/13.    Met with patient and wife today prior to radiation.  Patient reports that he feels weak today.  Some sore throat, loss of taste, dry mouth.  Reports that he is drinking 4 ensure enlive shakes daily along with water and gatorade.  Reports that yesterday he was able to eat eggs with cheese, toast, applesauce and drank ensure for breakfast.  Ate peanut butter and jelly sandwich for lunch.  Dinner last night was chicken breast, mashed potatoes with gravy, green beans and macaroni and cheese.      Medications: megace  Labs: reviewed  Anthropometrics:   Weight 102 lb today stable from 102 last week.   103 lb 9.6 oz on 6/30 106 lb 3.2 oz on 6/23 108 lb 6/16 112 lb on 5/24   NUTRITION DIAGNOSIS: Inadequate oral intake continues   INTERVENTION:  Continue ensure enlive at least 4 daily (1400 kcal and 80 g protein).  Complimentary case of ensure given today Stressed importance of good nutrition after treatment ends.   Encouraged high calorie, high protein soft foods Patient has contact information   MONITORING, EVALUATION, GOAL: weight trends, intake   NEXT VISIT: July 29 phone f/u  Niaja Stickley B. Zenia Resides, [place], [place] Registered Dietitian [phone] (pager)

## 2019-09-07 ENCOUNTER — Inpatient Hospital Stay: Payer: Medicare Other

## 2019-09-07 ENCOUNTER — Ambulatory Visit
Admission: RE | Admit: 2019-09-07 | Discharge: 2019-09-07 | Disposition: A | Discharge status: Home / Self Care | Payer: Medicare Other | Source: Ambulatory Visit | Attending: Radiation Oncology | Admitting: Radiation Oncology

## 2019-09-07 DIAGNOSIS — Z510 Encounter for antineoplastic radiation therapy: Secondary | ICD-10-CM | POA: Diagnosis not present

## 2019-09-08 ENCOUNTER — Inpatient Hospital Stay: Payer: Medicare Other

## 2019-09-08 ENCOUNTER — Other Ambulatory Visit: Payer: Self-pay

## 2019-09-08 ENCOUNTER — Encounter: Payer: Self-pay | Admitting: Oncology

## 2019-09-08 ENCOUNTER — Inpatient Hospital Stay (HOSPITAL_BASED_OUTPATIENT_CLINIC_OR_DEPARTMENT_OTHER): Payer: Medicare Other | Admitting: Oncology

## 2019-09-08 VITALS — BP 113/65 | HR 68 | Temp 97.9°F | Resp 18 | Wt 103.0 lb

## 2019-09-08 DIAGNOSIS — C109 Malignant neoplasm of oropharynx, unspecified: Secondary | ICD-10-CM

## 2019-09-08 DIAGNOSIS — Z95828 Presence of other vascular implants and grafts: Secondary | ICD-10-CM

## 2019-09-08 DIAGNOSIS — C3492 Malignant neoplasm of unspecified part of left bronchus or lung: Secondary | ICD-10-CM

## 2019-09-08 DIAGNOSIS — K1230 Oral mucositis (ulcerative), unspecified: Secondary | ICD-10-CM

## 2019-09-08 DIAGNOSIS — Z7189 Other specified counseling: Secondary | ICD-10-CM

## 2019-09-08 DIAGNOSIS — F1010 Alcohol abuse, uncomplicated: Secondary | ICD-10-CM

## 2019-09-08 LAB — CBC WITH DIFFERENTIAL/PLATELET
Abs Immature Granulocytes: 0.01 10*3/uL (ref 0.00–0.07)
Basophils Absolute: 0 10*3/uL (ref 0.0–0.1)
Basophils Relative: 0 %
Eosinophils Absolute: 0 10*3/uL (ref 0.0–0.5)
Eosinophils Relative: 0 %
HCT: 23.1 % — ABNORMAL LOW (ref 39.0–52.0)
Hemoglobin: 7.8 g/dL — ABNORMAL LOW (ref 13.0–17.0)
Immature Granulocytes: 0 %
Lymphocytes Relative: 9 %
Lymphs Abs: 0.3 10*3/uL — ABNORMAL LOW (ref 0.7–4.0)
MCH: 30.5 pg (ref 26.0–34.0)
MCHC: 33.8 g/dL (ref 30.0–36.0)
MCV: 90.2 fL (ref 80.0–100.0)
Monocytes Absolute: 0.6 10*3/uL (ref 0.1–1.0)
Monocytes Relative: 19 %
Neutro Abs: 2.1 10*3/uL (ref 1.7–7.7)
Neutrophils Relative %: 72 %
Platelets: 159 10*3/uL (ref 150–400)
RBC: 2.56 MIL/uL — ABNORMAL LOW (ref 4.22–5.81)
RDW: 14.6 % (ref 11.5–15.5)
WBC: 2.9 10*3/uL — ABNORMAL LOW (ref 4.0–10.5)
nRBC: 0 % (ref 0.0–0.2)

## 2019-09-08 LAB — COMPREHENSIVE METABOLIC PANEL
ALT: 13 U/L (ref 0–44)
AST: 16 U/L (ref 15–41)
Albumin: 3.2 g/dL — ABNORMAL LOW (ref 3.5–5.0)
Alkaline Phosphatase: 56 U/L (ref 38–126)
Anion gap: 8 (ref 5–15)
BUN: 23 mg/dL (ref 8–23)
CO2: 25 mmol/L (ref 22–32)
Calcium: 9 mg/dL (ref 8.9–10.3)
Chloride: 107 mmol/L (ref 98–111)
Creatinine, Ser: 0.98 mg/dL (ref 0.61–1.24)
GFR calc Af Amer: >60 mL/min (ref >60)
GFR calc non Af Amer: >60 mL/min (ref >60)
Glucose, Bld: 125 mg/dL — ABNORMAL HIGH (ref 70–99)
Potassium: 4.2 mmol/L (ref 3.5–5.1)
Sodium: 140 mmol/L (ref 135–145)
Total Bilirubin: 0.5 mg/dL (ref 0.3–1.2)
Total Protein: 6.8 g/dL (ref 6.5–8.1)

## 2019-09-08 LAB — MAGNESIUM: Magnesium: 1.6 mg/dL — ABNORMAL LOW (ref 1.7–2.4)

## 2019-09-08 MED ORDER — SODIUM CHLORIDE 0.9% FLUSH
10.0000 mL | INTRAVENOUS | Status: DC | PRN
  Administered 2019-09-08: 10 mL via INTRAVENOUS
  Filled 2019-09-08: qty 10

## 2019-09-08 MED ORDER — SODIUM CHLORIDE 0.9 % IV SOLN
Freq: Once | INTRAVENOUS | Status: AC
  Filled 2019-09-08: qty 1000

## 2019-09-08 MED ORDER — HEPARIN SOD (PORK) LOCK FLUSH 100 UNIT/ML IV SOLN
500.0000 [IU] | Freq: Once | INTRAVENOUS | Status: AC
  Administered 2019-09-08: 500 [IU] via INTRAVENOUS
  Filled 2019-09-08: qty 5

## 2019-09-08 MED ORDER — HEPARIN SOD (PORK) LOCK FLUSH 100 UNIT/ML IV SOLN
INTRAVENOUS | Status: AC
  Filled 2019-09-08: qty 5

## 2019-09-08 NOTE — Progress Notes (Signed)
Patient here for follow up. Pt reports that his memory is not getting better and he has been very anxious "worry about everything." Pt also reports that his throat continues to be sore, which he has not started taking magic mouthwash because he was unsure of the instructions. Instructed pt and wife on how to take medication. Pt's wife has questions regarding medications.

## 2019-09-08 NOTE — Progress Notes (Signed)
Hematology/Oncology progress note [facility] Telephone:([phone] [phone]   Patient Care Team: Casilda Carls, MD as PCP - General (Internal Medicine) Earlie Server, [name], [name]  HISTORY OF PRESENTING ILLNESS:  Gregory Burton is a  79 y.o.  male with PMH listed below was seen in consultation at the request of  Casilda Carls, MD  for evaluation of lymph node.  Patient moved from [place] to [place] for about 3 weeks. He has noticed a neck knot on the right side of his neck. The knot is sore and makes him to cough and he feels it when he swallows. No swallowing difficulty.  Patient was accompanied by his wife. She reports that patient's previous PCP has done neck sonogram for evaluation.  Patient also had right lower molar tooth extraction done a few weeks before he noticed  He also took a course of antibiotics.  Due to his tooth pain, his appetite has decreased and he has lost weight loss, about 20 pounds, he can not specify the time frame.   Alcoholism History of prostate cancer. S/p Radiation/seed, he follows up with [facility].   INTERVAL HISTORY Gregory Burton is a 79 y.o. male who has above history reviewed by me today presents for follow-up of treatment. He has finished concurrent chemotherapy and radiation. He was accompanied by his wife.   Patient continues to have sore throat, dysphagia.  Weight is stable.  Gained 1 pound since last visit.  Denies any nausea vomiting diarrhea. Appetite is fair.   Review of Systems  Constitutional: Positive for appetite change and fatigue. Negative for chills and fever.  HENT:   Negative for hearing loss and voice change.        Sore throat  Eyes: Negative for eye problems and icterus.  Respiratory: Negative for chest tightness, cough and shortness of breath.     Cardiovascular: Negative for chest pain and leg swelling.  Gastrointestinal: Negative for abdominal distention, abdominal pain, blood in stool and nausea.  Endocrine: Negative for hot flashes.  Genitourinary: Negative for difficulty urinating, dysuria and frequency.   Musculoskeletal: Negative for arthralgias.  Skin: Negative for itching and rash.  Neurological: Negative for extremity weakness, light-headedness and numbness.  Hematological: Negative for adenopathy. Does not bruise/bleed easily.  Psychiatric/Behavioral: Negative for confusion.       Forgetful    MEDICAL HISTORY:  Past Medical History:  Diagnosis Date  . Alcohol abuse   . Blood transfusion without reported diagnosis 2013  . Cataract   . Dementia ([place])   . Glaucoma   . Gout   . Hypercholesteremia   . Hypertension   . Prostate CA ([place]) 2008  . 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 BRAIN SURGERY  2012  . HERNIA REPAIR    . PORTA CATH INSERTION N/A 07/27/2019   Procedure: PORTA CATH INSERTION;  Surgeon: Katha Cabal, MD;  Location: [facility];  Service: Cardiovascular;  Laterality: N/A;    SOCIAL HISTORY: Social History   Socioeconomic History  . Marital status: Married    Spouse name: Not on file  . Number of children: Not on file  . Years of education: Not on file  . Highest education level: Not on file  Occupational History  . Not on file  Tobacco Use  .  Smoking status: Former Smoker    Years: 21.00    Types: Cigarettes    Quit date: 05/11/2008    Years since quitting: 11.3  . Smokeless tobacco: Never Used  Vaping Use  . Vaping Use: Never used  Substance and Sexual Activity  . Alcohol use: Yes    Comment: 0.5 pint liquor a week  . Drug use: Never  . Sexual activity: Not Currently  Other Topics Concern  . Not on file  Social History Narrative   Lives at home with wife. Wife states he has some  dementia but is able to sign his own consent.   Social Determinants of Health   Financial Resource Strain:   . Difficulty of Paying Living Expenses:   Food Insecurity:   . Worried About Charity fundraiser in the Last Year:   . Arboriculturist in the Last Year:   Transportation Needs:   . Film/video editor (Medical):   Marland Kitchen Lack of Transportation (Non-Medical):   Physical Activity:   . Days of Exercise per Week:   . Minutes of Exercise per Session:   Stress:   . Feeling of Stress :   Social Connections:   . Frequency of Communication with Friends and Family:   . Frequency of Social Gatherings with Friends and Family:   . Attends Religious Services:   . Active Member of Clubs or Organizations:   . Attends Archivist Meetings:   Marland Kitchen Marital Status:   Intimate Partner Violence:   . Fear of Current or Ex-Partner:   . Emotionally Abused:   Marland Kitchen Physically Abused:   . Sexually Abused:     FAMILY HISTORY: No family history on file.  ALLERGIES:  is allergic to enalapril.  MEDICATIONS:  Current Outpatient Medications  Medication Sig Dispense Refill  . atorvastatin (LIPITOR) 20 MG tablet Take 20 mg by mouth daily.    Marland Kitchen dexamethasone (DECADRON) 4 MG tablet Take 2 tablets (8 mg total) by mouth See admin instructions. Take 2 tablets daily for 2 days after each chemotherapy 16 tablet 0  . donepezil (ARICEPT) 10 MG tablet Take 10 mg by mouth at bedtime.    . dorzolamide-timolol (COSOPT) 22.3-6.8 MG/ML ophthalmic solution Place 1 drop into both eyes 2 (two) times daily.     . folic acid (FOLVITE) 1 MG tablet Take 1 mg by mouth daily.    Marland Kitchen latanoprost (XALATAN) 0.005 % ophthalmic solution Place 1 drop into both eyes at bedtime.     . lidocaine (XYLOCAINE) 2 % solution Use as directed 15 mLs in the mouth or throat every 4 (four) hours as needed for mouth pain. 100 mL 2  . lidocaine-prilocaine (EMLA) cream Apply 1 application topically as needed. Apply small amount to port site  approx 1-2 hours prior to appointment. 30 g 1  . magic mouthwash w/lidocaine SOLN Take 5 mLs by mouth 4 (four) times daily as needed for mouth pain. Sig: Swish/Swallow 5-10 ml four times a day as needed. Dispense 480 ml. 1RF 480 mL 1  . magnesium chloride (SLOW-MAG) 64 MG TBEC SR tablet Take 1 tablet (64 mg total) by mouth 2 (two) times daily. 60 tablet 1  . megestrol (MEGACE) 40 MG tablet TAKE 1 TABLET BY MOUTH TWICE A DAY 60 tablet 0  . prednisoLONE 5 MG TABS tablet Take 5 mg by mouth daily as needed (Gout).    . predniSONE (DELTASONE) 5 MG tablet Take 5 mg by mouth daily with breakfast. As  needed for gout flare    . prochlorperazine (COMPAZINE) 10 MG tablet Take 1 tablet (10 mg total) by mouth every 6 (six) hours as needed (Nausea or vomiting). 30 tablet 1  . Thiamine HCl (VITAMIN B-1 PO) Take 100 mg by mouth daily.      No current facility-administered medications for this visit.   Facility-Administered Medications Ordered in Other Visits  Medication Dose Route Frequency Provider Last Rate Last Admin  . sodium chloride flush (NS) 0.9 % injection 10 mL  10 mL Intravenous PRN Earlie Server, MD   10 mL at 09/08/19 0842     PHYSICAL EXAMINATION: ECOG PERFORMANCE STATUS: 1 - Symptomatic but completely ambulatory Vitals:   09/08/19 0907  BP: 113/65  Pulse: 68  Resp: 18  Temp: 97.9 F (36.6 C)   Filed Weights   09/08/19 0907  Weight: 103 lb (46.7 kg)    Physical Exam Constitutional:      General: He is not in acute distress. HENT:     Head: Normocephalic and atraumatic.     Mouth/Throat:     Comments: Ritta Slot has resolved Eyes:     General: No scleral icterus. Neck:     Comments: Previously palpable right cervical mass has decreased in size and barely palpable. Cardiovascular:     Rate and Rhythm: Normal rate and regular rhythm.     Heart sounds: Normal heart sounds.  Pulmonary:     Effort: Pulmonary effort is normal. No respiratory distress.     Breath sounds: No wheezing.    Abdominal:     General: Bowel sounds are normal. There is no distension.     Palpations: Abdomen is soft.  Musculoskeletal:        General: No deformity. Normal range of motion.     Cervical back: Normal range of motion and neck supple.  Skin:    General: Skin is warm and dry.     Findings: No erythema or rash.  Neurological:     Mental Status: He is alert. Mental status is at baseline.     Cranial Nerves: No cranial nerve deficit.     Coordination: Coordination normal.     LABORATORY DATA:  I have reviewed the data as listed Lab Results  Component Value Date   WBC 1.6 (L) 09/01/2019   HGB 8.0 (L) 09/01/2019   HCT 23.1 (L) 09/01/2019   MCV 86.8 09/01/2019   PLT 131 (L) 09/01/2019   Recent Labs    08/25/19 0805 08/26/19 0804 09/01/19 0751  NA 138 138 135  K 3.4* 3.7 3.9  CL 103 106 105  CO2 26 23 21*  GLUCOSE 132* 117* 100*  BUN 33* 31* 19  CREATININE 1.08 0.99 0.86  CALCIUM 8.3* 8.3* 8.7*  GFRNONAA >60 >60 >60  GFRAA >60 >60 >60  PROT 6.7 6.8 6.9  ALBUMIN 3.1* 3.2* 3.2*  AST 22 19 16   ALT 20 20 16   ALKPHOS 53 52 55  BILITOT 0.9 0.7 0.7   Iron/TIBC/Ferritin/ %Sat No results found for: IRON, TIBC, FERRITIN, IRONPCTSAT    RADIOGRAPHIC STUDIES: I have personally reviewed the radiological images as listed and agreed with the findings in the report. No results found.US Guided Needle Placement  Result Date: 06/11/2019 INDICATION: PET positive right cervical necrotic adenopathy, concern for head neck squamous cell carcinoma EXAM: Ultrasound aspiration of the right cervical necrotic adenopathy for cytology Ultrasound core biopsy of the right cervical necrotic adenopathy for pathology MEDICATIONS: 1% lidocaine local ANESTHESIA/SEDATION: Moderate (conscious) sedation was  employed during this procedure. A total of Versed 1.0 mg and Fentanyl 50 mcg was administered intravenously. Moderate Sedation Time: 12 minutes. The patient's level of consciousness and vital signs were  monitored continuously by radiology nursing throughout the procedure under my direct supervision. FLUOROSCOPY TIME:  Fluoroscopy Time: None. COMPLICATIONS: None immediate. PROCEDURE: Informed written consent was obtained from the patient after a thorough discussion of the procedural risks, benefits and alternatives. All questions were addressed. Maximal Sterile Barrier Technique was utilized including caps, mask, sterile gowns, sterile gloves, sterile drape, hand hygiene and skin antiseptic. A timeout was performed prior to the initiation of the procedure. Previous imaging reviewed. Preliminary ultrasound performed. The right cervical abnormal adenopathy was localized and marked. Ultrasound aspiration: Under sterile conditions and local anesthesia, an 18 gauge needle was advanced into the cystic necrotic portion of the adenopathy. Syringe aspiration yielded 2 cc exudative fluid. Sample sent for cytology. Needle removed. Ultrasound core biopsy: Under sterile conditions and local anesthesia, an 18 gauge core biopsy needle was advanced to the residual component of the right cervical adenopathy. 18 gauge core biopsies obtained. These were placed in formalin. Needle removed. Postprocedure imaging demonstrates no hemorrhage or hematoma. Patient tolerated biopsy well. IMPRESSION: Successful ultrasound aspiration for cytology and core biopsy for pathology of the right neck necrotic cervical adenopathy. Electronically Signed   By: Jerilynn Mages.  Shick M.D.   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 Staging imaging as also demonstrated a roughly 1 cm cavitary nodule in the left upper lobe. The patient presents for biopsy to determine whether this represents metastatic disease, primary lung carcinoma or benign nodule. EXAM: CT GUIDED CORE BIOPSY OF LEFT UPPER  LOBE LUNG NODULE ANESTHESIA/SEDATION: 2.0 mg IV Versed; 100 mcg IV Fentanyl Total Moderate Sedation Time:  22 minutes. The patient's level of consciousness and physiologic status were continuously monitored during the procedure by Radiology nursing. PROCEDURE: The procedure risks, benefits, and alternatives were explained to the patient and his wife. Questions regarding the procedure were encouraged and answered. The patient and his wife understand and consent to the procedure. A time-out was performed prior to initiating the procedure. Supine imaging was performed through the upper to mid chest. The left upper chest wall was prepped with chlorhexidine in a sterile fashion, and a sterile drape was applied covering the operative field. A sterile gown and sterile gloves were used for the procedure. Local anesthesia was provided with 1% Lidocaine. Under CT guidance, a 17 gauge trocar needle was advanced to the level of a left upper lobe pulmonary nodule. After confirming needle tip position, 2 separate coaxial 18 gauge core biopsy samples were obtained. Additional imaging was performed. Aspiration was then performed via the outer needle at the level of the pleural space. Additional CT was performed after needle removal. COMPLICATIONS: Tiny left lateral pneumothorax. SIR level B: Nominal therapy (including overnight admission for observation), no consequence. FINDINGS: Small lateral and posterior left upper lobe nodule again demonstrated which measures approximately 9 x 12 mm. This nodule demonstrates central cavitation. One small tissue fragment and a second more intact core biopsy sample were able to be obtained. During the procedure, a small left lateral pneumothorax was sustained. This was treated with aspiration via the outer needle upon retraction resulting in complete aspiration and decompression of the pneumothorax. IMPRESSION: CT-guided core biopsy of cavitary left upper lobe lung  nodule measuring approximately  9 x 12 mm by CT today. The procedure was complicated by a tiny lateral pneumothorax treated by needle aspiration which resulted in complete evacuation. A follow-up chest x-ray will be performed during recovery. Electronically Signed   By: Aletta Edouard M.D.   On: 06/29/2019 11:17   DG Chest Port 1 View  Result Date: 06/29/2019 CLINICAL DATA:  Status post percutaneous biopsy of left upper lobe lung nodule with aspiration small left pneumothorax at the time of the procedure. EXAM: PORTABLE CHEST 1 VIEW COMPARISON:  Imaging during CT-guided lung biopsy earlier today FINDINGS: The heart size and mediastinal contours are within normal limits. No pneumothorax identified. Small peripheral left upper lung opacity corresponds to the nodule sampled earlier today. No evidence of pleural fluid, consolidation or edema. The visualized skeletal structures are unremarkable. IMPRESSION: No pneumothorax after left upper lobe lung biopsy. Electronically Signed   By: Aletta Edouard M.D.   On: 06/29/2019 13:07   [place] CORE BIOPSY (LYMPH NODES)  Result Date: 06/11/2019 INDICATION: PET positive right cervical necrotic adenopathy, concern for head neck squamous cell carcinoma EXAM: Ultrasound aspiration of the right cervical necrotic adenopathy for cytology Ultrasound core biopsy of the right cervical necrotic adenopathy for pathology MEDICATIONS: 1% lidocaine local ANESTHESIA/SEDATION: Moderate (conscious) sedation was employed during this procedure. A total of Versed 1.0 mg and Fentanyl 50 mcg was administered intravenously. Moderate Sedation Time: 12 minutes. The patient's level of consciousness and vital signs were monitored continuously by radiology nursing throughout the procedure under my direct supervision. FLUOROSCOPY TIME:  Fluoroscopy Time: None. COMPLICATIONS: None immediate. PROCEDURE: Informed written consent was obtained from the patient after a thorough discussion of the procedural risks, benefits and alternatives.  All questions were addressed. Maximal Sterile Barrier Technique was utilized including caps, mask, sterile gowns, sterile gloves, sterile drape, hand hygiene and skin antiseptic. A timeout was performed prior to the initiation of the procedure. Previous imaging reviewed. Preliminary ultrasound performed. The right cervical abnormal adenopathy was localized and marked. Ultrasound aspiration: Under sterile conditions and local anesthesia, an 18 gauge needle was advanced into the cystic necrotic portion of the adenopathy. Syringe aspiration yielded 2 cc exudative fluid. Sample sent for cytology. Needle removed. Ultrasound core biopsy: Under sterile conditions and local anesthesia, an 18 gauge core biopsy needle was advanced to the residual component of the right cervical adenopathy. 18 gauge core biopsies obtained. These were placed in formalin. Needle removed. Postprocedure imaging demonstrates no hemorrhage or hematoma. Patient tolerated biopsy well. IMPRESSION: Successful ultrasound aspiration for cytology and core biopsy for pathology of the right neck necrotic cervical adenopathy. Electronically Signed   By: Jerilynn Mages.  Shick M.D.   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 Port-A-Cath in place   4. Hypomagnesemia   5. 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 He tolerates with moderate  difficulties. Patient follows with Dr.  Chrystal in August. #Sore throat due to radiation mucositis. Continue Magic mouthwash 3-4 x30 minutes prior to each meal. Patient will receive IV fluid monitor today.  #Malnutrition, weight loss, recently weight has been stable.  Continue nutritional supplementation.  ##Lung lesion biopsy showed squamous cell carcinoma.-He will proceed with SBRT in the future  #Severe chronic hypomagnesemia, secondary to chronic alcoholism as well as recent cisplatin use. Magnesium level is 1.6 today.  Advised patient to continue taking Slow-Mag 2 tablets daily.  I will give him 2 g of IV magnesium sulfate today.  #Hypokalemia, potassium has normalized.   #Weight loss, weight is relatively stable.  Continue nutrition supplementation. #Sore throat, likely mucositis secondary to radiation.  Recommend patient to discuss lidocaine swish and spit.  # Chronic alcohol use, per patient he has stopped drinking alcohol.  We discussed about feeding tube placement prior to the treatments All questions were answered. The patient knows to call the clinic with any problems questions or concerns.   Return of visit: 2 months.  Earlie Server, MD, PhD Hematology Oncology [facility] at [facility] Pager- [phone] 09/08/2019

## 2019-09-12 ENCOUNTER — Other Ambulatory Visit: Payer: Self-pay | Admitting: Oncology

## 2019-09-17 ENCOUNTER — Other Ambulatory Visit: Payer: Self-pay | Admitting: Oncology

## 2019-09-23 ENCOUNTER — Inpatient Hospital Stay: Payer: Medicare Other

## 2019-09-23 NOTE — Progress Notes (Signed)
Nutrition Follow-up:  Patient with base of the tongue cancer extending to right priform sinus stage IV.  Patient has completed concurrent chemotherapy and radiation therapy.    Spoke with patient and wife via phone.  Patient reports that he is 50/50.  Reports ate egg, toast with cheese, drank juice and ensure this am for breakfast.  Last night for dinner ate few bites of pork chop (cut small), potatoes and green beans.  Reports that he is drinking about 3 ensure shake per day.  Reports that he can taste food but not as strong as before treatment.  Patient is able to swallow but painful and cuts foods up in small pieces.      Medications: reviewed  Labs: reviewed  Anthropometrics:   Weight on home scale 102 lb per wife.    103 lb 9.6 oz on 6/30 106 lb 3/2 oz on 6/23 108 lb 6/16 112 lb on 5/24   NUTRITION DIAGNOSIS:  Inadequate oral intake continues   INTERVENTION:  Continue ensure enlive 3-4 daily. Continue to push high calorie, high protein foods Chop foods well and add in gravy, sauce liquid for ease of swallowing Patient has contact information    MONITORING, EVALUATION, GOAL: weight trends, intake   NEXT VISIT: Thursday, August 12 after radiation f/u  Gregory Burton, [place], [place] Registered Dietitian [phone] (mobile)

## 2019-10-07 ENCOUNTER — Ambulatory Visit
Admission: RE | Admit: 2019-10-07 | Discharge: 2019-10-07 | Disposition: A | Discharge status: Home / Self Care | Payer: Medicare Other | Source: Ambulatory Visit | Attending: Radiation Oncology | Admitting: Radiation Oncology

## 2019-10-07 ENCOUNTER — Other Ambulatory Visit: Payer: Self-pay

## 2019-10-07 ENCOUNTER — Other Ambulatory Visit: Payer: Self-pay | Admitting: *Deleted

## 2019-10-07 ENCOUNTER — Encounter: Payer: Self-pay | Admitting: Radiation Oncology

## 2019-10-07 ENCOUNTER — Inpatient Hospital Stay: Payer: Medicare Other | Attending: Oncology

## 2019-10-07 VITALS — Temp 97.0°F | Wt 106.0 lb

## 2019-10-07 DIAGNOSIS — C109 Malignant neoplasm of oropharynx, unspecified: Secondary | ICD-10-CM

## 2019-10-07 DIAGNOSIS — Z923 Personal history of irradiation: Secondary | ICD-10-CM | POA: Diagnosis not present

## 2019-10-07 DIAGNOSIS — C7802 Secondary malignant neoplasm of left lung: Secondary | ICD-10-CM | POA: Insufficient documentation

## 2019-10-07 DIAGNOSIS — C01 Malignant neoplasm of base of tongue: Secondary | ICD-10-CM | POA: Diagnosis not present

## 2019-10-07 DIAGNOSIS — R911 Solitary pulmonary nodule: Secondary | ICD-10-CM

## 2019-10-07 NOTE — Progress Notes (Signed)
Radiation Oncology Follow up Note  Name: Gregory Burton   Date:   10/07/2019 MRN:  [id number] DOB: Aug 29, 1940    This 79 y.o. 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  REFERRING PROVIDER: Casilda Carls, MD  HPI: Patient is a 79 year old male now at 1 month having completed concurrent chemoradiation therapy for stage IVb squamous cell carcinoma the base of tongue seen today in routine follow-up he is doing well specifically [place] head and neck pain dysphagia.  His p.o. intake is good.  He has been back to see ENT which the patient.  States they found no evidence of disease.  Patient does have a small left midlung field lesion which has been biopsied and is a squamous cell carcinoma.  Is extremely hard to visualize at the current size.  COMPLICATIONS OF TREATMENT: none  FOLLOW UP COMPLIANCE: keeps appointments   PHYSICAL EXAM:  Temp (!) 97 F (36.1 C) (Tympanic)   Wt 106 lb (48.1 kg)   BMI 16.60 kg/m  No evidence of cervical or supraclavicular adenopathy.  Well-developed well-nourished patient in NAD. HEENT reveals PERLA, EOMI, discs not visualized.  Oral cavity is clear. No oral mucosal lesions are identified. Neck is clear without evidence of cervical or supraclavicular adenopathy. Lungs are clear to A&P. Cardiac examination is essentially unremarkable with regular rate and rhythm without murmur rub or thrill. Abdomen is benign with no organomegaly or masses noted. Motor sensory and DTR levels are equal and symmetric in the upper and lower extremities. Cranial nerves II through XII are grossly intact. Proprioception is intact. No peripheral adenopathy or edema is identified. No motor or sensory levels are noted. Crude visual fields are within normal range.  RADIOLOGY RESULTS: CT scan of the chest is again reviewed showing  left midlung field lesion  PLAN: At this time elected with another 3 months and I would use SBRT on this left peripheral lung lesion as below the patient to increase his nutritional intake.  I will get a CT scan at that time both of his head and neck and chest and start planning the SBRT treatments at that time.  Family is fine with my current treatment recommendations.  I encouraged him to increase his nutritional intake.  Follow-up and scans were ordered.  I would like to take this opportunity to thank you for allowing me to participate in the care of your patient.Noreene Filbert, MD

## 2019-10-07 NOTE — Progress Notes (Addendum)
Nutrition Follow-up:  Patient with base of the tongue cancer extending to right priform sinus stage IV.  Patient has completed chemotherapy and radiation therapy.   Met with patient following radiation MD visit.  Patient reports that appetite is getting some better.  Wife reports that at recent PCP visit K was 5.3 and PCP wanted patient to avoid eating foods high in potassium. Wife has a list.  MD wanted patient to drop down to 2 ensure per day.  Planning repeat blood draw on 9/13 at PCP office    Medications: reviewed  Labs: no new  Anthropometrics:   Weight increased to 106 lb today from 103 lb on 6/30  112 lb on 5/24   NUTRITION DIAGNOSIS: Inadequate oral intake continues   INTERVENTION:  Reviewed foods high in potassium and moderate intake until repeat blood draw.  Discussed strategies to increase calories as reducing intake of ensure and potatoes (pt's favorite) Complimentary case of ensure enlive given to patient today.      MONITORING, EVALUATION, GOAL: weight trends, intake   NEXT VISIT: Sept 20 during infusion  Gregory Burton B. Zenia Resides, [place], [place] Registered Dietitian [phone] (mobile)

## 2019-10-08 ENCOUNTER — Other Ambulatory Visit: Payer: Self-pay | Admitting: Oncology

## 2019-10-24 ENCOUNTER — Other Ambulatory Visit: Payer: Self-pay | Admitting: Oncology

## 2019-11-05 ENCOUNTER — Other Ambulatory Visit: Payer: Self-pay | Admitting: Oncology

## 2019-11-15 ENCOUNTER — Telehealth: Payer: Self-pay

## 2019-11-15 ENCOUNTER — Other Ambulatory Visit: Payer: Self-pay

## 2019-11-15 ENCOUNTER — Inpatient Hospital Stay: Payer: Medicare Other | Attending: Oncology

## 2019-11-15 ENCOUNTER — Inpatient Hospital Stay: Payer: Medicare Other

## 2019-11-15 ENCOUNTER — Encounter: Payer: Self-pay | Admitting: Oncology

## 2019-11-15 ENCOUNTER — Inpatient Hospital Stay (HOSPITAL_BASED_OUTPATIENT_CLINIC_OR_DEPARTMENT_OTHER): Payer: Medicare Other | Admitting: Oncology

## 2019-11-15 VITALS — BP 136/66 | HR 66 | Temp 95.6°F | Resp 18 | Wt 110.8 lb

## 2019-11-15 DIAGNOSIS — Z7189 Other specified counseling: Secondary | ICD-10-CM

## 2019-11-15 DIAGNOSIS — C3492 Malignant neoplasm of unspecified part of left bronchus or lung: Secondary | ICD-10-CM

## 2019-11-15 DIAGNOSIS — C109 Malignant neoplasm of oropharynx, unspecified: Secondary | ICD-10-CM

## 2019-11-15 DIAGNOSIS — R300 Dysuria: Secondary | ICD-10-CM | POA: Diagnosis not present

## 2019-11-15 DIAGNOSIS — F1010 Alcohol abuse, uncomplicated: Secondary | ICD-10-CM

## 2019-11-15 LAB — URINALYSIS, DIPSTICK ONLY
Bilirubin Urine: NEGATIVE
Glucose, UA: NEGATIVE mg/dL
Hgb urine dipstick: NEGATIVE
Ketones, ur: NEGATIVE mg/dL
Leukocytes,Ua: NEGATIVE
Nitrite: NEGATIVE
Protein, ur: NEGATIVE mg/dL
Specific Gravity, Urine: 1.02 (ref 1.005–1.030)
pH: 5 (ref 5.0–8.0)

## 2019-11-15 LAB — COMPREHENSIVE METABOLIC PANEL
ALT: 19 U/L (ref 0–44)
AST: 17 U/L (ref 15–41)
Albumin: 3.9 g/dL (ref 3.5–5.0)
Alkaline Phosphatase: 41 U/L (ref 38–126)
Anion gap: 8 (ref 5–15)
BUN: 19 mg/dL (ref 8–23)
CO2: 24 mmol/L (ref 22–32)
Calcium: 9.2 mg/dL (ref 8.9–10.3)
Chloride: 109 mmol/L (ref 98–111)
Creatinine, Ser: 0.88 mg/dL (ref 0.61–1.24)
GFR calc Af Amer: >60 mL/min (ref >60)
GFR calc non Af Amer: >60 mL/min (ref >60)
Glucose, Bld: 98 mg/dL (ref 70–99)
Potassium: 4.1 mmol/L (ref 3.5–5.1)
Sodium: 141 mmol/L (ref 135–145)
Total Bilirubin: 1 mg/dL (ref 0.3–1.2)
Total Protein: 7 g/dL (ref 6.5–8.1)

## 2019-11-15 LAB — CBC WITH DIFFERENTIAL/PLATELET
Abs Immature Granulocytes: 0.01 10*3/uL (ref 0.00–0.07)
Basophils Absolute: 0 10*3/uL (ref 0.0–0.1)
Basophils Relative: 0 %
Eosinophils Absolute: 0.1 10*3/uL (ref 0.0–0.5)
Eosinophils Relative: 1 %
HCT: 32.1 % — ABNORMAL LOW (ref 39.0–52.0)
Hemoglobin: 10.6 g/dL — ABNORMAL LOW (ref 13.0–17.0)
Immature Granulocytes: 0 %
Lymphocytes Relative: 15 %
Lymphs Abs: 0.6 10*3/uL — ABNORMAL LOW (ref 0.7–4.0)
MCH: 31.4 pg (ref 26.0–34.0)
MCHC: 33 g/dL (ref 30.0–36.0)
MCV: 95 fL (ref 80.0–100.0)
Monocytes Absolute: 0.4 10*3/uL (ref 0.1–1.0)
Monocytes Relative: 10 %
Neutro Abs: 3 10*3/uL (ref 1.7–7.7)
Neutrophils Relative %: 74 %
Platelets: 169 10*3/uL (ref 150–400)
RBC: 3.38 MIL/uL — ABNORMAL LOW (ref 4.22–5.81)
RDW: 12.5 % (ref 11.5–15.5)
WBC: 4.1 10*3/uL (ref 4.0–10.5)
nRBC: 0 % (ref 0.0–0.2)

## 2019-11-15 LAB — MAGNESIUM: Magnesium: 1.6 mg/dL — ABNORMAL LOW (ref 1.7–2.4)

## 2019-11-15 MED ORDER — MAGNESIUM CHLORIDE 64 MG PO TBEC: 1.0000 | DELAYED_RELEASE_TABLET | Freq: Two times a day (BID) | ORAL | 2 refills | Status: DC

## 2019-11-15 MED ORDER — SODIUM CHLORIDE 0.9 % IV SOLN
Freq: Once | INTRAVENOUS | Status: AC
  Filled 2019-11-15: qty 250

## 2019-11-15 MED ORDER — HEPARIN SOD (PORK) LOCK FLUSH 100 UNIT/ML IV SOLN
500.0000 [IU] | Freq: Once | INTRAVENOUS | Status: AC | PRN
  Administered 2019-11-15: 500 [IU]
  Filled 2019-11-15: qty 5

## 2019-11-15 MED ORDER — MAGNESIUM SULFATE 2 GM/50ML IV SOLN
2.0000 g | Freq: Once | INTRAVENOUS | Status: AC
  Administered 2019-11-15: 2 g via INTRAVENOUS
  Filled 2019-11-15: qty 50

## 2019-11-15 NOTE — Progress Notes (Signed)
Nutrition Follow-up:   Patient with base of tongue cancer extending to right priform sinus stage IV.  Patient has completed chemotherapy and radiation therapy.    Met with patient in infusion.  Patient reports appetite is so-so.  Reports that he is eating all consistencies of foods (chicken, fish, vegetables) without difficulty.  Continues to drink ensure shakes daily.  Likes potatoes but wife is not letting him eat very many of those.  K has been elevated in the past.     Medications: reviewed  Labs: K 4.1 (WNL)  Anthropometrics:   Weight increased to 110 lb from 106 lb.   112 lb on 5/24   NUTRITION DIAGNOSIS: Inadequate oral intake improved   INTERVENTION:  Discussed with wife liberalizing foods with excess potassium with K being WNL today.  Complimentary case of ensure enlive given to patient today.   Wife has contact information   NEXT VISIT: no follow-up, wife to contact RD as needed  Gregory Burton, [place], [place] Registered Dietitian [phone] (mobile)

## 2019-11-15 NOTE — Progress Notes (Signed)
Hematology/Oncology progress note [facility] [phone] [phone]   Patient Care Team: Casilda Carls, MD as PCP - General (Internal Medicine) Earlie Server, [name], [name]  HISTORY OF PRESENTING ILLNESS:  Gregory Burton is a  79 y.o.  male with PMH listed below was seen in consultation at the request of  Casilda Carls, MD  for evaluation of lymph node.  Patient moved from [place] to [place] for about 3 weeks. He has noticed a neck knot on the right side of his neck. The knot is sore and makes him to cough and he feels it when he swallows. No swallowing difficulty.  Patient was accompanied by his wife. She reports that patient's previous PCP has done neck sonogram for evaluation.  Patient also had right lower molar tooth extraction done a few weeks before he noticed  He also took a course of antibiotics.  Due to his tooth pain, his appetite has decreased and he has lost weight loss, about 20 pounds, he can not specify the time frame.   Alcoholism History of prostate cancer. S/p Radiation/seed, he follows up with [facility].   INTERVAL HISTORY Gregory Burton is a 79 y.o. male who has above history reviewed by me today presents for follow-up of treatment. He has finished concurrent chemotherapy and radiation. He was accompanied by his wife.  Patient reports feeling well.  He has gained 7 pounds since last visit. Patient reports dysuria and weak stream.  He has a history of prostate cancer that was treated in 2008.  Review of Systems  Constitutional: Negative for appetite change, chills, fatigue and fever.  HENT:   Negative for hearing loss and voice change.   Eyes: Negative for eye problems and icterus.  Respiratory: Negative for chest tightness, cough and shortness of breath.   Cardiovascular:  Negative for chest pain and leg swelling.  Gastrointestinal: Negative for abdominal distention, abdominal pain, blood in stool and nausea.  Endocrine: Negative for hot flashes.  Genitourinary: Negative for difficulty urinating, dysuria and frequency.   Musculoskeletal: Negative for arthralgias.  Skin: Negative for itching and rash.  Neurological: Negative for extremity weakness, light-headedness and numbness.  Hematological: Negative for adenopathy. Does not bruise/bleed easily.  Psychiatric/Behavioral: Negative for confusion.       Forgetful    MEDICAL HISTORY:  Past Medical History:  Diagnosis Date   Alcohol abuse    Blood transfusion without reported diagnosis 2013   Cataract    Dementia ([place])    Glaucoma    Gout    Hypercholesteremia    Hypertension    Prostate CA ([place]) 2008   Squamous cell lung cancer ([place]) 06/23/2019    SURGICAL HISTORY: Past Surgical History:  Procedure Laterality Date   BRAIN SURGERY  2012   HERNIA REPAIR     PORTA CATH INSERTION N/A 07/27/2019   Procedure: PORTA CATH INSERTION;  Surgeon: Katha Cabal, MD;  Location: [facility];  Service: Cardiovascular;  Laterality: N/A;    SOCIAL HISTORY: Social History   Socioeconomic History   Marital status: Married    Spouse name: Not on file   Number of children: Not on file   Years of education: Not on file   Highest education level: Not on file  Occupational History   Not on file  Tobacco Use   Smoking status: Former Smoker  Years: 21.00    Types: Cigarettes    Quit date: 05/11/2008    Years since quitting: 11.5   Smokeless tobacco: Never Used  Vaping Use   Vaping Use: Never used  Substance and Sexual Activity   Alcohol use: Yes    Comment: 0.5 pint liquor a week   Drug use: Never   Sexual activity: Not Currently  Other Topics Concern   Not on file  Social History Narrative   Lives at home with wife. Wife states he has some dementia but is able to  sign his own consent.   Social Determinants of Health   Financial Resource Strain:    Difficulty of Paying Living Expenses: Not on file  Food Insecurity:    Worried About Charity fundraiser in the Last Year: Not on file   [employer] of Food in the Last Year: Not on file  Transportation Needs:    Lack of Transportation (Medical): Not on file   Lack of Transportation (Non-Medical): Not on file  Physical Activity:    Days of Exercise per Week: Not on file   Minutes of Exercise per Session: Not on file  Stress:    Feeling of Stress : Not on file  Social Connections:    Frequency of Communication with Friends and Family: Not on file   Frequency of Social Gatherings with Friends and Family: Not on file   Attends Religious Services: Not on file   Active Member of Clubs or Organizations: Not on file   Attends Archivist Meetings: Not on file   Marital Status: Not on file  Intimate Partner Violence:    Fear of Current or Ex-Partner: Not on file   Emotionally Abused: Not on file   Physically Abused: Not on file   Sexually Abused: Not on file    FAMILY HISTORY: History reviewed. No pertinent family history.  ALLERGIES:  is allergic to enalapril.  MEDICATIONS:  Current Outpatient Medications  Medication Sig Dispense Refill   atorvastatin (LIPITOR) 20 MG tablet Take 20 mg by mouth daily.     donepezil (ARICEPT) 10 MG tablet Take 10 mg by mouth at bedtime.     dorzolamide-timolol (COSOPT) 22.3-6.8 MG/ML ophthalmic solution Place 1 drop into both eyes 2 (two) times daily.      folic acid (FOLVITE) 1 MG tablet Take 1 mg by mouth daily.     latanoprost (XALATAN) 0.005 % ophthalmic solution Place 1 drop into both eyes at bedtime.      lidocaine-prilocaine (EMLA) cream Apply 1 application topically as needed. Apply small amount to port site approx 1-2 hours prior to appointment. 30 g 1   magnesium chloride (SLOW-MAG) 64 MG TBEC SR tablet Take 1 tablet (64 mg  total) by mouth 2 (two) times daily. 60 tablet 2   megestrol (MEGACE) 40 MG tablet TAKE 1 TABLET BY MOUTH TWICE A DAY 60 tablet 0   Thiamine HCl (VITAMIN B-1 PO) Take 100 mg by mouth daily.      MAG64 64 MG TBEC TAKE 1 TABLET (64 MG TOTAL) BY MOUTH 2 (TWO) TIMES DAILY. (Patient not taking: Reported on 11/15/2019) 60 tablet 1   magic mouthwash w/lidocaine SOLN Take 5 mLs by mouth 4 (four) times daily as needed for mouth pain. Sig: Swish/Swallow 5-10 ml four times a day as needed. Dispense 480 ml. 1RF (Patient not taking: Reported on 09/08/2019) 480 mL 1   prednisoLONE 5 MG TABS tablet Take 5 mg by mouth daily as needed (  Gout). (Patient not taking: Reported on 09/08/2019)     predniSONE (DELTASONE) 5 MG tablet Take 5 mg by mouth daily with breakfast. As needed for gout flare (Patient not taking: Reported on 09/08/2019)     prochlorperazine (COMPAZINE) 10 MG tablet Take 1 tablet (10 mg total) by mouth every 6 (six) hours as needed (Nausea or vomiting). (Patient not taking: Reported on 10/07/2019) 30 tablet 1   No current facility-administered medications for this visit.     PHYSICAL EXAMINATION: ECOG PERFORMANCE STATUS: 1 - Symptomatic but completely ambulatory Vitals:   11/15/19 0903  BP: 136/66  Pulse: 66  Resp: 18  Temp: (!) 95.6 F (35.3 C)  SpO2: 92%   Filed Weights   11/15/19 0903  Weight: 110 lb 12.8 oz (50.3 kg)    Physical Exam Constitutional:      General: He is not in acute distress. HENT:     Head: Normocephalic and atraumatic.     Mouth/Throat:     Comments: Ritta Slot has resolved Eyes:     General: No scleral icterus. Neck:     Comments: Previously right cervical lymphadenopathy no longer palpable Cardiovascular:     Rate and Rhythm: Normal rate and regular rhythm.     Heart sounds: Normal heart sounds.  Pulmonary:     Effort: Pulmonary effort is normal. No respiratory distress.     Breath sounds: No wheezing.  Abdominal:     General: Bowel sounds are normal.  There is no distension.     Palpations: Abdomen is soft.  Musculoskeletal:        General: No deformity. Normal range of motion.     Cervical back: Normal range of motion and neck supple.  Skin:    General: Skin is warm and dry.     Findings: No erythema or rash.  Neurological:     Mental Status: He is alert. Mental status is at baseline.     Cranial Nerves: No cranial nerve deficit.     Coordination: Coordination normal.     LABORATORY DATA:  I have reviewed the data as listed Lab Results  Component Value Date   WBC 4.1 11/15/2019   HGB 10.6 (L) 11/15/2019   HCT 32.1 (L) 11/15/2019   MCV 95.0 11/15/2019   PLT 169 11/15/2019   Recent Labs    09/01/19 0751 09/08/19 0832 11/15/19 0818  NA 135 140 141  K 3.9 4.2 4.1  CL 105 107 109  CO2 21* 25 24  GLUCOSE 100* 125* 98  BUN 19 23 19   CREATININE 0.86 0.98 0.88  CALCIUM 8.7* 9.0 9.2  GFRNONAA >60 >60 >60  GFRAA >60 >60 >60  PROT 6.9 6.8 7.0  ALBUMIN 3.2* 3.2* 3.9  AST 16 16 17   ALT 16 13 19   ALKPHOS 55 56 41  BILITOT 0.7 0.5 1.0   Iron/TIBC/Ferritin/ %Sat No results found for: IRON, TIBC, FERRITIN, IRONPCTSAT    RADIOGRAPHIC STUDIES: I have personally reviewed the radiological images as listed and agreed with the findings in the report. No results found.No results found.     ASSESSMENT & PLAN:  1. Dysuria   2. 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  Finished on 09/07/2019. Labs are reviewed and discussed with patient. Counts has improved. He has also gained weight Clinically doing very well today. Physical examination showed significant improvement and complete resolution of right cervical lymphadenopathy.  Continue follow-up with Dr. Baruch Gouty for SBRT of lung lesion. Dr. Baruch Gouty has ordered CT scan to be done in October.  Follow-up with me in 3 months.  #Dysuria, check UA and urine culture. #Severe chronic hypomagnesemia, secondary to chronic alcoholism as well as recent cisplatin use. Magnesium level is 1.6 which appears to be his chronic baseline.  Continue Slow-Mag 64 mg twice daily. Patient will proceed with IV magnesium 2 g x 1 today We discussed about feeding tube placement prior to the treatments All questions were answered. The patient knows to call the clinic with any problems questions or concerns.   Return of visit: 3 months.  Earlie Server, MD, PhD Hematology Oncology [facility] Pager- [phone] 11/15/2019

## 2019-11-15 NOTE — Progress Notes (Signed)
Patient here for oncology follow-up appointment, expresses complaints of urinary burning and weakness.

## 2019-11-16 ENCOUNTER — Other Ambulatory Visit: Payer: Self-pay | Admitting: Oncology

## 2019-11-16 LAB — URINE CULTURE: Culture: NO GROWTH

## 2019-11-16 NOTE — Telephone Encounter (Signed)
Notes  Ref Range & Units 1 d ago 2 mo ago  Magnesium 1.7 - 2.4 mg/dL 1.6Low  1.6Low CM   Comment: Performed at [facility], [address] [place], [place] [address]  Resulting Agency  [facility]      Specimen Collected: 11/15/19 08:18 Last Resulted: 11/15/19 08:48

## 2019-11-16 NOTE — Telephone Encounter (Signed)
Refill request

## 2019-12-10 ENCOUNTER — Other Ambulatory Visit: Payer: Self-pay | Admitting: Oncology

## 2019-12-30 ENCOUNTER — Telehealth: Payer: Self-pay | Admitting: Internal Medicine

## 2019-12-31 ENCOUNTER — Other Ambulatory Visit: Payer: Self-pay

## 2019-12-31 ENCOUNTER — Ambulatory Visit
Admission: RE | Admit: 2019-12-31 | Discharge: 2019-12-31 | Disposition: A | Discharge status: Home / Self Care | Payer: Medicare Other | Source: Ambulatory Visit | Attending: Radiation Oncology | Admitting: Radiation Oncology

## 2019-12-31 DIAGNOSIS — C109 Malignant neoplasm of oropharynx, unspecified: Secondary | ICD-10-CM | POA: Insufficient documentation

## 2019-12-31 DIAGNOSIS — R911 Solitary pulmonary nodule: Secondary | ICD-10-CM | POA: Diagnosis present

## 2019-12-31 LAB — POCT I-STAT CREATININE: Creatinine, Ser: 1 mg/dL (ref 0.61–1.24)

## 2019-12-31 IMAGING — CT CT NECK W/ CM
3 of 4 series · 12 of 35 positions shown, 14 images · IV contrast (omnipaque)
Comparison: [DATE]

CLINICAL DATA: Head neck cancer surveillance. Stage IVB squamous
cell carcinoma of the tongue base.

EXAM:
CT NECK WITH CONTRAST
TECHNIQUE: Multidetector CT imaging of the neck was performed using the
standard protocol following the bolus administration of intravenous
contrast.
CONTRAST:  75mL OMNIPAQUE IOHEXOL 300 MG/ML  SOLN

[Series 5: sag neck · sagittal · 0.46mm/px · 5 of 113 slices shown, 6 images]
[im 38/113  bone]
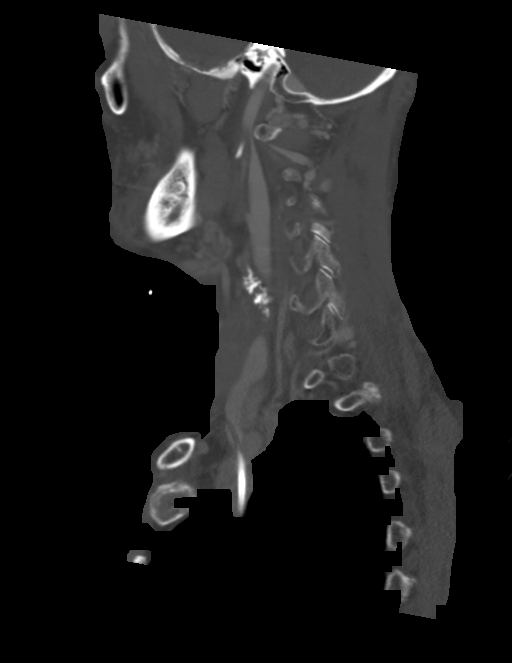
[im 47/113  bone]
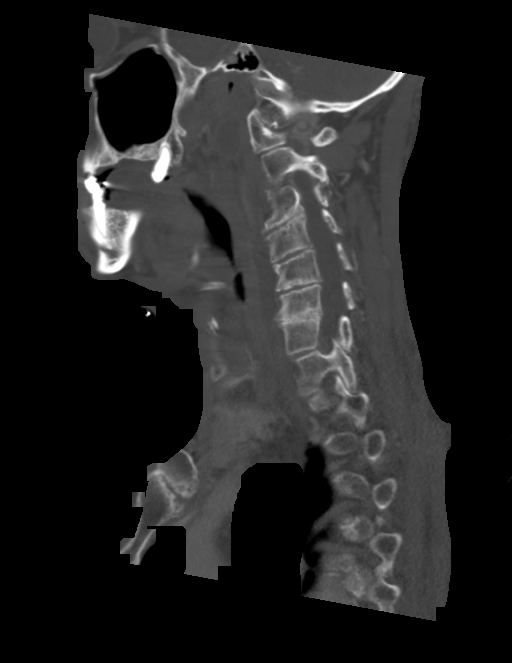
[im 57/113  soft-tissue]
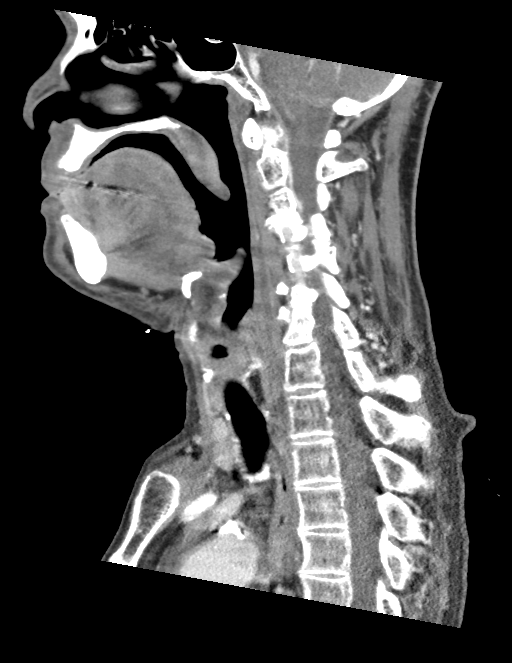
[im 57/113  bone]
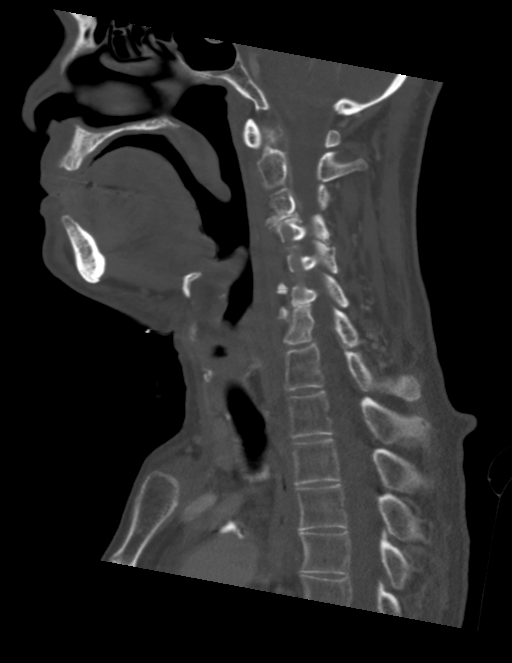
[im 66/113  bone]
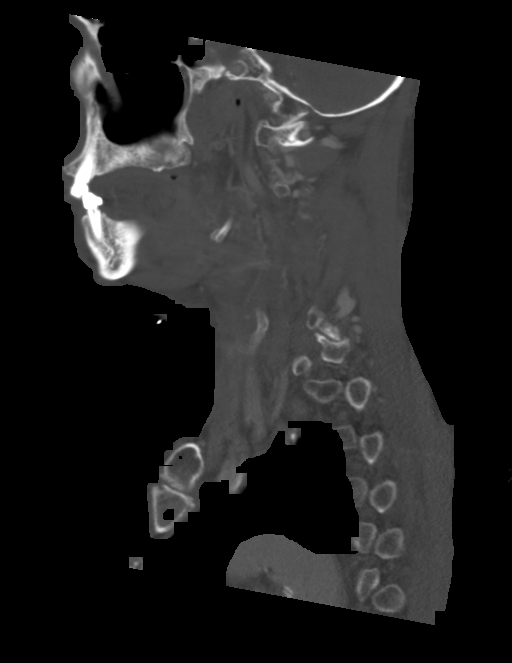
[im 75/113  bone]
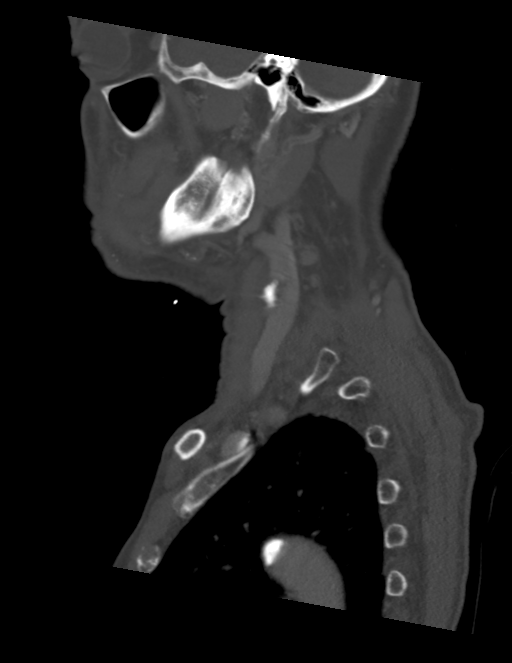

[Series 6: cor neck · coronal · 0.42mm/px · 3 of 97 slices shown]
[im 24/97  bone]
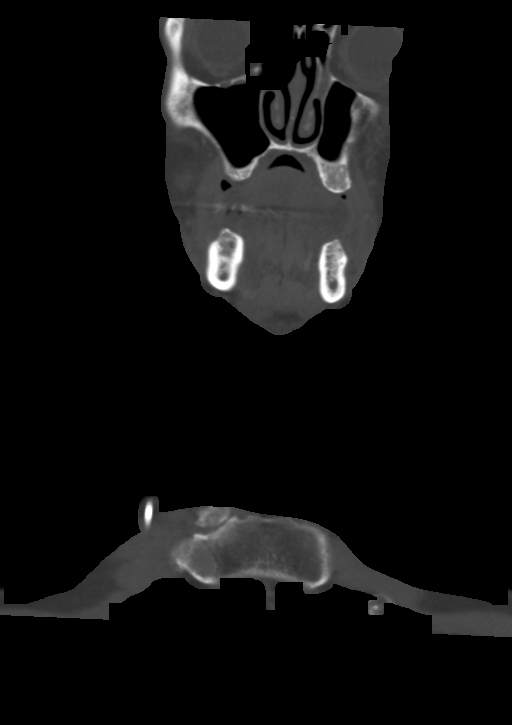
[im 40/97  bone]
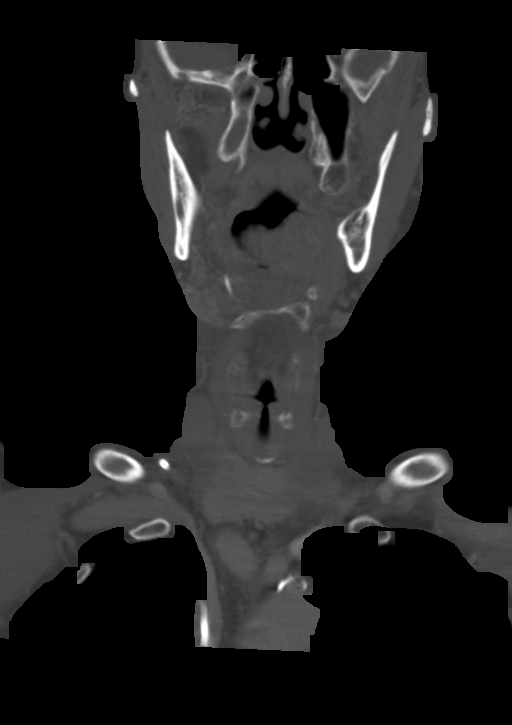
[im 57/97  bone]
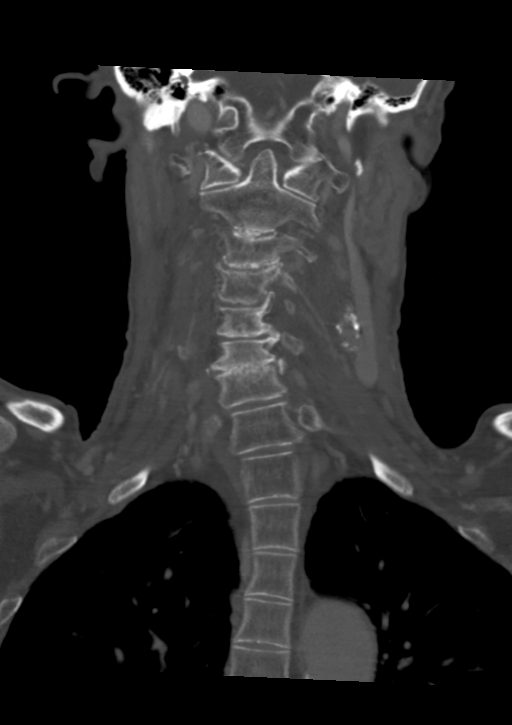

[Series 7: orthogonal ax · axial · 0.41mm/px · z∈[-304,-101]mm · 4 of 146 slices shown, 5 images]
[im 21/146  soft-tissue]
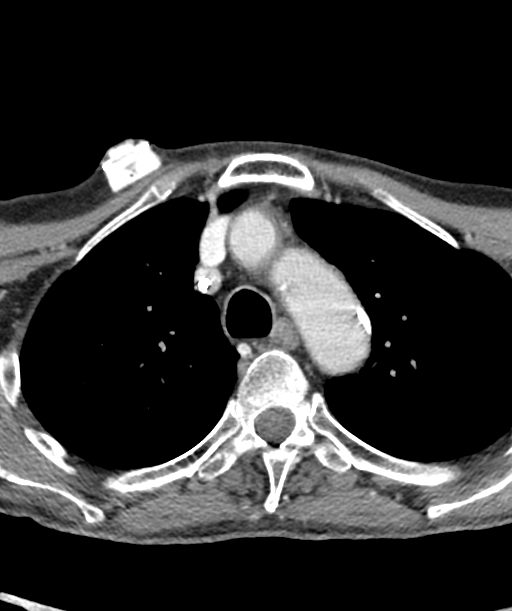
[im 21/146  bone]
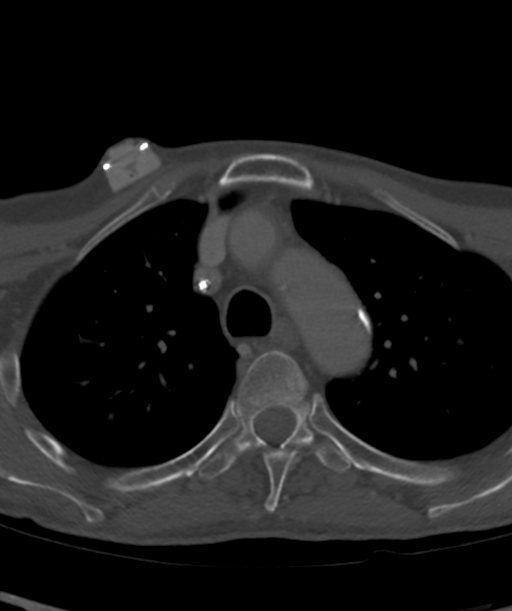
[im 63/146  bone]
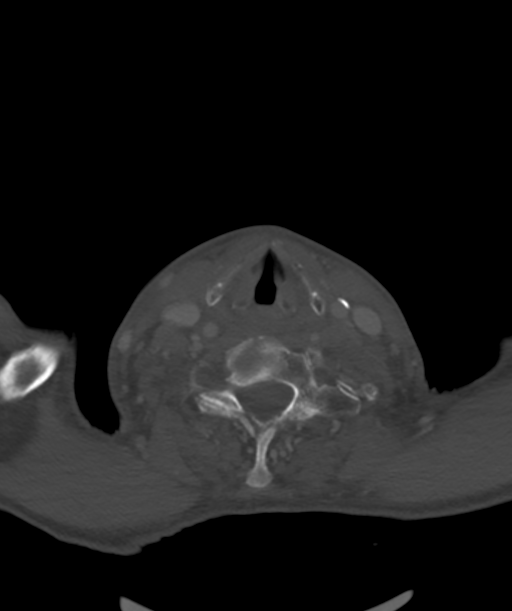
[im 83/146  bone]
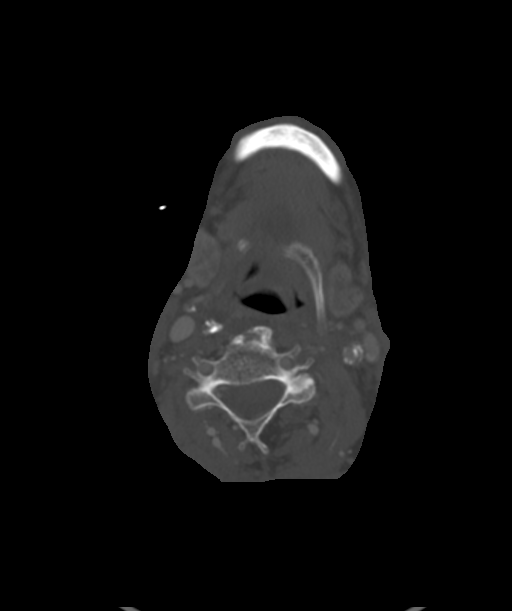
[im 125/146  bone]
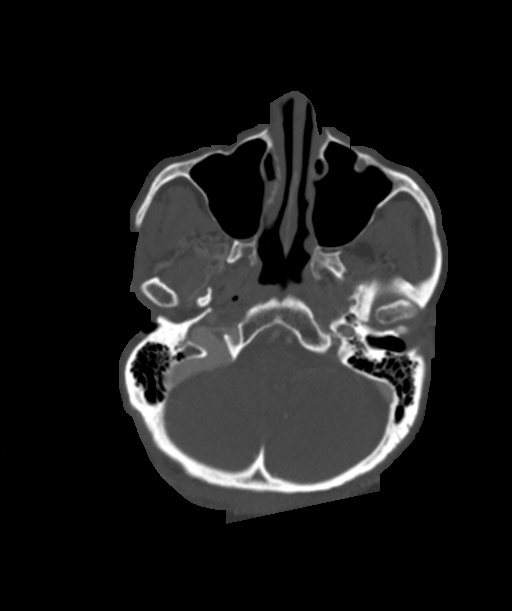

[12 of 35 positions shown; findings below may reference images not displayed]

FINDINGS: Pharynx and larynx: Submucosal low-density appearance consistent
with interval radiotherapy. No asymmetric masslike enhancement seen
today at the right piriform sinus. No metachronous tumor is seen.

Salivary glands: Post treatment volume loss and high-density.

Thyroid: Unremarkable

Lymph nodes: No detected change in a low-density center right
lateral retropharyngeal node measuring 18 mm. Right jugular chain
necrotic lymph nodes are no longer enlarged or low-density. No
interval contralateral adenopathy.

Vascular: Severely bulky atheromatous disease of the cervical
carotids with underfilling of the right ICA, unchanged. Unremarkable
right IJ porta catheter.

Limited intracranial: Negative

Visualized orbits: Negative

Mastoids and visualized paranasal sinuses: Clear

Skeleton: No bony erosion seen.  Diffuse degenerative disease.

Upper chest: Left upper lobe pulmonary nodule. Chest CT is reported
separately.
IMPRESSION: 1. No significant change in the right lateral retropharyngeal
adenopathy.
2. The primary tumor and right jugular adenopathy appears resolved.
3. Severe carotid atherosclerosis with underfilling of the right ICA
beyond bulky bifurcation disease.

## 2019-12-31 IMAGING — CT CT CHEST W/ CM
2 of 4 series · 15 of 36 positions shown, 18 images · IV contrast (omnipaque)
Comparison: PET of [DATE].  Clinic note of [DATE]

CLINICAL DATA: Lung nodule. Tongue carcinoma. Known squamous cell
carcinoma of left lung.

EXAM:
CT CHEST WITH CONTRAST
TECHNIQUE: Multidetector CT imaging of the chest was performed during
intravenous contrast administration.
CONTRAST:  75mL OMNIPAQUE IOHEXOL 300 MG/ML  SOLN

[Series 2: axial st · axial · 0.50mm/px · z∈[-469,-213]mm · 12 of 152 slices shown, 15 images]
[im 12/152  mediastinal]
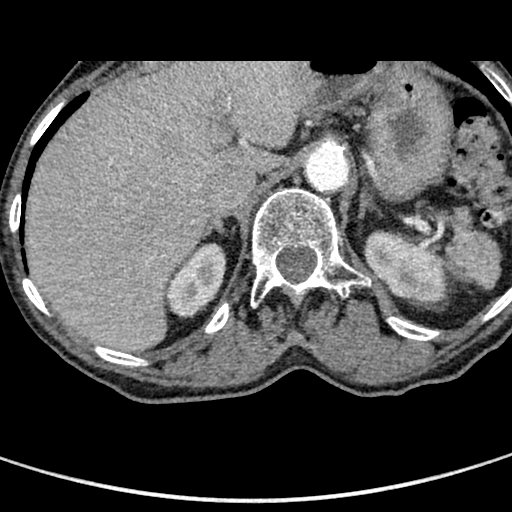
[im 12/152  lung]
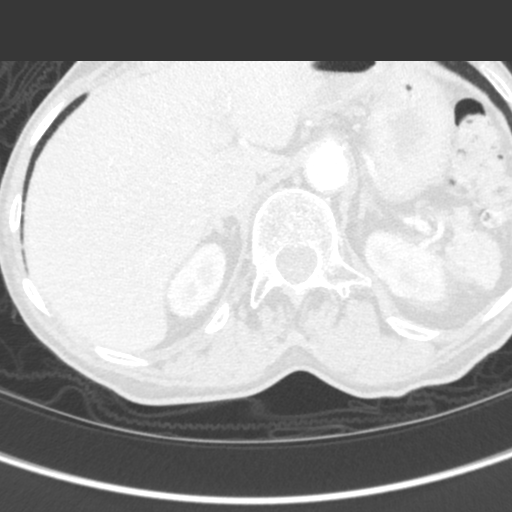
[im 24/152  lung]
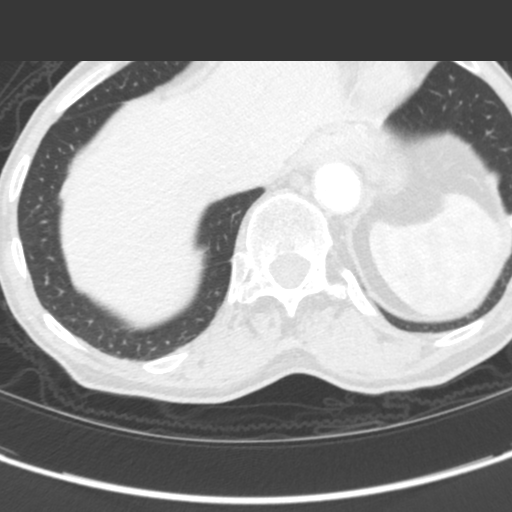
[im 35/152  lung]
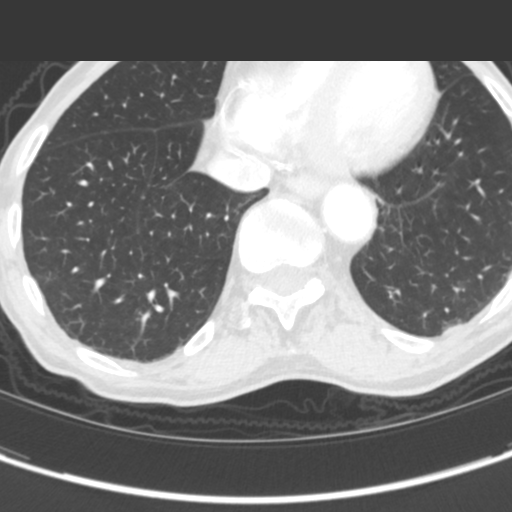
[im 47/152  lung]
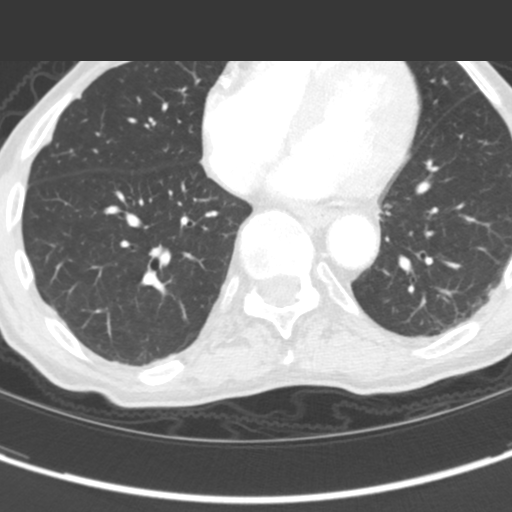
[im 59/152  mediastinal]
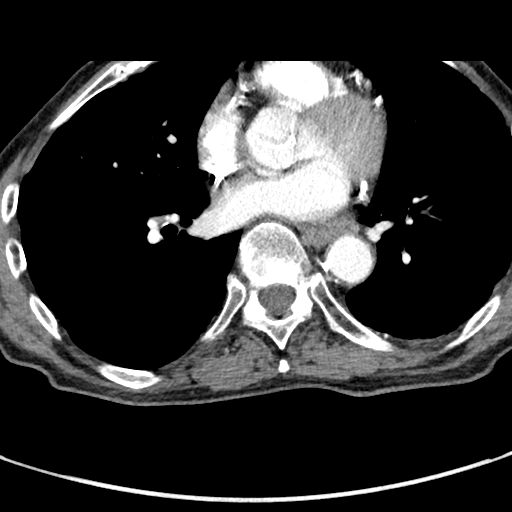
[im 59/152  lung]
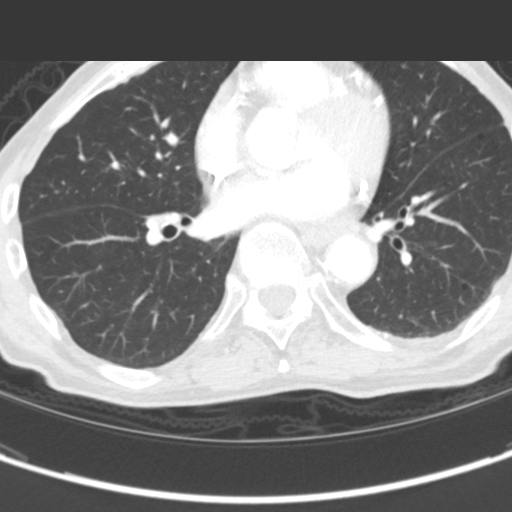
[im 70/152  lung]
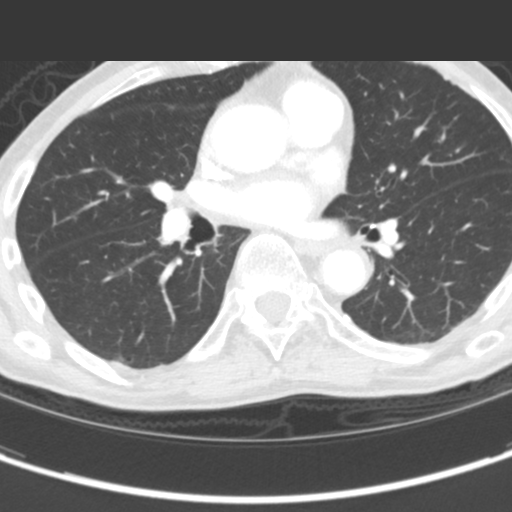
[im 82/152  lung]
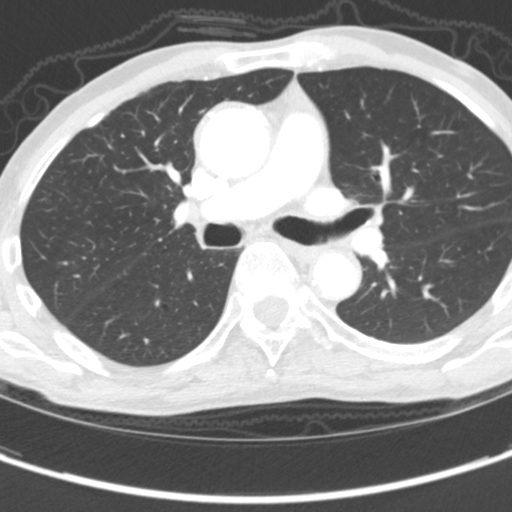
[im 93/152  lung]
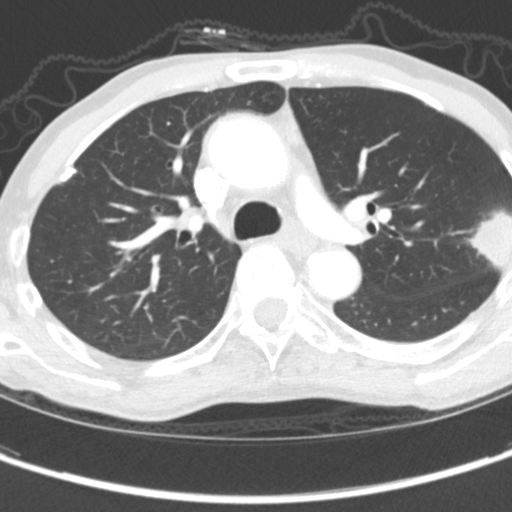
[im 105/152  mediastinal]
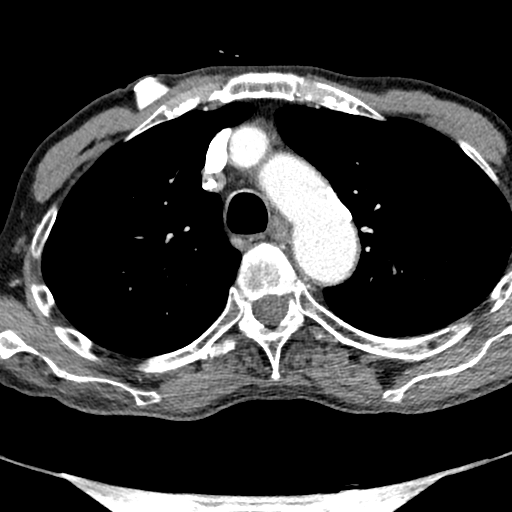
[im 105/152  lung]
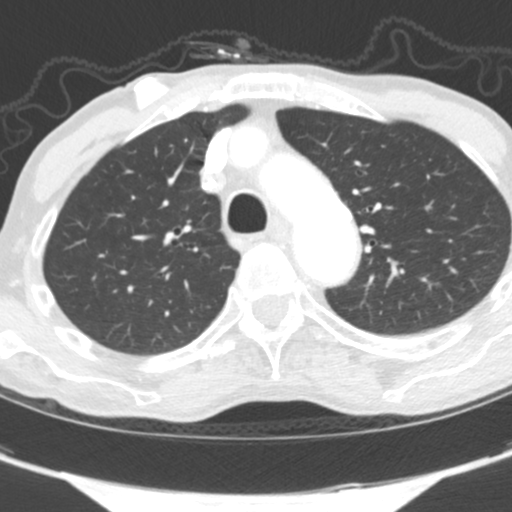
[im 117/152  lung]
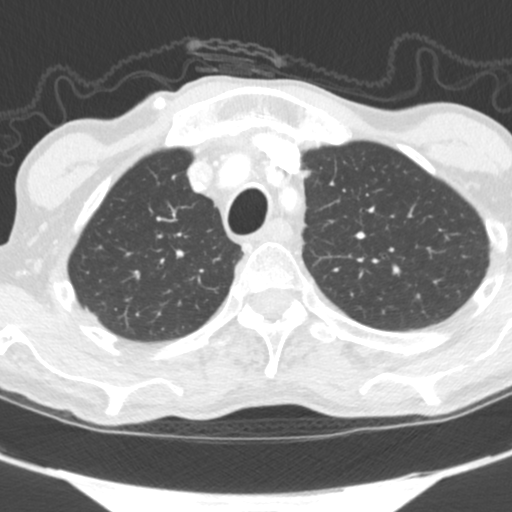
[im 128/152  lung]
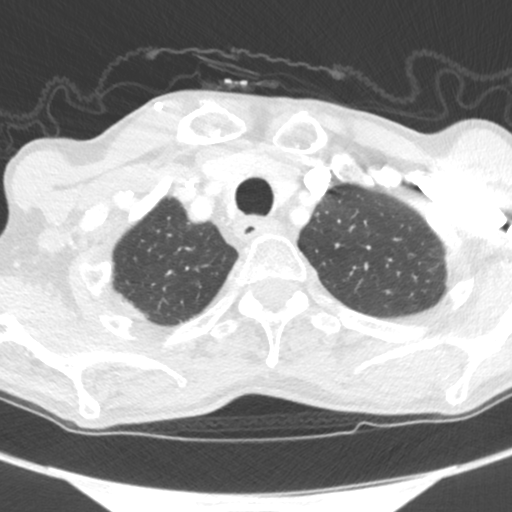
[im 140/152  lung]
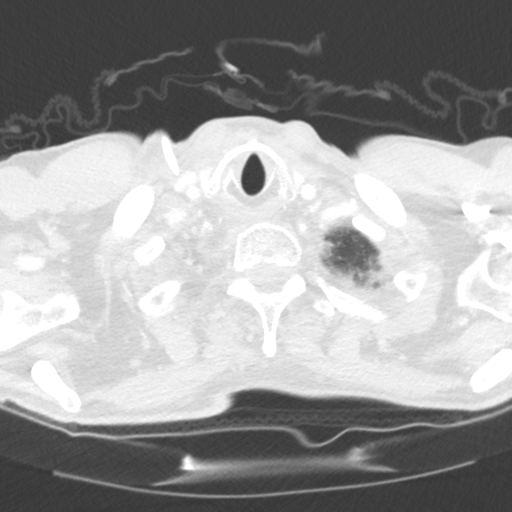

[Series 5: coronal · coronal · 0.61mm/px · 3 of 98 slices shown]
[im 20/98  lung]
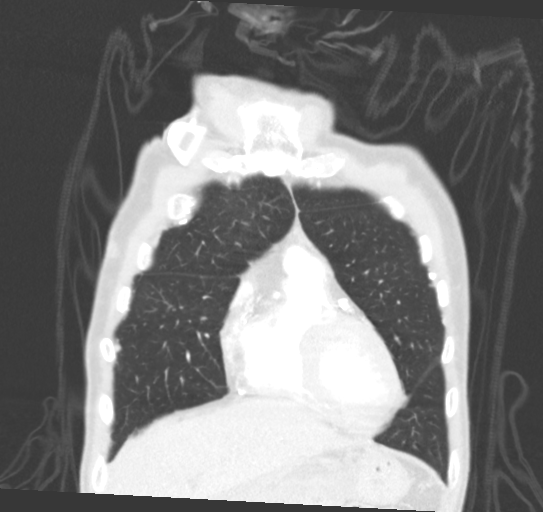
[im 39/98  lung]
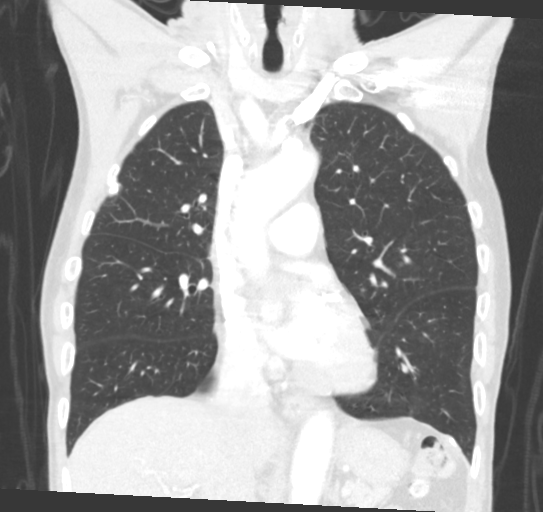
[im 59/98  lung]
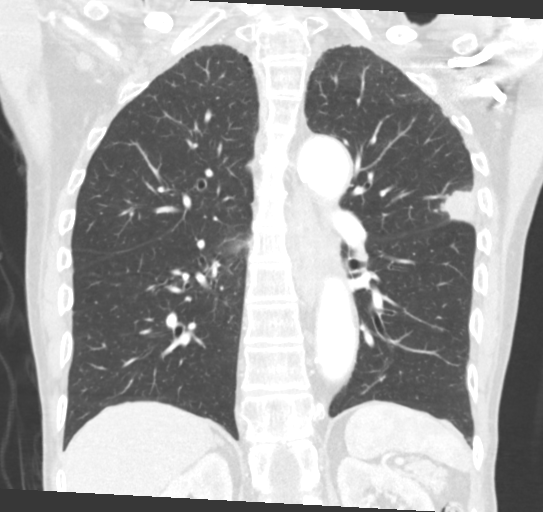

[15 of 36 positions shown; findings below may reference images not displayed]

FINDINGS: Cardiovascular: Right Port-A-Cath tip at high right atrium. Aortic
atherosclerosis. Tortuous thoracic aorta. Normal heart size.
Multivessel coronary artery atherosclerosis. No central pulmonary
embolism, on this non-dedicated study.

Mediastinum/Nodes: No mediastinal or hilar adenopathy.

Lungs/Pleura: No pleural fluid. Mild bilateral calcified pleural
plaques, including on the right on 99/7 and on the left on 100/7.
Mild centrilobular emphysema.

Posterior left upper lobe pleural-based pulmonary nodule has
significantly enlarged and become more distinct. Example 2.3 x
cm on 59/8. Compare 1.0 cm on [DATE] PET and 1.2 cm on
[DATE] biopsy.

Just caudal to this a pleural-based left upper lobe 4 mm nodule on
66/8 is new.

Upper Abdomen: Normal imaged portions of the liver, spleen,
pancreas, adrenal glands, kidneys. Gastric antral underdistention.

Musculoskeletal: No acute osseous abnormality.
IMPRESSION: 1. Significant enlargement of a posterior left upper lobe
pleural-based pulmonary nodule, consistent with known squamous cell
carcinoma. An immediately caudal smaller nodule is suspicious for
satellite/metastasis.
2. No thoracic adenopathy.
3. Asbestos related pleural disease.
4. Aortic atherosclerosis ([6P]-[6P]), coronary artery
atherosclerosis and emphysema ([6P]-[6P]).

## 2019-12-31 MED ORDER — IOHEXOL 300 MG/ML  SOLN
75.0000 mL | Freq: Once | INTRAMUSCULAR | Status: AC | PRN
  Administered 2019-12-31: 75 mL via INTRAVENOUS

## 2020-01-07 ENCOUNTER — Other Ambulatory Visit: Payer: Self-pay

## 2020-01-07 ENCOUNTER — Ambulatory Visit
Admission: RE | Admit: 2020-01-07 | Discharge: 2020-01-07 | Disposition: A | Discharge status: Home / Self Care | Payer: Medicare Other | Source: Ambulatory Visit | Attending: Radiation Oncology | Admitting: Radiation Oncology

## 2020-01-07 ENCOUNTER — Encounter: Payer: Self-pay | Admitting: Radiation Oncology

## 2020-01-07 VITALS — BP 141/74 | HR 64 | Resp 16 | Wt 116.6 lb

## 2020-01-07 DIAGNOSIS — C109 Malignant neoplasm of oropharynx, unspecified: Secondary | ICD-10-CM

## 2020-01-07 DIAGNOSIS — C7802 Secondary malignant neoplasm of left lung: Secondary | ICD-10-CM | POA: Diagnosis present

## 2020-01-07 DIAGNOSIS — R911 Solitary pulmonary nodule: Secondary | ICD-10-CM

## 2020-01-07 DIAGNOSIS — C01 Malignant neoplasm of base of tongue: Secondary | ICD-10-CM | POA: Diagnosis not present

## 2020-01-07 NOTE — Progress Notes (Signed)
Radiation Oncology Follow up Note  Name: Gregory Burton   Date:   01/07/2020 MRN:  [id number] DOB: 10/11/1940    This 79 y.o. male presents to the clinic today for 43-month follow-up status post concurrent chemoradiation therapy for stage IVb (T4N2B M0) squamous cell carcinoma the base of tongue extending to the right piriform sinus.  REFERRING PROVIDER: Casilda Carls, MD  HPI: Patient is a 79 year old male now out 4 months having completed concurrent chemotherapy and radiation therapy for stage IVb squamous cell carcinoma the base of tongue.  He is seen today in routine follow-up doing fairly well specifically denies dysphagia head and neck pain.  He does state he is hoarse and has consistent clearing his throat most likely rec related to some edema from his prior treatments.  He had a CT scan recently.  Of the head and neck region showing no significant change in right lateral retropharyngeal adenopathy primary tumor and right jugular adenopathy.  Resolved.  There is on his recent chest CT a lesion we have been following the posterior left upper lobe pleural-based consistent with known squamous cell carcinoma.  This is growing.  This was hypermetabolic on PET CT scan back in April although no evidence of mediastinal or hilar adenopathy was noted.  Patient has been having endoscopy approximately 2 weeks prior by ENT showing no evidence of disease.  COMPLICATIONS OF TREATMENT: none  FOLLOW UP COMPLIANCE: keeps appointments   PHYSICAL EXAM:  BP (!) 141/74 (BP Location: Left Arm, Patient Position: Sitting)   Pulse 64   Resp 16   Wt 116 lb 9.6 oz (52.9 kg)   BMI 18.26 kg/m  No evidence of cervical or supraclavicular adenopathy is identified.  Well-developed well-nourished patient in NAD. HEENT reveals PERLA, EOMI, discs not visualized.  Oral cavity is clear. No oral mucosal lesions are identified. Neck is clear without evidence of cervical or supraclavicular adenopathy. Lungs are clear to A&P.  Cardiac examination is essentially unremarkable with regular rate and rhythm without murmur rub or thrill. Abdomen is benign with no organomegaly or masses noted. Motor sensory and DTR levels are equal and symmetric in the upper and lower extremities. Cranial nerves II through XII are grossly intact. Proprioception is intact. No peripheral adenopathy or edema is identified. No motor or sensory levels are noted. Crude visual fields are within normal range.  RADIOLOGY RESULTS: CT scans of both head and neck and chest reviewed compatible with above-stated findings  PLAN: At this time would like to offer SBRT to his left lung lesion.  Would plan on delivering 60 Gray in 5 fractions.  Risks and benefits of treatment including possible bout of cough fatigue possible skin reaction all were discussed in detail with the patient.  I have personally set up and ordered CT simulation with 4-dimensional treatment planning and motion restriction for next week.  Patient does comprehend my treatment plan well.  I would like to take this opportunity to thank you for allowing me to participate in the care of your patient.Noreene Filbert, MD

## 2020-01-08 ENCOUNTER — Other Ambulatory Visit: Payer: Self-pay | Admitting: Oncology

## 2020-01-12 ENCOUNTER — Ambulatory Visit
Admission: RE | Admit: 2020-01-12 | Discharge: 2020-01-12 | Disposition: A | Discharge status: Home / Self Care | Payer: Medicare Other | Source: Ambulatory Visit | Attending: Radiation Oncology | Admitting: Radiation Oncology

## 2020-01-12 DIAGNOSIS — C139 Malignant neoplasm of hypopharynx, unspecified: Secondary | ICD-10-CM | POA: Diagnosis present

## 2020-01-12 DIAGNOSIS — C01 Malignant neoplasm of base of tongue: Secondary | ICD-10-CM | POA: Diagnosis present

## 2020-01-18 DIAGNOSIS — C01 Malignant neoplasm of base of tongue: Secondary | ICD-10-CM | POA: Diagnosis not present

## 2020-01-26 ENCOUNTER — Other Ambulatory Visit: Payer: Self-pay

## 2020-01-26 ENCOUNTER — Inpatient Hospital Stay: Payer: Medicare Other | Attending: Oncology

## 2020-01-26 ENCOUNTER — Ambulatory Visit
Admission: RE | Admit: 2020-01-26 | Discharge: 2020-01-26 | Disposition: A | Discharge status: Home / Self Care | Payer: Medicare Other | Source: Ambulatory Visit | Attending: Radiation Oncology | Admitting: Radiation Oncology

## 2020-01-26 DIAGNOSIS — C01 Malignant neoplasm of base of tongue: Secondary | ICD-10-CM | POA: Diagnosis not present

## 2020-01-26 DIAGNOSIS — C139 Malignant neoplasm of hypopharynx, unspecified: Secondary | ICD-10-CM | POA: Diagnosis present

## 2020-01-28 ENCOUNTER — Other Ambulatory Visit: Payer: Self-pay

## 2020-01-28 ENCOUNTER — Ambulatory Visit
Admission: RE | Admit: 2020-01-28 | Discharge: 2020-01-28 | Disposition: A | Discharge status: Home / Self Care | Payer: Medicare Other | Source: Ambulatory Visit | Attending: Radiation Oncology | Admitting: Radiation Oncology

## 2020-01-28 ENCOUNTER — Inpatient Hospital Stay: Payer: Medicare Other

## 2020-01-28 DIAGNOSIS — C01 Malignant neoplasm of base of tongue: Secondary | ICD-10-CM | POA: Diagnosis not present

## 2020-01-31 ENCOUNTER — Other Ambulatory Visit: Payer: Self-pay

## 2020-01-31 ENCOUNTER — Ambulatory Visit
Admission: RE | Admit: 2020-01-31 | Discharge: 2020-01-31 | Disposition: A | Discharge status: Home / Self Care | Payer: Medicare Other | Source: Ambulatory Visit | Attending: Radiation Oncology | Admitting: Radiation Oncology

## 2020-01-31 ENCOUNTER — Inpatient Hospital Stay: Payer: Medicare Other

## 2020-01-31 DIAGNOSIS — C01 Malignant neoplasm of base of tongue: Secondary | ICD-10-CM | POA: Diagnosis not present

## 2020-02-02 ENCOUNTER — Other Ambulatory Visit: Payer: Self-pay

## 2020-02-02 ENCOUNTER — Inpatient Hospital Stay: Payer: Medicare Other

## 2020-02-02 ENCOUNTER — Ambulatory Visit
Admission: RE | Admit: 2020-02-02 | Discharge: 2020-02-02 | Disposition: A | Discharge status: Home / Self Care | Payer: Medicare Other | Source: Ambulatory Visit | Attending: Radiation Oncology | Admitting: Radiation Oncology

## 2020-02-02 DIAGNOSIS — C01 Malignant neoplasm of base of tongue: Secondary | ICD-10-CM | POA: Diagnosis not present

## 2020-02-04 ENCOUNTER — Ambulatory Visit: Payer: Medicare Other

## 2020-02-05 ENCOUNTER — Other Ambulatory Visit: Payer: Self-pay | Admitting: Oncology

## 2020-02-07 ENCOUNTER — Ambulatory Visit
Admission: RE | Admit: 2020-02-07 | Discharge: 2020-02-07 | Disposition: A | Discharge status: Home / Self Care | Payer: Medicare Other | Source: Ambulatory Visit | Attending: Radiation Oncology | Admitting: Radiation Oncology

## 2020-02-07 ENCOUNTER — Inpatient Hospital Stay: Payer: Medicare Other

## 2020-02-07 ENCOUNTER — Other Ambulatory Visit: Payer: Self-pay

## 2020-02-07 DIAGNOSIS — C01 Malignant neoplasm of base of tongue: Secondary | ICD-10-CM | POA: Diagnosis not present

## 2020-02-09 ENCOUNTER — Other Ambulatory Visit: Payer: Self-pay | Admitting: Oncology

## 2020-02-09 ENCOUNTER — Ambulatory Visit: Payer: Medicare Other

## 2020-02-14 ENCOUNTER — Ambulatory Visit: Payer: Medicare Other

## 2020-02-14 ENCOUNTER — Ambulatory Visit: Payer: Medicare Other | Admitting: Oncology

## 2020-02-14 ENCOUNTER — Other Ambulatory Visit: Payer: Medicare Other

## 2020-02-29 ENCOUNTER — Other Ambulatory Visit: Payer: Self-pay | Admitting: Oncology

## 2020-03-01 ENCOUNTER — Encounter: Payer: Self-pay | Admitting: Oncology

## 2020-03-01 ENCOUNTER — Inpatient Hospital Stay: Payer: Medicare Other

## 2020-03-01 ENCOUNTER — Inpatient Hospital Stay (HOSPITAL_BASED_OUTPATIENT_CLINIC_OR_DEPARTMENT_OTHER): Payer: Medicare Other | Admitting: Oncology

## 2020-03-01 ENCOUNTER — Inpatient Hospital Stay: Payer: Medicare Other | Attending: Oncology

## 2020-03-01 ENCOUNTER — Other Ambulatory Visit: Payer: Self-pay

## 2020-03-01 VITALS — BP 135/67 | HR 63 | Temp 97.7°F | Resp 18

## 2020-03-01 DIAGNOSIS — C3492 Malignant neoplasm of unspecified part of left bronchus or lung: Secondary | ICD-10-CM | POA: Diagnosis not present

## 2020-03-01 DIAGNOSIS — C109 Malignant neoplasm of oropharynx, unspecified: Secondary | ICD-10-CM

## 2020-03-01 DIAGNOSIS — Z87891 Personal history of nicotine dependence: Secondary | ICD-10-CM | POA: Insufficient documentation

## 2020-03-01 DIAGNOSIS — F1020 Alcohol dependence, uncomplicated: Secondary | ICD-10-CM | POA: Insufficient documentation

## 2020-03-01 DIAGNOSIS — F1010 Alcohol abuse, uncomplicated: Secondary | ICD-10-CM

## 2020-03-01 DIAGNOSIS — I6521 Occlusion and stenosis of right carotid artery: Secondary | ICD-10-CM | POA: Diagnosis not present

## 2020-03-01 DIAGNOSIS — Z7189 Other specified counseling: Secondary | ICD-10-CM

## 2020-03-01 DIAGNOSIS — C01 Malignant neoplasm of base of tongue: Secondary | ICD-10-CM | POA: Insufficient documentation

## 2020-03-01 DIAGNOSIS — Z95828 Presence of other vascular implants and grafts: Secondary | ICD-10-CM | POA: Diagnosis not present

## 2020-03-01 LAB — COMPREHENSIVE METABOLIC PANEL
ALT: 20 U/L (ref 0–44)
AST: 19 U/L (ref 15–41)
Albumin: 4 g/dL (ref 3.5–5.0)
Alkaline Phosphatase: 56 U/L (ref 38–126)
Anion gap: 9 (ref 5–15)
BUN: 13 mg/dL (ref 8–23)
CO2: 24 mmol/L (ref 22–32)
Calcium: 9.3 mg/dL (ref 8.9–10.3)
Chloride: 103 mmol/L (ref 98–111)
Creatinine, Ser: 1.03 mg/dL (ref 0.61–1.24)
GFR, Estimated: >60 mL/min (ref >60)
Glucose, Bld: 102 mg/dL — ABNORMAL HIGH (ref 70–99)
Potassium: 3.8 mmol/L (ref 3.5–5.1)
Sodium: 136 mmol/L (ref 135–145)
Total Bilirubin: 1.3 mg/dL — ABNORMAL HIGH (ref 0.3–1.2)
Total Protein: 7.1 g/dL (ref 6.5–8.1)

## 2020-03-01 LAB — CBC WITH DIFFERENTIAL/PLATELET
Abs Immature Granulocytes: 0.02 10*3/uL (ref 0.00–0.07)
Basophils Absolute: 0 10*3/uL (ref 0.0–0.1)
Basophils Relative: 0 %
Eosinophils Absolute: 0.1 10*3/uL (ref 0.0–0.5)
Eosinophils Relative: 2 %
HCT: 33 % — ABNORMAL LOW (ref 39.0–52.0)
Hemoglobin: 11.2 g/dL — ABNORMAL LOW (ref 13.0–17.0)
Immature Granulocytes: 0 %
Lymphocytes Relative: 13 %
Lymphs Abs: 0.7 10*3/uL (ref 0.7–4.0)
MCH: 30.3 pg (ref 26.0–34.0)
MCHC: 33.9 g/dL (ref 30.0–36.0)
MCV: 89.2 fL (ref 80.0–100.0)
Monocytes Absolute: 0.5 10*3/uL (ref 0.1–1.0)
Monocytes Relative: 9 %
Neutro Abs: 4.4 10*3/uL (ref 1.7–7.7)
Neutrophils Relative %: 76 %
Platelets: 185 10*3/uL (ref 150–400)
RBC: 3.7 MIL/uL — ABNORMAL LOW (ref 4.22–5.81)
RDW: 13.2 % (ref 11.5–15.5)
WBC: 5.8 10*3/uL (ref 4.0–10.5)
nRBC: 0 % (ref 0.0–0.2)

## 2020-03-01 LAB — MAGNESIUM: Magnesium: 1.6 mg/dL — ABNORMAL LOW (ref 1.7–2.4)

## 2020-03-01 MED ORDER — HEPARIN SOD (PORK) LOCK FLUSH 100 UNIT/ML IV SOLN
500.0000 [IU] | Freq: Once | INTRAVENOUS | Status: AC
  Administered 2020-03-01: 500 [IU] via INTRAVENOUS
  Filled 2020-03-01: qty 5

## 2020-03-01 MED ORDER — SODIUM CHLORIDE 0.9% FLUSH
10.0000 mL | INTRAVENOUS | Status: AC | PRN
  Administered 2020-03-01: 10 mL via INTRAVENOUS
  Filled 2020-03-01: qty 10

## 2020-03-01 MED ORDER — MAGNESIUM CHLORIDE 64 MG PO TBEC
DELAYED_RELEASE_TABLET | ORAL | 0 refills | Status: DC
Start: 2020-03-01 — End: 2020-07-14

## 2020-03-01 NOTE — Progress Notes (Signed)
Pt here for follow up. Pt reports having soreness in throat. He has magic mouthwash at home but has not used. Advised pt to use mouthwash.

## 2020-03-01 NOTE — Progress Notes (Signed)
Hematology/Oncology progress note [facility] Telephone:([phone] [phone]   Patient Care Team: Casilda Carls, MD as PCP - General (Internal Medicine) Earlie Server, [name], [name]  HISTORY OF PRESENTING ILLNESS:  Gregory Burton is a  80 y.o.  male with PMH listed below was seen in consultation at the request of  Casilda Carls, MD  for evaluation of lymph node.  Patient moved from [place] to [place] for about 3 weeks. He has noticed a neck knot on the right side of his neck. The knot is sore and makes him to cough and he feels it when he swallows. No swallowing difficulty.  Patient was accompanied by his wife. She reports that patient's previous PCP has done neck sonogram for evaluation.  Patient also had right lower molar tooth extraction done a few weeks before he noticed  He also took a course of antibiotics.  Due to his tooth pain, his appetite has decreased and he has lost weight loss, about 20 pounds, he can not specify the time frame.   Alcoholism History of prostate cancer. S/p Radiation/seed, he follows up with [facility].   INTERVAL HISTORY Gregory Burton is a 80 y.o. male who has above history reviewed by me today presents for follow-up of treatment. He has finished chest radiation Clinically he is doing well. Appetite is fair. Denies any dysphagia, shortness of breath, chest pain, abdominal pain. Patient takes magnesium supplementation.  Review of Systems  Constitutional: Negative for appetite change, chills, fatigue, fever and unexpected weight change.  HENT:   Negative for hearing loss and voice change.   Eyes: Negative for eye problems and icterus.  Respiratory: Negative for chest tightness, cough and shortness of breath.   Cardiovascular: Negative for chest pain and leg swelling.   Gastrointestinal: Negative for abdominal distention, abdominal pain, blood in stool and nausea.  Endocrine: Negative for hot flashes.  Genitourinary: Negative for difficulty urinating, dysuria and frequency.   Musculoskeletal: Negative for arthralgias.  Skin: Negative for itching and rash.  Neurological: Negative for extremity weakness, light-headedness and numbness.  Hematological: Negative for adenopathy. Does not bruise/bleed easily.  Psychiatric/Behavioral: Negative for confusion.       Forgetful    MEDICAL HISTORY:  Past Medical History:  Diagnosis Date  . Alcohol abuse   . Blood transfusion without reported diagnosis 2013  . Cataract   . Dementia ([place])   . Glaucoma   . Gout   . Hypercholesteremia   . Hypertension   . Prostate CA ([place]) 2008  . 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 BRAIN SURGERY  2012  . HERNIA REPAIR    . PORTA CATH INSERTION N/A 07/27/2019   Procedure: PORTA CATH INSERTION;  Surgeon: Katha Cabal, MD;  Location: [facility];  Service: Cardiovascular;  Laterality: N/A;    SOCIAL HISTORY: Social History   Socioeconomic History  . Marital status: Married    Spouse name: Not on file  . Number of children: Not on file  . Years of education: Not on file  . Highest education level: Not on file  Occupational History  . Not on file  Tobacco Use  . Smoking status: Former Smoker    Years: 21.00    Types: Cigarettes    Quit date: 05/11/2008  Years since quitting: 11.8  . Smokeless tobacco: Never Used  Vaping Use  . Vaping Use: Never used  Substance and Sexual Activity  . Alcohol use: Yes    Comment: 0.5 pint liquor a week  . Drug use: Never  . Sexual activity: Not Currently  Other Topics Concern  . Not on file  Social History Narrative   Lives at home with wife. Wife states he has some dementia but is able to sign his own consent.   Social  Determinants of Health   Financial Resource Strain: Not on file  Food Insecurity: Not on file  Transportation Needs: Not on file  Physical Activity: Not on file  Stress: Not on file  Social Connections: Not on file  Intimate Partner Violence: Not on file    FAMILY HISTORY: No family history on file.  ALLERGIES:  is allergic to enalapril.  MEDICATIONS:  Current Outpatient Medications  Medication Sig Dispense Refill  . atorvastatin (LIPITOR) 20 MG tablet Take 20 mg by mouth daily.    Marland Kitchen donepezil (ARICEPT) 10 MG tablet Take 10 mg by mouth at bedtime.    . dorzolamide-timolol (COSOPT) 22.3-6.8 MG/ML ophthalmic solution Place 1 drop into both eyes 2 (two) times daily.     . folic acid (FOLVITE) 1 MG tablet Take 1 mg by mouth daily.    Marland Kitchen latanoprost (XALATAN) 0.005 % ophthalmic solution Place 1 drop into both eyes at bedtime.     . lidocaine-prilocaine (EMLA) cream Apply 1 application topically as needed. Apply small amount to port site approx 1-2 hours prior to appointment. 30 g 1  . MAG64 64 MG TBEC TAKE 1 TABLET (64 MG TOTAL) BY MOUTH 2 (TWO) TIMES DAILY. 180 tablet 0  . magic mouthwash w/lidocaine SOLN Take 5 mLs by mouth 4 (four) times daily as needed for mouth pain. Sig: Swish/Swallow 5-10 ml four times a day as needed. Dispense 480 ml. 1RF (Patient not taking: Reported on 09/08/2019) 480 mL 1  . magnesium chloride (SLOW-MAG) 64 MG TBEC SR tablet Take 1 tablet (64 mg total) by mouth 2 (two) times daily. 60 tablet 2  . megestrol (MEGACE) 40 MG tablet TAKE 1 TABLET BY MOUTH TWICE A DAY 60 tablet 0  . prednisoLONE 5 MG TABS tablet Take 5 mg by mouth daily as needed (Gout). (Patient not taking: Reported on 09/08/2019)    . predniSONE (DELTASONE) 5 MG tablet Take 5 mg by mouth daily with breakfast. As needed for gout flare (Patient not taking: Reported on 09/08/2019)    . prochlorperazine (COMPAZINE) 10 MG tablet Take 1 tablet (10 mg total) by mouth every 6 (six) hours as needed (Nausea or  vomiting). (Patient not taking: Reported on 10/07/2019) 30 tablet 1  . Thiamine HCl (VITAMIN B-1 PO) Take 100 mg by mouth daily.      No current facility-administered medications for this visit.   Facility-Administered Medications Ordered in Other Visits  Medication Dose Route Frequency Provider Last Rate Last Admin  . heparin lock flush 100 unit/mL  500 Units Intravenous Once Earlie Server, MD      . sodium chloride flush (NS) 0.9 % injection 10 mL  10 mL Intravenous PRN Earlie Server, MD   10 mL at 03/01/20 [id number]     PHYSICAL EXAMINATION: ECOG PERFORMANCE STATUS: 1 - Symptomatic but completely ambulatory Vitals:   03/01/20 0902  BP: 135/67  Pulse: 63  Resp: 18  Temp: 97.7 F (36.5 C)   There were no vitals filed for this  visit.  Physical Exam Constitutional:      General: He is not in acute distress. HENT:     Head: Normocephalic and atraumatic.     Mouth/Throat:     Comments: Ritta Slot has resolved Eyes:     General: No scleral icterus. Neck:     Comments: Previously right cervical lymphadenopathy no longer palpable Cardiovascular:     Rate and Rhythm: Normal rate and regular rhythm.     Heart sounds: Normal heart sounds.  Pulmonary:     Effort: Pulmonary effort is normal. No respiratory distress.     Breath sounds: No wheezing.  Abdominal:     General: Bowel sounds are normal. There is no distension.     Palpations: Abdomen is soft.  Musculoskeletal:        General: No deformity. Normal range of motion.     Cervical back: Normal range of motion and neck supple.  Skin:    General: Skin is warm and dry.     Findings: No erythema or rash.  Neurological:     Mental Status: He is alert. Mental status is at baseline.     Cranial Nerves: No cranial nerve deficit.     Coordination: Coordination normal.     LABORATORY DATA:  I have reviewed the data as listed Lab Results  Component Value Date   WBC 4.1 11/15/2019   HGB 10.6 (L) 11/15/2019   HCT 32.1 (L) 11/15/2019   MCV  95.0 11/15/2019   PLT 169 11/15/2019   Recent Labs    09/01/19 0751 09/08/19 0832 11/15/19 0818 12/31/19 0927  NA 135 140 141  --   K 3.9 4.2 4.1  --   CL 105 107 109  --   CO2 21* 25 24  --   GLUCOSE 100* 125* 98  --   BUN 19 23 19   --   CREATININE 0.86 0.98 0.88 1.00  CALCIUM 8.7* 9.0 9.2  --   GFRNONAA >60 >60 >60  --   GFRAA >60 >60 >60  --   PROT 6.9 6.8 7.0  --   ALBUMIN 3.2* 3.2* 3.9  --   AST 16 16 17   --   ALT 16 13 19   --   ALKPHOS 55 56 41  --   BILITOT 0.7 0.5 1.0  --    Iron/TIBC/Ferritin/ %Sat No results found for: IRON, TIBC, FERRITIN, IRONPCTSAT    RADIOGRAPHIC STUDIES: I have personally reviewed the radiological images as listed and agreed with the findings in the report. No results found.CT Soft Tissue Neck W Contrast  Result Date: 12/31/2019 CLINICAL DATA:  Head neck cancer surveillance. 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 EXAM: CT NECK WITH CONTRAST TECHNIQUE: Multidetector CT imaging of the neck was performed using the standard protocol following the bolus administration of intravenous contrast. CONTRAST:  60mL OMNIPAQUE IOHEXOL 300 MG/ML  SOLN COMPARISON:  05/27/2019 FINDINGS: Pharynx and larynx: Submucosal low-density appearance consistent with interval radiotherapy. No asymmetric masslike enhancement seen today at the right piriform sinus. No metachronous tumor is seen. Salivary glands: Post treatment volume loss and high-density. Thyroid: Unremarkable Lymph nodes: No detected change in a low-density center right lateral retropharyngeal node measuring 18 mm. Right jugular chain necrotic lymph nodes are no longer enlarged or low-density. No interval contralateral adenopathy. Vascular: Severely bulky atheromatous disease of the cervical carotids with underfilling of the right ICA, unchanged. Unremarkable right IJ porta catheter. Limited intracranial: Negative Visualized orbits: Negative Mastoids and visualized paranasal sinuses: Clear  Skeleton: No bony erosion seen.  Diffuse degenerative disease. Upper chest: Left upper lobe pulmonary nodule. Chest CT is reported separately. IMPRESSION: 1. No significant change in the right lateral retropharyngeal adenopathy. 2. The primary tumor and right jugular adenopathy appears resolved. 3. Severe carotid atherosclerosis with underfilling of the right ICA beyond bulky bifurcation disease. Electronically Signed   By: Monte Fantasia M.D.   On: 12/31/2019 12:31   CT CHEST W CONTRAST  Result Date: 12/31/2019 CLINICAL DATA:  Lung nodule. Tongue carcinoma. Known squamous cell carcinoma of left lung. EXAM: CT CHEST WITH CONTRAST TECHNIQUE: Multidetector CT imaging of the chest was performed during intravenous contrast administration. CONTRAST:  20mL OMNIPAQUE IOHEXOL 300 MG/ML  SOLN COMPARISON:  PET of 06/07/2019.  Clinic note of 10/07/2019 FINDINGS: Cardiovascular: Right Port-A-Cath tip at high right atrium. Aortic atherosclerosis. Tortuous thoracic aorta. Normal heart size. Multivessel coronary artery atherosclerosis. No central pulmonary embolism, on this non-dedicated study. Mediastinum/Nodes: No mediastinal or hilar adenopathy. Lungs/Pleura: No pleural fluid. Mild bilateral calcified pleural plaques, including on the right on 99/7 and on the left on 100/7. Mild centrilobular emphysema. Posterior left upper lobe pleural-based pulmonary nodule has significantly enlarged and become more distinct. Example 2.3 x 2.1 cm on 59/8. Compare 1.0 cm on 06/07/2019 PET and 1.2 cm on 06/29/2019 biopsy. Just caudal to this a pleural-based left upper lobe 4 mm nodule on 66/8 is new. Upper Abdomen: Normal imaged portions of the liver, spleen, pancreas, adrenal glands, kidneys. Gastric antral underdistention. Musculoskeletal: No acute osseous abnormality. IMPRESSION: 1. Significant enlargement of a posterior left upper lobe pleural-based pulmonary nodule, consistent with known squamous cell carcinoma. An immediately caudal  smaller nodule is suspicious for satellite/metastasis. 2. No thoracic adenopathy. 3. Asbestos related pleural disease. 4. Aortic atherosclerosis (ICD10-I70.0), coronary artery atherosclerosis and emphysema (ICD10-J43.9). Electronically Signed   By: Abigail Miyamoto M.D.   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)   2. Hypomagnesemia   3. Port-A-Cath in place   4. 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  Finished on 09/07/2019 12/31/2019 CT soft tissue neck with contrast showed no significant change in the right lateral retropharyngeal adenopathy.  Primary tumor and right jugular adenopathy appears resolved.  Severe carotid atherosclerosis with underfilling of the right ICA . Labs are reviewed and discussed with patient. Counts has improved. He has also gained weight Clinically doing very well today. Physical examination showed significant improvement and complete resolution of right cervical lymphadenopathy.  #Left upper lobe squamous cell carcinoma, stage I Finished SBRT on 02/07/2020.  Repeat CT chest as well as CT neck in February 2021. #Severe chronic hypomagnesemia, secondary to chronic alcoholism as well as recent cisplatin use. Magnesium level is 1.6 which is at his baseline.  Recommend patient to continue slow magnesium 1 tablet twice daily.  Refills were sent.  #Port-A-Cath in place,  recommend port flush every 8 weeks.  All questions were answered. The  patient knows to call the clinic with any problems questions or concerns.   Return of visit: After CT scan.  Earlie Server, MD, PhD Hematology Oncology [facility] Pager- [phone] 03/01/2020

## 2020-03-13 ENCOUNTER — Ambulatory Visit: Payer: Medicare Other | Admitting: Radiation Oncology

## 2020-03-31 ENCOUNTER — Other Ambulatory Visit: Payer: Self-pay

## 2020-03-31 ENCOUNTER — Ambulatory Visit
Admission: RE | Admit: 2020-03-31 | Discharge: 2020-03-31 | Disposition: A | Discharge status: Home / Self Care | Payer: Medicare Other | Source: Ambulatory Visit | Attending: Oncology | Admitting: Oncology

## 2020-03-31 ENCOUNTER — Telehealth: Payer: Self-pay | Admitting: *Deleted

## 2020-03-31 DIAGNOSIS — C109 Malignant neoplasm of oropharynx, unspecified: Secondary | ICD-10-CM | POA: Insufficient documentation

## 2020-03-31 DIAGNOSIS — C3492 Malignant neoplasm of unspecified part of left bronchus or lung: Secondary | ICD-10-CM | POA: Diagnosis present

## 2020-03-31 IMAGING — CT CT CHEST W/ CM
2 of 4 series · 14 of 36 positions shown, 17 images · IV contrast (omnipaque)
Comparison: [DATE]

CLINICAL DATA: Head and neck cancer.  Restaging.

EXAM:
CT CHEST WITH CONTRAST
TECHNIQUE: Multidetector CT imaging of the chest was performed during
intravenous contrast administration.
CONTRAST:  75mL OMNIPAQUE IOHEXOL 300 MG/ML  SOLN

[Series 8: lungs cap with 2.00 ax · axial · 0.54mm/px · z∈[-1048,-724]mm · 11 of 182 slices shown, 14 images]
[im 10/182  mediastinal]
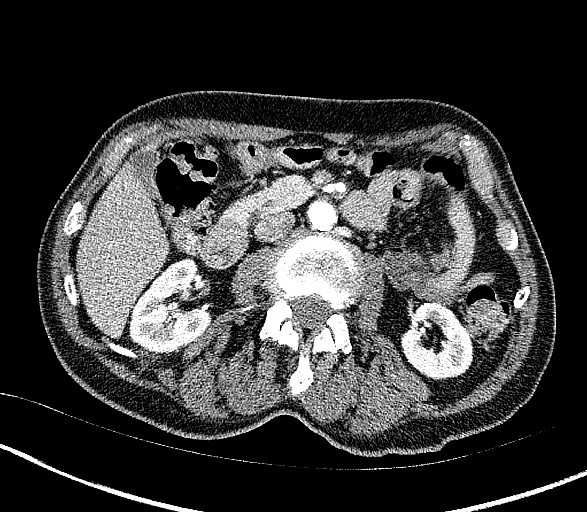
[im 10/182  lung]
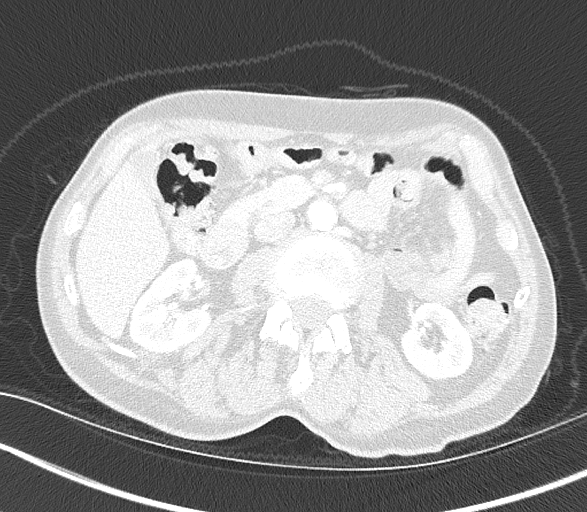
[im 29/182  lung]
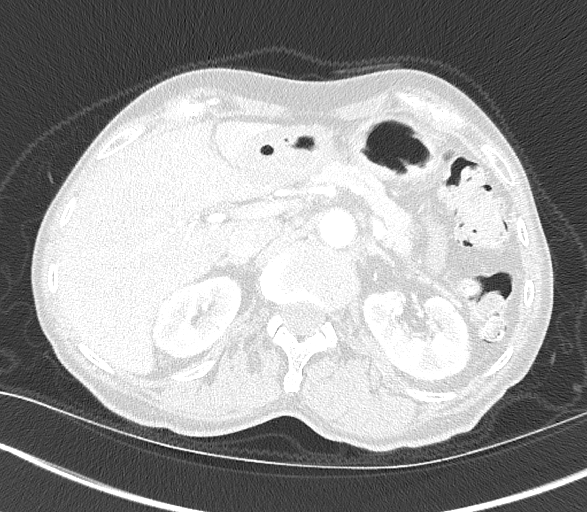
[im 48/182  lung]
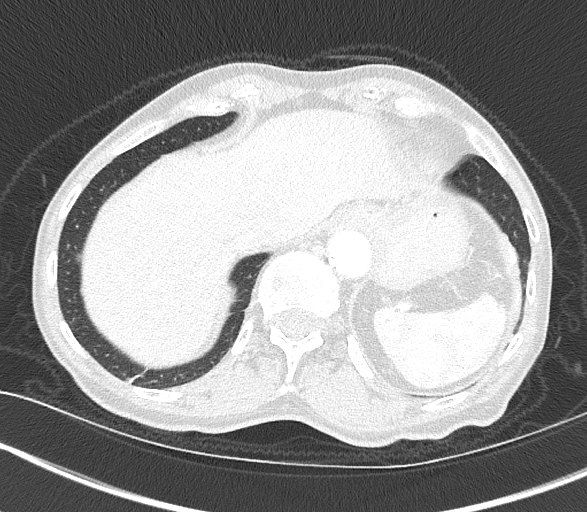
[im 58/182  lung]
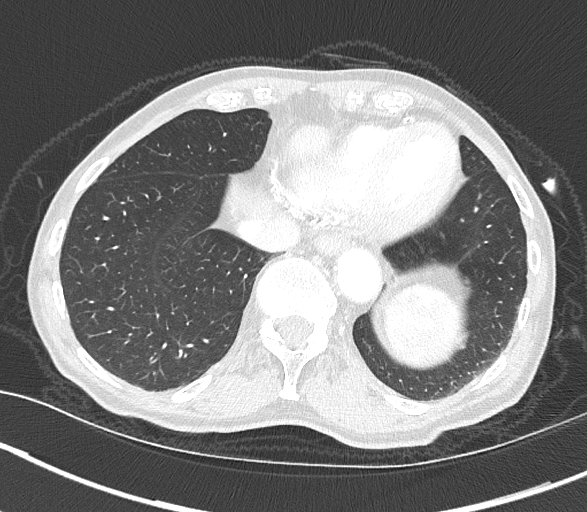
[im 77/182  mediastinal]
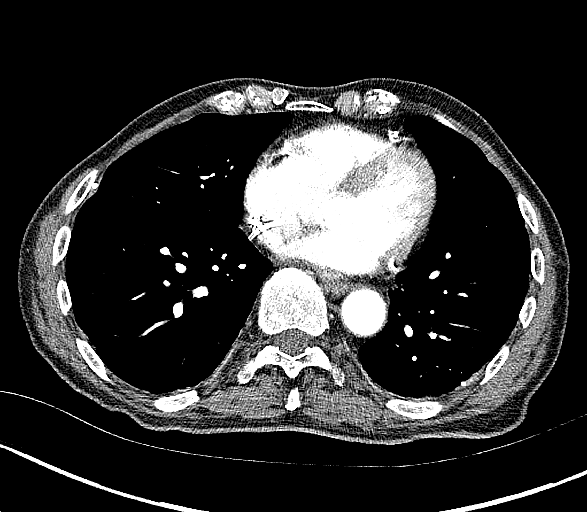
[im 77/182  lung]
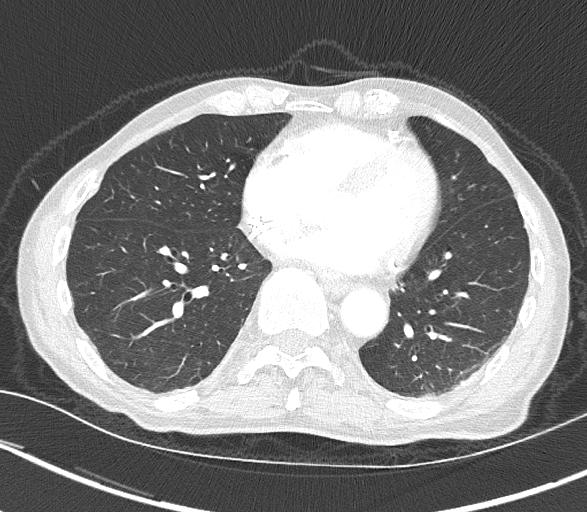
[im 96/182  lung]
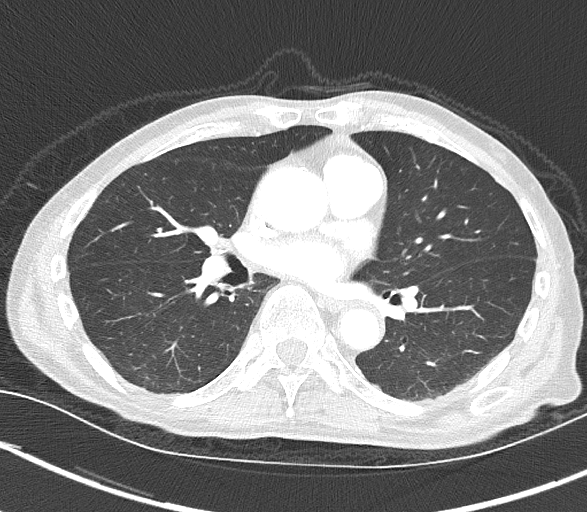
[im 105/182  lung]
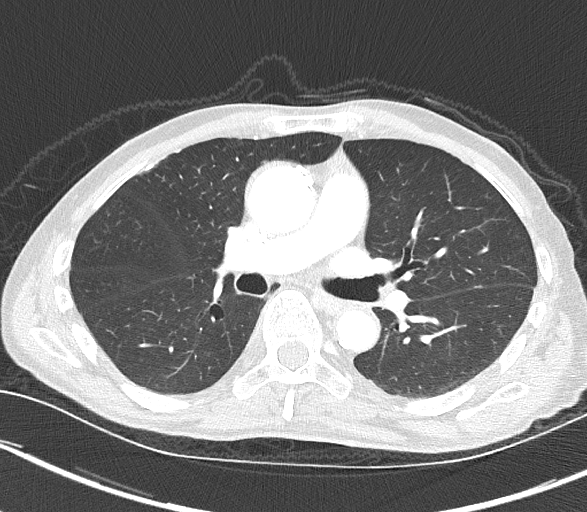
[im 124/182  lung]
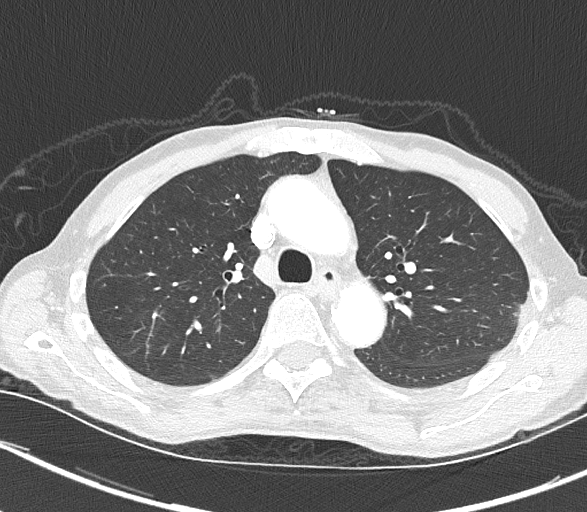
[im 134/182  mediastinal]
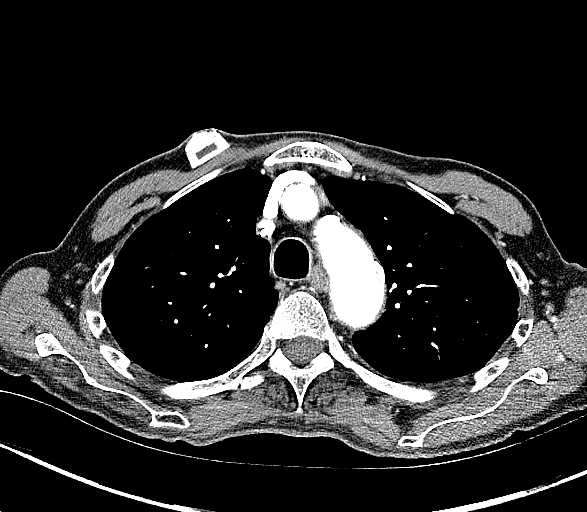
[im 134/182  lung]
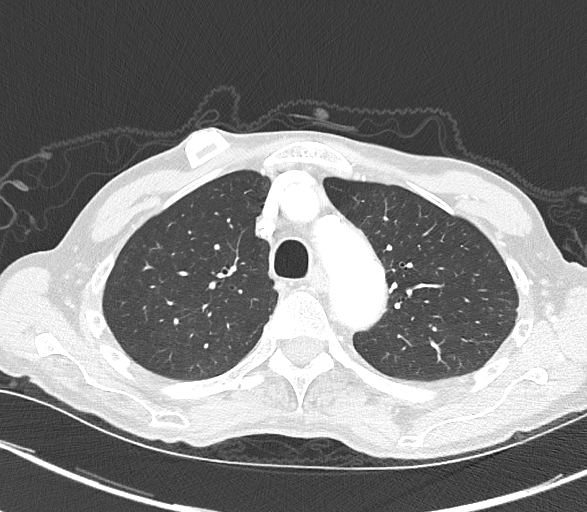
[im 153/182  lung]
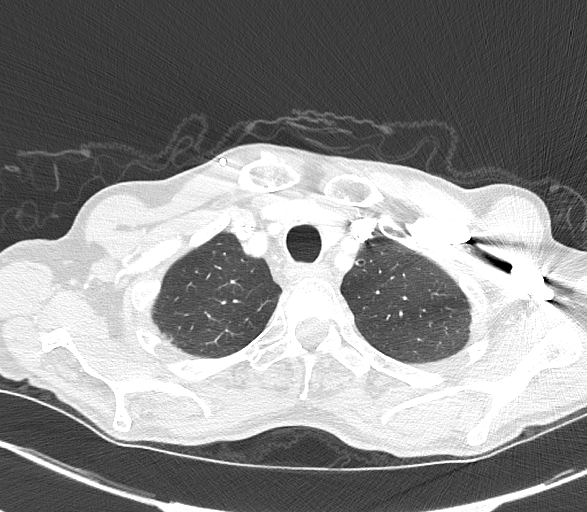
[im 172/182  lung]
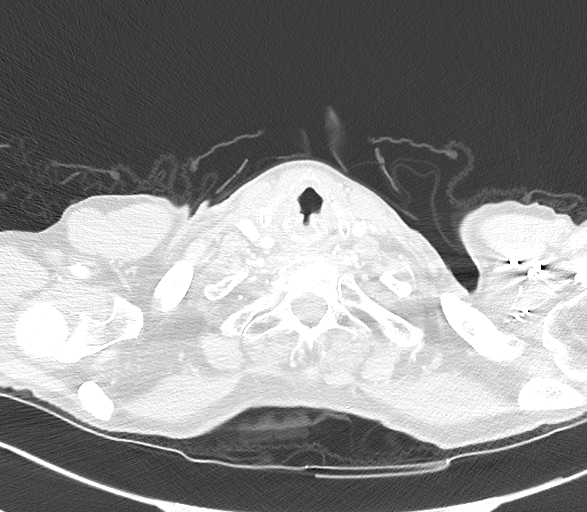

[Series 10: coronal cap with 2.00 cor · coronal · 0.62mm/px · 3 of 106 slices shown]
[im 22/106  lung]
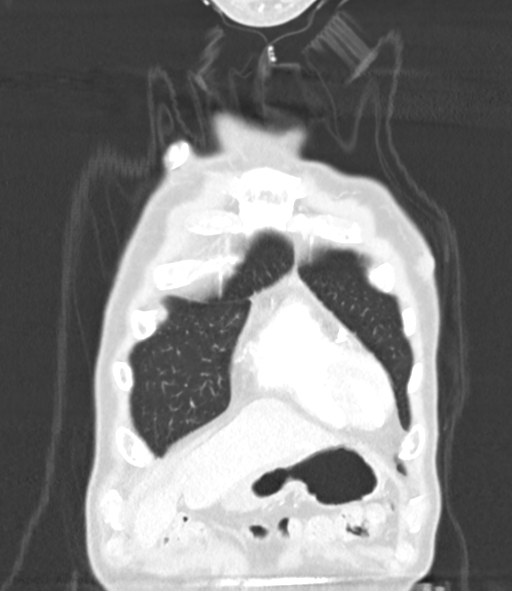
[im 43/106  lung]
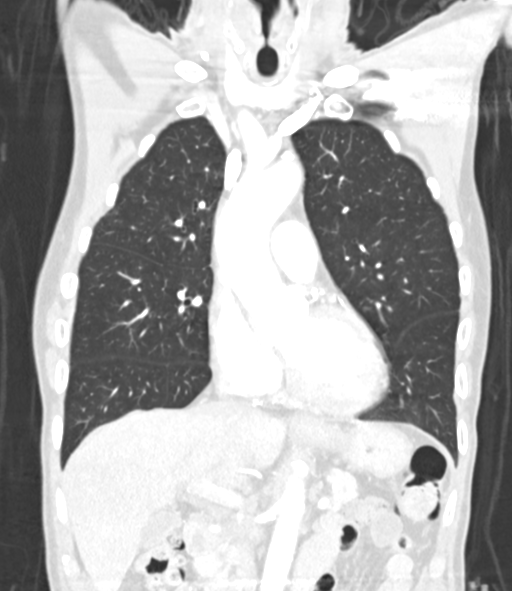
[im 64/106  lung]
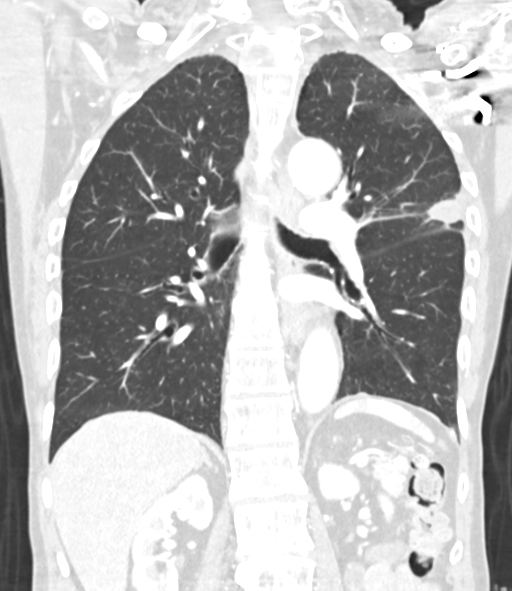

[14 of 36 positions shown; findings below may reference images not displayed]

FINDINGS: Cardiovascular: The heart size is normal. No substantial pericardial
effusion. Coronary artery calcification is evident. Atherosclerotic
calcification is noted in the wall of the thoracic aorta. Right
Port-A-Cath tip is positioned in the right atrium.

Mediastinum/Nodes: No mediastinal lymphadenopathy. There is no hilar
lymphadenopathy. The esophagus has normal imaging features. There is
no axillary lymphadenopathy.

Lungs/Pleura: Bilateral calcified pleural plaques are consistent
with previous asbestos exposure. No suspicious pulmonary nodule or
mass in the right lung. Posterior left upper lobe pleural base
nodule is minimally smaller in the interval measuring 2.1 x 1.8 cm
today compared to 2.3 x 2.1 cm previously. Tiny nodule seen just
caudal to this dominant lesion is less evident today. No new
suspicious nodule or mass in the left lung.

Upper Abdomen: Unremarkable

Musculoskeletal: 2.7 x 2.4 cm soft tissue lesion destroys the right
aspect of the T2 vertebral body and inferior aspect of the posterior
right second rib.
IMPRESSION: 1. Interval development of a 2.7 cm metastatic lesion destroying the
right aspect of the T2 vertebral body and inferior aspect of the
posterior right second rib.
2. Minimal interval decrease in size of the posterior left upper
lobe pleural based nodule with interval decrease in size of the tiny
adjacent satellite nodule.
3. Calcified pleural plaques consistent with previous asbestos
exposure.
4. Aortic Atherosclerosis ([KP]-[KP]).

These results will be called to the ordering clinician or
representative by the Radiologist Assistant, and communication
documented in the PACS or [REDACTED].

## 2020-03-31 MED ORDER — IOHEXOL 300 MG/ML  SOLN
75.0000 mL | Freq: Once | INTRAMUSCULAR | Status: AC | PRN
  Administered 2020-03-31: 75 mL via INTRAVENOUS

## 2020-03-31 NOTE — Telephone Encounter (Signed)
Gregory Burton, please refer this patient to see [name] for palliative RT

## 2020-03-31 NOTE — Telephone Encounter (Signed)
Called report  IMPRESSION: 1. Interval development of a 2.7 cm metastatic lesion destroying the right aspect of the T2 vertebral body and inferior aspect of the posterior right second rib. 2. Minimal interval decrease in size of the posterior left upper lobe pleural based nodule with interval decrease in size of the tiny adjacent satellite nodule. 3. 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 4. Aortic Atherosclerosis (ICD10-I70.0).  These results will be called to the ordering clinician or representative by the Radiologist Assistant, and communication documented in the PACS or [employer].   Electronically Signed   By: Misty Stanley M.D.   On: 03/31/2020 12:48

## 2020-04-03 NOTE — Telephone Encounter (Signed)
Pt has routine follow up scheduled with Chrystal on 2/8 already.  Ana/Tamika - could you let Dr. Baruch Gouty know about his recent CT scan showing new lesion in T2?

## 2020-04-04 ENCOUNTER — Inpatient Hospital Stay: Payer: Medicare Other

## 2020-04-04 ENCOUNTER — Ambulatory Visit
Admission: RE | Admit: 2020-04-04 | Discharge: 2020-04-04 | Disposition: A | Discharge status: Home / Self Care | Payer: Medicare Other | Source: Ambulatory Visit | Attending: Radiation Oncology | Admitting: Radiation Oncology

## 2020-04-04 ENCOUNTER — Inpatient Hospital Stay (HOSPITAL_BASED_OUTPATIENT_CLINIC_OR_DEPARTMENT_OTHER): Payer: Medicare Other | Admitting: Oncology

## 2020-04-04 ENCOUNTER — Encounter: Payer: Self-pay | Admitting: Radiation Oncology

## 2020-04-04 ENCOUNTER — Encounter: Payer: Self-pay | Admitting: Oncology

## 2020-04-04 ENCOUNTER — Inpatient Hospital Stay: Payer: Medicare Other | Attending: Oncology

## 2020-04-04 VITALS — BP 135/82 | HR 59 | Temp 97.7°F | Resp 16 | Wt 118.2 lb

## 2020-04-04 DIAGNOSIS — C109 Malignant neoplasm of oropharynx, unspecified: Secondary | ICD-10-CM | POA: Diagnosis not present

## 2020-04-04 DIAGNOSIS — R911 Solitary pulmonary nodule: Secondary | ICD-10-CM

## 2020-04-04 DIAGNOSIS — C7951 Secondary malignant neoplasm of bone: Secondary | ICD-10-CM | POA: Diagnosis not present

## 2020-04-04 DIAGNOSIS — F1020 Alcohol dependence, uncomplicated: Secondary | ICD-10-CM | POA: Diagnosis not present

## 2020-04-04 DIAGNOSIS — G893 Neoplasm related pain (acute) (chronic): Secondary | ICD-10-CM | POA: Diagnosis not present

## 2020-04-04 DIAGNOSIS — C3412 Malignant neoplasm of upper lobe, left bronchus or lung: Secondary | ICD-10-CM | POA: Diagnosis not present

## 2020-04-04 DIAGNOSIS — Z923 Personal history of irradiation: Secondary | ICD-10-CM | POA: Insufficient documentation

## 2020-04-04 DIAGNOSIS — Z95828 Presence of other vascular implants and grafts: Secondary | ICD-10-CM

## 2020-04-04 DIAGNOSIS — C3492 Malignant neoplasm of unspecified part of left bronchus or lung: Secondary | ICD-10-CM | POA: Diagnosis not present

## 2020-04-04 DIAGNOSIS — Z85118 Personal history of other malignant neoplasm of bronchus and lung: Secondary | ICD-10-CM | POA: Diagnosis not present

## 2020-04-04 DIAGNOSIS — C01 Malignant neoplasm of base of tongue: Secondary | ICD-10-CM | POA: Diagnosis not present

## 2020-04-04 LAB — CBC WITH DIFFERENTIAL/PLATELET
Abs Immature Granulocytes: 0.01 10*3/uL (ref 0.00–0.07)
Basophils Absolute: 0 10*3/uL (ref 0.0–0.1)
Basophils Relative: 0 %
Eosinophils Absolute: 0.1 10*3/uL (ref 0.0–0.5)
Eosinophils Relative: 1 %
HCT: 32.9 % — ABNORMAL LOW (ref 39.0–52.0)
Hemoglobin: 10.8 g/dL — ABNORMAL LOW (ref 13.0–17.0)
Immature Granulocytes: 0 %
Lymphocytes Relative: 14 %
Lymphs Abs: 0.8 10*3/uL (ref 0.7–4.0)
MCH: 30.1 pg (ref 26.0–34.0)
MCHC: 32.8 g/dL (ref 30.0–36.0)
MCV: 91.6 fL (ref 80.0–100.0)
Monocytes Absolute: 0.6 10*3/uL (ref 0.1–1.0)
Monocytes Relative: 11 %
Neutro Abs: 4.1 10*3/uL (ref 1.7–7.7)
Neutrophils Relative %: 74 %
Platelets: 174 10*3/uL (ref 150–400)
RBC: 3.59 MIL/uL — ABNORMAL LOW (ref 4.22–5.81)
RDW: 13.7 % (ref 11.5–15.5)
WBC: 5.6 10*3/uL (ref 4.0–10.5)
nRBC: 0 % (ref 0.0–0.2)

## 2020-04-04 LAB — MAGNESIUM: Magnesium: 1.6 mg/dL — ABNORMAL LOW (ref 1.7–2.4)

## 2020-04-04 LAB — COMPREHENSIVE METABOLIC PANEL
ALT: 19 U/L (ref 0–44)
AST: 21 U/L (ref 15–41)
Albumin: 3.8 g/dL (ref 3.5–5.0)
Alkaline Phosphatase: 63 U/L (ref 38–126)
Anion gap: 7 (ref 5–15)
BUN: 13 mg/dL (ref 8–23)
CO2: 27 mmol/L (ref 22–32)
Calcium: 9.1 mg/dL (ref 8.9–10.3)
Chloride: 106 mmol/L (ref 98–111)
Creatinine, Ser: 0.85 mg/dL (ref 0.61–1.24)
GFR, Estimated: >60 mL/min (ref >60)
Glucose, Bld: 101 mg/dL — ABNORMAL HIGH (ref 70–99)
Potassium: 4 mmol/L (ref 3.5–5.1)
Sodium: 140 mmol/L (ref 135–145)
Total Bilirubin: 1 mg/dL (ref 0.3–1.2)
Total Protein: 7.2 g/dL (ref 6.5–8.1)

## 2020-04-04 MED ORDER — TRAMADOL HCL 50 MG PO TABS
50.0000 mg | ORAL_TABLET | Freq: Four times a day (QID) | ORAL | 0 refills | Status: DC | PRN
Start: 2020-04-04 — End: 2020-05-08

## 2020-04-04 NOTE — Progress Notes (Signed)
Hematology/Oncology progress note Gregory [facility] Telephone:([phone] [phone]   Patient Care Team: Casilda Carls, MD as PCP - General (Internal Medicine) Earlie Server, [name], [name]  HISTORY OF PRESENTING ILLNESS:  Gregory [facility] is a  80 y.o.  male with PMH listed below was seen in consultation at the request of  Casilda Carls, MD  for evaluation of lymph node.  Patient moved from [place] to [place] for about 3 weeks. He has noticed a neck knot on the right side of his neck. The knot is sore and makes him to cough and he feels it when he swallows. No swallowing difficulty.  Patient was accompanied by his wife. She reports that patient's previous PCP has done neck sonogram for evaluation.  Patient also had right lower molar tooth extraction done a few weeks before he noticed  He also took a course of antibiotics.  Due to his tooth pain, his appetite has decreased and he has lost weight loss, about 20 pounds, he can not specify the time frame.   Alcoholism History of prostate cancer. S/p Radiation/seed, he follows up with [facility].   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 male who has above history reviewed by me today presents for follow-up of head and neck cancer and lung cancer Patient was accompanied by wife. Patient reports upper back pain which he noticed about 2 to 3 weeks ago.  Feels weak and fatigued.  Denies any  fecal or urinary incontinence, saddle numbness lower  extremity weakness.  He reports the pain usually triggered when he raises his arms, or stand up or bending forward No fever, chills,  dysphagia, shortness of breath cough, chest pain, abdominal pain. Patient takes magnesium supplementation for chronic hypomagnesia.  Review of Systems  Constitutional: Negative for appetite change, chills, fatigue, fever and unexpected weight change.  HENT:   Positive for hearing loss. Negative for voice change.   Eyes: Negative for eye problems and icterus.  Respiratory: Negative for chest tightness, cough and shortness of breath.   Cardiovascular: Negative for chest pain and leg swelling.  Gastrointestinal: Negative for abdominal distention, abdominal pain, blood in stool and nausea.  Endocrine: Negative for hot flashes.  Genitourinary: Negative for difficulty urinating, dysuria and frequency.   Musculoskeletal: Negative for arthralgias.  Skin: Negative for itching and rash.  Neurological: Negative for extremity weakness, light-headedness and numbness.  Hematological: Negative for adenopathy. Does not bruise/bleed easily.  Psychiatric/Behavioral: Negative for confusion.       Forgetful    MEDICAL HISTORY:  Past Medical History:  Diagnosis Date  . Alcohol abuse   . Blood transfusion without reported diagnosis 2013  . Cataract   . Dementia ([place])   . Glaucoma   . Gout   . Hypercholesteremia   . Hypertension   . 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 Prostate CA ([place]) 2008  . Squamous cell lung cancer ([place]) 06/23/2019    SURGICAL HISTORY: Past Surgical History:  Procedure Laterality Date  . BRAIN SURGERY  2012  . HERNIA REPAIR    .  PORTA CATH INSERTION N/A 07/27/2019   Procedure: PORTA CATH INSERTION;  Surgeon: Katha Cabal, MD;  Location: [facility];  Service: Cardiovascular;  Laterality: N/A;    SOCIAL HISTORY: Social History   Socioeconomic History  . Marital status: Married    Spouse name: Not on file  . Number of  children: Not on file  . Years of education: Not on file  . Highest education level: Not on file  Occupational History  . Not on file  Tobacco Use  . Smoking status: Former Smoker    Years: 21.00    Types: Cigarettes    Quit date: 05/11/2008    Years since quitting: 11.9  . Smokeless tobacco: Never Used  Vaping Use  . Vaping Use: Never used  Substance and Sexual Activity  . Alcohol use: Yes    Comment: 0.5 pint liquor a week  . Drug use: Never  . Sexual activity: Not Currently  Other Topics Concern  . Not on file  Social History Narrative   Lives at home with wife. Wife states he has some dementia but is able to sign his own consent.   Social Determinants of Health   Financial Resource Strain: Not on file  Food Insecurity: Not on file  Transportation Needs: Not on file  Physical Activity: Not on file  Stress: Not on file  Social Connections: Not on file  Intimate Partner Violence: Not on file    FAMILY HISTORY: History reviewed. No pertinent family history.  ALLERGIES:  is allergic to enalapril.  MEDICATIONS:  Current Outpatient Medications  Medication Sig Dispense Refill  . atorvastatin (LIPITOR) 20 MG tablet Take 20 mg by mouth daily.    Marland Kitchen donepezil (ARICEPT) 10 MG tablet Take 10 mg by mouth at bedtime.    . dorzolamide-timolol (COSOPT) 22.3-6.8 MG/ML ophthalmic solution Place 1 drop into both eyes 2 (two) times daily.     . folic acid (FOLVITE) 1 MG tablet Take 1 mg by mouth daily.    Marland Kitchen latanoprost (XALATAN) 0.005 % ophthalmic solution Place 1 drop into both eyes at bedtime.     . lidocaine-prilocaine (EMLA) cream Apply 1 application topically as needed. Apply small amount to port site approx 1-2 hours prior to appointment. 30 g 1  . Magnesium Chloride (MAG64) 64 MG TBEC TAKE 1 TABLET (64 MG TOTAL) BY MOUTH 2 (TWO) TIMES DAILY. 180 tablet 0  . magnesium chloride (SLOW-MAG) 64 MG TBEC SR tablet Take 1 tablet (64 mg total) by mouth 2 (two) times daily. 60 tablet 2   . Multiple Vitamins-Minerals (CENTRUM ADULTS PO) Take by mouth.    . Thiamine HCl (VITAMIN B-1 PO) Take 100 mg by mouth daily.    . traMADol (ULTRAM) 50 MG tablet Take 1 tablet (50 mg total) by mouth every 6 (six) hours as needed. 60 tablet 0  . magic mouthwash w/lidocaine SOLN Take 5 mLs by mouth 4 (four) times daily as needed for mouth pain. Sig: Swish/Swallow 5-10 ml four times a day as needed. Dispense 480 ml. 1RF (Patient not taking: No sig reported) 480 mL 1  . prednisoLONE 5 MG TABS tablet Take 5 mg by mouth daily as needed (Gout). (Patient not taking: No sig reported)    . predniSONE (DELTASONE) 5 MG tablet Take 5 mg by mouth daily with breakfast. As needed for gout flare (Patient not taking: No sig reported)    . prochlorperazine (COMPAZINE) 10 MG tablet Take 1 tablet (10 mg total) by mouth every  6 (six) hours as needed (Nausea or vomiting). (Patient not taking: No sig reported) 30 tablet 1   No current facility-administered medications for this visit.   Facility-Administered Medications Ordered in Other Visits  Medication Dose Route Frequency Provider Last Rate Last Admin  . sodium chloride flush (NS) 0.9 % injection 10 mL  10 mL Intravenous PRN Earlie Server, MD   10 mL at 03/01/20 0812     PHYSICAL EXAMINATION: ECOG PERFORMANCE STATUS: 1 - Symptomatic but completely ambulatory Vitals:   04/04/20 0947  BP: 135/82  Pulse: (!) 59  Resp: 16  Temp: 97.7 F (36.5 C)   Filed Weights   04/04/20 0947  Weight: 118 lb 3.2 oz (53.6 kg)    Physical Exam Constitutional:      General: He is not in acute distress. HENT:     Head: Normocephalic and atraumatic.  Eyes:     General: No scleral icterus. Neck:     Comments: Previously right cervical lymphadenopathy no longer palpable Cardiovascular:     Rate and Rhythm: Normal rate and regular rhythm.     Heart sounds: Normal heart sounds.  Pulmonary:     Effort: Pulmonary effort is normal. No respiratory distress.     Breath sounds:  No wheezing.  Abdominal:     General: Bowel sounds are normal. There is no distension.     Palpations: Abdomen is soft.  Musculoskeletal:        General: No deformity. Normal range of motion.     Cervical back: Normal range of motion and neck supple.  Skin:    General: Skin is warm and dry.     Findings: No erythema or rash.  Neurological:     Mental Status: He is alert. Mental status is at baseline.     Cranial Nerves: No cranial nerve deficit.     Coordination: Coordination normal.     LABORATORY DATA:  I have reviewed the data as listed Lab Results  Component Value Date   WBC 5.6 04/04/2020   HGB 10.8 (L) 04/04/2020   HCT 32.9 (L) 04/04/2020   MCV 91.6 04/04/2020   PLT 174 04/04/2020   Recent Labs    09/01/19 0751 09/08/19 0832 11/15/19 0818 12/31/19 0927 03/01/20 0802 04/04/20 0908  NA 135 140 141  --  136 140  K 3.9 4.2 4.1  --  3.8 4.0  CL 105 107 109  --  103 106  CO2 21* 25 24  --  24 27  GLUCOSE 100* 125* 98  --  102* 101*  BUN 19 23 19   --  13 13  CREATININE 0.86 0.98 0.88 1.00 1.03 0.85  CALCIUM 8.7* 9.0 9.2  --  9.3 9.1  GFRNONAA >60 >60 >60  --  >60 >60  GFRAA >60 >60 >60  --   --   --   PROT 6.9 6.8 7.0  --  7.1 7.2  ALBUMIN 3.2* 3.2* 3.9  --  4.0 3.8  AST 16 16 17   --  19 21  ALT 16 13 19   --  20 19  ALKPHOS 55 56 41  --  56 63  BILITOT 0.7 0.5 1.0  --  1.3* 1.0   Iron/TIBC/Ferritin/ %Sat No results found for: IRON, TIBC, FERRITIN, IRONPCTSAT    RADIOGRAPHIC STUDIES: I have personally reviewed the radiological images as listed and agreed with the findings in the report. CT SOFT TISSUE NECK W CONTRAST  Result Date: 03/31/2020 CLINICAL DATA:  Head neck carcinoma.  Follow-up response to treatment. EXAM: CT NECK WITH CONTRAST TECHNIQUE: Multidetector CT imaging of the neck was performed using the standard protocol following the bolus administration of intravenous contrast. CONTRAST:  9mL OMNIPAQUE IOHEXOL 300 MG/ML  SOLN COMPARISON:  CT neck  12/31/2019, 05/27/2019 FINDINGS: Pharynx and larynx: Post radiation changes in the pharynx. Previously noted mass in the right piriform sinus has resolved. Post radiation changes in the larynx without mass. Salivary glands: Hyperenhancement of the parotid and submandibular gland due to radiation change. Thyroid: Negative Lymph nodes: Necrotic right lateral retropharyngeal lymph node unchanged measuring approximately 15 mm in diameter. No new or recurrent lymphadenopathy. Vascular: Right jugular Port-A-Cath. Severe atherosclerotic calcification right carotid bifurcation and right internal carotid artery causing significant stenosis and decreased caliber of the right internal carotid artery above the stenosis. This is unchanged from prior studies. Limited intracranial: Negative Visualized orbits: Negative Mastoids and visualized paranasal sinuses: Paranasal sinuses clear. Skeleton: Destructive lesion T2 vertebral body on the right consistent with metastatic disease. This is not seen on prior studies. No fracture. No tumor in the spinal canal. Upper chest: Chest CT reported separately today. Other: None IMPRESSION: 1. No recurrent tumor right piriform sinus. Necrotic right lateral pharyngeal lymph node is stable. No new or recurrent adenopathy. 2. Lytic bone lesion T2 vertebral body on the right compatible with metastatic disease, not seen on prior studies. Electronically Signed   By: Franchot Gallo M.D.   On: 03/31/2020 13:21   CT Chest W Contrast  Result Date: 03/31/2020 CLINICAL DATA:  Head and neck cancer.  Restaging. EXAM: CT CHEST WITH CONTRAST TECHNIQUE: Multidetector CT imaging of the chest was performed during intravenous contrast administration. CONTRAST:  67mL OMNIPAQUE IOHEXOL 300 MG/ML  SOLN COMPARISON:  12/31/2019 FINDINGS: Cardiovascular: The heart size is normal. No substantial pericardial effusion. Coronary artery calcification is evident. Atherosclerotic calcification is noted in the wall of the  thoracic aorta. Right Port-A-Cath tip is positioned in the right atrium. Mediastinum/Nodes: No mediastinal lymphadenopathy. There is no hilar lymphadenopathy. The esophagus has normal imaging features. There is no axillary lymphadenopathy. 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 No suspicious pulmonary nodule or mass in the right lung. Posterior left upper lobe pleural base nodule is minimally smaller in the interval measuring 2.1 x 1.8 cm today compared to 2.3 x 2.1 cm previously. Tiny nodule seen just caudal to this dominant lesion is less evident today. No new suspicious nodule or mass in the left lung. Upper Abdomen: Unremarkable Musculoskeletal: 2.7 x 2.4 cm soft tissue lesion destroys the right aspect of the T2 vertebral body and inferior aspect of the posterior right second rib. IMPRESSION: 1. Interval development of a 2.7 cm metastatic lesion destroying the right aspect of the T2 vertebral body and inferior aspect of the posterior right second rib. 2. Minimal interval decrease in size of the posterior left upper lobe pleural based nodule with interval decrease in size of the tiny adjacent satellite nodule. 3. 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 4. Aortic Atherosclerosis (ICD10-I70.0). These results will be called to the ordering clinician or representative by the Radiologist Assistant, and communication documented in the PACS or [employer]. Electronically Signed   By: Misty Stanley M.D.   On: 03/31/2020 12:48  CT SOFT TISSUE NECK W CONTRAST  Result Date: 03/31/2020 CLINICAL DATA:  Head neck carcinoma. Follow-up response to treatment. EXAM: CT NECK WITH CONTRAST TECHNIQUE: Multidetector CT imaging of the neck was performed using the standard protocol following the  bolus administration of intravenous contrast. CONTRAST:  13mL OMNIPAQUE IOHEXOL 300 MG/ML  SOLN COMPARISON:  CT neck 12/31/2019, 05/27/2019 FINDINGS:  Pharynx and larynx: Post radiation changes in the pharynx. Previously noted mass in the right piriform sinus has resolved. Post radiation changes in the larynx without mass. Salivary glands: Hyperenhancement of the parotid and submandibular gland due to radiation change. Thyroid: Negative Lymph nodes: Necrotic right lateral retropharyngeal lymph node unchanged measuring approximately 15 mm in diameter. No new or recurrent lymphadenopathy. Vascular: Right jugular Port-A-Cath. Severe atherosclerotic calcification right carotid bifurcation and right internal carotid artery causing significant stenosis and decreased caliber of the right internal carotid artery above the stenosis. This is unchanged from prior studies. Limited intracranial: Negative Visualized orbits: Negative Mastoids and visualized paranasal sinuses: Paranasal sinuses clear. Skeleton: Destructive lesion T2 vertebral body on the right consistent with metastatic disease. This is not seen on prior studies. No fracture. No tumor in the spinal canal. Upper chest: Chest CT reported separately today. Other: None IMPRESSION: 1. No recurrent tumor right piriform sinus. Necrotic right lateral pharyngeal lymph node is stable. No new or recurrent adenopathy. 2. Lytic bone lesion T2 vertebral body on the right compatible with metastatic disease, not seen on prior studies. Electronically Signed   By: Franchot Gallo M.D.   On: 03/31/2020 13:21   CT Chest W Contrast  Result Date: 03/31/2020 CLINICAL DATA:  Head and neck cancer.  Restaging. EXAM: CT CHEST WITH CONTRAST TECHNIQUE: Multidetector CT imaging of the chest was performed during intravenous contrast administration. CONTRAST:  68mL OMNIPAQUE IOHEXOL 300 MG/ML  SOLN COMPARISON:  12/31/2019 FINDINGS: Cardiovascular: The heart size is normal. No substantial pericardial effusion. Coronary artery calcification is evident. Atherosclerotic calcification is noted in the wall of the thoracic aorta. Right Port-A-Cath  tip is positioned in the right atrium. Mediastinum/Nodes: No mediastinal lymphadenopathy. There is no hilar lymphadenopathy. The esophagus has normal imaging features. There is no axillary lymphadenopathy. 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 No suspicious pulmonary nodule or mass in the right lung. Posterior left upper lobe pleural base nodule is minimally smaller in the interval measuring 2.1 x 1.8 cm today compared to 2.3 x 2.1 cm previously. Tiny nodule seen just caudal to this dominant lesion is less evident today. No new suspicious nodule or mass in the left lung. Upper Abdomen: Unremarkable Musculoskeletal: 2.7 x 2.4 cm soft tissue lesion destroys the right aspect of the T2 vertebral body and inferior aspect of the posterior right second rib. IMPRESSION: 1. Interval development of a 2.7 cm metastatic lesion destroying the right aspect of the T2 vertebral body and inferior aspect of the posterior right second rib. 2. Minimal interval decrease in size of the posterior left upper lobe pleural based nodule with interval decrease in size of the tiny adjacent satellite nodule. 3. 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 4. Aortic Atherosclerosis (ICD10-I70.0). These results will be called to the ordering clinician or representative by the Radiologist Assistant, and communication documented in the PACS or [employer]. Electronically Signed   By: Misty Stanley M.D.   On: 03/31/2020 12:48       ASSESSMENT & PLAN:  1. 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 Squamous cell carcinoma of left lung ([place])   3. Port-A-Cath in place   4. Hypomagnesemia   5. 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 Interval CT neck soft tissue as well as chest images were independently reviewed by me and discussed with patient and wife. Interval development of 2.7 x 2.4 soft tissue lesion destroying the right aspect of the T2 vertebral body and inferior aspect of the posterior right second rib.  Interval decrease of the posterior left upper lobe pleural-based nodule with interval decrease in size of the tiny adjacent satellite nodule.  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  No recurrent tumor in right piriform sinus.  Right lateral pharyngeal lymph node is stable.  No new or recurrent adenopathy. Suspect metastasis.  I recommend patient to obtain PET scan restaging.  I discussed about the plan of re-biopsy of either the paraspinal soft tissue mass versus other more feasible hypermetabolic sites detected on PET scan.  Patient will see radiation oncology today.  Patient will need palliative radiation. Further chemotherapy management pending on PET scan and rebiopsy.  #Neoplasm related pain, recommend patient to start tramadol 50 mg every 6 hours as needed.  Pain regimen will be titrated according to the level of his pain control.  Potential side effects of narcotics were discussed with patient and wife and both  are agreeable starting  with pain medication.  Prescription was sent to pharmacy.   #Severe chronic hypomagnesemia, secondary to chronic alcoholism as well as recent cisplatin use. Magnesium level is 1.6 which is at his baseline.  Recommend patient to continue slow magnesium 1 tablet twice  daily.  Refills were sent.  #Port-A-Cath in place, recommend port flush every 8 weeks.  All questions were answered. The patient knows to call the clinic with any problems questions or concerns.   Return of visit: After PET scan.  To be determined.  Earlie Server, MD, PhD Hematology Oncology [facility] Pager- [phone] 04/04/2020

## 2020-04-04 NOTE — Progress Notes (Signed)
Patient has an increase in weakness feeling.  Also having back pain that starts at his shoulder and radiates down his spine (9/10 pain scale).

## 2020-04-04 NOTE — Progress Notes (Signed)
Radiation Oncology Follow up Note old patient new area T2 metastasis  Name: Gregory Burton   Date:   04/04/2020 MRN:  [id number] DOB: 1940/11/26    This 80 y.o. male presents to the clinic today for reevaluation of metastatic disease to T2 and patient with known stage IVb (T4N2B M0) squamous cell carcinoma the base of tongue now out 6 months also previously treated to a left upper lobe pleural-based lesion consistent with known squamous cell carcinoma treated with SBRT.  REFERRING PROVIDER: Casilda Carls, MD  HPI: Patient is a 80 year old male well-known to our department having been treated over 6 months prior with concurrent chemo radiation for stage IVb squamous cell carcinoma the base of tongue.  We also treated a lesion with SBRT to his left upper lobe.  He recently had a repeat CT scan showing.  A 2.7 metastatic lesion destroying the right aspect of the T2 vertebral body and inferior aspect of the posterior right second rib.  Area of the posterior left upper lobe pleural-based nodule was stable.  Patient is scheduled for a PET CT scan in about 2 weeks.  He is having neck pain.  No evidence of loss of motor or sensory function is in the upper and lower extremities.  He is swallowing well having no significant head and neck pain.  COMPLICATIONS OF TREATMENT: none  FOLLOW UP COMPLIANCE: keeps appointments   PHYSICAL EXAM:  There were no vitals taken for this visit. Motor or sensory and DTRs are equal symmetric in upper lower extremities.  Pain is elicited on rotation of his head and neck.  Well-developed well-nourished patient in NAD. HEENT reveals PERLA, EOMI, discs not visualized.  Oral cavity is clear. No oral mucosal lesions are identified. Neck is clear without evidence of cervical or supraclavicular adenopathy. Lungs are clear to A&P. Cardiac examination is essentially unremarkable with regular rate and rhythm without murmur rub or thrill. Abdomen is benign with no organomegaly or masses  noted. Motor sensory and DTR levels are equal and symmetric in the upper and lower extremities. Cranial nerves II through XII are grossly intact. Proprioception is intact. No peripheral adenopathy or edema is identified. No motor or sensory levels are noted. Crude visual fields are within normal range.  RADIOLOGY RESULTS: CT scan is reviewed PET CT scan has been ordered  PLAN: At this time based on the aggressive nature of the T2 vertebral body destruction would start palliative radiation therapy.  We will plan on delivering 30 Gray in 10 fractions.  I have personally set up and ordered CT simulation.  We will also review his PET CT scan in future although there are no areas at this time of bone pain.  We can always touch up other areas that are significant with palliative radiation therapy in the future.  Patient and his wife seem to comprehend my recommendations well.  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  I would like to take this opportunity to thank you for allowing me to participate in the care of your patient.Noreene Filbert, MD

## 2020-04-06 ENCOUNTER — Telehealth: Payer: Self-pay | Admitting: *Deleted

## 2020-04-06 ENCOUNTER — Ambulatory Visit
Admission: RE | Admit: 2020-04-06 | Discharge: 2020-04-06 | Disposition: A | Discharge status: Home / Self Care | Payer: Medicare Other | Source: Ambulatory Visit | Attending: Radiation Oncology | Admitting: Radiation Oncology

## 2020-04-06 DIAGNOSIS — C01 Malignant neoplasm of base of tongue: Secondary | ICD-10-CM | POA: Insufficient documentation

## 2020-04-06 DIAGNOSIS — C139 Malignant neoplasm of hypopharynx, unspecified: Secondary | ICD-10-CM | POA: Diagnosis present

## 2020-04-06 NOTE — Telephone Encounter (Signed)
Cancel the sched 04/18/20  PET scan per Dr.Yu 04/06/20 [place] message  I was unable to reach pt wife Namoi by phone. A detailed message was left on her VM making her aware that the 04/18/20 PET had been cx per MD

## 2020-04-07 ENCOUNTER — Other Ambulatory Visit: Payer: Self-pay | Admitting: *Deleted

## 2020-04-07 DIAGNOSIS — C7951 Secondary malignant neoplasm of bone: Secondary | ICD-10-CM

## 2020-04-10 DIAGNOSIS — C01 Malignant neoplasm of base of tongue: Secondary | ICD-10-CM | POA: Diagnosis not present

## 2020-04-11 ENCOUNTER — Ambulatory Visit: Admission: RE | Admit: 2020-04-11 | Payer: Medicare Other | Source: Ambulatory Visit

## 2020-04-11 ENCOUNTER — Inpatient Hospital Stay: Payer: Medicare Other

## 2020-04-11 DIAGNOSIS — C01 Malignant neoplasm of base of tongue: Secondary | ICD-10-CM | POA: Diagnosis not present

## 2020-04-12 ENCOUNTER — Ambulatory Visit
Admission: RE | Admit: 2020-04-12 | Discharge: 2020-04-12 | Disposition: A | Discharge status: Home / Self Care | Payer: Medicare Other | Source: Ambulatory Visit | Attending: Radiation Oncology | Admitting: Radiation Oncology

## 2020-04-12 ENCOUNTER — Other Ambulatory Visit: Payer: Self-pay

## 2020-04-12 ENCOUNTER — Inpatient Hospital Stay: Payer: Medicare Other

## 2020-04-12 DIAGNOSIS — C01 Malignant neoplasm of base of tongue: Secondary | ICD-10-CM | POA: Diagnosis not present

## 2020-04-13 ENCOUNTER — Inpatient Hospital Stay: Payer: Medicare Other

## 2020-04-13 ENCOUNTER — Ambulatory Visit
Admission: RE | Admit: 2020-04-13 | Discharge: 2020-04-13 | Disposition: A | Discharge status: Home / Self Care | Payer: Medicare Other | Source: Ambulatory Visit | Attending: Radiation Oncology | Admitting: Radiation Oncology

## 2020-04-13 DIAGNOSIS — C01 Malignant neoplasm of base of tongue: Secondary | ICD-10-CM | POA: Diagnosis not present

## 2020-04-14 ENCOUNTER — Inpatient Hospital Stay: Payer: Medicare Other

## 2020-04-14 ENCOUNTER — Other Ambulatory Visit: Payer: Self-pay

## 2020-04-14 ENCOUNTER — Ambulatory Visit
Admission: RE | Admit: 2020-04-14 | Discharge: 2020-04-14 | Disposition: A | Discharge status: Home / Self Care | Payer: Medicare Other | Source: Ambulatory Visit | Attending: Radiation Oncology | Admitting: Radiation Oncology

## 2020-04-14 DIAGNOSIS — C01 Malignant neoplasm of base of tongue: Secondary | ICD-10-CM | POA: Diagnosis not present

## 2020-04-17 ENCOUNTER — Inpatient Hospital Stay: Payer: Medicare Other

## 2020-04-17 ENCOUNTER — Ambulatory Visit
Admission: RE | Admit: 2020-04-17 | Discharge: 2020-04-17 | Disposition: A | Discharge status: Home / Self Care | Payer: Medicare Other | Source: Ambulatory Visit | Attending: Radiation Oncology | Admitting: Radiation Oncology

## 2020-04-17 DIAGNOSIS — C01 Malignant neoplasm of base of tongue: Secondary | ICD-10-CM | POA: Diagnosis not present

## 2020-04-18 ENCOUNTER — Ambulatory Visit
Admission: RE | Admit: 2020-04-18 | Discharge: 2020-04-18 | Disposition: A | Discharge status: Home / Self Care | Payer: Medicare Other | Source: Ambulatory Visit | Attending: Radiation Oncology | Admitting: Radiation Oncology

## 2020-04-18 DIAGNOSIS — C01 Malignant neoplasm of base of tongue: Secondary | ICD-10-CM | POA: Diagnosis not present

## 2020-04-19 ENCOUNTER — Ambulatory Visit
Admission: RE | Admit: 2020-04-19 | Discharge: 2020-04-19 | Disposition: A | Discharge status: Home / Self Care | Payer: Medicare Other | Source: Ambulatory Visit | Attending: Radiation Oncology | Admitting: Radiation Oncology

## 2020-04-19 ENCOUNTER — Other Ambulatory Visit: Payer: Self-pay

## 2020-04-19 ENCOUNTER — Inpatient Hospital Stay: Payer: Medicare Other

## 2020-04-19 DIAGNOSIS — C01 Malignant neoplasm of base of tongue: Secondary | ICD-10-CM | POA: Diagnosis not present

## 2020-04-19 DIAGNOSIS — C7951 Secondary malignant neoplasm of bone: Secondary | ICD-10-CM

## 2020-04-19 LAB — CBC
HCT: 34.8 % — ABNORMAL LOW (ref 39.0–52.0)
Hemoglobin: 11.1 g/dL — ABNORMAL LOW (ref 13.0–17.0)
MCH: 30 pg (ref 26.0–34.0)
MCHC: 31.9 g/dL (ref 30.0–36.0)
MCV: 94.1 fL (ref 80.0–100.0)
Platelets: 212 10*3/uL (ref 150–400)
RBC: 3.7 MIL/uL — ABNORMAL LOW (ref 4.22–5.81)
RDW: 13 % (ref 11.5–15.5)
WBC: 5.7 10*3/uL (ref 4.0–10.5)
nRBC: 0 % (ref 0.0–0.2)

## 2020-04-20 ENCOUNTER — Inpatient Hospital Stay: Payer: Medicare Other

## 2020-04-20 ENCOUNTER — Ambulatory Visit
Admission: RE | Admit: 2020-04-20 | Discharge: 2020-04-20 | Disposition: A | Discharge status: Home / Self Care | Payer: Medicare Other | Source: Ambulatory Visit | Attending: Radiation Oncology | Admitting: Radiation Oncology

## 2020-04-20 DIAGNOSIS — C01 Malignant neoplasm of base of tongue: Secondary | ICD-10-CM | POA: Diagnosis not present

## 2020-04-21 ENCOUNTER — Other Ambulatory Visit: Payer: Self-pay

## 2020-04-21 ENCOUNTER — Ambulatory Visit
Admission: RE | Admit: 2020-04-21 | Discharge: 2020-04-21 | Disposition: A | Discharge status: Home / Self Care | Payer: Medicare Other | Source: Ambulatory Visit | Attending: Radiation Oncology | Admitting: Radiation Oncology

## 2020-04-21 ENCOUNTER — Inpatient Hospital Stay: Payer: Medicare Other

## 2020-04-21 DIAGNOSIS — C01 Malignant neoplasm of base of tongue: Secondary | ICD-10-CM | POA: Diagnosis not present

## 2020-04-24 ENCOUNTER — Inpatient Hospital Stay: Payer: Medicare Other

## 2020-04-24 ENCOUNTER — Ambulatory Visit
Admission: RE | Admit: 2020-04-24 | Discharge: 2020-04-24 | Disposition: A | Discharge status: Home / Self Care | Payer: Medicare Other | Source: Ambulatory Visit | Attending: Radiation Oncology | Admitting: Radiation Oncology

## 2020-04-24 DIAGNOSIS — C01 Malignant neoplasm of base of tongue: Secondary | ICD-10-CM | POA: Diagnosis not present

## 2020-04-25 ENCOUNTER — Ambulatory Visit
Admission: RE | Admit: 2020-04-25 | Discharge: 2020-04-25 | Disposition: A | Discharge status: Home / Self Care | Payer: Medicare Other | Source: Ambulatory Visit | Attending: Radiation Oncology | Admitting: Radiation Oncology

## 2020-04-25 ENCOUNTER — Inpatient Hospital Stay: Payer: Medicare Other | Attending: Oncology

## 2020-04-25 DIAGNOSIS — C139 Malignant neoplasm of hypopharynx, unspecified: Secondary | ICD-10-CM | POA: Insufficient documentation

## 2020-04-25 DIAGNOSIS — C01 Malignant neoplasm of base of tongue: Secondary | ICD-10-CM | POA: Diagnosis present

## 2020-05-08 ENCOUNTER — Other Ambulatory Visit: Payer: Self-pay

## 2020-05-08 ENCOUNTER — Telehealth: Payer: Self-pay

## 2020-05-08 ENCOUNTER — Encounter: Payer: Self-pay | Admitting: Nurse Practitioner

## 2020-05-08 ENCOUNTER — Inpatient Hospital Stay (HOSPITAL_BASED_OUTPATIENT_CLINIC_OR_DEPARTMENT_OTHER): Payer: Medicare Other | Admitting: Nurse Practitioner

## 2020-05-08 DIAGNOSIS — M79602 Pain in left arm: Secondary | ICD-10-CM

## 2020-05-08 MED ORDER — OXYCODONE HCL 5 MG PO TABS
5.0000 mg | ORAL_TABLET | ORAL | 0 refills | Status: DC | PRN
Start: 2020-05-08 — End: 2021-06-19

## 2020-05-08 NOTE — Telephone Encounter (Signed)
Called and spoke to patient's wife and instructed her that X-Rays of left arm have been ordered by NP, and that patient needs to go to [facility] to have them done. [facility] address and phone number provided.  Patient's wife stated that she would try to get him there tomorrow.

## 2020-05-08 NOTE — Progress Notes (Signed)
Virtual Visit Progress Note  [facility]  [phone] [phone]  I connected with Evalee Mutton on 05/08/20 at 10:30 AM EDT by telephone visit and verified that I am speaking with the correct person using two identifiers.   I discussed the limitations, risks, security and privacy concerns of performing an evaluation and management service by telemedicine and the availability of in-person appointments. I also discussed with the patient that there may be a patient responsible charge related to this service. The patient expressed understanding and agreed to proceed.   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[place])   06/23/2019 Cancer Staging   Staging form: Pharynx - P16 Negative Oropharynx, AJCC 8th Edition - Clinical stage from 06/23/2019: Stage IVB (cT4b, cN2b, cM0, p16-) - Signed by Earlie Server, MD on 06/23/2019   07/19/2019 -  Chemotherapy   The patient had palonosetron (ALOXI) injection 0.25 mg, 0.25 mg, Intravenous,  Once, 5 of 7 cycles Administration: 0.25 mg (07/19/2019), 0.25 mg (07/28/2019), 0.25 mg (08/04/2019), 0.25 mg (08/11/2019), 0.25 mg (08/18/2019) CISplatin (PLATINOL) 75 mg in sodium chloride 0.9 % 250 mL chemo infusion, 62 mg, Intravenous,  Once, 5 of 7 cycles Administration: 75 mg (07/19/2019), 62 mg (08/04/2019), 62 mg (08/11/2019), 62 mg (08/18/2019) fosaprepitant (EMEND) 150 mg in sodium chloride 0.9  % 145 mL IVPB, 150 mg, Intravenous,  Once, 5 of 7 cycles Administration: 150 mg (07/19/2019), 150 mg (07/28/2019), 150 mg (08/04/2019), 150 mg (08/11/2019), 150 mg (08/18/2019)  for chemotherapy treatment.    Squamous cell lung cancer ([place])  06/23/2019 Initial Diagnosis   Squamous cell lung cancer ([place])   07/19/2019 Cancer Staging   Staging form: Lung, AJCC 8th Edition - Clinical: cT1, cN0, cM0 - Signed by Earlie Server, MD on 07/19/2019     Interval history-patient is 80 year old male diagnosed with squamous cell carcinoma of the lung, stage IV, who presents to symptom management clinic for concerns of left arm pain.  Per patient's wife, he has had this pain for greater than 1 month.  Previous imaging showed aggressive osseous lesion at T2 for which she received palliative radiation completed on 04/25/2020.  Since completing radiation patient feels his pain is gradually worsening.  Localizes pain to the left shoulder, left bicep radiates down his left hand.  Pain worsens with movement.  He has been taking 1 tablet of tramadol.  Over the weekend, when pain worsened, he contacted cancer center and increased dose to 2 tablets of tramadol.  Today, wife says pain continues to be unrelieved.  Otherwise feels at baseline.  Tolerating medication well.  Review of systems- Review of Systems  Reason unable to perform ROS: ROS provided by patient & his wife.  Constitutional: Positive for malaise/fatigue. Negative for chills, diaphoresis, fever and weight loss.  HENT: Negative for congestion, ear discharge, ear pain, nosebleeds, sinus pain and sore throat.   Eyes: Negative for pain and redness.  Respiratory: Negative for cough, hemoptysis, sputum production, shortness of breath,  wheezing and stridor.   Cardiovascular: Negative for chest pain, palpitations and leg swelling.  Gastrointestinal: Negative for abdominal pain, constipation, diarrhea, nausea and vomiting.  Musculoskeletal: Positive for joint pain. Negative for  falls and myalgias.       Per hpi  Skin: Negative for rash.  Neurological: Negative for dizziness, loss of consciousness, weakness and headaches.  Psychiatric/Behavioral: Positive for memory loss. Negative for depression. The patient is not nervous/anxious and does not have insomnia.   All other systems reviewed and are negative.    Allergies  Allergen Reactions  . Enalapril Rash    Past Medical History:  Diagnosis Date  . Alcohol abuse   . Blood transfusion without reported diagnosis 2013  . Cataract   . Dementia ([place])   . Glaucoma   . Gout   . Hypercholesteremia   . Hypertension   . 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 Prostate CA ([place]) 2008  . Squamous cell lung cancer ([place]) 06/23/2019    Past Surgical History:  Procedure Laterality Date  . BRAIN SURGERY  2012  . HERNIA REPAIR    . PORTA CATH INSERTION N/A 07/27/2019   Procedure: PORTA CATH INSERTION;  Surgeon: Katha Cabal, MD;  Location: [facility];  Service: Cardiovascular;  Laterality: N/A;    Social History   Socioeconomic History  . Marital status: Married    Spouse name: Not on file  . Number of children: Not on file  . Years of education: Not on file  . Highest education level: Not on file  Occupational History  . Not on file  Tobacco Use  . Smoking status: Former Smoker    Years: 21.00    Types: Cigarettes    Quit date: 05/11/2008    Years since quitting: 12.0  . Smokeless tobacco: Never Used  Vaping Use  . Vaping Use: Never used  Substance and Sexual Activity  . Alcohol use: Yes    Comment: 0.5 pint liquor a week  . Drug use: Never  . Sexual activity: Not Currently  Other Topics Concern  . Not on file  Social History Narrative   Lives at home with wife. Wife states he has some dementia but is able to sign his own consent.   Social Determinants of Health   Financial Resource Strain: Not on file  Food Insecurity: Not on file  Transportation Needs: Not on file  Physical  Activity: Not on file  Stress: Not on file  Social Connections: Not on file  Intimate Partner Violence: Not on file    No family history on file.   Current Outpatient Medications:  .  atorvastatin (LIPITOR) 20 MG tablet, Take 20 mg by mouth daily., Disp: , Rfl:  .  donepezil (ARICEPT) 10 MG tablet, Take 10 mg by mouth at bedtime., Disp: , Rfl:  .  dorzolamide-timolol (COSOPT) 22.3-6.8 MG/ML ophthalmic solution, Place 1 drop into both eyes 2 (two) times daily. , Disp: , Rfl:  .  folic acid (FOLVITE) 1 MG tablet, Take 1 mg by mouth daily., Disp: , Rfl:  .  latanoprost (XALATAN) 0.005 % ophthalmic solution, Place 1 drop into both eyes at bedtime. , Disp: , Rfl:  .  lidocaine-prilocaine (EMLA) cream, Apply 1 application topically as needed. Apply small amount to port site approx 1-2 hours prior to appointment., Disp: 30 g, Rfl: 1 .  magic mouthwash w/lidocaine SOLN, Take 5 mLs by mouth 4 (four) times daily as needed for mouth pain. Sig: Swish/Swallow 5-10 ml four  times a day as needed. Dispense 480 ml. 1RF (Patient not taking: No sig reported), Disp: 480 mL, Rfl: 1 .  Magnesium Chloride (MAG64) 64 MG TBEC, TAKE 1 TABLET (64 MG TOTAL) BY MOUTH 2 (TWO) TIMES DAILY., Disp: 180 tablet, Rfl: 0 .  magnesium chloride (SLOW-MAG) 64 MG TBEC SR tablet, Take 1 tablet (64 mg total) by mouth 2 (two) times daily., Disp: 60 tablet, Rfl: 2 .  Multiple Vitamins-Minerals (CENTRUM ADULTS PO), Take by mouth., Disp: , Rfl:  .  prednisoLONE 5 MG TABS tablet, Take 5 mg by mouth daily as needed (Gout). (Patient not taking: No sig reported), Disp: , Rfl:  .  predniSONE (DELTASONE) 5 MG tablet, Take 5 mg by mouth daily with breakfast. As needed for gout flare (Patient not taking: No sig reported), Disp: , Rfl:  .  prochlorperazine (COMPAZINE) 10 MG tablet, Take 1 tablet (10 mg total) by mouth every 6 (six) hours as needed (Nausea or vomiting). (Patient not taking: No sig reported), Disp: 30 tablet, Rfl: 1 .  Thiamine HCl  (VITAMIN B-1 PO), Take 100 mg by mouth daily., Disp: , Rfl:  .  traMADol (ULTRAM) 50 MG tablet, Take 1 tablet (50 mg total) by mouth every 6 (six) hours as needed., Disp: 60 tablet, Rfl: 0 No current facility-administered medications for this visit.  Facility-Administered Medications Ordered in Other Visits:  .  sodium chloride flush (NS) 0.9 % injection 10 mL, 10 mL, Intravenous, PRN, Earlie Server, MD, 10 mL at 03/01/20 1761  Physical exam: Exam limited due to telemedicine  Physical Exam Neurological:     Mental Status: He is alert.  Psychiatric:        Mood and Affect: Mood normal.      CMP Latest Ref Rng & Units 04/04/2020  Glucose 70 - 99 mg/dL 101(H)  BUN 8 - 23 mg/dL 13  Creatinine 0.61 - 1.24 mg/dL 0.85  Sodium 135 - 145 mmol/L 140  Potassium 3.5 - 5.1 mmol/L 4.0  Chloride 98 - 111 mmol/L 106  CO2 22 - 32 mmol/L 27  Calcium 8.9 - 10.3 mg/dL 9.1  Total Protein 6.5 - 8.1 g/dL 7.2  Total Bilirubin 0.3 - 1.2 mg/dL 1.0  Alkaline Phos 38 - 126 U/L 63  AST 15 - 41 U/L 21  ALT 0 - 44 U/L 19   CBC Latest Ref Rng & Units 04/19/2020  WBC 4.0 - 10.5 K/uL 5.7  Hemoglobin 13.0 - 17.0 g/dL 11.1(L)  Hematocrit 39.0 - 52.0 % 34.8(L)  Platelets 150 - 400 K/uL 212    No images are attached to the encounter.  No results found.  Assessment and plan- Patient is a 80 y.o. 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 Patient poor historian and exam limited d/t telemedicine. Unclear if pain originating from known T2 lesion radiating to left arm or if possible site of metastatic disease. Will get xrays to evaluate further. In the interim, will change pain medication to oxycodone 5 mg. Bowel prophylaxis reviewed. Follow up based on results. If worsening or unrelieved symptoms in the interim, return to clinic.   Nursing to coordinate  transportation on behalf of patient.    Visit Diagnosis 1. Left arm pain     Patient expressed understanding and was in agreement with this plan. He also understands that He can call clinic at any time  with any questions, concerns, or complaints.   I discussed the assessment and treatment plan with the patient. The patient was provided an opportunity to ask questions and all were answered. The patient agreed with the plan and demonstrated an understanding of the instructions.   The patient was advised to call back or seek an in-person evaluation if the symptoms worsen or if the condition fails to improve as anticipated.   I spent 20 minutes on this telephone encounter.   Thank you for allowing me to participate in the care of this very pleasant patient.   Beckey Rutter, DNP, AGNP-C [facility]

## 2020-05-09 ENCOUNTER — Telehealth: Payer: Self-pay

## 2020-05-09 DIAGNOSIS — C109 Malignant neoplasm of oropharynx, unspecified: Secondary | ICD-10-CM

## 2020-05-09 DIAGNOSIS — M79602 Pain in left arm: Secondary | ICD-10-CM

## 2020-05-09 DIAGNOSIS — C3492 Malignant neoplasm of unspecified part of left bronchus or lung: Secondary | ICD-10-CM

## 2020-05-09 NOTE — Telephone Encounter (Signed)
Patient's wife aware of MRI tomorrow and Lucianne Lei pickup at 7:15 am.

## 2020-05-09 NOTE — Telephone Encounter (Signed)
[name] requested STAT PET but the first available is 05/22/20.  [name] would like to obtain a STAT MRI of thoracic spine due to lesion in his thoracic spine which is causing his increased arm/shoulder pain.

## 2020-05-09 NOTE — Telephone Encounter (Signed)
Done STAT MRI has been sched as requested per [name]. A detailed message was left on pts wife VM making her aware. MRI is sched for  3/16 @ 8am with Lucianne Lei pick up @ 7:15 I will continue to try to contact his wife in hopes to speak with her to make sure  She received the message left.

## 2020-05-10 ENCOUNTER — Ambulatory Visit
Admission: RE | Admit: 2020-05-10 | Discharge: 2020-05-10 | Disposition: A | Discharge status: Home / Self Care | Payer: Medicare Other | Source: Ambulatory Visit | Attending: Oncology | Admitting: Oncology

## 2020-05-10 ENCOUNTER — Telehealth: Payer: Self-pay | Admitting: *Deleted

## 2020-05-10 ENCOUNTER — Other Ambulatory Visit: Payer: Self-pay

## 2020-05-10 DIAGNOSIS — C109 Malignant neoplasm of oropharynx, unspecified: Secondary | ICD-10-CM | POA: Diagnosis present

## 2020-05-10 DIAGNOSIS — M79602 Pain in left arm: Secondary | ICD-10-CM | POA: Insufficient documentation

## 2020-05-10 DIAGNOSIS — C3492 Malignant neoplasm of unspecified part of left bronchus or lung: Secondary | ICD-10-CM | POA: Diagnosis present

## 2020-05-10 IMAGING — MR MR THORACIC SPINE WO/W CM
6 of 9 series · 27 of 48 positions shown · IV contrast (gadavist)
Comparison: Chest CT [DATE].

CLINICAL DATA: History of head and neck cancer and lung cancer.
Right shoulder pain. Lesion in the thoracic spine at T2 on chest CT
[DATE].

EXAM:
MRI THORACIC WITHOUT AND WITH CONTRAST
TECHNIQUE: Multiplanar and multiecho pulse sequences of the thoracic spine were
obtained without and with intravenous contrast.
CONTRAST:  5 mL GADAVIST IV SOLN

[Series 16: T1 · sagittal · 5.0mm · 1.41mm/px · 1 of 9 slices shown (1 of 3)]
[im 1/9]
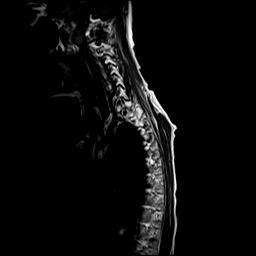

[Series 17: T2 · sagittal · 3.0mm · 1.06mm/px · 3 of 18 slices shown (1 of 2)]
[im 1/18]
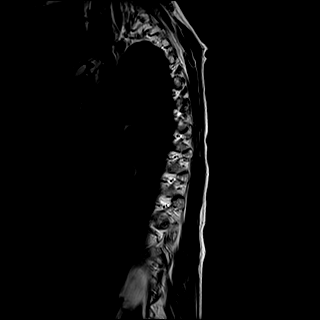
[im 9/18]
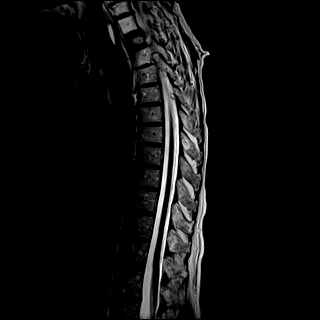
[im 18/18]
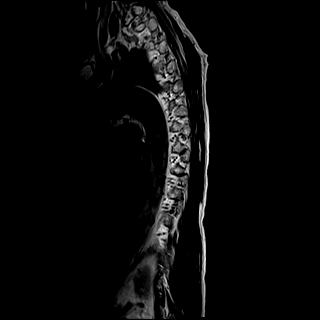

[Series 18: T1 · sagittal · 3.0mm · 1.06mm/px · 4 of 18 slices shown (2 of 3)]
[im 1/18]
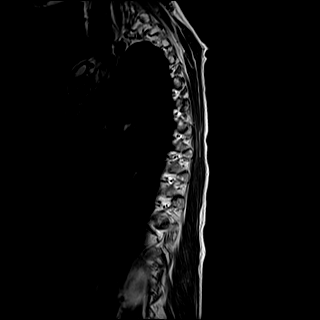
[im 6/18]
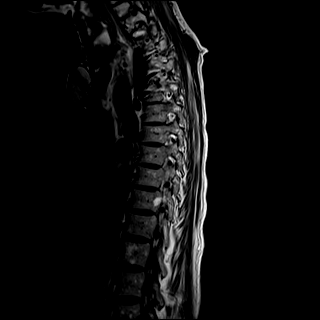
[im 12/18]
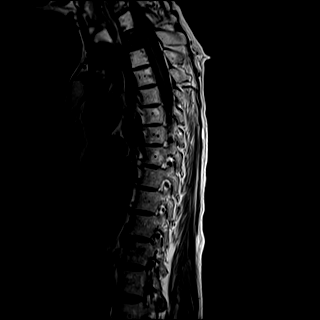
[im 18/18]
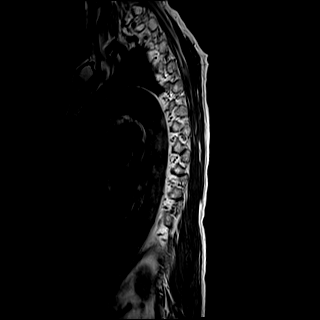

[Series 20: T2 · axial · 4.0mm · 0.59mm/px · z∈[-208,-0]mm · 8 of 37 slices shown (2 of 2)]
[im 1/37]
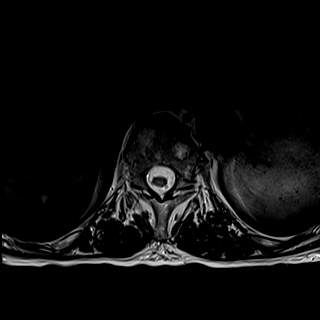
[im 6/37]
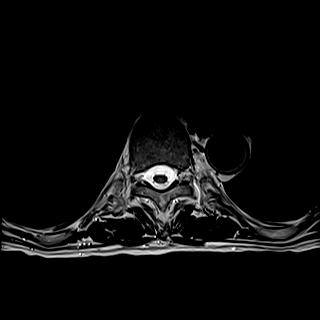
[im 11/37]
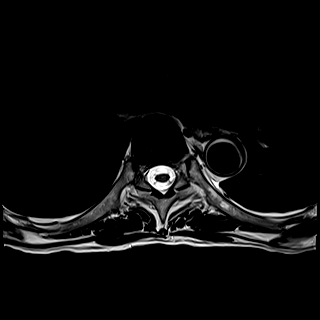
[im 16/37]
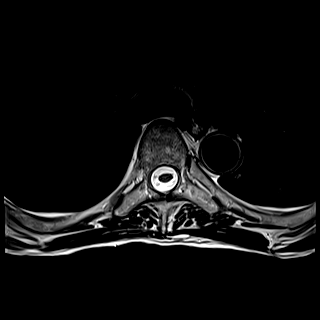
[im 21/37]
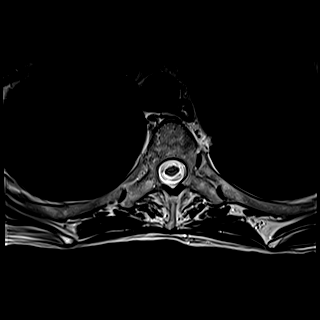
[im 26/37]
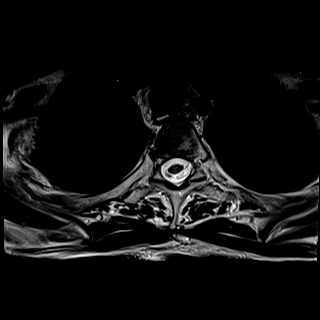
[im 31/37]
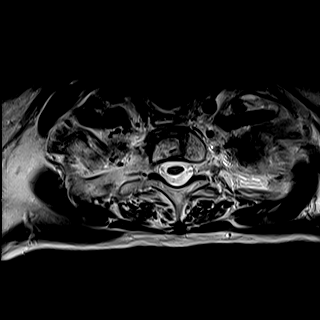
[im 37/37]
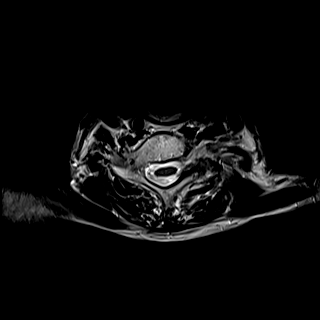

[Series 22: T1 · axial · non-contrast · 4.0mm · 0.31mm/px · z∈[-208,-0]mm · 8 of 37 slices shown (3 of 3)]
[im 1/37]
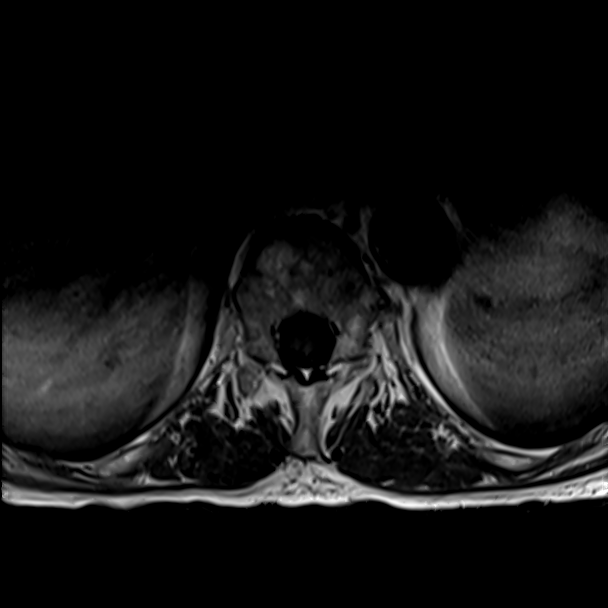
[im 6/37]
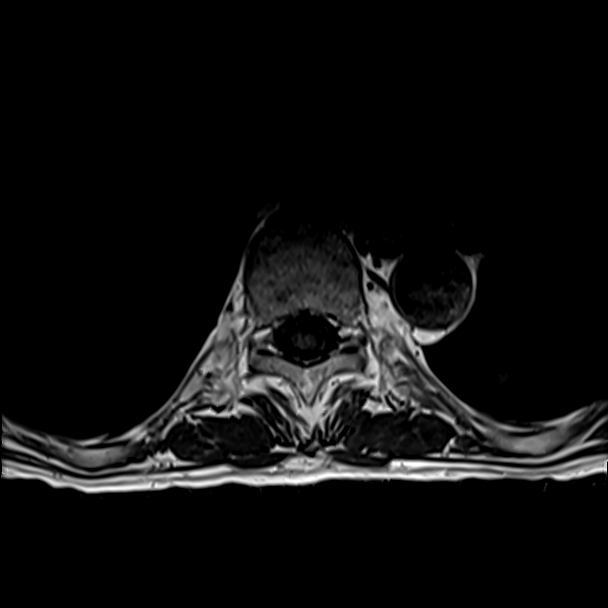
[im 11/37]
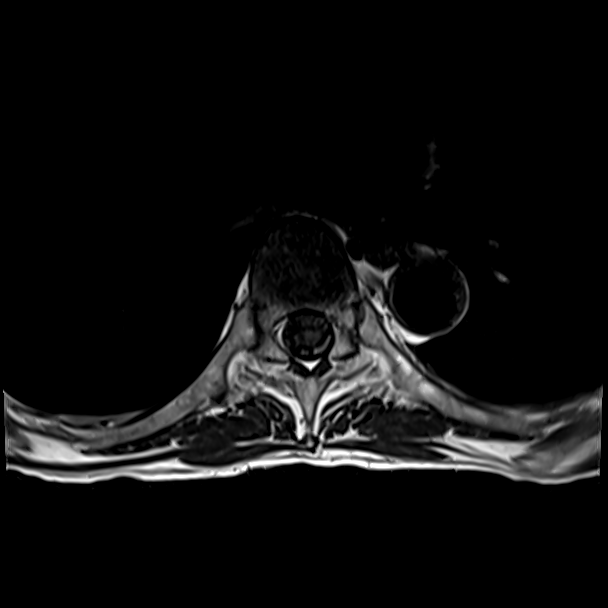
[im 16/37]
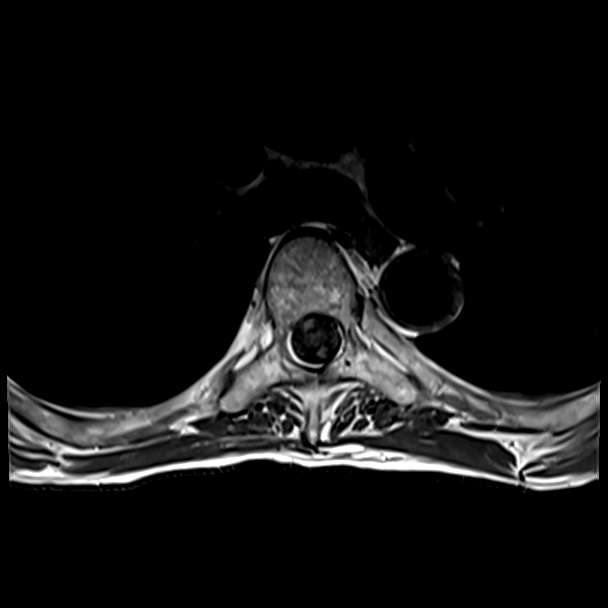
[im 21/37]
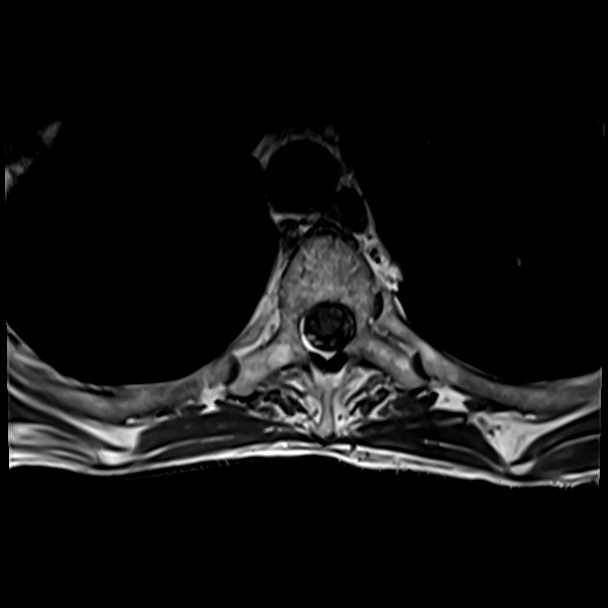
[im 26/37]
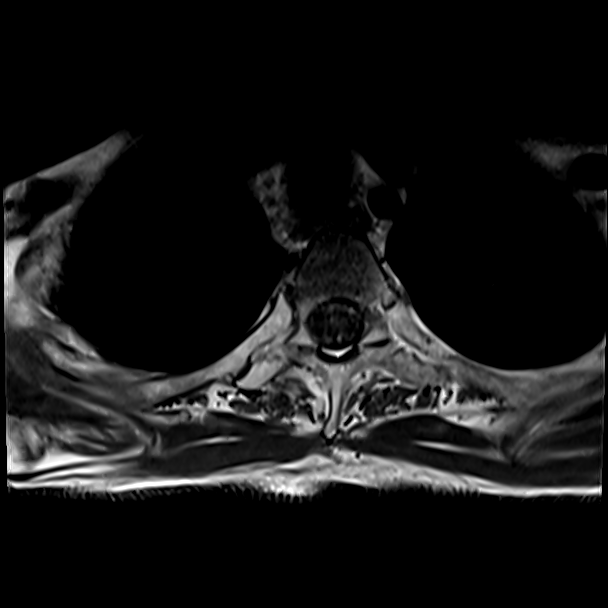
[im 31/37]
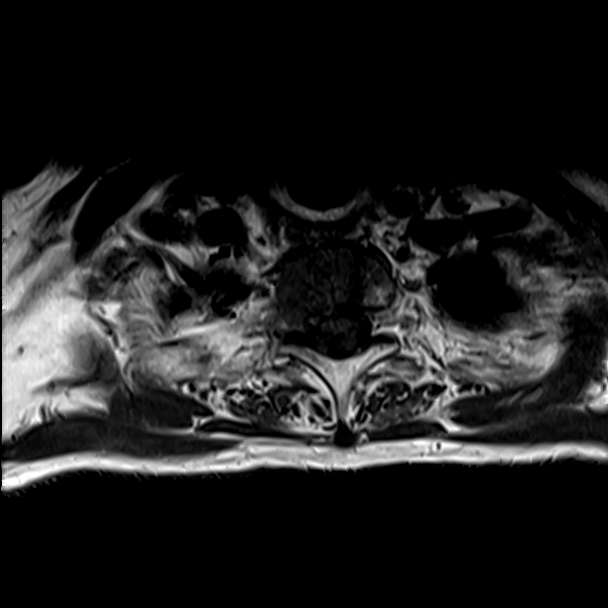
[im 37/37]
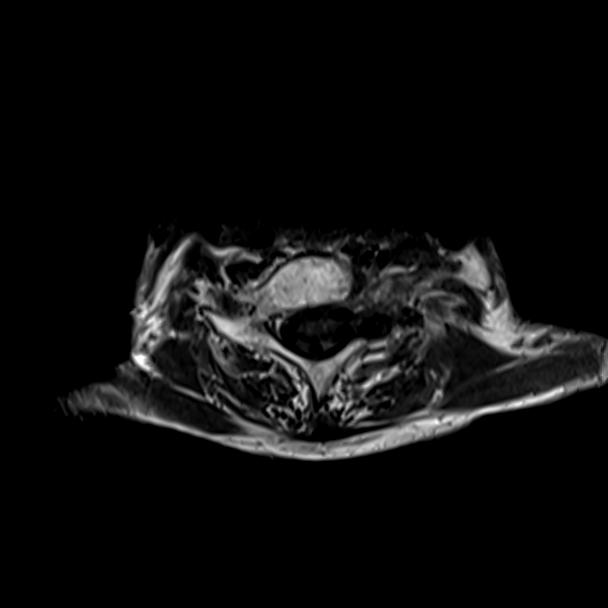

[Series 24: T1 fat-sat post-contrast · sagittal · 3.0mm · 1.06mm/px · 3 of 18 slices shown]
[im 1/18]
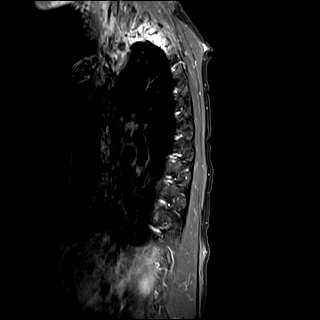
[im 6/18]
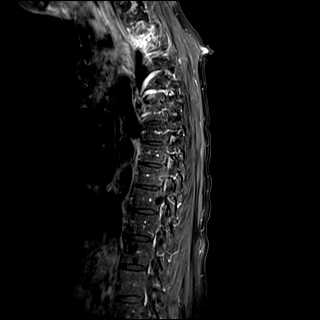
[im 12/18]
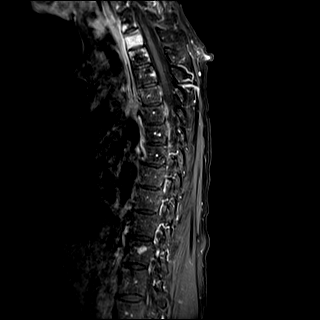

[27 of 48 positions shown; findings below may reference images not displayed]

FINDINGS: Alignment:  Maintained.

Vertebrae: There is abnormal signal and enhancement throughout the
T2 vertebral body extending into the pedicles and right facets. Mild
concavity of the superior endplate of T2 is consistent with
pathologic fracture. A small focus of edema and enhancement is seen
in the anterior, inferior endplate of T1. There is patchy edema and
enhancement in the superior 0.7 cm of T3. Also seen is a T1
hypointense, T2 hyperintense and enhancing lesion in T12 eccentric
to the left measuring 0.9 cm in diameter which is likely due to
metastatic disease.

Depleted marrow at C7, T1 and through the majority of T3 is
consistent with prior radiation therapy.

Cord:  Normal signal throughout.  No epidural tumor is identified.

Paraspinal and other soft tissues: Pleural based lesion left upper
lobe seen on prior CT is difficult to visualize on this exam.

Disc levels:

No bulge or protrusion at any level. The central canal and foramina
are widely patent throughout.
IMPRESSION: Findings consistent with metastatic disease throughout almost the
entirety of T2 with an associated mild superior endplate compression
fracture. Abnormal signal in the anterior, inferior endplate of T1
and superior aspect of T3 is also likely due to metastatic disease.
Small metastatic deposit in T12 is also noted.

Negative for epidural tumor. The central canal and foramina are
widely patent at all levels.

Appearance of the visualized cervical and upper thoracic spine
through T4 consistent with prior radiation therapy.

## 2020-05-10 MED ORDER — GADOBUTROL 1 MMOL/ML IV SOLN
5.0000 mL | Freq: Once | INTRAVENOUS | Status: AC | PRN
  Administered 2020-05-10: 5 mL via INTRAVENOUS

## 2020-05-10 NOTE — Telephone Encounter (Signed)
[name] request patient be scheduled to see her to discuss results.   [name] has contacted Dr.Yarborough and he will connect patient with  Dr.Tom Hopkins at [facility] for evaluation.

## 2020-05-10 NOTE — Telephone Encounter (Signed)
Per Lauren, Patient does not need the xrays on his left arm/shoulder. Left message on Mrs Howards voice mail regarding this

## 2020-05-10 NOTE — Telephone Encounter (Signed)
Mrs Shedden called asking if patient still needs to get xrays of his arm/ shoulder that Lauren ordered since he had MRI of spine today. He reports that his pain has not improved with Oxycodone 5 mg every 4 hours. Please advise   IMPRESSION: Findings consistent with metastatic disease throughout almost the entirety of T2 with an associated mild superior endplate compression fracture. Abnormal signal in the anterior, inferior endplate of T1 and superior aspect of T3 is also likely due to metastatic disease. Small metastatic deposit in T12 is also noted.  Negative for epidural tumor. The central canal and foramina are widely patent at all levels.  Appearance of the visualized cervical and upper thoracic spine through T4 consistent with prior radiation therapy.   Electronically Signed   By: Inge Rise M.D.   On: 05/10/2020 09:27

## 2020-05-10 NOTE — Telephone Encounter (Signed)
FYI...  Per [name]  pt needs to be seen asap        I have him sched to RTC tomorrow. However his wife phone is going str to VM I also have him sched for Lucianne Lei pick up @ 9am see MD @ 10:00 After trying to call pts wife x6 last call was made 4:55 call went straight to VM again. I'll try to contact her again early tomorrow morning before the sched appt times

## 2020-05-11 ENCOUNTER — Encounter: Payer: Self-pay | Admitting: Oncology

## 2020-05-11 ENCOUNTER — Encounter (INDEPENDENT_AMBULATORY_CARE_PROVIDER_SITE_OTHER): Payer: Self-pay

## 2020-05-11 ENCOUNTER — Other Ambulatory Visit: Payer: Self-pay | Admitting: Oncology

## 2020-05-11 ENCOUNTER — Inpatient Hospital Stay: Payer: Medicare Other

## 2020-05-11 ENCOUNTER — Inpatient Hospital Stay (HOSPITAL_BASED_OUTPATIENT_CLINIC_OR_DEPARTMENT_OTHER): Payer: Medicare Other | Admitting: Oncology

## 2020-05-11 ENCOUNTER — Telehealth: Payer: Self-pay

## 2020-05-11 VITALS — BP 173/97 | HR 76 | Temp 97.1°F | Resp 18 | Wt 113.8 lb

## 2020-05-11 DIAGNOSIS — Z9221 Personal history of antineoplastic chemotherapy: Secondary | ICD-10-CM | POA: Insufficient documentation

## 2020-05-11 DIAGNOSIS — R634 Abnormal weight loss: Secondary | ICD-10-CM | POA: Diagnosis not present

## 2020-05-11 DIAGNOSIS — C3412 Malignant neoplasm of upper lobe, left bronchus or lung: Secondary | ICD-10-CM | POA: Diagnosis not present

## 2020-05-11 DIAGNOSIS — Z8546 Personal history of malignant neoplasm of prostate: Secondary | ICD-10-CM | POA: Diagnosis not present

## 2020-05-11 DIAGNOSIS — C109 Malignant neoplasm of oropharynx, unspecified: Secondary | ICD-10-CM | POA: Diagnosis not present

## 2020-05-11 DIAGNOSIS — K0889 Other specified disorders of teeth and supporting structures: Secondary | ICD-10-CM | POA: Insufficient documentation

## 2020-05-11 DIAGNOSIS — F1020 Alcohol dependence, uncomplicated: Secondary | ICD-10-CM | POA: Diagnosis not present

## 2020-05-11 DIAGNOSIS — Z87891 Personal history of nicotine dependence: Secondary | ICD-10-CM | POA: Insufficient documentation

## 2020-05-11 DIAGNOSIS — C7951 Secondary malignant neoplasm of bone: Secondary | ICD-10-CM | POA: Insufficient documentation

## 2020-05-11 LAB — COMPREHENSIVE METABOLIC PANEL
ALT: 14 U/L (ref 0–44)
AST: 20 U/L (ref 15–41)
Albumin: 3.7 g/dL (ref 3.5–5.0)
Alkaline Phosphatase: 68 U/L (ref 38–126)
Anion gap: 13 (ref 5–15)
BUN: 15 mg/dL (ref 8–23)
CO2: 26 mmol/L (ref 22–32)
Calcium: 9.2 mg/dL (ref 8.9–10.3)
Chloride: 101 mmol/L (ref 98–111)
Creatinine, Ser: 0.86 mg/dL (ref 0.61–1.24)
GFR, Estimated: >60 mL/min (ref >60)
Glucose, Bld: 108 mg/dL — ABNORMAL HIGH (ref 70–99)
Potassium: 3.8 mmol/L (ref 3.5–5.1)
Sodium: 140 mmol/L (ref 135–145)
Total Bilirubin: 1 mg/dL (ref 0.3–1.2)
Total Protein: 7.4 g/dL (ref 6.5–8.1)

## 2020-05-11 LAB — CBC WITH DIFFERENTIAL/PLATELET
Abs Immature Granulocytes: 0.02 10*3/uL (ref 0.00–0.07)
Basophils Absolute: 0 10*3/uL (ref 0.0–0.1)
Basophils Relative: 0 %
Eosinophils Absolute: 0 10*3/uL (ref 0.0–0.5)
Eosinophils Relative: 1 %
HCT: 35.3 % — ABNORMAL LOW (ref 39.0–52.0)
Hemoglobin: 11.1 g/dL — ABNORMAL LOW (ref 13.0–17.0)
Immature Granulocytes: 0 %
Lymphocytes Relative: 7 %
Lymphs Abs: 0.6 10*3/uL — ABNORMAL LOW (ref 0.7–4.0)
MCH: 28.9 pg (ref 26.0–34.0)
MCHC: 31.4 g/dL (ref 30.0–36.0)
MCV: 91.9 fL (ref 80.0–100.0)
Monocytes Absolute: 0.8 10*3/uL (ref 0.1–1.0)
Monocytes Relative: 9 %
Neutro Abs: 7.1 10*3/uL (ref 1.7–7.7)
Neutrophils Relative %: 83 %
Platelets: 213 10*3/uL (ref 150–400)
RBC: 3.84 MIL/uL — ABNORMAL LOW (ref 4.22–5.81)
RDW: 12.6 % (ref 11.5–15.5)
WBC: 8.6 10*3/uL (ref 4.0–10.5)
nRBC: 0 % (ref 0.0–0.2)

## 2020-05-11 LAB — MAGNESIUM: Magnesium: 1.8 mg/dL (ref 1.7–2.4)

## 2020-05-11 LAB — URIC ACID: Uric Acid, Serum: 5 mg/dL (ref 3.7–8.6)

## 2020-05-11 MED ORDER — IBUPROFEN 800 MG PO TABS: 800.0000 mg | ORAL_TABLET | Freq: Three times a day (TID) | ORAL | 0 refills | Status: DC | PRN

## 2020-05-11 NOTE — Progress Notes (Signed)
DISCONTINUE ON PATHWAY REGIMEN - Head and Neck     A cycle is every 7 days:     Cisplatin   **Always confirm dose/schedule in your pharmacy ordering system**  REASON: Disease Progression PRIOR TREATMENT: [id number]: Cisplatin 40 mg/m2 q7 Days with Concurrent Radiation TREATMENT RESPONSE: Progressive Disease (PD)  START ON PATHWAY REGIMEN - Head and Neck     A cycle is every 21 days:     Cisplatin      Fluorouracil      Pembrolizumab   **Always confirm dose/schedule in your pharmacy ordering system**  Patient Characteristics: Oropharynx, HPV Negative/Unknown, Metastatic, First Line, No Prior Platinum-Based Chemoradiation within 6 Months, PD-L1 Expression Negative (CPS < 1) / Unknown Disease Classification: Oropharynx HPV Status: Negative (-) Therapeutic Status: Metastatic Disease Line of Therapy: First Line Prior Platinum Status: No Prior Platinum-Based Chemoradiation within 6 Months PD-L1 Expression Status: Awaiting Test Results Intent of Therapy: Non-Curative / Palliative Intent, Discussed with Patient

## 2020-05-11 NOTE — Progress Notes (Signed)
Hematology/Oncology progress note [facility] Telephone:([phone] [phone]   Patient Care Team: Casilda Carls, MD as PCP - General (Internal Medicine) Earlie Server, [name], [name]  HISTORY OF PRESENTING ILLNESS:  Gregory Burton is a  80 y.o.  male with PMH listed below was seen in consultation at the request of  Casilda Carls, MD  for evaluation of lymph node.  Patient moved from [place] to [place] for about 3 weeks. He has noticed a neck knot on the right side of his neck. The knot is sore and makes him to cough and he feels it when he swallows. No swallowing difficulty.  Patient was accompanied by his wife. She reports that patient's previous PCP has done neck sonogram for evaluation.  Patient also had right lower molar tooth extraction done a few weeks before he noticed  He also took a course of antibiotics.  Due to his tooth pain, his appetite has decreased and he has lost weight loss, about 20 pounds, he can not specify the time frame.   Alcoholism History of prostate cancer. S/p Radiation/seed, he follows up with [facility].   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 male who has above history reviewed by me today presents for follow-up of head and neck cancer and lung cancer Patient was accompanied by wife. Status post radiation to thoracic spine mass.  February PET scan was canceled as there is no availability prior to his radiation.  Per wife, patient's pain stopped  for about a week after he finishes palliative radiation to his thoracic spine.  Then he started to have more pain.  He currently patient reports severe left shoulder pain as well as left hand swelling.  Patient has a history of gout and wife is wondering if patient is experiencing a gout attack.  Patient used to take colchicine and prednisone.  Currently not on.  Review of Systems  Constitutional: Negative for appetite change, chills, fatigue, fever and unexpected weight change.  HENT:   Positive for hearing loss. Negative for voice change.   Eyes: Negative for eye problems and icterus.  Respiratory: Negative for chest tightness, cough and shortness of breath.   Cardiovascular: Negative for chest pain and leg swelling.  Gastrointestinal: Negative for abdominal distention, abdominal pain, blood in stool and nausea.  Endocrine: Negative for hot flashes.  Genitourinary: Negative for difficulty urinating, dysuria and frequency.   Musculoskeletal: Positive for arthralgias.  Skin: Negative for itching and rash.  Neurological: Negative for extremity weakness, light-headedness and numbness.  Hematological: Negative for adenopathy. Does not bruise/bleed easily.  Psychiatric/Behavioral: Negative for confusion.       Forgetful    MEDICAL HISTORY:  Past Medical History:  Diagnosis Date  . Alcohol abuse   . Blood transfusion without reported diagnosis 2013  . Cataract   . Dementia ([place])   . Glaucoma   . Gout   . Hypercholesteremia   . Hypertension   . 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 Prostate CA ([place]) 2008  . Squamous cell lung cancer ([place]) 06/23/2019  SURGICAL HISTORY: Past Surgical History:  Procedure Laterality Date  . BRAIN SURGERY  2012  . HERNIA REPAIR    . PORTA CATH INSERTION N/A 07/27/2019   Procedure: PORTA CATH INSERTION;  Surgeon: Katha Cabal, MD;  Location: [facility];  Service: Cardiovascular;  Laterality: N/A;    SOCIAL HISTORY: Social History    Socioeconomic History  . Marital status: Married    Spouse name: Not on file  . Number of children: Not on file  . Years of education: Not on file  . Highest education level: Not on file  Occupational History  . Not on file  Tobacco Use  . Smoking status: Former Smoker    Years: 21.00    Types: Cigarettes    Quit date: 05/11/2008    Years since quitting: 12.0  . Smokeless tobacco: Never Used  Vaping Use  . Vaping Use: Never used  Substance and Sexual Activity  . Alcohol use: Yes    Comment: 0.5 pint liquor a week  . Drug use: Never  . Sexual activity: Not Currently  Other Topics Concern  . Not on file  Social History Narrative   Lives at home with wife. Wife states he has some dementia but is able to sign his own consent.   Social Determinants of Health   Financial Resource Strain: Not on file  Food Insecurity: Not on file  Transportation Needs: Not on file  Physical Activity: Not on file  Stress: Not on file  Social Connections: Not on file  Intimate Partner Violence: Not on file    FAMILY HISTORY: History reviewed. No pertinent family history.  ALLERGIES:  is allergic to enalapril.  MEDICATIONS:  Current Outpatient Medications  Medication Sig Dispense Refill  . atorvastatin (LIPITOR) 20 MG tablet Take 20 mg by mouth daily.    Marland Kitchen donepezil (ARICEPT) 10 MG tablet Take 10 mg by mouth at bedtime.    . dorzolamide-timolol (COSOPT) 22.3-6.8 MG/ML ophthalmic solution Place 1 drop into both eyes 2 (two) times daily.     . folic acid (FOLVITE) 1 MG tablet Take 1 mg by mouth daily.    Marland Kitchen ibuprofen (ADVIL) 800 MG tablet Take 1 tablet (800 mg total) by mouth every 8 (eight) hours as needed for moderate pain. 15 tablet 0  . latanoprost (XALATAN) 0.005 % ophthalmic solution Place 1 drop into both eyes at bedtime.     . lidocaine-prilocaine (EMLA) cream Apply 1 application topically as needed. Apply small amount to port site approx 1-2 hours prior to appointment. 30 g 1  .  Magnesium Chloride (MAG64) 64 MG TBEC TAKE 1 TABLET (64 MG TOTAL) BY MOUTH 2 (TWO) TIMES DAILY. 180 tablet 0  . oxyCODONE (OXY IR/ROXICODONE) 5 MG immediate release tablet Take 1 tablet (5 mg total) by mouth every 4 (four) hours as needed for severe pain. 90 tablet 0  . Thiamine HCl (VITAMIN B-1 PO) Take 100 mg by mouth daily.    . Multiple Vitamins-Minerals (CENTRUM ADULTS PO) Take by mouth. (Patient not taking: Reported on 05/11/2020)     No current facility-administered medications for this visit.   Facility-Administered Medications Ordered in Other Visits  Medication Dose Route Frequency Provider Last Rate Last Admin  . sodium chloride flush (NS) 0.9 % injection 10 mL  10 mL Intravenous PRN Earlie Server, MD   10 mL at 03/01/20 1829     PHYSICAL EXAMINATION: ECOG PERFORMANCE STATUS: 1 - Symptomatic but completely ambulatory Vitals:   05/11/20 1016  BP: (!) 173/97  Pulse: 76  Resp: 18  Temp: (!) 97.1 F (36.2 C)   Filed Weights   05/11/20 1016  Weight: 113 lb 12.8 oz (51.6 kg)    Physical Exam Constitutional:      General: He is not in acute distress. HENT:     Head: Normocephalic and atraumatic.  Eyes:     General: No scleral icterus. Neck:     Comments: Previously right cervical lymphadenopathy no longer palpable Cardiovascular:     Rate and Rhythm: Normal rate and regular rhythm.     Heart sounds: Normal heart sounds.  Pulmonary:     Effort: Pulmonary effort is normal. No respiratory distress.     Breath sounds: No wheezing.  Abdominal:     General: Bowel sounds are normal. There is no distension.     Palpations: Abdomen is soft.  Musculoskeletal:        General: No deformity.     Cervical back: Normal range of motion and neck supple.     Comments: Left shoulder pain, motion limited due to tenderness.  Left metacarpophalangeal joint redness and swelling.  Skin:    General: Skin is warm and dry.     Findings: No erythema or rash.  Neurological:     Mental Status:  He is alert. Mental status is at baseline.     Cranial Nerves: No cranial nerve deficit.     Coordination: Coordination normal.     LABORATORY DATA:  I have reviewed the data as listed Lab Results  Component Value Date   WBC 8.6 05/11/2020   HGB 11.1 (L) 05/11/2020   HCT 35.3 (L) 05/11/2020   MCV 91.9 05/11/2020   PLT 213 05/11/2020   Recent Labs    09/01/19 0751 09/08/19 0832 11/15/19 0818 12/31/19 0927 03/01/20 0802 04/04/20 0908 05/11/20 1130  NA 135 140 141  --  136 140 140  K 3.9 4.2 4.1  --  3.8 4.0 3.8  CL 105 107 109  --  103 106 101  CO2 21* 25 24  --  24 27 26   GLUCOSE 100* 125* 98  --  102* 101* 108*  BUN 19 23 19   --  13 13 15   CREATININE 0.86 0.98 0.88   < > 1.03 0.85 0.86  CALCIUM 8.7* 9.0 9.2  --  9.3 9.1 9.2  GFRNONAA >60 >60 >60  --  >60 >60 >60  GFRAA >60 >60 >60  --   --   --   --   PROT 6.9 6.8 7.0  --  7.1 7.2 7.4  ALBUMIN 3.2* 3.2* 3.9  --  4.0 3.8 3.7  AST 16 16 17   --  19 21 20   ALT 16 13 19   --  20 19 14   ALKPHOS 55 56 41  --  56 63 68  BILITOT 0.7 0.5 1.0  --  1.3* 1.0 1.0   < > = values in this interval not displayed.   Iron/TIBC/Ferritin/ %Sat No results found for: IRON, TIBC, FERRITIN, IRONPCTSAT    RADIOGRAPHIC STUDIES: I have personally reviewed the radiological images as listed and agreed with the findings in the report. MR Thoracic Spine W Wo Contrast  Result Date: 05/10/2020 CLINICAL DATA:  History of head and neck cancer and lung cancer. Right shoulder pain. Lesion in the thoracic spine at T2 on chest CT 03/31/2020. EXAM: MRI THORACIC WITHOUT AND WITH CONTRAST TECHNIQUE: Multiplanar and multiecho pulse sequences of the thoracic spine were obtained without  and with intravenous contrast. CONTRAST:  5 mL GADAVIST IV SOLN COMPARISON:  Chest CT 03/31/2020. FINDINGS: Alignment:  Maintained. Vertebrae: There is abnormal signal and enhancement throughout the T2 vertebral body extending into the pedicles and right facets. Mild concavity of  the superior endplate of T2 is consistent with pathologic fracture. A small focus of edema and enhancement is seen in the anterior, inferior endplate of T1. There is patchy edema and enhancement in the superior 0.7 cm of T3. Also seen is a T1 hypointense, T2 hyperintense and enhancing lesion in T12 eccentric to the left measuring 0.9 cm in diameter which is likely due to metastatic disease. Depleted marrow at C7, T1 and through the majority of T3 is consistent with prior radiation therapy. Cord:  Normal signal throughout.  No epidural tumor is identified. Paraspinal and other soft tissues: Pleural based lesion left upper lobe seen on prior CT is difficult to visualize on this exam. Disc levels: No bulge or protrusion at any level. The central canal and foramina are widely patent throughout. IMPRESSION: Findings consistent with metastatic disease throughout almost the entirety of T2 with an associated mild superior endplate compression fracture. Abnormal signal in the anterior, inferior endplate of T1 and superior aspect of T3 is also likely due to metastatic disease. Small metastatic deposit in T12 is also noted. Negative for epidural tumor. The central canal and foramina are widely patent at all levels. Appearance of the visualized cervical and upper thoracic spine through T4 consistent with prior radiation therapy. Electronically Signed   By: Inge Rise M.D.   On: 05/10/2020 09:27  CT SOFT TISSUE NECK W CONTRAST  Result Date: 03/31/2020 CLINICAL DATA:  Head neck carcinoma. Follow-up response to treatment. EXAM: CT NECK WITH CONTRAST TECHNIQUE: Multidetector CT imaging of the neck was performed using the standard protocol following the bolus administration of intravenous contrast. CONTRAST:  42mL OMNIPAQUE IOHEXOL 300 MG/ML  SOLN COMPARISON:  CT neck 12/31/2019, 05/27/2019 FINDINGS: Pharynx and larynx: Post radiation changes in the pharynx. Previously noted mass in the right piriform sinus has resolved.  Post radiation changes in the larynx without mass. Salivary glands: Hyperenhancement of the parotid and submandibular gland due to radiation change. Thyroid: Negative Lymph nodes: Necrotic right lateral retropharyngeal lymph node unchanged measuring approximately 15 mm in diameter. No new or recurrent lymphadenopathy. Vascular: Right jugular Port-A-Cath. Severe atherosclerotic calcification right carotid bifurcation and right internal carotid artery causing significant stenosis and decreased caliber of the right internal carotid artery above the stenosis. This is unchanged from prior studies. Limited intracranial: Negative Visualized orbits: Negative Mastoids and visualized paranasal sinuses: Paranasal sinuses clear. Skeleton: Destructive lesion T2 vertebral body on the right consistent with metastatic disease. This is not seen on prior studies. No fracture. No tumor in the spinal canal. Upper chest: Chest CT reported separately today. Other: None IMPRESSION: 1. No recurrent tumor right piriform sinus. Necrotic right lateral pharyngeal lymph node is stable. No new or recurrent adenopathy. 2. Lytic bone lesion T2 vertebral body on the right compatible with metastatic disease, not seen on prior studies. Electronically Signed   By: Franchot Gallo M.D.   On: 03/31/2020 13:21   CT Chest W Contrast  Result Date: 03/31/2020 CLINICAL DATA:  Head and neck cancer.  Restaging. EXAM: CT CHEST WITH CONTRAST TECHNIQUE: Multidetector CT imaging of the chest was performed during intravenous contrast administration. CONTRAST:  80mL OMNIPAQUE IOHEXOL 300 MG/ML  SOLN COMPARISON:  12/31/2019 FINDINGS: Cardiovascular: The heart size is normal. No substantial pericardial effusion. Coronary  artery calcification is evident. Atherosclerotic calcification is noted in the wall of the thoracic aorta. Right Port-A-Cath tip is positioned in the right atrium. Mediastinum/Nodes: No mediastinal lymphadenopathy. There is no hilar lymphadenopathy.  The esophagus has normal imaging features. There is no axillary lymphadenopathy. 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 No suspicious pulmonary nodule or mass in the right lung. Posterior left upper lobe pleural base nodule is minimally smaller in the interval measuring 2.1 x 1.8 cm today compared to 2.3 x 2.1 cm previously. Tiny nodule seen just caudal to this dominant lesion is less evident today. No new suspicious nodule or mass in the left lung. Upper Abdomen: Unremarkable Musculoskeletal: 2.7 x 2.4 cm soft tissue lesion destroys the right aspect of the T2 vertebral body and inferior aspect of the posterior right second rib. IMPRESSION: 1. Interval development of a 2.7 cm metastatic lesion destroying the right aspect of the T2 vertebral body and inferior aspect of the posterior right second rib. 2. Minimal interval decrease in size of the posterior left upper lobe pleural based nodule with interval decrease in size of the tiny adjacent satellite nodule. 3. 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 4. Aortic Atherosclerosis (ICD10-I70.0). These results will be called to the ordering clinician or representative by the Radiologist Assistant, and communication documented in the PACS or [employer]. Electronically Signed   By: Misty Stanley M.D.   On: 03/31/2020 12:48   MR Thoracic Spine W Wo Contrast  Result Date: 05/10/2020 CLINICAL DATA:  History of head and neck cancer and lung cancer. Right shoulder pain. Lesion in the thoracic spine at T2 on chest CT 03/31/2020. EXAM: MRI THORACIC WITHOUT AND WITH CONTRAST TECHNIQUE: Multiplanar and multiecho pulse sequences of the thoracic spine were obtained without and with intravenous contrast. CONTRAST:  5 mL GADAVIST IV SOLN COMPARISON:  Chest CT 03/31/2020. FINDINGS: Alignment:  Maintained. Vertebrae: There is abnormal signal and enhancement throughout the T2 vertebral body  extending into the pedicles and right facets. Mild concavity of the superior endplate of T2 is consistent with pathologic fracture. A small focus of edema and enhancement is seen in the anterior, inferior endplate of T1. There is patchy edema and enhancement in the superior 0.7 cm of T3. Also seen is a T1 hypointense, T2 hyperintense and enhancing lesion in T12 eccentric to the left measuring 0.9 cm in diameter which is likely due to metastatic disease. Depleted marrow at C7, T1 and through the majority of T3 is consistent with prior radiation therapy. Cord:  Normal signal throughout.  No epidural tumor is identified. Paraspinal and other soft tissues: Pleural based lesion left upper lobe seen on prior CT is difficult to visualize on this exam. Disc levels: No bulge or protrusion at any level. The central canal and foramina are widely patent throughout. IMPRESSION: Findings consistent with metastatic disease throughout almost the entirety of T2 with an associated mild superior endplate compression fracture. Abnormal signal in the anterior, inferior endplate of T1 and superior aspect of T3 is also likely due to metastatic disease. Small metastatic deposit in T12 is also noted. Negative for epidural tumor. The central canal and foramina are widely patent at all levels. Appearance of the visualized cervical and upper thoracic spine through T4 consistent with prior radiation therapy. Electronically Signed   By: Inge Rise M.D.   On: 05/10/2020 09:27       ASSESSMENT & PLAN:  1. 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[facility])   Cancer Staging 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  [place]) Staging form: Pharynx - P16 Negative Oropharynx, AJCC 8th Edition - Clinical stage from 06/23/2019: Stage IVB (cT4b, cN2b, cM0, p16-) - Signed by Earlie Server, MD on 06/23/2019  Squamous cell lung cancer [facility]) Staging form: Lung, AJCC 8th Edition - Clinical: cT1, cN0, cM0 - Signed by Earlie Server, MD on 07/19/2019   #StageIVB Head and neck cancer-oropharyngeal squamous  cell carcinoma Tongue base mass extending into right piriform sinus [hypopharynx] cT4 cN2b cM0- Stage IVB p16 negative, July 2021 status post chemoradiation #Left upper lobe squamous cell carcinoma, stage I Finished SBRT on 02/07/2020. #Thoracic spine metastatic disease, status post palliative radiation in February.  Symptom clinically got worse. 05/10/2020, MRI thoracic spine was obtained to rule out cord compression. Images showed metastatic disease throughout almost the entire T2 with and associated mild superior endplate compression fracture.  Abnormal signal in the anterior, inferior endplate of T1, superior aspect of the T3.  Small metastatic deposit in T12 is also noted.  I discussed with patient and wife that image findings are consistent with metastatic disease, most likely from stage IVb oropharyngeal squamous cell carcinoma. Discussed about future treatment option of systemic chemotherapy.-Plan cisplatin/5-FU/Keytruda. Send NGS. I also communicated with [facility] neurosurgeon Dr. Cari Caraway who will coordinate care for patient to go to Dr. Valli Glance for kyphoplasty of T2.   #Neoplasm related pain, continue oxycodone 5 mg every 4-6 hours as needed. Check uric acid.-Add Motrin 800 mg every 8 hours as needed for 5 days.Marland Kitchen   #Severe chronic hypomagnesemia, continue slow magnesium 1 tablet twice daily.    #Port-A-Cath in place, recommend port flush every 8 weeks. Per patient's wife's request, I also talked to patient's son Jesse Fall and updated him. All questions were answered. The patient knows to call the clinic with any problems questions or concerns.   Return of visit: To be determined.  Need to coordinate the start of chemotherapy date with potential kyphoplasty date.Earlie Server, MD, PhD Hematology Oncology [facility] at [facility] Pager- [phone] 05/11/2020

## 2020-05-11 NOTE — Telephone Encounter (Signed)
Mrs. Ingraham was made aware and understands [name] recommendation:  Pick up a prescription of Motrin 800mg  take every 8 hours for 5 days. Uric acid is not high so it is not gout.  Motrin can still help his pain to reduce inflammation.  He may continue oxycodone.

## 2020-05-11 NOTE — Progress Notes (Signed)
Patient here to discuss test results.  Reports worsening left should/arm/hand pain, 11/10 pain scale.  His left had is swollen as well.  Was prescribed Oxycodone over the weekend and this is not helping with pain.

## 2020-05-15 ENCOUNTER — Telehealth: Payer: Self-pay | Admitting: *Deleted

## 2020-05-15 ENCOUNTER — Telehealth: Payer: Self-pay

## 2020-05-15 NOTE — Telephone Encounter (Signed)
Mrs Sox called reporting that patient pain has improved, I also spoke with patient who confirms this and said that he can actually move his fingers some also. Mrs Petteway reported that Dr Georgiann Cocker office called today and he will follow up with him after the PET Scan is done

## 2020-05-15 NOTE — Telephone Encounter (Signed)
Per [name]:  Please send omniseq on specimen [id number] -head and neck cancer   Order request form filled out and faxed to pathology.

## 2020-05-16 ENCOUNTER — Other Ambulatory Visit: Payer: Self-pay | Admitting: Oncology

## 2020-05-19 ENCOUNTER — Telehealth: Payer: Self-pay

## 2020-05-19 NOTE — Telephone Encounter (Signed)
Per [name], [facility] neuro has reached out to patient but pt/ pt's son have decided to wait until pt has PET and case is discussed on tumor board before proceeding with neuro appt.

## 2020-05-22 ENCOUNTER — Other Ambulatory Visit: Payer: Self-pay

## 2020-05-22 ENCOUNTER — Ambulatory Visit
Admission: RE | Admit: 2020-05-22 | Discharge: 2020-05-22 | Disposition: A | Discharge status: Home / Self Care | Payer: Medicare Other | Source: Ambulatory Visit | Attending: Oncology | Admitting: Oncology

## 2020-05-22 DIAGNOSIS — I700 Atherosclerosis of aorta: Secondary | ICD-10-CM | POA: Insufficient documentation

## 2020-05-22 DIAGNOSIS — C109 Malignant neoplasm of oropharynx, unspecified: Secondary | ICD-10-CM | POA: Diagnosis present

## 2020-05-22 DIAGNOSIS — I2510 Atherosclerotic heart disease of native coronary artery without angina pectoris: Secondary | ICD-10-CM | POA: Diagnosis not present

## 2020-05-22 DIAGNOSIS — C7951 Secondary malignant neoplasm of bone: Secondary | ICD-10-CM | POA: Insufficient documentation

## 2020-05-22 DIAGNOSIS — C3492 Malignant neoplasm of unspecified part of left bronchus or lung: Secondary | ICD-10-CM | POA: Insufficient documentation

## 2020-05-22 DIAGNOSIS — K5730 Diverticulosis of large intestine without perforation or abscess without bleeding: Secondary | ICD-10-CM | POA: Diagnosis not present

## 2020-05-22 LAB — GLUCOSE, CAPILLARY: Glucose-Capillary: 63 mg/dL — ABNORMAL LOW (ref 70–99)

## 2020-05-22 IMAGING — CT NM PET TUM IMG RESTAG (PS) SKULL BASE T - THIGH
9 series · 25 of 25 positions shown · non-contrast
Comparison: [DATE] MRI thoracic spine. [DATE] CT neck and
chest. [DATE] PET-CT.

CLINICAL DATA: Subsequent treatment strategy for metastatic
oropharynx cancer with new thoracic vertebral metastases. History of
biopsy-proven left upper lobe squamous cell lung carcinoma.

EXAM:
NUCLEAR MEDICINE PET SKULL BASE TO THIGH
TECHNIQUE: 6.4 mCi F-18 FDG was injected intravenously. Full-ring PET imaging
was performed from the skull base to thigh after the radiotracer. CT
data was obtained and used for attenuation correction and anatomic
localization.
Fasting blood glucose: 63 mg/dl

[Series 3: ct wb 5.0 b30f · axial · 5.0mm · 0.98mm/px · z∈[-240,+627]mm · 3 of 290 slices shown]
[im 1/290]
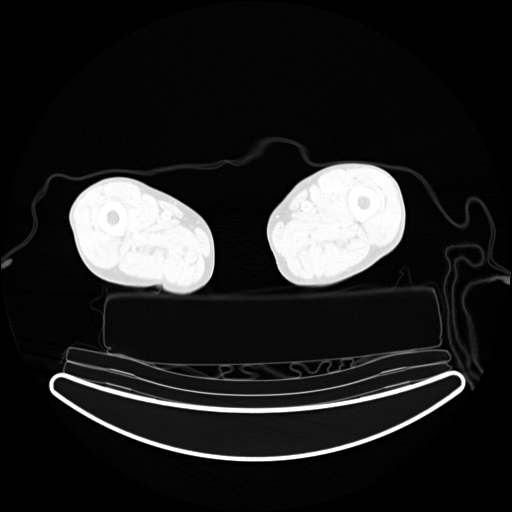
[im 145/290]
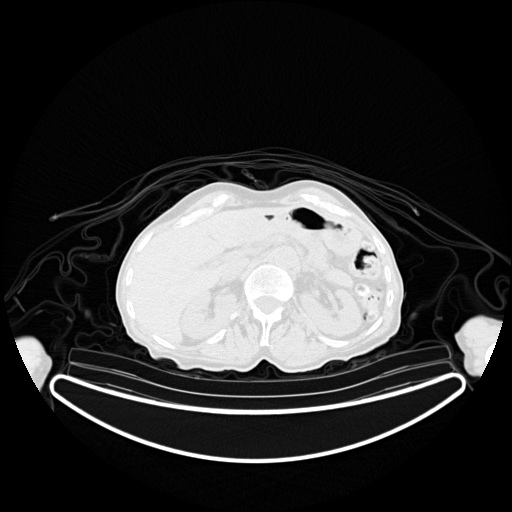
[im 290/290  brain]
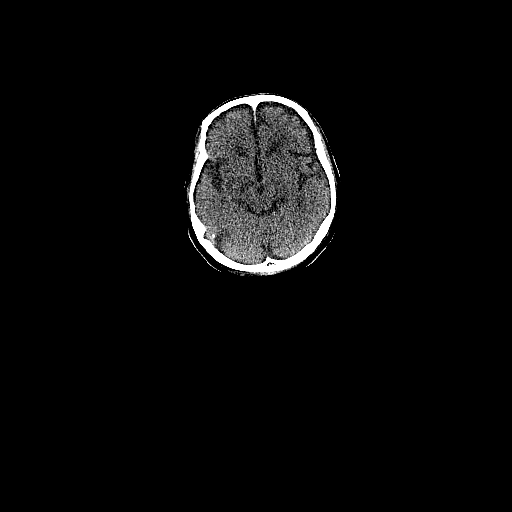

[Series 5: pet wb uncorrected (nac) · axial · 5.0mm · 4.07mm/px · z∈[-240,+627]mm · 4 of 290 slices shown]
[im 1/290]
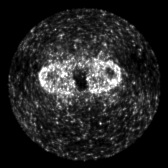
[im 97/290]
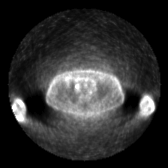
[im 193/290]
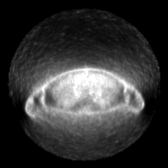
[im 290/290]
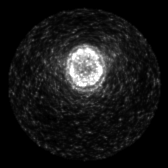

[Series 6: pet wb (ac) · axial · 5.0mm · 2.72mm/px · z∈[-240,+627]mm · 4 of 290 slices shown]
[im 1/290]
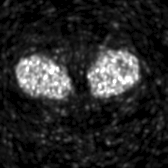
[im 97/290]
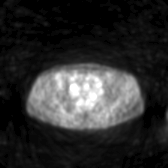
[im 193/290]
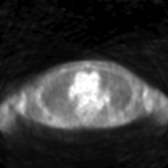
[im 290/290]
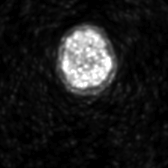

[Series 603: pet fused axial · 4 of 290 slices shown]
[im 1/290]
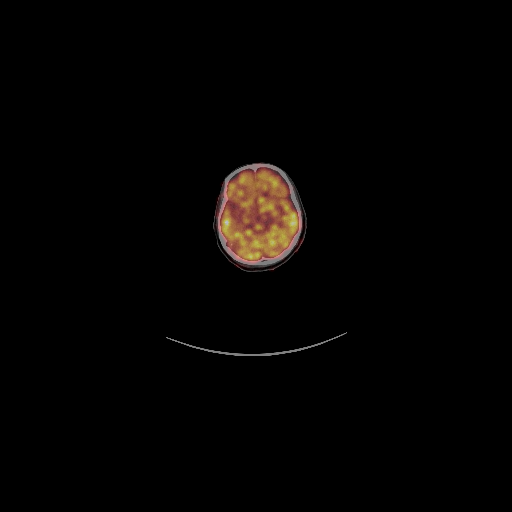
[im 97/290]
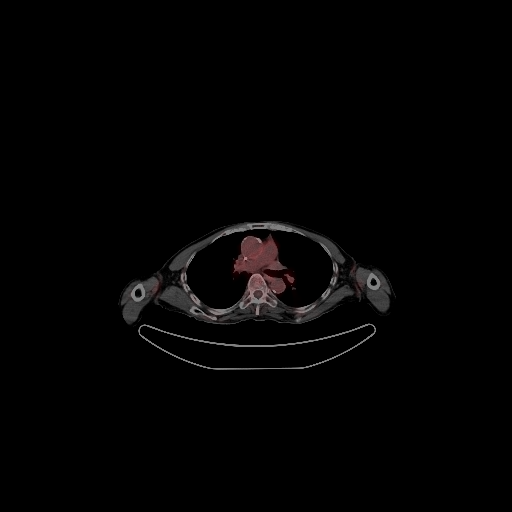
[im 193/290]
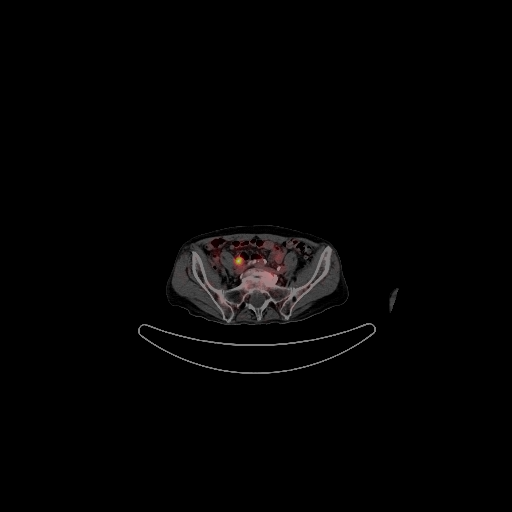
[im 290/290]
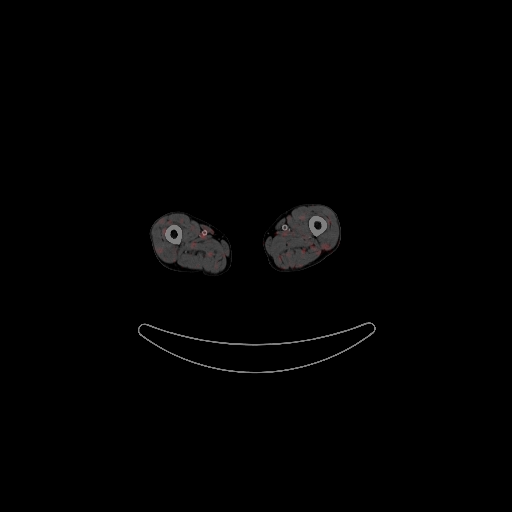

[Series 604: pet fused coronal · 1 of 78 slices shown]
[im 1/78]
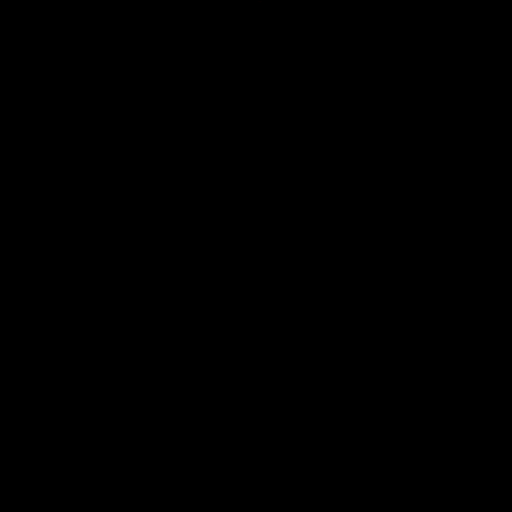

[Series 605: pet fused sagittal · 2 of 151 slices shown]
[im 1/151]
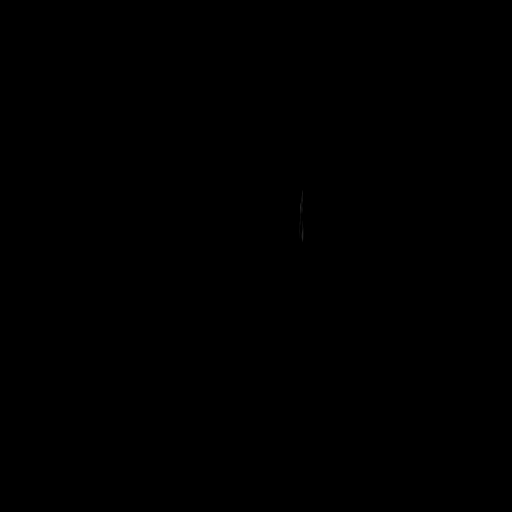
[im 151/151]
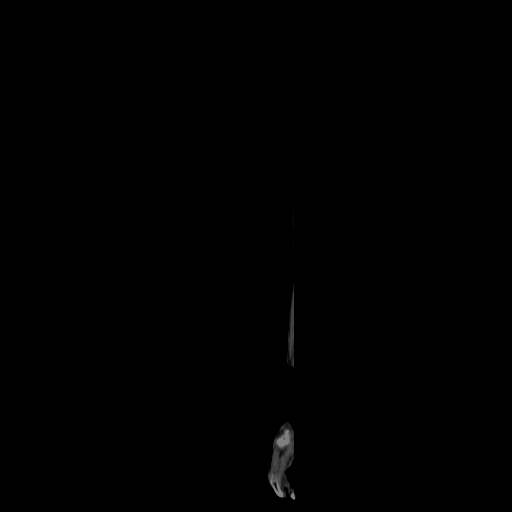

[Series 606: pet axial · axial · 5.0mm · 5.19mm/px · z∈[-225,+628]mm · 4 of 285 slices shown]
[im 1/285]
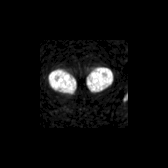
[im 95/285]
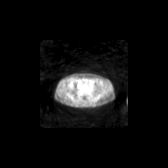
[im 190/285]
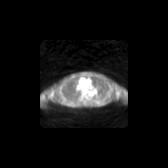
[im 285/285]
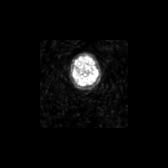

[Series 607: pet coronal · coronal · 5.0mm · 5.19mm/px · 1 of 106 slices shown]
[im 1/106]
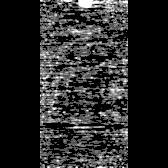

[Series 608: pet sagittal · sagittal · 5.0mm · 5.19mm/px · 2 of 152 slices shown]
[im 1/152]
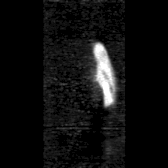
[im 152/152]
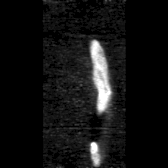

[25 of 25 positions shown; findings below may reference images not displayed]

FINDINGS: Mediastinal blood pool activity: SUV max

Liver activity: SUV max NA

NECK:

Persistent enlarged hypermetabolic 1.4 cm short axis diameter right
lateral retropharyngeal centrally necrotic node with max SUV
(series 3/image 20), not substantially changed from [DATE] neck
CT.

Hypermetabolic 0.8 cm right level 3 neck lymph node with max SUV
(series 3/image 48), not appreciably changed in size since
[DATE] neck CT, decreased from 1.9 cm with max SUV 5.6 on
[DATE] PET-CT.

No evidence of residual or recurrent hypermetabolism at the site of
the primary tumor in the right piriform sinus. No hypermetabolic
left neck lymph nodes.

Incidental CT findings: Right internal jugular Port-A-Cath
terminates at the cavoatrial junction.

CHEST:

Peripheral left upper lobe 1.8 x 1.5 cm pulmonary nodule
demonstrates low level hypermetabolism with max SUV 2.7 (series
3/image 89), mildly decreased from 2.1 x 1.7 cm on [DATE] chest
CT. New patchy bandlike consolidation in the adjacent superior
segment left lower lobe compatible with early postradiation change.

No enlarged or hypermetabolic axillary, mediastinal or hilar lymph
nodes.

Incidental CT findings: Coronary atherosclerosis. Atherosclerotic
nonaneurysmal thoracic aorta.

ABDOMEN/PELVIS: No abnormal hypermetabolic activity within the
liver, pancreas, adrenal glands, or spleen. No hypermetabolic lymph
nodes in the abdomen or pelvis.

Incidental CT findings: Brachytherapy seeds in the prostate. Marked
diffuse colonic diverticulosis. Atherosclerotic nonaneurysmal
abdominal aorta.

SKELETON: New hypermetabolic lytic mildly expansile 2.6 cm left
scapula lesion near the glenoid with max SUV 12.3. New
hypermetabolic lytic T2 vertebral lesion with max SUV 4.5.

Incidental CT findings: none
IMPRESSION: 1. New hypermetabolic lytic bone metastases in the left scapula near
the glenoid and in the T2 vertebral body.
2. Persistent hypermetabolic right lateral retropharyngeal and right
level 3 neck lymph node metastases.
3. Peripheral left upper lobe pulmonary nodule is mildly decreased
since [DATE] chest CT with new surrounding mild bandlike
postradiation changes, compatible with treatment response. Low level
hypermetabolism within this nodule is nonspecific. Continued chest
CT or PET-CT follow-up advised.
4. No residual or recurrent hypermetabolism at the site of the
primary tumor in the right para form sinus.
5. Chronic findings include: Aortic Atherosclerosis ([O2]-[O2]).
Coronary atherosclerosis. Marked colonic diverticulosis.

## 2020-05-22 MED ORDER — FLUDEOXYGLUCOSE F - 18 (FDG) INJECTION
5.9000 | Freq: Once | INTRAVENOUS | Status: AC | PRN
  Administered 2020-05-22: 6.42 via INTRAVENOUS

## 2020-05-25 ENCOUNTER — Encounter: Payer: Self-pay | Admitting: Radiation Oncology

## 2020-05-25 ENCOUNTER — Ambulatory Visit
Admission: RE | Admit: 2020-05-25 | Discharge: 2020-05-25 | Disposition: A | Discharge status: Home / Self Care | Payer: Medicare Other | Source: Ambulatory Visit | Attending: Radiation Oncology | Admitting: Radiation Oncology

## 2020-05-25 ENCOUNTER — Other Ambulatory Visit: Payer: Medicare Other

## 2020-05-25 ENCOUNTER — Other Ambulatory Visit: Payer: Self-pay

## 2020-05-25 VITALS — BP 146/78 | HR 53 | Temp 95.8°F | Resp 16 | Wt 113.3 lb

## 2020-05-25 DIAGNOSIS — Z923 Personal history of irradiation: Secondary | ICD-10-CM | POA: Diagnosis not present

## 2020-05-25 DIAGNOSIS — C7951 Secondary malignant neoplasm of bone: Secondary | ICD-10-CM | POA: Insufficient documentation

## 2020-05-25 DIAGNOSIS — R918 Other nonspecific abnormal finding of lung field: Secondary | ICD-10-CM | POA: Insufficient documentation

## 2020-05-25 DIAGNOSIS — C01 Malignant neoplasm of base of tongue: Secondary | ICD-10-CM | POA: Insufficient documentation

## 2020-05-25 NOTE — Progress Notes (Signed)
Tumor Board Documentation  Ladanian Kelter was presented by Dr Tasia Catchings at our Tumor Board on 05/25/2020, which included representatives from radiation oncology,internal medicine,navigation,pathology,medical oncology,radiology,surgical,genetics,research,palliative care,pulmonology.  Gregory Burton currently presents as a current patient,for discussion with history of the following treatments: surgical intervention(s),active survellience,neoadjuvant chemoradiation.  Additionally, we reviewed previous medical and familial history, history of present illness, and recent lab results along with all available histopathologic and imaging studies. The tumor board considered available treatment options and made the following recommendations: Palliative radiation therapy,Chemotherapy,Surgery (Kyphoplasty)    The following procedures/referrals were also placed: No orders of the defined types were placed in this encounter.   Clinical Trial Status: not discussed   Staging used: AJCC Stage Group  AJCC Staging: T: 4 N: 2b M: 0 Group: Stage IVb Carcinoma of Tongue Base, Squamous Cell Lung Caner   National site-specific guidelines NCCN were discussed with respect to the case.  Tumor board is a meeting of clinicians from various specialty areas who evaluate and discuss patients for whom a multidisciplinary approach is being considered. Final determinations in the plan of care are those of the provider(s). The responsibility for follow up of recommendations given during tumor board is that of the provider.   Today's extended care, comprehensive team conference, [place] was not present for the discussion and was not examined.   Multidisciplinary Tumor Board is a multidisciplinary case peer review process.  Decisions discussed in the Multidisciplinary Tumor Board reflect the opinions of the specialists present at the conference without having examined the patient.  Ultimately, treatment and diagnostic decisions rest  with the primary provider(s) and the patient.

## 2020-05-25 NOTE — Progress Notes (Signed)
Radiation Oncology Follow up Note old patient new area left scapula  Name: Gregory Burton   Date:   05/25/2020 MRN:  [id number] DOB: 04/05/1940    This 80 y.o. 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  He continues to have persistent pain in his left shoulder.  He is 1 month out from palliative radiation therapy to his upper thoracic spine.  He is seeking evaluation for possible kyphoplasty that area since it is in jeopardy of collapse based on the extensive lytic destruction of the vertebral body.  He has limited range of motion his left shoulder without eliciting pain.  Recent PET CT scan showed lytic and hypermetabolic activity in this region..  COMPLICATIONS OF TREATMENT: none  FOLLOW UP COMPLIANCE: keeps appointments   PHYSICAL EXAM:  BP (!) 146/78 (BP Location: Left Arm, Patient Position: Sitting)   Pulse (!) 53   Temp (!) 95.8 F (35.4 C) (Tympanic)   Resp 16   Wt 113 lb 4.8 oz (51.4 kg)   BMI 17.75 kg/m  Range of motion of the left upper extremity is limited secondary to pain.  Well-developed well-nourished patient in NAD. HEENT reveals PERLA, EOMI, discs not visualized.  Oral cavity is clear. No oral mucosal lesions are identified. Neck is clear without evidence of cervical or supraclavicular adenopathy. Lungs are clear to A&P. Cardiac examination is essentially unremarkable with regular rate and rhythm without murmur rub or thrill. Abdomen is benign with no organomegaly or masses noted. Motor sensory and DTR levels are equal and symmetric in the upper and lower  extremities. Cranial nerves II through XII are grossly intact. Proprioception is intact. No peripheral adenopathy or edema is identified. No motor or sensory levels are noted. Crude visual fields are within normal range.  RADIOLOGY RESULTS: PET CT scan reviewed  PLAN: This time elected ahead with a short hypofractionated course of radiation therapy to his left scapula.  Would plan on delivering 20 Gray in 5 fractions.  Risks and benefits of treatment occluding fatigue skin reaction were reviewed with the patient and his wife.  We are also trying to contact medical oncology to arrange evaluation for possible kyphoplasty to his upper thoracic spine.  Patient is wife both comprehend my recommendations well.  I would like to take this opportunity to thank you for allowing me to participate in the care of your patient.Noreene Filbert, MD

## 2020-05-26 ENCOUNTER — Inpatient Hospital Stay (HOSPITAL_BASED_OUTPATIENT_CLINIC_OR_DEPARTMENT_OTHER): Payer: Medicare Other | Admitting: Oncology

## 2020-05-26 ENCOUNTER — Encounter: Payer: Self-pay | Admitting: Oncology

## 2020-05-26 ENCOUNTER — Inpatient Hospital Stay: Payer: Medicare Other

## 2020-05-26 ENCOUNTER — Ambulatory Visit
Admission: RE | Admit: 2020-05-26 | Discharge: 2020-05-26 | Disposition: A | Discharge status: Home / Self Care | Payer: Medicare Other | Source: Ambulatory Visit | Attending: Radiation Oncology | Admitting: Radiation Oncology

## 2020-05-26 VITALS — BP 138/76 | HR 64 | Temp 97.5°F | Wt 112.7 lb

## 2020-05-26 DIAGNOSIS — G893 Neoplasm related pain (acute) (chronic): Secondary | ICD-10-CM | POA: Insufficient documentation

## 2020-05-26 DIAGNOSIS — C109 Malignant neoplasm of oropharynx, unspecified: Secondary | ICD-10-CM | POA: Insufficient documentation

## 2020-05-26 DIAGNOSIS — C139 Malignant neoplasm of hypopharynx, unspecified: Secondary | ICD-10-CM | POA: Diagnosis present

## 2020-05-26 DIAGNOSIS — C3492 Malignant neoplasm of unspecified part of left bronchus or lung: Secondary | ICD-10-CM

## 2020-05-26 DIAGNOSIS — C01 Malignant neoplasm of base of tongue: Secondary | ICD-10-CM | POA: Insufficient documentation

## 2020-05-26 DIAGNOSIS — C3412 Malignant neoplasm of upper lobe, left bronchus or lung: Secondary | ICD-10-CM | POA: Insufficient documentation

## 2020-05-26 DIAGNOSIS — C7951 Secondary malignant neoplasm of bone: Secondary | ICD-10-CM | POA: Insufficient documentation

## 2020-05-26 DIAGNOSIS — Z7189 Other specified counseling: Secondary | ICD-10-CM

## 2020-05-26 DIAGNOSIS — C770 Secondary and unspecified malignant neoplasm of lymph nodes of head, face and neck: Secondary | ICD-10-CM | POA: Insufficient documentation

## 2020-05-26 NOTE — Telephone Encounter (Signed)
Result scanned in chart.

## 2020-05-26 NOTE — Progress Notes (Signed)
Patient left arm swelling has improved but still having left arm pain and stiffness.  Also mentions that he seems to be worrying about things more lately.

## 2020-05-26 NOTE — Progress Notes (Signed)
Hematology/Oncology progress note [facility] [phone] [phone]   Patient Care Team: Casilda Carls, MD as PCP - General (Internal Medicine) Earlie Server, [name], [name]  HISTORY OF PRESENTING ILLNESS:  Gregory Burton is a  80 y.o.  male with PMH listed below was seen in consultation at the request of  Casilda Carls, MD  for evaluation of lymph node.  Patient moved from [place] to [place] for about 3 weeks. He has noticed a neck knot on the right side of his neck. The knot is sore and makes him to cough and he feels it when he swallows. No swallowing difficulty.  Patient was accompanied by his wife. She reports that patient's previous PCP has done neck sonogram for evaluation.  Patient also had right lower molar tooth extraction done a few weeks before he noticed  He also took a course of antibiotics.  Due to his tooth pain, his appetite has decreased and he has lost weight loss, about 20 pounds, he can not specify the time frame.   Alcoholism History of prostate cancer. S/p Radiation/seed, he follows up with [facility].   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 Images showed metastatic disease throughout almost the entire T2 with and associated mild superior endplate compression fracture.  Abnormal signal in the anterior, inferior endplate of T1, superior aspect of the T3.  Small metastatic deposit in T12 is also noted.   INTERVAL  HISTORY Gregory Burton is a 80 y.o. male who has above history reviewed by me today presents for follow-up of head and neck cancer and lung cancer Patient was accompanied by wife. Pain of left shoulder and swelling of his hand 1 improved after taking NSAIDs and oxycodone.   05/22/2020, PET scan showed new hypermetabolic lytic bone metastasis in the left scapular near the glenoid and in the T2 vertebral body.  Persistent hypermetabolic right lateral retropharyngeal and right level 3 neck lymph node metastasis.  Peripheral left upper lobe pulmonary nodule is mildly decreased with new surrounding mild platelike postradiation changes..  No residual recurrent neoplasm at the site of vomiting in the right piriform sinus  Patient presents to discuss management plan.  Accompanied by wife.  Patient is a poor historian.  Review of Systems  Constitutional: Negative for appetite change, chills, fatigue, fever and unexpected weight change.  HENT:   Positive for hearing loss. Negative for voice change.   Eyes: Negative for eye problems and icterus.  Respiratory: Negative for chest tightness, cough and shortness of breath.   Cardiovascular: Negative for chest pain and leg swelling.  Gastrointestinal: Negative for abdominal distention, abdominal pain, blood in stool and nausea.  Endocrine: Negative for hot flashes.  Genitourinary: Negative for difficulty urinating, dysuria and frequency.   Musculoskeletal: Positive for arthralgias.  Skin: Negative for itching and rash.  Neurological: Negative for extremity weakness, light-headedness and numbness.  Hematological: Negative for adenopathy. Does not bruise/bleed easily.  Psychiatric/Behavioral: Negative for confusion.       Forgetful    MEDICAL HISTORY:  Past Medical History:  Diagnosis Date  . Alcohol abuse   . Blood transfusion without reported diagnosis 2013  . Cataract   . Dementia ([place])   . Glaucoma   . Gout   . Hypercholesteremia   .  Hypertension   . 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 Prostate CA ([place]) 2008  . Squamous cell lung cancer ([place]) 06/23/2019    SURGICAL HISTORY: Past Surgical History:  Procedure Laterality Date  . BRAIN SURGERY  2012  . HERNIA REPAIR    . PORTA CATH INSERTION N/A 07/27/2019   Procedure: PORTA CATH INSERTION;  Surgeon: Katha Cabal, MD;  Location: [facility];  Service: Cardiovascular;  Laterality: N/A;    SOCIAL HISTORY: Social History   Socioeconomic History  . Marital status: Married    Spouse name: Not on file  . Number of children: Not on file  . Years of education: Not on file  . Highest education level: Not on file  Occupational History  . Not on file  Tobacco Use  . Smoking status: Former Smoker    Years: 21.00    Types: Cigarettes    Quit date: 05/11/2008    Years since quitting: 12.0  . Smokeless tobacco: Never Used  Vaping Use  . Vaping Use: Never used  Substance and Sexual Activity  . Alcohol use: Yes    Comment: 0.5 pint liquor a week  . Drug use: Never  . Sexual activity: Not Currently  Other Topics Concern  . Not on file  Social History Narrative   Lives at home with wife. Wife states he has some dementia but is able to sign his own consent.   Social Determinants of Health   Financial Resource Strain: Not on file  Food Insecurity: Not on file  Transportation Needs: Not on file  Physical Activity: Not on file  Stress: Not on file  Social Connections: Not on file  Intimate Partner Violence: Not on file    FAMILY HISTORY: History reviewed. No pertinent family history.  ALLERGIES:  is allergic to enalapril.  MEDICATIONS:  Current Outpatient Medications  Medication Sig Dispense Refill  . atorvastatin (LIPITOR) 20 MG tablet Take 20 mg by mouth daily.    Marland Kitchen donepezil (ARICEPT) 10 MG tablet Take 10 mg by mouth at bedtime.    . dorzolamide-timolol (COSOPT) 22.3-6.8 MG/ML ophthalmic solution Place 1 drop into both eyes 2 (two) times daily.      . folic acid (FOLVITE) 1 MG tablet Take 1 mg by mouth daily.    Marland Kitchen ibuprofen (ADVIL) 800 MG tablet Take 1 tablet (800 mg total) by mouth every 8 (eight) hours as needed for moderate pain. 15 tablet 0  . latanoprost (XALATAN) 0.005 % ophthalmic solution Place 1 drop into both eyes at bedtime.     . lidocaine-prilocaine (EMLA) cream Apply 1 application topically as needed. Apply small amount to port site approx 1-2 hours prior to appointment. 30 g 1  . Magnesium Chloride (MAG64) 64 MG TBEC TAKE 1 TABLET (64 MG TOTAL) BY MOUTH 2 (TWO) TIMES DAILY. 180 tablet 0  . oxyCODONE (OXY IR/ROXICODONE) 5 MG immediate release tablet Take 1 tablet (5 mg total) by mouth every 4 (four) hours as needed for severe pain. 90 tablet 0  . Thiamine HCl (VITAMIN B-1 PO) Take 100 mg by mouth daily.    . Multiple Vitamins-Minerals (CENTRUM ADULTS PO) Take by mouth. (Patient not taking: No sig reported)     No current facility-administered medications for  this visit.   Facility-Administered Medications Ordered in Other Visits  Medication Dose Route Frequency Provider Last Rate Last Admin  . sodium chloride flush (NS) 0.9 % injection 10 mL  10 mL Intravenous PRN Earlie Server, MD   10 mL at 03/01/20 7169     PHYSICAL EXAMINATION: ECOG PERFORMANCE STATUS: 1 - Symptomatic but completely ambulatory Vitals:   05/26/20 1003  BP: 138/76  Pulse: 64  Temp: (!) 97.5 F (36.4 C)  SpO2: 98%   Filed Weights   05/26/20 1003  Weight: 112 lb 11.2 oz (51.1 kg)    Physical Exam Constitutional:      General: He is not in acute distress. HENT:     Head: Normocephalic and atraumatic.  Eyes:     General: No scleral icterus. Neck:     Comments: Previously right cervical lymphadenopathy no longer palpable Cardiovascular:     Rate and Rhythm: Normal rate and regular rhythm.     Heart sounds: Normal heart sounds.  Pulmonary:     Effort: Pulmonary effort is normal. No respiratory distress.     Breath sounds: No wheezing.   Abdominal:     General: Bowel sounds are normal. There is no distension.     Palpations: Abdomen is soft.  Musculoskeletal:        General: No deformity.     Cervical back: Normal range of motion and neck supple.     Comments: Left shoulder pain, motion limited due to tenderness.   Skin:    General: Skin is warm and dry.     Findings: No erythema or rash.  Neurological:     Mental Status: He is alert. Mental status is at baseline.     Cranial Nerves: No cranial nerve deficit.     Coordination: Coordination normal.     LABORATORY DATA:  I have reviewed the data as listed Lab Results  Component Value Date   WBC 8.6 05/11/2020   HGB 11.1 (L) 05/11/2020   HCT 35.3 (L) 05/11/2020   MCV 91.9 05/11/2020   PLT 213 05/11/2020   Recent Labs    09/01/19 0751 09/08/19 0832 11/15/19 0818 12/31/19 0927 03/01/20 0802 04/04/20 0908 05/11/20 1130  NA 135 140 141  --  136 140 140  K 3.9 4.2 4.1  --  3.8 4.0 3.8  CL 105 107 109  --  103 106 101  CO2 21* 25 24  --  24 27 26   GLUCOSE 100* 125* 98  --  102* 101* 108*  BUN 19 23 19   --  13 13 15   CREATININE 0.86 0.98 0.88   < > 1.03 0.85 0.86  CALCIUM 8.7* 9.0 9.2  --  9.3 9.1 9.2  GFRNONAA >60 >60 >60  --  >60 >60 >60  GFRAA >60 >60 >60  --   --   --   --   PROT 6.9 6.8 7.0  --  7.1 7.2 7.4  ALBUMIN 3.2* 3.2* 3.9  --  4.0 3.8 3.7  AST 16 16 17   --  19 21 20   ALT 16 13 19   --  20 19 14   ALKPHOS 55 56 41  --  56 63 68  BILITOT 0.7 0.5 1.0  --  1.3* 1.0 1.0   < > = values in this interval not displayed.   Iron/TIBC/Ferritin/ %Sat No results found for: IRON, TIBC, FERRITIN, IRONPCTSAT    RADIOGRAPHIC STUDIES: I have personally reviewed the radiological images as listed and agreed with the  findings in the report. MR Thoracic Spine W Wo Contrast  Result Date: 05/10/2020 CLINICAL DATA:  History of head and neck cancer and lung cancer. Right shoulder pain. Lesion in the thoracic spine at T2 on chest CT 03/31/2020. EXAM: MRI THORACIC  WITHOUT AND WITH CONTRAST TECHNIQUE: Multiplanar and multiecho pulse sequences of the thoracic spine were obtained without and with intravenous contrast. CONTRAST:  5 mL GADAVIST IV SOLN COMPARISON:  Chest CT 03/31/2020. FINDINGS: Alignment:  Maintained. Vertebrae: There is abnormal signal and enhancement throughout the T2 vertebral body extending into the pedicles and right facets. Mild concavity of the superior endplate of T2 is consistent with pathologic fracture. A small focus of edema and enhancement is seen in the anterior, inferior endplate of T1. There is patchy edema and enhancement in the superior 0.7 cm of T3. Also seen is a T1 hypointense, T2 hyperintense and enhancing lesion in T12 eccentric to the left measuring 0.9 cm in diameter which is likely due to metastatic disease. Depleted marrow at C7, T1 and through the majority of T3 is consistent with prior radiation therapy. Cord:  Normal signal throughout.  No epidural tumor is identified. Paraspinal and other soft tissues: Pleural based lesion left upper lobe seen on prior CT is difficult to visualize on this exam. Disc levels: No bulge or protrusion at any level. The central canal and foramina are widely patent throughout. IMPRESSION: Findings consistent with metastatic disease throughout almost the entirety of T2 with an associated mild superior endplate compression fracture. Abnormal signal in the anterior, inferior endplate of T1 and superior aspect of T3 is also likely due to metastatic disease. Small metastatic deposit in T12 is also noted. Negative for epidural tumor. The central canal and foramina are widely patent at all levels. Appearance of the visualized cervical and upper thoracic spine through T4 consistent with prior radiation therapy. Electronically Signed   By: Inge Rise M.D.   On: 05/10/2020 09:27   NM PET Image Restage (PS) Skull Base to Thigh  Result Date: 05/23/2020 CLINICAL DATA:  Subsequent treatment strategy for  metastatic 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 biopsy-proven left upper lobe squamous cell lung carcinoma. EXAM: NUCLEAR MEDICINE PET SKULL BASE TO THIGH TECHNIQUE: 6.4 mCi F-18 FDG was injected intravenously. Full-ring PET imaging was performed from the skull base to thigh after the radiotracer. CT data was obtained and used for attenuation correction and anatomic localization. Fasting blood glucose: 63 mg/dl COMPARISON:  05/10/2020 MRI thoracic spine. 03/31/2020 CT neck and chest. 06/07/2019 PET-CT. FINDINGS: Mediastinal blood pool activity: SUV max 2.0 Liver activity: SUV max NA NECK: Persistent enlarged hypermetabolic 1.4 cm short axis diameter right lateral retropharyngeal centrally necrotic node with max SUV 3.5 (series 3/image 20), not substantially changed from 03/31/2020 neck CT. Hypermetabolic 0.8 cm right level 3 neck lymph node with max SUV 4.7 (series 3/image 48), not appreciably changed in size since 03/31/2020 neck CT, decreased from 1.9 cm with max SUV 5.6 on 06/07/2019 PET-CT. No evidence of residual or recurrent hypermetabolism at the site of the primary tumor in the right piriform sinus. No hypermetabolic left neck lymph nodes. Incidental CT findings: Right internal jugular Port-A-Cath terminates at the cavoatrial junction. CHEST: Peripheral left upper lobe 1.8 x 1.5 cm pulmonary nodule demonstrates low level hypermetabolism with max SUV 2.7 (series 3/image 89), mildly decreased from 2.1 x 1.7 cm on 03/31/2020 chest CT. New patchy bandlike consolidation in the adjacent superior segment left lower lobe compatible with early postradiation change. No  enlarged or hypermetabolic axillary, mediastinal or hilar lymph nodes. Incidental CT findings: Coronary atherosclerosis. Atherosclerotic nonaneurysmal thoracic aorta. ABDOMEN/PELVIS: No abnormal hypermetabolic activity within the liver, pancreas, adrenal glands, or spleen. No hypermetabolic lymph nodes in the abdomen or  pelvis. Incidental CT findings: Brachytherapy seeds in the prostate. Marked diffuse colonic diverticulosis. Atherosclerotic nonaneurysmal abdominal aorta. SKELETON: New hypermetabolic lytic mildly expansile 2.6 cm left scapula lesion near the glenoid with max SUV 12.3. New hypermetabolic lytic T2 vertebral lesion with max SUV 4.5. Incidental CT findings: none IMPRESSION: 1. New hypermetabolic lytic bone metastases in the left scapula near the glenoid and in the T2 vertebral body. 2. Persistent hypermetabolic right lateral retropharyngeal and right level 3 neck lymph node metastases. 3. Peripheral left upper lobe pulmonary nodule is mildly decreased since 03/31/2020 chest CT with new surrounding mild bandlike postradiation changes, compatible with treatment response. Low level hypermetabolism within this nodule is nonspecific. Continued chest CT or PET-CT follow-up advised. 4. No residual or recurrent hypermetabolism at the site of the primary tumor in the right para form sinus. 5. Chronic findings include: Aortic Atherosclerosis (ICD10-I70.0). Coronary atherosclerosis. Marked colonic diverticulosis. Electronically Signed   By: Ilona Sorrel M.D.   On: 05/23/2020 09:27  CT SOFT TISSUE NECK W CONTRAST  Result Date: 03/31/2020 CLINICAL DATA:  Head neck carcinoma. Follow-up response to treatment. EXAM: CT NECK WITH CONTRAST TECHNIQUE: Multidetector CT imaging of the neck was performed using the standard protocol following the bolus administration of intravenous contrast. CONTRAST:  52mL OMNIPAQUE IOHEXOL 300 MG/ML  SOLN COMPARISON:  CT neck 12/31/2019, 05/27/2019 FINDINGS: Pharynx and larynx: Post radiation changes in the pharynx. Previously noted mass in the right piriform sinus has resolved. Post radiation changes in the larynx without mass. Salivary glands: Hyperenhancement of the parotid and submandibular gland due to radiation change. Thyroid: Negative Lymph nodes: Necrotic right lateral retropharyngeal lymph node  unchanged measuring approximately 15 mm in diameter. No new or recurrent lymphadenopathy. Vascular: Right jugular Port-A-Cath. Severe atherosclerotic calcification right carotid bifurcation and right internal carotid artery causing significant stenosis and decreased caliber of the right internal carotid artery above the stenosis. This is unchanged from prior studies. Limited intracranial: Negative Visualized orbits: Negative Mastoids and visualized paranasal sinuses: Paranasal sinuses clear. Skeleton: Destructive lesion T2 vertebral body on the right consistent with metastatic disease. This is not seen on prior studies. No fracture. No tumor in the spinal canal. Upper chest: Chest CT reported separately today. Other: None IMPRESSION: 1. No recurrent tumor right piriform sinus. Necrotic right lateral pharyngeal lymph node is stable. No new or recurrent adenopathy. 2. Lytic bone lesion T2 vertebral body on the right compatible with metastatic disease, not seen on prior studies. Electronically Signed   By: Franchot Gallo M.D.   On: 03/31/2020 13:21   CT Chest W Contrast  Result Date: 03/31/2020 CLINICAL DATA:  Head and neck cancer.  Restaging. EXAM: CT CHEST WITH CONTRAST TECHNIQUE: Multidetector CT imaging of the chest was performed during intravenous contrast administration. CONTRAST:  53mL OMNIPAQUE IOHEXOL 300 MG/ML  SOLN COMPARISON:  12/31/2019 FINDINGS: Cardiovascular: The heart size is normal. No substantial pericardial effusion. Coronary artery calcification is evident. Atherosclerotic calcification is noted in the wall of the thoracic aorta. Right Port-A-Cath tip is positioned in the right atrium. Mediastinum/Nodes: No mediastinal lymphadenopathy. There is no hilar lymphadenopathy. The esophagus has normal imaging features. There is no axillary lymphadenopathy. 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 No suspicious pulmonary nodule or mass in the  right  lung. Posterior left upper lobe pleural base nodule is minimally smaller in the interval measuring 2.1 x 1.8 cm today compared to 2.3 x 2.1 cm previously. Tiny nodule seen just caudal to this dominant lesion is less evident today. No new suspicious nodule or mass in the left lung. Upper Abdomen: Unremarkable Musculoskeletal: 2.7 x 2.4 cm soft tissue lesion destroys the right aspect of the T2 vertebral body and inferior aspect of the posterior right second rib. IMPRESSION: 1. Interval development of a 2.7 cm metastatic lesion destroying the right aspect of the T2 vertebral body and inferior aspect of the posterior right second rib. 2. Minimal interval decrease in size of the posterior left upper lobe pleural based nodule with interval decrease in size of the tiny adjacent satellite nodule. 3. 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 4. Aortic Atherosclerosis (ICD10-I70.0). These results will be called to the ordering clinician or representative by the Radiologist Assistant, and communication documented in the PACS or [employer]. Electronically Signed   By: Misty Stanley M.D.   On: 03/31/2020 12:48   MR Thoracic Spine W Wo Contrast  Result Date: 05/10/2020 CLINICAL DATA:  History of head and neck cancer and lung cancer. Right shoulder pain. Lesion in the thoracic spine at T2 on chest CT 03/31/2020. EXAM: MRI THORACIC WITHOUT AND WITH CONTRAST TECHNIQUE: Multiplanar and multiecho pulse sequences of the thoracic spine were obtained without and with intravenous contrast. CONTRAST:  5 mL GADAVIST IV SOLN COMPARISON:  Chest CT 03/31/2020. FINDINGS: Alignment:  Maintained. Vertebrae: There is abnormal signal and enhancement throughout the T2 vertebral body extending into the pedicles and right facets. Mild concavity of the superior endplate of T2 is consistent with pathologic fracture. A small focus of edema and enhancement is seen in the anterior, inferior endplate of T1. There  is patchy edema and enhancement in the superior 0.7 cm of T3. Also seen is a T1 hypointense, T2 hyperintense and enhancing lesion in T12 eccentric to the left measuring 0.9 cm in diameter which is likely due to metastatic disease. Depleted marrow at C7, T1 and through the majority of T3 is consistent with prior radiation therapy. Cord:  Normal signal throughout.  No epidural tumor is identified. Paraspinal and other soft tissues: Pleural based lesion left upper lobe seen on prior CT is difficult to visualize on this exam. Disc levels: No bulge or protrusion at any level. The central canal and foramina are widely patent throughout. IMPRESSION: Findings consistent with metastatic disease throughout almost the entirety of T2 with an associated mild superior endplate compression fracture. Abnormal signal in the anterior, inferior endplate of T1 and superior aspect of T3 is also likely due to metastatic disease. Small metastatic deposit in T12 is also noted. Negative for epidural tumor. The central canal and foramina are widely patent at all levels. Appearance of the visualized cervical and upper thoracic spine through T4 consistent with prior radiation therapy. Electronically Signed   By: Inge Rise M.D.   On: 05/10/2020 09:27   NM PET Image Restage (PS) Skull Base to Thigh  Result Date: 05/23/2020 CLINICAL DATA:  Subsequent treatment strategy for metastatic 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 biopsy-proven left upper lobe squamous cell lung carcinoma. EXAM: NUCLEAR MEDICINE PET SKULL BASE TO THIGH TECHNIQUE: 6.4 mCi F-18 FDG was injected intravenously. Full-ring PET imaging was performed from the skull base to thigh after the radiotracer. CT data was obtained and used for attenuation correction and anatomic localization. Fasting blood glucose:  63 mg/dl COMPARISON:  05/10/2020 MRI thoracic spine. 03/31/2020 CT neck and chest. 06/07/2019 PET-CT. FINDINGS: Mediastinal blood pool  activity: SUV max 2.0 Liver activity: SUV max NA NECK: Persistent enlarged hypermetabolic 1.4 cm short axis diameter right lateral retropharyngeal centrally necrotic node with max SUV 3.5 (series 3/image 20), not substantially changed from 03/31/2020 neck CT. Hypermetabolic 0.8 cm right level 3 neck lymph node with max SUV 4.7 (series 3/image 48), not appreciably changed in size since 03/31/2020 neck CT, decreased from 1.9 cm with max SUV 5.6 on 06/07/2019 PET-CT. No evidence of residual or recurrent hypermetabolism at the site of the primary tumor in the right piriform sinus. No hypermetabolic left neck lymph nodes. Incidental CT findings: Right internal jugular Port-A-Cath terminates at the cavoatrial junction. CHEST: Peripheral left upper lobe 1.8 x 1.5 cm pulmonary nodule demonstrates low level hypermetabolism with max SUV 2.7 (series 3/image 89), mildly decreased from 2.1 x 1.7 cm on 03/31/2020 chest CT. New patchy bandlike consolidation in the adjacent superior segment left lower lobe compatible with early postradiation change. No enlarged or hypermetabolic axillary, mediastinal or hilar lymph nodes. Incidental CT findings: Coronary atherosclerosis. Atherosclerotic nonaneurysmal thoracic aorta. ABDOMEN/PELVIS: No abnormal hypermetabolic activity within the liver, pancreas, adrenal glands, or spleen. No hypermetabolic lymph nodes in the abdomen or pelvis. Incidental CT findings: Brachytherapy seeds in the prostate. Marked diffuse colonic diverticulosis. Atherosclerotic nonaneurysmal abdominal aorta. SKELETON: New hypermetabolic lytic mildly expansile 2.6 cm left scapula lesion near the glenoid with max SUV 12.3. New hypermetabolic lytic T2 vertebral lesion with max SUV 4.5. Incidental CT findings: none IMPRESSION: 1. New hypermetabolic lytic bone metastases in the left scapula near the glenoid and in the T2 vertebral body. 2. Persistent hypermetabolic right lateral retropharyngeal and right level 3 neck lymph  node metastases. 3. Peripheral left upper lobe pulmonary nodule is mildly decreased since 03/31/2020 chest CT with new surrounding mild bandlike postradiation changes, compatible with treatment response. Low level hypermetabolism within this nodule is nonspecific. Continued chest CT or PET-CT follow-up advised. 4. No residual or recurrent hypermetabolism at the site of the primary tumor in the right para form sinus. 5. Chronic findings include: Aortic Atherosclerosis (ICD10-I70.0). Coronary atherosclerosis. Marked colonic diverticulosis. Electronically Signed   By: Ilona Sorrel M.D.   On: 05/23/2020 09:27       ASSESSMENT & PLAN:  1. Squamous cell carcinoma of left lung ([place])   2. Hypomagnesemia   3. Neoplasm related pain   4. 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 Bone metastasis ([place])   6. 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 Case was also discussed on multidisciplinary tumor board on 05/25/2020 Discussed with patient, wife, also called patient's son Jesse Fall about options of mixed that treatments. Clinically, patient had a metastatic head and neck cancer. T 2 is largely replaced by tumor and I communicated with [facility] neurosurgery, patient can benefit from kyphoplasty. Patient has also seen radiation  oncology for palliative radiation of theLeft scapular. After he  finishes local therapy, I recommend systemic chemotherapy.  Patient and family members agree with proceeding with kyphoplasty with [facility]'s neurosurgery, palliative radiation of the left scapular.  They would like to have another discussion when patient finishes these procedures and ready for chemotherapy. Patient and family understand that patient's condition is not curable and treatment is with palliative intent.    #Neoplasm related pain, continue oxycodone 5 mg every 4-6 hours as needed.  He may also utilize NSAIDs 1-2 times daily as needed.  #Left upper lobe squamous cell carcinoma, stage I Finished SBRT on 02/07/2020.  Possible chemotherapy regimen, consider cisplatin/5-FU/Keytruda. NGS.  Showed CPS 20%  #Severe chronic hypomagnesemia, continue slow magnesium 1 tablet twice daily.    #Port-A-Cath in place, recommend port flush every 8 weeks. All questions were answered. The patient knows to call the clinic with any problems questions or concerns.   Return of visit: To be determined.  ` Earlie Server, MD, PhD Hematology Oncology [facility] at [facility] Pager- [phone] 05/26/2020

## 2020-05-30 DIAGNOSIS — C01 Malignant neoplasm of base of tongue: Secondary | ICD-10-CM | POA: Diagnosis not present

## 2020-06-05 ENCOUNTER — Telehealth: Payer: Self-pay

## 2020-06-05 ENCOUNTER — Inpatient Hospital Stay: Payer: Medicare Other

## 2020-06-05 ENCOUNTER — Ambulatory Visit
Admission: RE | Admit: 2020-06-05 | Discharge: 2020-06-05 | Disposition: A | Discharge status: Home / Self Care | Payer: Medicare Other | Source: Ambulatory Visit | Attending: Radiation Oncology | Admitting: Radiation Oncology

## 2020-06-05 ENCOUNTER — Encounter: Payer: Self-pay | Admitting: Oncology

## 2020-06-05 DIAGNOSIS — C01 Malignant neoplasm of base of tongue: Secondary | ICD-10-CM | POA: Diagnosis not present

## 2020-06-05 NOTE — Telephone Encounter (Addendum)
Pt's wife showed up today with concerns regarding pt following up with [facility] neurosurgery  doctor.  She reports that they were suppose to postpone radiation since pt was going to undergo a Kyphoplasly. She has called neurosurgery office multiple times to try to find out when surgery can be done, but has not heard back. Since they have not heard from his office, pt is still doing radiation and will have final tx on 4/15.  Mrs. Houp would like for [name] to reach out to Neurosurgery doctor ("Dr. Luan Pulling") to see when his surgery can be done, since she is unable to reach him.

## 2020-06-06 ENCOUNTER — Encounter: Payer: Self-pay | Admitting: Oncology

## 2020-06-06 ENCOUNTER — Ambulatory Visit
Admission: RE | Admit: 2020-06-06 | Discharge: 2020-06-06 | Disposition: A | Discharge status: Home / Self Care | Payer: Medicare Other | Source: Ambulatory Visit | Attending: Radiation Oncology | Admitting: Radiation Oncology

## 2020-06-06 ENCOUNTER — Inpatient Hospital Stay: Payer: Medicare Other

## 2020-06-06 DIAGNOSIS — C01 Malignant neoplasm of base of tongue: Secondary | ICD-10-CM | POA: Diagnosis not present

## 2020-06-06 NOTE — Telephone Encounter (Signed)
Please contact Dr.Tom Georgiann Cocker 's office at [facility].

## 2020-06-07 ENCOUNTER — Ambulatory Visit
Admission: RE | Admit: 2020-06-07 | Discharge: 2020-06-07 | Disposition: A | Discharge status: Home / Self Care | Payer: Medicare Other | Source: Ambulatory Visit | Attending: Radiation Oncology | Admitting: Radiation Oncology

## 2020-06-07 ENCOUNTER — Other Ambulatory Visit: Payer: Self-pay

## 2020-06-07 ENCOUNTER — Inpatient Hospital Stay: Payer: Medicare Other

## 2020-06-07 ENCOUNTER — Telehealth: Payer: Self-pay

## 2020-06-07 DIAGNOSIS — C7951 Secondary malignant neoplasm of bone: Secondary | ICD-10-CM

## 2020-06-07 DIAGNOSIS — C01 Malignant neoplasm of base of tongue: Secondary | ICD-10-CM | POA: Diagnosis not present

## 2020-06-07 DIAGNOSIS — G893 Neoplasm related pain (acute) (chronic): Secondary | ICD-10-CM

## 2020-06-07 NOTE — Telephone Encounter (Signed)
Nutrition Follow-up:  Patient identified on Malnutrition Screening report for weight loss and poor appetite.   RD last on 11/15/19.  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  Patient undergoing radiation at this time.  Noted referral to [facility] neurosurgery for possible kyphoplasty.    Spoke with wife and patient via phone.  Patient reports no appetite.  Wife asking if he can take megace again.  Patient has taken in the past and feels like it has helped.  Breakfast he has been eating eggs with applesauce, toast with butter or cheese and juice and ensure.  Lunch wife is going to fix hot dog and fruit for patient.  Dinner tonight is going to be fish, brussel sprouts and rice and gravy.  Wife preparing soft foods due to poor dentition.      Medications: reviewed  Labs: reviewed  Anthropometrics:   Weight 112 lb 11.2 oz on 4/1  118 lb on 04/04/20 5% weight loss in the last 2 months,, concerning    NUTRITION DIAGNOSIS: Inadequate oral intake continues    INTERVENTION:  Wife asking MD can send in refill for megace.  RD will send in basket message to MD and team Reviewed ways to add calories and protein with patient and wife.  Discussed soft, moist foods for ease of swallowing Complimentary case of ensure plus will be left at front desk for patient to pick up tomorrow.       MONITORING, EVALUATION, GOAL: weight trends, intake   NEXT VISIT: as needed  Xzaria Teo B. Zenia Resides, [place], [place] Registered Dietitian [phone] (mobile)

## 2020-06-07 NOTE — Telephone Encounter (Signed)
Spoke to [place] [phone]), Scheduler for Dr. Georgiann Cocker, to follow up on appt for this pt to discuss kyphoplasty and he states that he has talked to pt's wife, Delcie Roch, mulitple times. He said he would send message to Dr. Georgiann Cocker regarding this.

## 2020-06-08 ENCOUNTER — Other Ambulatory Visit: Payer: Self-pay | Admitting: Oncology

## 2020-06-08 ENCOUNTER — Ambulatory Visit
Admission: RE | Admit: 2020-06-08 | Discharge: 2020-06-08 | Disposition: A | Discharge status: Home / Self Care | Payer: Medicare Other | Source: Ambulatory Visit | Attending: Radiation Oncology | Admitting: Radiation Oncology

## 2020-06-08 ENCOUNTER — Telehealth: Payer: Self-pay

## 2020-06-08 ENCOUNTER — Inpatient Hospital Stay: Payer: Medicare Other

## 2020-06-08 DIAGNOSIS — C01 Malignant neoplasm of base of tongue: Secondary | ICD-10-CM | POA: Diagnosis not present

## 2020-06-08 MED ORDER — MEGESTROL ACETATE 40 MG PO TABS: 40.0000 mg | ORAL_TABLET | Freq: Two times a day (BID) | ORAL | 0 refills | Status: DC

## 2020-06-08 NOTE — Telephone Encounter (Signed)
-----   Message from Earlie Server, MD sent at 06/08/2020 12:36 PM EDT ----- Yes.  Rx sent ----- Message ----- From: Evelina Dun, RN Sent: 06/08/2020  11:43 AM EDT To: Earlie Server, MD  [name] please advise.   ----- Message ----- From: Jennet Maduro, [place] Sent: 06/07/2020   3:46 PM EDT To: Evelina Dun, RN, Earlie Server, MD  Dr Tasia Catchings,  I spoke with Mr Pilger and he states that he does not have an appetite.  Wife asking if he could take the megace again. He has taken it in the past and they felt it helped him.  They do not have any of the megace at this time.    [place]

## 2020-06-08 NOTE — Telephone Encounter (Signed)
Patient's wife, Delcie Roch, notified that we have reached out to Dr. Georgiann Cocker office regarding her wanting and appt to discuss kyphoplasty for Mr. Gregory Burton. She will be on the lookout for an appt call.

## 2020-06-08 NOTE — Telephone Encounter (Signed)
Pt's wife, Delcie Roch, notified that Megace was sent pharmacy. She voiced understanding.

## 2020-06-09 ENCOUNTER — Ambulatory Visit
Admission: RE | Admit: 2020-06-09 | Discharge: 2020-06-09 | Disposition: A | Discharge status: Home / Self Care | Payer: Medicare Other | Source: Ambulatory Visit | Attending: Radiation Oncology | Admitting: Radiation Oncology

## 2020-06-09 ENCOUNTER — Inpatient Hospital Stay: Payer: Medicare Other

## 2020-06-09 DIAGNOSIS — C01 Malignant neoplasm of base of tongue: Secondary | ICD-10-CM | POA: Diagnosis not present

## 2020-06-13 ENCOUNTER — Other Ambulatory Visit: Payer: Self-pay | Admitting: Oncology

## 2020-06-15 ENCOUNTER — Other Ambulatory Visit: Payer: Self-pay | Admitting: Oncology

## 2020-06-16 ENCOUNTER — Other Ambulatory Visit: Payer: Self-pay

## 2020-06-16 ENCOUNTER — Telehealth: Payer: Self-pay

## 2020-06-16 ENCOUNTER — Other Ambulatory Visit: Payer: Self-pay | Admitting: Oncology

## 2020-06-16 DIAGNOSIS — C109 Malignant neoplasm of oropharynx, unspecified: Secondary | ICD-10-CM

## 2020-06-16 DIAGNOSIS — C3492 Malignant neoplasm of unspecified part of left bronchus or lung: Secondary | ICD-10-CM

## 2020-06-16 NOTE — Telephone Encounter (Signed)
Ct scan soft tissue neck ordered and scheduled per [name]. Patient's wife, Delcie Roch, aware of appt details and Lucianne Lei pickup. Next available CT date is 5/5, [name] ok with date.

## 2020-06-22 ENCOUNTER — Other Ambulatory Visit: Payer: Medicare Other

## 2020-06-23 NOTE — Progress Notes (Signed)
Tumor Board Documentation  Gregory Burton was presented by Dr Tasia Catchings at our Tumor Board on 06/22/2020, which included representatives from medical oncology,radiation oncology,surgical oncology,internal medicine,navigation,pathology,radiology,surgical,genetics,research,palliative care,pulmonology.  Gregory Burton currently presents as a current patient,for MDC,for progression with history of the following treatments: neoadjuvant chemoradiation,active survellience.  Additionally, we reviewed previous medical and familial history, history of present illness, and recent lab results along with all available histopathologic and imaging studies. The tumor board considered available treatment options and made the following recommendations: Additional screening (MRI Brain) Observe vocal cord lesion. Supportive Care, Kyphoplasty planned  The following procedures/referrals were also placed: No orders of the defined types were placed in this encounter.   Clinical Trial Status: not discussed   Staging used: AJCC Stage Group  AJCC Staging:       Group: Stage IV Squamous Cell Carcinoma of Lung Mets to C Spine   National site-specific guidelines NCCN were discussed with respect to the case.  Tumor board is a meeting of clinicians from various specialty areas who evaluate and discuss patients for whom a multidisciplinary approach is being considered. Final determinations in the plan of care are those of the provider(s). The responsibility for follow up of recommendations given during tumor board is that of the provider.   Today's extended care, comprehensive team conference, Gregory Burton was not present for the discussion and was not examined.   Multidisciplinary Tumor Board is a multidisciplinary case peer review process.  Decisions discussed in the Multidisciplinary Tumor Board reflect the opinions of the specialists present at the conference without having examined the patient.  Ultimately, treatment and  diagnostic decisions rest with the primary provider(s) and the patient.

## 2020-06-27 ENCOUNTER — Inpatient Hospital Stay: Payer: Medicare Other | Attending: Oncology

## 2020-06-27 ENCOUNTER — Other Ambulatory Visit: Payer: Self-pay

## 2020-06-27 ENCOUNTER — Inpatient Hospital Stay: Payer: Medicare Other

## 2020-06-27 DIAGNOSIS — R634 Abnormal weight loss: Secondary | ICD-10-CM | POA: Insufficient documentation

## 2020-06-27 DIAGNOSIS — C01 Malignant neoplasm of base of tongue: Secondary | ICD-10-CM | POA: Insufficient documentation

## 2020-06-27 DIAGNOSIS — C7951 Secondary malignant neoplasm of bone: Secondary | ICD-10-CM | POA: Diagnosis not present

## 2020-06-27 DIAGNOSIS — K219 Gastro-esophageal reflux disease without esophagitis: Secondary | ICD-10-CM | POA: Insufficient documentation

## 2020-06-27 DIAGNOSIS — Z95828 Presence of other vascular implants and grafts: Secondary | ICD-10-CM

## 2020-06-27 DIAGNOSIS — C3492 Malignant neoplasm of unspecified part of left bronchus or lung: Secondary | ICD-10-CM | POA: Diagnosis present

## 2020-06-27 DIAGNOSIS — Z452 Encounter for adjustment and management of vascular access device: Secondary | ICD-10-CM | POA: Insufficient documentation

## 2020-06-27 DIAGNOSIS — F1097 Alcohol use, unspecified with alcohol-induced persisting dementia: Secondary | ICD-10-CM | POA: Insufficient documentation

## 2020-06-27 DIAGNOSIS — G893 Neoplasm related pain (acute) (chronic): Secondary | ICD-10-CM | POA: Diagnosis not present

## 2020-06-27 DIAGNOSIS — Z87891 Personal history of nicotine dependence: Secondary | ICD-10-CM | POA: Insufficient documentation

## 2020-06-27 MED ORDER — HEPARIN SOD (PORK) LOCK FLUSH 100 UNIT/ML IV SOLN
500.0000 [IU] | Freq: Once | INTRAVENOUS | Status: AC
  Administered 2020-06-27: 500 [IU] via INTRAVENOUS
  Filled 2020-06-27: qty 5

## 2020-06-27 MED ORDER — HEPARIN SOD (PORK) LOCK FLUSH 100 UNIT/ML IV SOLN
INTRAVENOUS | Status: AC
  Filled 2020-06-27: qty 5

## 2020-06-27 MED ORDER — SODIUM CHLORIDE 0.9% FLUSH
10.0000 mL | INTRAVENOUS | Status: DC | PRN
  Administered 2020-06-27: 10 mL via INTRAVENOUS
  Filled 2020-06-27: qty 10

## 2020-06-29 ENCOUNTER — Ambulatory Visit
Admission: RE | Admit: 2020-06-29 | Discharge: 2020-06-29 | Disposition: A | Discharge status: Home / Self Care | Payer: Medicare Other | Source: Ambulatory Visit | Attending: Oncology | Admitting: Oncology

## 2020-06-29 ENCOUNTER — Other Ambulatory Visit: Payer: Self-pay

## 2020-06-29 DIAGNOSIS — C109 Malignant neoplasm of oropharynx, unspecified: Secondary | ICD-10-CM | POA: Diagnosis present

## 2020-06-29 LAB — POCT I-STAT CREATININE: Creatinine, Ser: 1 mg/dL (ref 0.61–1.24)

## 2020-06-29 IMAGING — CT CT NECK W/ CM
4 of 5 series · 13 of 33 positions shown, 15 images · IV contrast (omnipaque)
Comparison: CT neck [DATE]

CLINICAL DATA: Current head neck cancer. Restaging oro pharyngeal
carcinoma postop chemo and radiation. Worsening hoarseness.

EXAM:
CT NECK WITH CONTRAST
TECHNIQUE: Multidetector CT imaging of the neck was performed using the
standard protocol following the bolus administration of intravenous
contrast.
CONTRAST:  75mL OMNIPAQUE IOHEXOL 300 MG/ML  SOLN

[Series 2: axial neck neck (person_name) 2.00 · axial · 0.54mm/px · z∈[-740,-678]mm · 2 of 126 slices shown]
[im 32/126  bone]
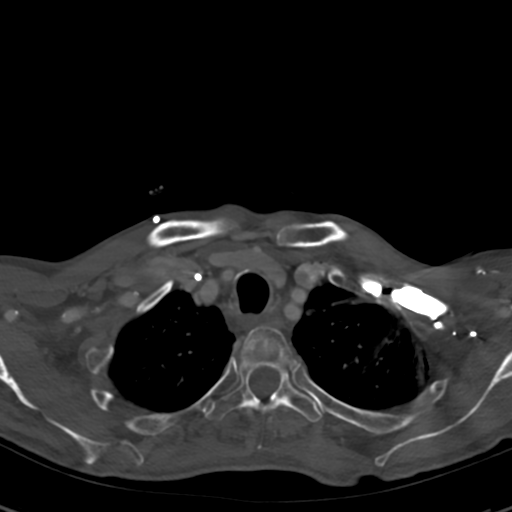
[im 63/126  bone]
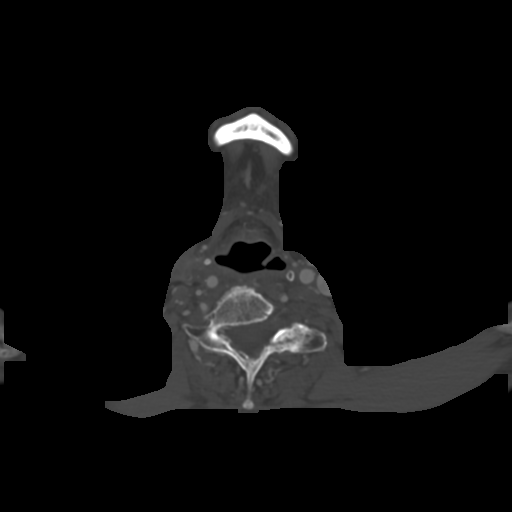

[Series 4: coronal neck neck (person_name) 2.00 cor · coronal · 0.54mm/px · 3 of 111 slices shown]
[im 24/111  bone]
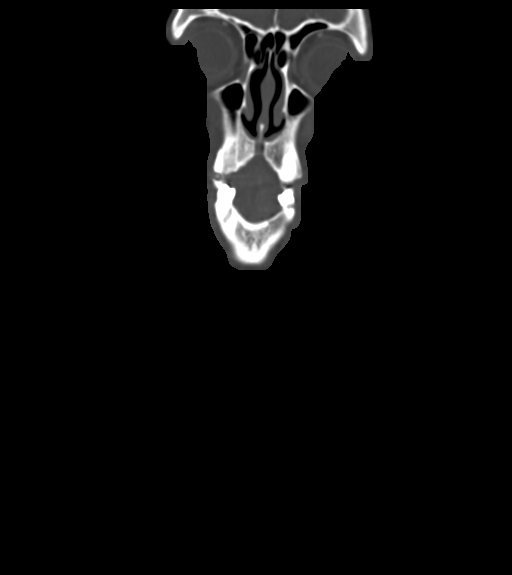
[im 45/111  bone]
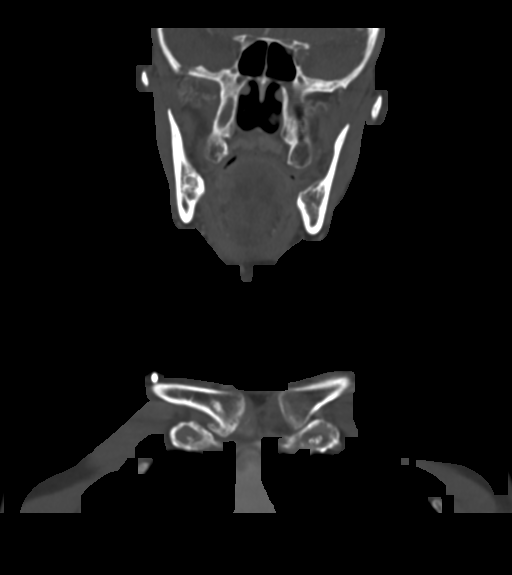
[im 66/111  bone]
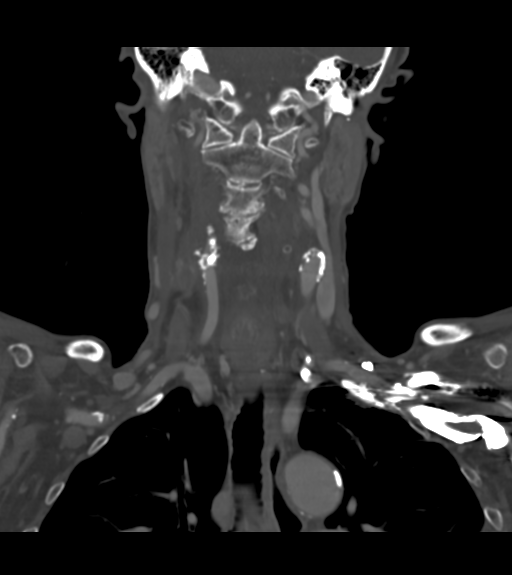

[Series 6: sagittal neck neck (person_name) 2.00 sag · sagittal · 0.44mm/px · 5 of 137 slices shown, 6 images]
[im 46/137  bone]
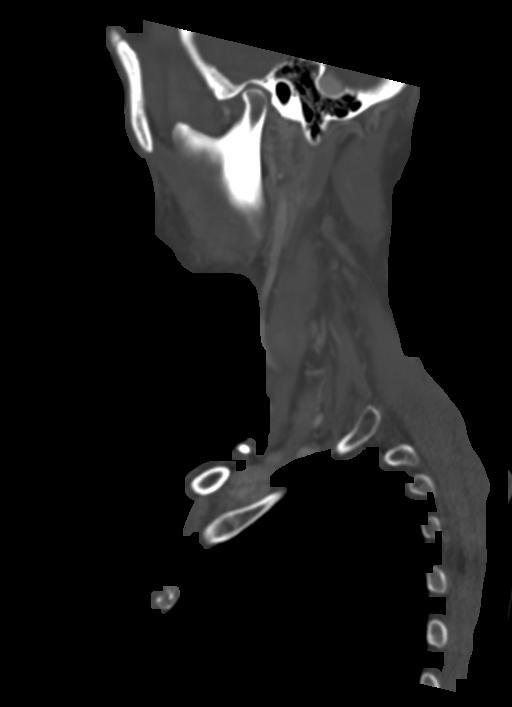
[im 57/137  bone]
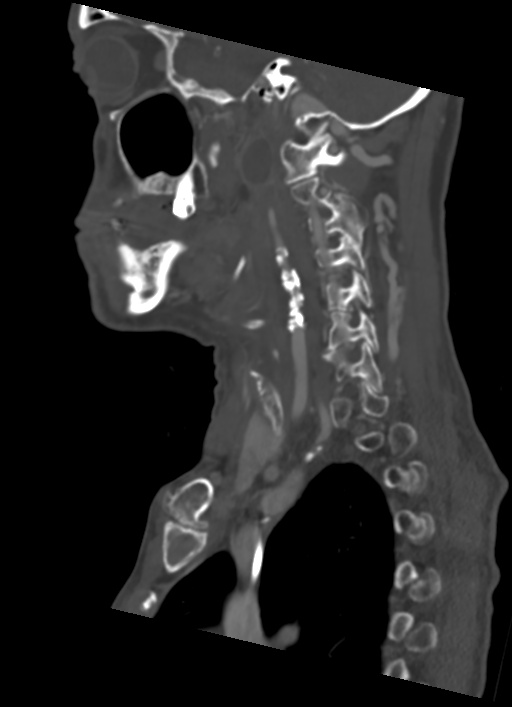
[im 69/137  soft-tissue]
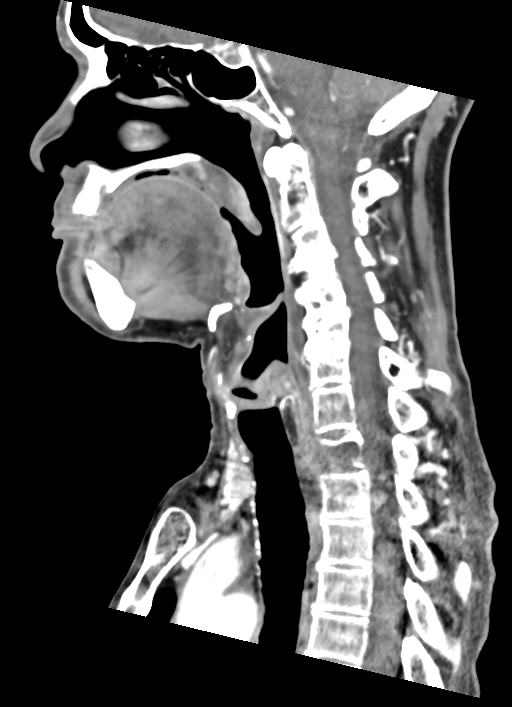
[im 69/137  bone]
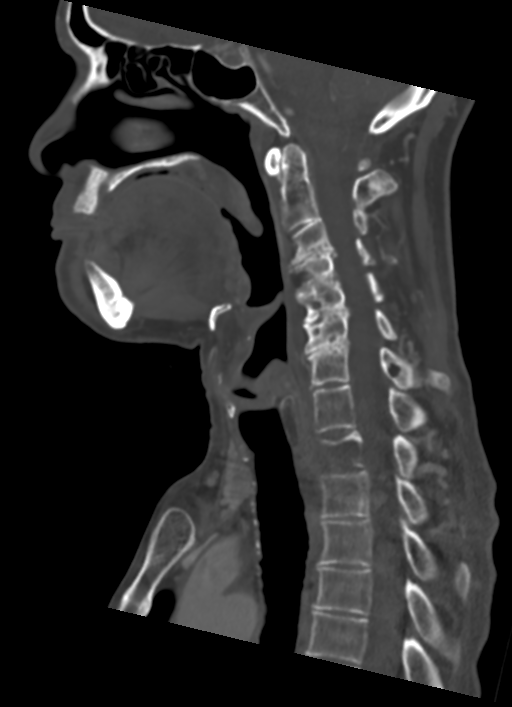
[im 80/137  bone]
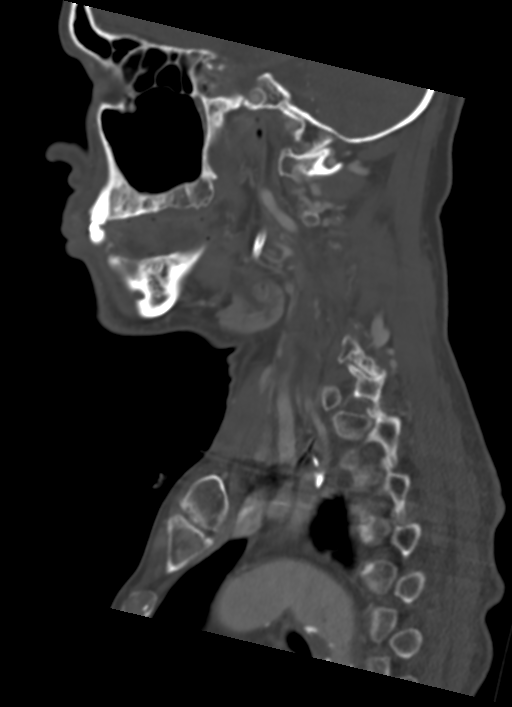
[im 91/137  bone]
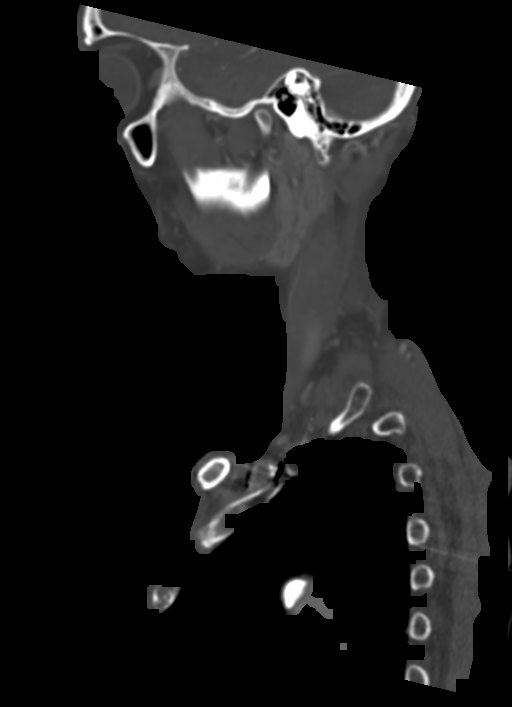

[Series 8: ax oropharynx neck neck (person_name) 2.00 ax · axial · 0.44mm/px · z∈[-780,-633]mm · 3 of 154 slices shown, 4 images]
[im 39/154  soft-tissue]
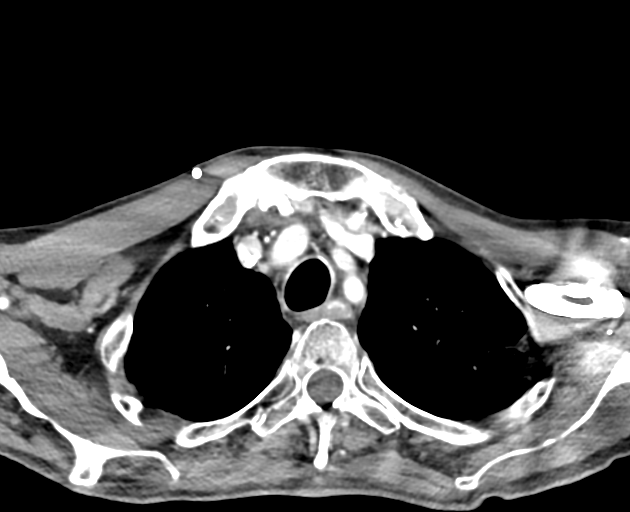
[im 39/154  bone]
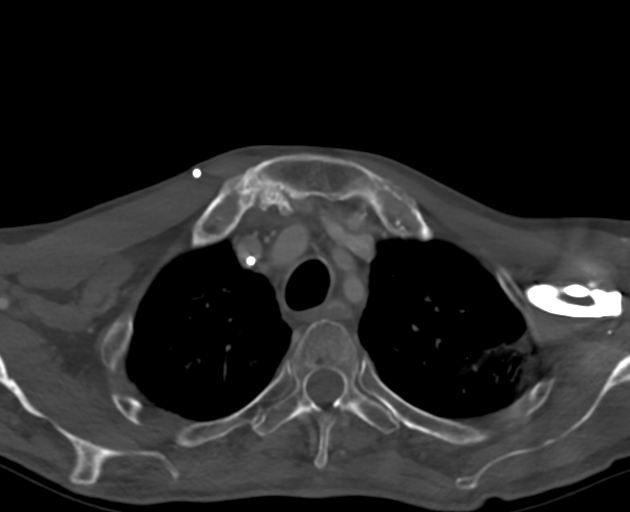
[im 77/154  bone]
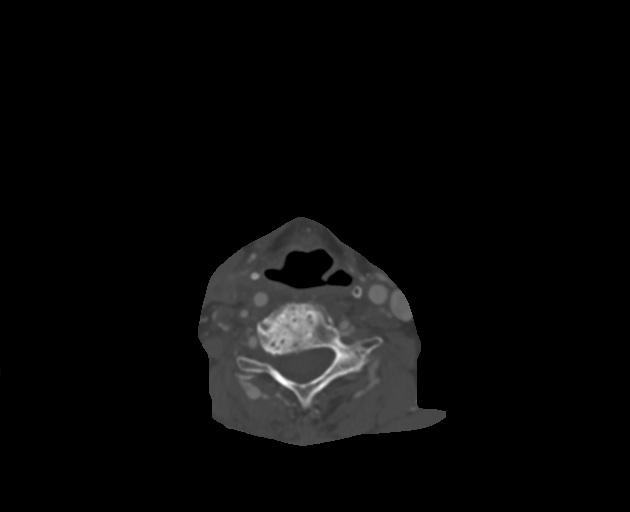
[im 115/154  bone]
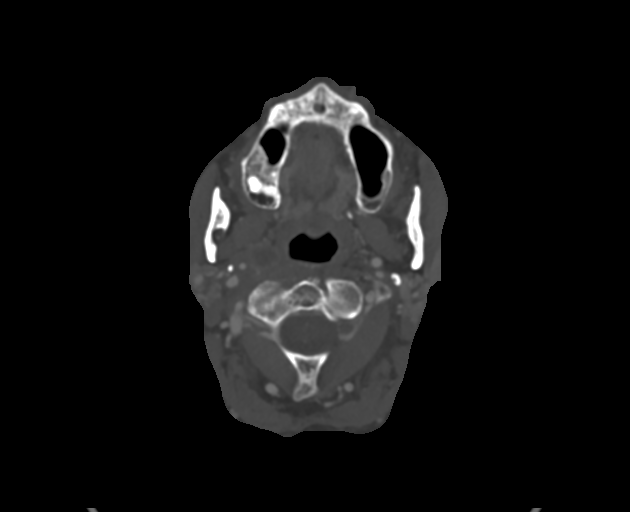

[13 of 33 positions shown; findings below may reference images not displayed]

FINDINGS: Pharynx and larynx: Progressive thickening of the vocal cords
bilaterally. This shows irregular enhancement and could be due to
tumor versus radiation change. Direct visualization is recommended.
Epiglottis normal. Oropharynx negative for recurrent tumor.

Salivary glands: Parotid normal bilaterally. Atrophic submandibular
gland bilaterally without acute abnormality.

Thyroid: Negative

Lymph nodes: 17 mm lymph node in the right mid neck at the level of
the hyoid bone is new since the prior study. This has irregular
enhancement and is consistent with metastatic disease. This is
compressing the right jugular vein with thrombus above and below the
tumor

Cystic retropharyngeal lymph node on the right 14 mm, unchanged from
the prior study.

Vascular: Thrombus in the right jugular vein due to compression by
malignant lymph node in the right mid neck.

Severe atherosclerotic disease in the carotid artery bilaterally
which remain patent bilaterally. Atherosclerotic calcification
aortic arch.

Limited intracranial: Negative

Visualized orbits: Chronic fracture right medial orbit unchanged.

Mastoids and visualized paranasal sinuses: Negative

Skeleton: Lytic destructive lesion T2 vertebral body on the right
with mild pathologic fracture unchanged from the prior study.

Metastatic disease is left scapula with pathologic fracture.
Fracture not seen on prior studies.

Upper chest: Lung apices clear bilaterally. Port-A-Cath tip right
jugular vein.

Other: None
IMPRESSION: 1. New malignant lymph node in the right mid neck at the level the
hyoid. This is compressing the right jugular vein with thrombus in
the vein above and below the lymph node. Cystic retropharyngeal
lymph node on the right is stable
2. Progressive soft tissue thickening in the larynx bilaterally
suspicious for tumor. Recommend direct visualization to
differentiate from radiation change.
3. Metastatic disease at T2 is unchanged
4. Metastatic deposit left scapula. There is now a pathologic
fracture of the scapula.

## 2020-06-29 MED ORDER — IOHEXOL 300 MG/ML  SOLN
75.0000 mL | Freq: Once | INTRAMUSCULAR | Status: AC | PRN
  Administered 2020-06-29: 75 mL via INTRAVENOUS

## 2020-07-04 ENCOUNTER — Other Ambulatory Visit: Payer: Self-pay

## 2020-07-04 DIAGNOSIS — C3492 Malignant neoplasm of unspecified part of left bronchus or lung: Secondary | ICD-10-CM

## 2020-07-05 ENCOUNTER — Encounter: Payer: Self-pay | Admitting: Oncology

## 2020-07-05 ENCOUNTER — Other Ambulatory Visit: Payer: Self-pay

## 2020-07-05 ENCOUNTER — Inpatient Hospital Stay (HOSPITAL_BASED_OUTPATIENT_CLINIC_OR_DEPARTMENT_OTHER): Payer: Medicare Other | Admitting: Oncology

## 2020-07-05 ENCOUNTER — Other Ambulatory Visit: Payer: Self-pay | Admitting: Oncology

## 2020-07-05 ENCOUNTER — Inpatient Hospital Stay: Payer: Medicare Other

## 2020-07-05 VITALS — BP 137/79 | HR 71 | Temp 98.3°F | Resp 17 | Wt 109.0 lb

## 2020-07-05 DIAGNOSIS — C3492 Malignant neoplasm of unspecified part of left bronchus or lung: Secondary | ICD-10-CM | POA: Diagnosis not present

## 2020-07-05 DIAGNOSIS — C109 Malignant neoplasm of oropharynx, unspecified: Secondary | ICD-10-CM | POA: Diagnosis not present

## 2020-07-05 DIAGNOSIS — C7951 Secondary malignant neoplasm of bone: Secondary | ICD-10-CM

## 2020-07-05 DIAGNOSIS — R634 Abnormal weight loss: Secondary | ICD-10-CM

## 2020-07-05 DIAGNOSIS — F0390 Unspecified dementia without behavioral disturbance: Secondary | ICD-10-CM

## 2020-07-05 DIAGNOSIS — G893 Neoplasm related pain (acute) (chronic): Secondary | ICD-10-CM

## 2020-07-05 DIAGNOSIS — Z95828 Presence of other vascular implants and grafts: Secondary | ICD-10-CM

## 2020-07-05 DIAGNOSIS — Z7189 Other specified counseling: Secondary | ICD-10-CM

## 2020-07-05 LAB — COMPREHENSIVE METABOLIC PANEL
ALT: 18 U/L (ref 0–44)
AST: 21 U/L (ref 15–41)
Albumin: 4 g/dL (ref 3.5–5.0)
Alkaline Phosphatase: 68 U/L (ref 38–126)
Anion gap: 12 (ref 5–15)
BUN: 15 mg/dL (ref 8–23)
CO2: 23 mmol/L (ref 22–32)
Calcium: 9.5 mg/dL (ref 8.9–10.3)
Chloride: 103 mmol/L (ref 98–111)
Creatinine, Ser: 0.98 mg/dL (ref 0.61–1.24)
GFR, Estimated: >60 mL/min (ref >60)
Glucose, Bld: 92 mg/dL (ref 70–99)
Potassium: 3.9 mmol/L (ref 3.5–5.1)
Sodium: 138 mmol/L (ref 135–145)
Total Bilirubin: 1.2 mg/dL (ref 0.3–1.2)
Total Protein: 7.7 g/dL (ref 6.5–8.1)

## 2020-07-05 LAB — CBC WITH DIFFERENTIAL/PLATELET
Abs Immature Granulocytes: 0.02 10*3/uL (ref 0.00–0.07)
Basophils Absolute: 0 10*3/uL (ref 0.0–0.1)
Basophils Relative: 0 %
Eosinophils Absolute: 0.1 10*3/uL (ref 0.0–0.5)
Eosinophils Relative: 1 %
HCT: 37.2 % — ABNORMAL LOW (ref 39.0–52.0)
Hemoglobin: 11.9 g/dL — ABNORMAL LOW (ref 13.0–17.0)
Immature Granulocytes: 0 %
Lymphocytes Relative: 13 %
Lymphs Abs: 0.8 10*3/uL (ref 0.7–4.0)
MCH: 28.2 pg (ref 26.0–34.0)
MCHC: 32 g/dL (ref 30.0–36.0)
MCV: 88.2 fL (ref 80.0–100.0)
Monocytes Absolute: 0.7 10*3/uL (ref 0.1–1.0)
Monocytes Relative: 11 %
Neutro Abs: 4.6 10*3/uL (ref 1.7–7.7)
Neutrophils Relative %: 75 %
Platelets: 195 10*3/uL (ref 150–400)
RBC: 4.22 MIL/uL (ref 4.22–5.81)
RDW: 13.9 % (ref 11.5–15.5)
WBC: 6.1 10*3/uL (ref 4.0–10.5)
nRBC: 0 % (ref 0.0–0.2)

## 2020-07-05 LAB — MAGNESIUM: Magnesium: 1.8 mg/dL (ref 1.7–2.4)

## 2020-07-05 MED ORDER — APIXABAN 2.5 MG PO TABS: 2.5000 mg | ORAL_TABLET | Freq: Two times a day (BID) | ORAL | 1 refills | Status: DC

## 2020-07-05 NOTE — Progress Notes (Signed)
Hematology/Oncology progress note [facility] Telephone:([phone] [phone]   Patient Care Team: Casilda Carls, MD as PCP - General (Internal Medicine) Earlie Server, [name], [name]  HISTORY OF PRESENTING ILLNESS:  Gregory Burton is a  80 y.o.  male with PMH listed below was seen in consultation at the request of  Casilda Carls, MD  for evaluation of lymph node.  Patient moved from [place] to [place] for about 3 weeks. He has noticed a neck knot on the right side of his neck. The knot is sore and makes him to cough and he feels it when he swallows. No swallowing difficulty.  Patient was accompanied by his wife. She reports that patient's previous PCP has done neck sonogram for evaluation.  Patient also had right lower molar tooth extraction done a few weeks before he noticed  He also took a course of antibiotics.  Due to his tooth pain, his appetite has decreased and he has lost weight loss, about 20 pounds, he can not specify the time frame.   Alcoholism History of prostate cancer. S/p Radiation/seed, he follows up with [facility].   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  Interval decrease of the posterior left upper lobe pleural-based nodule  with interval decrease in size of the tiny adjacent satellite nodule.  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  No recurrent tumor in right piriform sinus.  Right lateral pharyngeal lymph node is stable.  No new or recurrent adenopathy.Suspect metastasis.  I recommend patient to obtain PET scan restaging.  I discussed about the plan of re-biopsy of either the paraspinal soft tissue mass versus other more feasible hypermetabolic sites detected on PET scan. Patient was seen by Dr. Kennyth Lose and opted to proceed with palliative radiation as soon as possible.  PET scan cannot be arranged prior to the radiation so the PET scan was canceled. 04/20/2020 -04/25/2020, palliative radiation to thoracic spine  05/10/2020, MRI thoracic spine was obtained for worsening of back pain and left shoulder pain.  Images showed metastatic disease throughout almost the entire T2 with and associated mild superior endplate compression fracture.  Abnormal signal in the anterior, inferior endplate of T1, superior aspect of the T3.  Small metastatic deposit in T12 is also noted. Case was also discussed on multidisciplinary tumor board on 05/25/2020 05/22/2020, PET scan showed new hypermetabolic lytic bone metastasis in the left scapular near the glenoid and in the T2 vertebral body.  Persistent hypermetabolic right lateral retropharyngeal and right level 3 neck lymph node metastasis.  Peripheral left upper lobe pulmonary nodule is mildly decreased with new surrounding mild platelike postradiation changes..  No residual recurrent neoplasm at the site of vomiting in the right piriform sinus  06/07/2020 -06/09/2020 patient underwent palliative radiation to left scapular  INTERVAL HISTORY Gregory Burton is  a 80 y.o. male who has above history reviewed by me today presents for follow-up of head and neck cancer and lung cancer Patient was accompanied by wife. Patient was recently seen by [name] and flexible  laryngoscope examination showed new lesion at the right false vocal cord.  Case was discussed on tumor board on 06/22/2020. [name] recommend additional CT soft tissue neck with contrast for additional evaluation. Patient had a CT done on 06/29/2020.  Patient and his wife present to discuss results and further management plan. Patient was also referred to establish care with Dr. Georgiann Cocker at [facility] for kyphoplasty.  Patient's wife reports that they were never contacted and she called Dr. Georgiann Cocker office with no success.  Today patient reports cough, hoarseness and weakness.  He has lost 3 pounds since last visit 4 to 5 weeks ago.  Back pain has improved.  NGS.  PD-L1-IHC 40, no targetable mutations.  Review of Systems  Constitutional: Positive for fatigue. Negative for appetite change, chills, fever and unexpected weight change.  HENT:   Positive for hearing loss and voice change.   Eyes: Negative for eye problems and icterus.  Respiratory: Negative for chest tightness, cough and shortness of breath.   Cardiovascular: Negative for chest pain and leg swelling.  Gastrointestinal: Negative for abdominal distention, abdominal pain, blood in stool and nausea.  Endocrine: Negative for hot flashes.  Genitourinary: Negative for difficulty urinating, dysuria and frequency.   Musculoskeletal: Positive for arthralgias.  Skin: Negative for itching and rash.  Neurological: Negative for extremity weakness, light-headedness and numbness.  Hematological: Negative for adenopathy. Does not bruise/bleed easily.  Psychiatric/Behavioral: Negative for confusion.       Forgetful    MEDICAL HISTORY:  Past Medical History:  Diagnosis Date  . Alcohol abuse   . Blood transfusion without reported diagnosis 2013  . Cataract   . Dementia ([place])   . Glaucoma   . Gout   . Hypercholesteremia   . Hypertension   . 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 Prostate CA ([place]) 2008  . Squamous cell lung cancer ([place]) 06/23/2019     SURGICAL HISTORY: Past Surgical History:  Procedure Laterality Date  . BRAIN SURGERY  2012  . HERNIA REPAIR    . PORTA CATH INSERTION N/A 07/27/2019   Procedure: PORTA CATH INSERTION;  Surgeon: Katha Cabal, MD;  Location: [facility];  Service: Cardiovascular;  Laterality: N/A;    SOCIAL HISTORY: Social History   Socioeconomic History  . Marital status: Married    Spouse name: Not on file  . Number of children: Not on file  . Years of education: Not on file  . Highest education level: Not on file  Occupational History  . Not on file  Tobacco Use  . Smoking status: Former Smoker    Years: 21.00    Types: Cigarettes    Quit date: 05/11/2008    Years since quitting: 12.1  . Smokeless tobacco: Never Used  Vaping Use  . Vaping Use: Never used  Substance and Sexual Activity  . Alcohol use: Yes    Comment: 0.5 pint liquor a week  . Drug use: Never  . Sexual activity: Not Currently  Other Topics Concern  . Not on file  Social History Narrative   Lives at home with wife. Wife states he has some dementia but is able to sign his own consent.   Social Determinants of Health   Financial Resource Strain: Not on file  Food Insecurity: Not  on file  Transportation Needs: Not on file  Physical Activity: Not on file  Stress: Not on file  Social Connections: Not on file  Intimate Partner Violence: Not on file    FAMILY HISTORY: History reviewed. No pertinent family history.  ALLERGIES:  is allergic to enalapril.  MEDICATIONS:  Current Outpatient Medications  Medication Sig Dispense Refill  . apixaban (ELIQUIS) 2.5 MG TABS tablet Take 1 tablet (2.5 mg total) by mouth 2 (two) times daily. 60 tablet 1  . atorvastatin (LIPITOR) 20 MG tablet Take 20 mg by mouth daily.    Marland Kitchen donepezil (ARICEPT) 10 MG tablet Take 10 mg by mouth at bedtime.    . dorzolamide-timolol (COSOPT) 22.3-6.8 MG/ML ophthalmic solution Place 1 drop into both eyes 2 (two) times daily.     .  folic acid (FOLVITE) 1 MG tablet Take 1 mg by mouth daily.    Marland Kitchen latanoprost (XALATAN) 0.005 % ophthalmic solution Place 1 drop into both eyes at bedtime.     . lidocaine-prilocaine (EMLA) cream Apply 1 application topically as needed. Apply small amount to port site approx 1-2 hours prior to appointment. 30 g 1  . Magnesium Chloride (MAG64) 64 MG TBEC TAKE 1 TABLET (64 MG TOTAL) BY MOUTH 2 (TWO) TIMES DAILY. 180 tablet 0  . megestrol (MEGACE) 40 MG tablet Take 1 tablet (40 mg total) by mouth 2 (two) times daily. 60 tablet 0  . Multiple Vitamins-Minerals (CENTRUM ADULTS PO) Take by mouth.    . oxyCODONE (OXY IR/ROXICODONE) 5 MG immediate release tablet Take 1 tablet (5 mg total) by mouth every 4 (four) hours as needed for severe pain. 90 tablet 0  . Thiamine HCl (VITAMIN B-1 PO) Take 100 mg by mouth daily.     No current facility-administered medications for this visit.   Facility-Administered Medications Ordered in Other Visits  Medication Dose Route Frequency Provider Last Rate Last Admin  . sodium chloride flush (NS) 0.9 % injection 10 mL  10 mL Intravenous PRN Earlie Server, MD   10 mL at 03/01/20 4888     PHYSICAL EXAMINATION: ECOG PERFORMANCE STATUS: 1 - Symptomatic but completely ambulatory Vitals:   07/05/20 1111  BP: 137/79  Pulse: 71  Resp: 17  Temp: 98.3 F (36.8 C)  SpO2: 100%   Filed Weights   07/05/20 1111  Weight: 109 lb (49.4 kg)    Physical Exam Constitutional:      General: He is not in acute distress. HENT:     Head: Normocephalic and atraumatic.  Eyes:     General: No scleral icterus. Neck:     Comments: Palpable right cervical lymphadenopathy  Cardiovascular:     Rate and Rhythm: Normal rate and regular rhythm.     Heart sounds: Normal heart sounds.  Pulmonary:     Effort: Pulmonary effort is normal. No respiratory distress.     Breath sounds: No wheezing.  Abdominal:     General: Bowel sounds are normal. There is no distension.     Palpations: Abdomen  is soft.  Musculoskeletal:        General: No deformity.     Cervical back: Normal range of motion and neck supple.     Comments: Left shoulder pain has improved  Skin:    General: Skin is warm and dry.     Findings: No erythema or rash.  Neurological:     Mental Status: He is alert. Mental status is at baseline.     Cranial Nerves: No cranial  nerve deficit.     Coordination: Coordination normal.     LABORATORY DATA:  I have reviewed the data as listed Lab Results  Component Value Date   WBC 6.1 07/05/2020   HGB 11.9 (L) 07/05/2020   HCT 37.2 (L) 07/05/2020   MCV 88.2 07/05/2020   PLT 195 07/05/2020   Recent Labs    09/01/19 0751 09/08/19 0832 11/15/19 0818 12/31/19 0927 04/04/20 0908 05/11/20 1130 06/29/20 0825 07/05/20 1014  NA 135 140 141   < > 140 140  --  138  K 3.9 4.2 4.1   < > 4.0 3.8  --  3.9  CL 105 107 109   < > 106 101  --  103  CO2 21* 25 24   < > 27 26  --  23  GLUCOSE 100* 125* 98   < > 101* 108*  --  92  BUN 19 23 19    < > 13 15  --  15  CREATININE 0.86 0.98 0.88   < > 0.85 0.86 1.00 0.98  CALCIUM 8.7* 9.0 9.2   < > 9.1 9.2  --  9.5  GFRNONAA >60 >60 >60   < > >60 >60  --  >60  GFRAA >60 >60 >60  --   --   --   --   --   PROT 6.9 6.8 7.0   < > 7.2 7.4  --  7.7  ALBUMIN 3.2* 3.2* 3.9   < > 3.8 3.7  --  4.0  AST 16 16 17    < > 21 20  --  21  ALT 16 13 19    < > 19 14  --  18  ALKPHOS 55 56 41   < > 63 68  --  68  BILITOT 0.7 0.5 1.0   < > 1.0 1.0  --  1.2   < > = values in this interval not displayed.   Iron/TIBC/Ferritin/ %Sat No results found for: IRON, TIBC, FERRITIN, IRONPCTSAT    RADIOGRAPHIC STUDIES: I have personally reviewed the radiological images as listed and agreed with the findings in the report. CT SOFT TISSUE NECK W CONTRAST  Result Date: 06/29/2020 CLINICAL DATA:  Current head neck cancer. 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 Worsening hoarseness. EXAM: CT NECK WITH CONTRAST TECHNIQUE: Multidetector CT  imaging of the neck was performed using the standard protocol following the bolus administration of intravenous contrast. CONTRAST:  6m OMNIPAQUE IOHEXOL 300 MG/ML  SOLN COMPARISON:  CT neck 03/31/2020 FINDINGS: Pharynx and larynx: Progressive thickening of the vocal cords bilaterally. This shows irregular enhancement and could be due to tumor versus radiation change. Direct visualization is recommended. Epiglottis normal. Oropharynx negative for recurrent tumor. Salivary glands: Parotid normal bilaterally. Atrophic submandibular gland bilaterally without acute abnormality. Thyroid: Negative Lymph nodes: 17 mm lymph node in the right mid neck at the level of the hyoid bone is new since the prior study. This has irregular enhancement and is consistent with metastatic disease. This is compressing the right jugular vein with thrombus above and below the tumor Cystic retropharyngeal lymph node on the right 14 mm, unchanged from the prior study. Vascular: Thrombus in the right jugular vein due to compression by malignant lymph node in the right mid neck. Severe atherosclerotic disease in the carotid artery bilaterally which remain patent bilaterally. Atherosclerotic calcification aortic arch. Limited intracranial: Negative Visualized orbits: Chronic fracture right medial orbit unchanged. Mastoids and visualized paranasal sinuses: Negative Skeleton: Lytic destructive  lesion T2 vertebral body on the right with mild pathologic fracture unchanged from the prior study. Metastatic disease is left scapula with pathologic fracture. Fracture not seen on prior studies. Upper chest: Lung apices clear bilaterally. Port-A-Cath tip right jugular vein. Other: None IMPRESSION: 1. 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 This is compressing the right jugular vein with thrombus in the vein above and below the lymph node. Cystic retropharyngeal lymph node on the right is stable 2. Progressive soft tissue  thickening in the larynx bilaterally suspicious for tumor. Recommend direct visualization to differentiate from radiation change. 3. Metastatic disease at T2 is unchanged 4. Metastatic deposit left scapula. There is now a pathologic fracture of the scapula. Electronically Signed   By: Franchot Gallo M.D.   On: 06/29/2020 16:37  CT SOFT TISSUE NECK W CONTRAST  Result Date: 06/29/2020 CLINICAL DATA:  Current head neck cancer. 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 Worsening hoarseness. EXAM: CT NECK WITH CONTRAST TECHNIQUE: Multidetector CT imaging of the neck was performed using the standard protocol following the bolus administration of intravenous contrast. CONTRAST:  35m OMNIPAQUE IOHEXOL 300 MG/ML  SOLN COMPARISON:  CT neck 03/31/2020 FINDINGS: Pharynx and larynx: Progressive thickening of the vocal cords bilaterally. This shows irregular enhancement and could be due to tumor versus radiation change. Direct visualization is recommended. Epiglottis normal. Oropharynx negative for recurrent tumor. Salivary glands: Parotid normal bilaterally. Atrophic submandibular gland bilaterally without acute abnormality. Thyroid: Negative Lymph nodes: 17 mm lymph node in the right mid neck at the level of the hyoid bone is new since the prior study. This has irregular enhancement and is consistent with metastatic disease. This is compressing the right jugular vein with thrombus above and below the tumor Cystic retropharyngeal lymph node on the right 14 mm, unchanged from the prior study. Vascular: Thrombus in the right jugular vein due to compression by malignant lymph node in the right mid neck. Severe atherosclerotic disease in the carotid artery bilaterally which remain patent bilaterally. Atherosclerotic calcification aortic arch. Limited intracranial: Negative Visualized orbits: Chronic fracture right medial orbit unchanged. Mastoids and visualized paranasal sinuses: Negative Skeleton: Lytic  destructive lesion T2 vertebral body on the right with mild pathologic fracture unchanged from the prior study. Metastatic disease is left scapula with pathologic fracture. Fracture not seen on prior studies. Upper chest: Lung apices clear bilaterally. Port-A-Cath tip right jugular vein. Other: None IMPRESSION: 1. 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 This is compressing the right jugular vein with thrombus in the vein above and below the lymph node. Cystic retropharyngeal lymph node on the right is stable 2. Progressive soft tissue thickening in the larynx bilaterally suspicious for tumor. Recommend direct visualization to differentiate from radiation change. 3. Metastatic disease at T2 is unchanged 4. Metastatic deposit left scapula. There is now a pathologic fracture of the scapula. Electronically Signed   By: CFranchot GalloM.D.   On: 06/29/2020 16:37   MR Thoracic Spine W Wo Contrast  Result Date: 05/10/2020 CLINICAL DATA:  History of head and neck cancer and lung cancer. Right shoulder pain. Lesion in the thoracic spine at T2 on chest CT 03/31/2020. EXAM: MRI THORACIC WITHOUT AND WITH CONTRAST TECHNIQUE: Multiplanar and multiecho pulse sequences of the thoracic spine were obtained without and with intravenous contrast. CONTRAST:  5 mL GADAVIST IV SOLN COMPARISON:  Chest CT 03/31/2020. FINDINGS: Alignment:  Maintained. Vertebrae: There is abnormal signal  and enhancement throughout the T2 vertebral body extending into the pedicles and right facets. Mild concavity of the superior endplate of T2 is consistent with pathologic fracture. A small focus of edema and enhancement is seen in the anterior, inferior endplate of T1. There is patchy edema and enhancement in the superior 0.7 cm of T3. Also seen is a T1 hypointense, T2 hyperintense and enhancing lesion in T12 eccentric to the left measuring 0.9 cm in diameter which is likely due to metastatic disease. Depleted marrow at C7,  T1 and through the majority of T3 is consistent with prior radiation therapy. Cord:  Normal signal throughout.  No epidural tumor is identified. Paraspinal and other soft tissues: Pleural based lesion left upper lobe seen on prior CT is difficult to visualize on this exam. Disc levels: No bulge or protrusion at any level. The central canal and foramina are widely patent throughout. IMPRESSION: Findings consistent with metastatic disease throughout almost the entirety of T2 with an associated mild superior endplate compression fracture. Abnormal signal in the anterior, inferior endplate of T1 and superior aspect of T3 is also likely due to metastatic disease. Small metastatic deposit in T12 is also noted. Negative for epidural tumor. The central canal and foramina are widely patent at all levels. Appearance of the visualized cervical and upper thoracic spine through T4 consistent with prior radiation therapy. Electronically Signed   By: Inge Rise M.D.   On: 05/10/2020 09:27   NM PET Image Restage (PS) Skull Base to Thigh  Result Date: 05/23/2020 CLINICAL DATA:  Subsequent treatment strategy for metastatic 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 biopsy-proven left upper lobe squamous cell lung carcinoma. EXAM: NUCLEAR MEDICINE PET SKULL BASE TO THIGH TECHNIQUE: 6.4 mCi F-18 FDG was injected intravenously. Full-ring PET imaging was performed from the skull base to thigh after the radiotracer. CT data was obtained and used for attenuation correction and anatomic localization. Fasting blood glucose: 63 mg/dl COMPARISON:  05/10/2020 MRI thoracic spine. 03/31/2020 CT neck and chest. 06/07/2019 PET-CT. FINDINGS: Mediastinal blood pool activity: SUV max 2.0 Liver activity: SUV max NA NECK: Persistent enlarged hypermetabolic 1.4 cm short axis diameter right lateral retropharyngeal centrally necrotic node with max SUV 3.5 (series 3/image 20), not substantially changed from 03/31/2020  neck CT. Hypermetabolic 0.8 cm right level 3 neck lymph node with max SUV 4.7 (series 3/image 48), not appreciably changed in size since 03/31/2020 neck CT, decreased from 1.9 cm with max SUV 5.6 on 06/07/2019 PET-CT. No evidence of residual or recurrent hypermetabolism at the site of the primary tumor in the right piriform sinus. No hypermetabolic left neck lymph nodes. Incidental CT findings: Right internal jugular Port-A-Cath terminates at the cavoatrial junction. CHEST: Peripheral left upper lobe 1.8 x 1.5 cm pulmonary nodule demonstrates low level hypermetabolism with max SUV 2.7 (series 3/image 89), mildly decreased from 2.1 x 1.7 cm on 03/31/2020 chest CT. New patchy bandlike consolidation in the adjacent superior segment left lower lobe compatible with early postradiation change. No enlarged or hypermetabolic axillary, mediastinal or hilar lymph nodes. Incidental CT findings: Coronary atherosclerosis. Atherosclerotic nonaneurysmal thoracic aorta. ABDOMEN/PELVIS: No abnormal hypermetabolic activity within the liver, pancreas, adrenal glands, or spleen. No hypermetabolic lymph nodes in the abdomen or pelvis. Incidental CT findings: Brachytherapy seeds in the prostate. Marked diffuse colonic diverticulosis. Atherosclerotic nonaneurysmal abdominal aorta. SKELETON: New hypermetabolic lytic mildly expansile 2.6 cm left scapula lesion near the glenoid with max SUV 12.3. New hypermetabolic lytic T2 vertebral lesion with max SUV 4.5. Incidental  CT findings: none IMPRESSION: 1. New hypermetabolic lytic bone metastases in the left scapula near the glenoid and in the T2 vertebral body. 2. Persistent hypermetabolic right lateral retropharyngeal and right level 3 neck lymph node metastases. 3. Peripheral left upper lobe pulmonary nodule is mildly decreased since 03/31/2020 chest CT with new surrounding mild bandlike postradiation changes, compatible with treatment response. Low level hypermetabolism within this nodule is  nonspecific. Continued chest CT or PET-CT follow-up advised. 4. No residual or recurrent hypermetabolism at the site of the primary tumor in the right para form sinus. 5. Chronic findings include: Aortic Atherosclerosis (ICD10-I70.0). Coronary atherosclerosis. Marked colonic diverticulosis. Electronically Signed   By: Ilona Sorrel M.D.   On: 05/23/2020 09:27       ASSESSMENT & PLAN:  1. 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 Dementia without behavioral disturbance, unspecified dementia type ([place])   4. Weight loss   5. Goals of care, counseling/discussion   6. Neoplasm related pain   7. Port-A-Cath in place   8. 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 There is new malignant lymph node in the right mid neck at the level of the hyoid, this compressing the right jugular vein with thrombus in the vein above and below the lymph node.  Cystic retropharyngeal lymph node on the right is stable.  Progressive soft tissue thickening in the larynx bilaterally suspicious for tumor.  Metastatic disease at the T2 is unchanged.  Metastatic deposit left scapula there is now a pathological fracture of the scapular. CT scan findings were discussed with ENT [name] via secure chat.  [name] feels surgery is not feasible.   The larynx lesion currently does not compromise his airway however further progression may in the future  Given the fact that now he has new larynx lesion, retropharyngeal lymphadenopathy, new right cervical lymphadenopathy, clinically patient has recurrent metastatic stage IV disease, likely due to head and neck squamous cell carcinoma.  Previously biopsy-proven left upper lobe lung SCC nodule size mildly decreased.  I will further discuss with radiation oncology Dr. Baruch Gouty if additional palliative radiation is feasible.  Image findings were reviewed and discussed with patient and wife.  Also called patient's son Jesse Fall and updated. We discussed that the patient's condition is not curable, prognosis is poor given his multiple comorbidities including dementia, performance status, malnutrition and poor insights of his condition. Systemic chemotherapy can be considered with palliative intent however may cause side effects which further impair his current life quality.  Patient previously had moderate to severe difficulties tolerating concurrent chemotherapy with his radiation. If patient and family desire additional systemic treatment, I would offer low-dose 5-FU+/- platinum /Keytruda. Rationale of chemotherapy and potential side effects were discussed.  If patient's not able to tolerate or if he desires to focus on current life quality, then I think comfort care/hospice  is appropriate.  Patient and his family would like to further discuss and then update me. Recommend patient to follow-up with palliative care service for further discussion.  #Neoplasm related pain, pain has improved after palliative radiation. #Left upper lobe squamous cell carcinoma, stage I Finished SBRT on 02/07/2020.  Possible chemotherapy regimen, consider cisplatin/5-FU/Keytruda.   #Severe chronic hypomagnesemia, continue slow magnesium 1 tablet twice daily.    #Port-A-Cath in place, recommend port flush every 8  weeks. All questions were answered. The patient knows to call the clinic with any problems questions or concerns.   Return of visit: To be determined.  ` Earlie Server, MD, PhD Hematology Oncology [facility] at [facility] Pager- [phone] 07/05/2020

## 2020-07-05 NOTE — Progress Notes (Signed)
Patient here for oncology follow-up appointment, expresses concerns of cough, hoarseness and weakness.

## 2020-07-09 ENCOUNTER — Telehealth: Payer: Self-pay | Admitting: Oncology

## 2020-07-09 NOTE — Telephone Encounter (Signed)
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 Started on Eliquis 3 days ago. Surgery is not an option. XRT and palliative chemo is planned.   Patient's wife called stating patient is complaining of abdominal pain with hiccups. Reports some sob. No palpatations. No chest pain. Generalized weakness. Recently started eliquis. Has had approximately 4-5 doses. Unsure that eliquis is contributing to this. Does not feel he needs to be seen in ED.   Has a scheduled appt with Dr. Donella Stade tomorrow. Would like to be evaluated after radiation. [facility] appt made.   Faythe Casa, NP 07/09/2020 3:53 PM

## 2020-07-10 ENCOUNTER — Inpatient Hospital Stay (HOSPITAL_BASED_OUTPATIENT_CLINIC_OR_DEPARTMENT_OTHER): Payer: Medicare Other | Admitting: Nurse Practitioner

## 2020-07-10 ENCOUNTER — Other Ambulatory Visit: Payer: Self-pay

## 2020-07-10 ENCOUNTER — Ambulatory Visit
Admission: RE | Admit: 2020-07-10 | Discharge: 2020-07-10 | Disposition: A | Discharge status: Home / Self Care | Payer: Medicare Other | Source: Ambulatory Visit | Attending: Radiation Oncology | Admitting: Radiation Oncology

## 2020-07-10 ENCOUNTER — Encounter: Payer: Self-pay | Admitting: Radiation Oncology

## 2020-07-10 VITALS — BP 147/87 | HR 79 | Temp 99.1°F | Resp 18

## 2020-07-10 VITALS — BP 145/87 | HR 76 | Temp 96.5°F | Resp 20 | Wt 108.1 lb

## 2020-07-10 DIAGNOSIS — C7839 Secondary malignant neoplasm of other respiratory organs: Secondary | ICD-10-CM | POA: Diagnosis not present

## 2020-07-10 DIAGNOSIS — C7951 Secondary malignant neoplasm of bone: Secondary | ICD-10-CM | POA: Diagnosis present

## 2020-07-10 DIAGNOSIS — C01 Malignant neoplasm of base of tongue: Secondary | ICD-10-CM | POA: Insufficient documentation

## 2020-07-10 DIAGNOSIS — F1027 Alcohol dependence with alcohol-induced persisting dementia: Secondary | ICD-10-CM | POA: Diagnosis not present

## 2020-07-10 DIAGNOSIS — C7802 Secondary malignant neoplasm of left lung: Secondary | ICD-10-CM | POA: Insufficient documentation

## 2020-07-10 DIAGNOSIS — I82C11 Acute embolism and thrombosis of right internal jugular vein: Secondary | ICD-10-CM | POA: Insufficient documentation

## 2020-07-10 DIAGNOSIS — C778 Secondary and unspecified malignant neoplasm of lymph nodes of multiple regions: Secondary | ICD-10-CM | POA: Insufficient documentation

## 2020-07-10 DIAGNOSIS — K219 Gastro-esophageal reflux disease without esophagitis: Secondary | ICD-10-CM

## 2020-07-10 DIAGNOSIS — C3492 Malignant neoplasm of unspecified part of left bronchus or lung: Secondary | ICD-10-CM | POA: Diagnosis not present

## 2020-07-10 DIAGNOSIS — Z7901 Long term (current) use of anticoagulants: Secondary | ICD-10-CM | POA: Diagnosis not present

## 2020-07-10 DIAGNOSIS — Z923 Personal history of irradiation: Secondary | ICD-10-CM | POA: Diagnosis not present

## 2020-07-10 NOTE — Progress Notes (Signed)
Radiation Oncology Follow up Note OP N/A old patient new area recurrent head and neck cancer  Name: Gregory Burton   Date:   07/10/2020 MRN:  [id number] DOB: [birth year]    This 80 y.o. male presents to the clinic today for 1 month follow-up status post palliative radiation therapy to his left scapula and patient with now known head and neck recurrence.  REFERRING PROVIDER: Casilda Carls, MD  HPI: Patient is a.  80 year old male well-known to our department.  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  He also had a stage I left upper lobe squamous cell carcinoma.  He also was treated to his left lung lesion with SBRT.  More recently he has been treated for metastatic disease to his thoracic spine as well as left scapula both areas have achieved excellent palliative benefit.  He has recently been seen by ENT 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  PET CT scan from March showed persistent hypermetabolic activity in the right lateral retropharyngeal region as well as level 3 lymph nodes..  Recent CT scan of the soft tissue of the head and neck showed malignant lymph node in the right mid neck at the level of the hyoid compressing the right jugular vein with thrombosis.  He also had a cystic retropharyngeal lymph node on the right side and some progressive soft tissue thickening in the larynx bilaterally suspicious for tumor.  Patient specifically denies dysphagia or head and neck pain.  He has been started on Eliquis for his right jugular thrombus.  He has had excellent palliative benefit from his palliative treatment to his thoracic spine and scapula.  COMPLICATIONS OF TREATMENT: none  FOLLOW UP COMPLIANCE: keeps appointments   PHYSICAL EXAM:  BP (!) 145/87   Pulse 76   Temp (!) 96.5 F (35.8 C)   Resp 20   Wt 108 lb 1.6 oz (49 kg)   SpO2 100%   BMI 16.93 kg/m  Patient has prominent  mid cervical lymph node on exam.  No evidence of oropharyngeal lesions.  Well-developed well-nourished patient in NAD. HEENT reveals PERLA, EOMI, discs not visualized.  Oral cavity is clear. No oral mucosal lesions are identified. Neck is clear without evidence of cervical or supraclavicular adenopathy. Lungs are clear to A&P. Cardiac examination is essentially unremarkable with regular rate and rhythm without murmur rub or thrill. Abdomen is benign with no organomegaly or masses noted. Motor sensory and DTR levels are equal and symmetric in the upper and lower extremities. Cranial nerves II through XII are grossly intact. Proprioception is intact. No peripheral adenopathy or edema is identified. No motor or sensory levels are noted. Crude visual fields are within normal range.  RADIOLOGY RESULTS: CT scan and PET CT scans reviewed compatible with above-stated findings  PLAN: At this time based on [name] clinical findings as well as his PET and CT scan findings I would offer salvage radiation therapy to his head and neck.  I would treat the area of the larynx as well as malignant adenopathy in his head and neck with a IMRT course of 50 Gray in over 5 weeks.  I will try to spare as much previously rated tissue as possible.  Risks and benefits of treatment including possible side effects of dysphagia oral mucositis skin reaction fatigue and reirradiating previously treated tissue all were discussed in detail with the patient and his wife.  They both comprehend my treatment plan well.  I have personally set up and ordered CT simulation for later this week.  We will also discuss possibility of concurrent low-dose chemotherapy with [name].  I would like to take this opportunity to thank you for allowing me to participate in the care of your patient.Noreene Filbert, MD

## 2020-07-10 NOTE — Progress Notes (Signed)
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[place])   06/23/2019 Cancer Staging   Staging form: Pharynx - P16 Negative Oropharynx, AJCC 8th Edition - Clinical stage from 06/23/2019: Stage IVB (cT4b, cN2b, cM0, p16-) - Signed by Earlie Server, MD on 06/23/2019   07/19/2019 - 08/18/2019 Chemotherapy         05/22/2020 -  Chemotherapy    Patient is on Treatment Plan: HEAD/NECK CISPLATIN D1 + 5FU IVCI D1-4 Q21D + KEYTRUDA      Squamous cell lung cancer ([place])  06/23/2019 Initial Diagnosis   Squamous cell lung cancer ([place])   07/19/2019 Cancer Staging   Staging form: Lung, AJCC 8th Edition - Clinical: cT1, cN0, cM0 - Signed by Earlie Server, MD on 07/19/2019     Interval history- Patient is 80 year old male with history of alcohol abuse, dementia, head and neck cancer, and lung cancer who presents to Symptom Management clinic for complaints of belching and hiccups. Wife who provides majority of history says symptoms have been intermittently worsening over past few weeks. Symptoms occur primarily after patient eats then lays down. He hasn't taken anything for symptoms. Otherwise he feels at baseline. He expresses concern over his poor memory. Wife says he has history of dementia and previously saw neurology in [place] prior to moving to [facility]. He has been on aricept for some time. Pain in shoulder has improved since radiation. He saw Dr. Baruch Gouty today to discuss radiation for head and neck cancer.   Review of systems- Review of  Systems  Constitutional: Negative for chills, fever, malaise/fatigue and weight loss.  HENT: Negative for hearing loss, nosebleeds, sore throat and tinnitus.   Eyes: Negative for blurred vision and double vision.  Respiratory: Negative for cough, hemoptysis, shortness of breath and wheezing.   Cardiovascular: Negative for chest pain, palpitations and leg swelling.  Gastrointestinal: Positive for heartburn. Negative for abdominal pain, blood in stool, constipation, diarrhea, melena, nausea and vomiting.  Genitourinary: Negative for dysuria and urgency.  Musculoskeletal: Negative for back pain, falls, joint pain and myalgias.  Skin: Negative for itching and rash.  Neurological: Negative for dizziness, tingling, sensory change, loss of consciousness, weakness and headaches.  Endo/Heme/Allergies: Negative for environmental allergies. Does not bruise/bleed easily.  Psychiatric/Behavioral: Positive for memory loss. Negative for depression. The patient is not nervous/anxious and does not have insomnia.        Hx of alcoholism (60+ years of alcohol abuse per wife). Stop drinking 2 years ago.       Allergies  Allergen Reactions  . Enalapril Rash    Past Medical History:  Diagnosis Date  . Alcohol abuse   . Blood transfusion without reported diagnosis 2013  . Cataract   . Dementia ([place])   . Glaucoma   . Gout   . Hypercholesteremia   . Hypertension   . 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 Prostate CA ([place]) 2008  . Squamous cell lung cancer ([place]) 06/23/2019    Past Surgical History:  Procedure Laterality Date  . BRAIN SURGERY  2012  .  HERNIA REPAIR    . PORTA CATH INSERTION N/A 07/27/2019   Procedure: PORTA CATH INSERTION;  Surgeon: Katha Cabal, MD;  Location: [facility];  Service: Cardiovascular;  Laterality: N/A;    Social History   Socioeconomic History  . Marital status: Married    Spouse name: Not on file  . Number of children: Not on file  . Years of  education: Not on file  . Highest education level: Not on file  Occupational History  . Not on file  Tobacco Use  . Smoking status: Former Smoker    Years: 21.00    Types: Cigarettes    Quit date: 05/11/2008    Years since quitting: 12.1  . Smokeless tobacco: Never Used  Vaping Use  . Vaping Use: Never used  Substance and Sexual Activity  . Alcohol use: Yes    Comment: 0.5 pint liquor a week  . Drug use: Never  . Sexual activity: Not Currently  Other Topics Concern  . Not on file  Social History Narrative   Lives at home with wife. Wife states he has some dementia but is able to sign his own consent.   Social Determinants of Health   Financial Resource Strain: Not on file  Food Insecurity: Not on file  Transportation Needs: Not on file  Physical Activity: Not on file  Stress: Not on file  Social Connections: Not on file  Intimate Partner Violence: Not on file    No family history on file.   Current Outpatient Medications:  .  apixaban (ELIQUIS) 2.5 MG TABS tablet, Take 1 tablet (2.5 mg total) by mouth 2 (two) times daily., Disp: 60 tablet, Rfl: 1 .  atorvastatin (LIPITOR) 20 MG tablet, Take 20 mg by mouth daily., Disp: , Rfl:  .  donepezil (ARICEPT) 10 MG tablet, Take 10 mg by mouth at bedtime., Disp: , Rfl:  .  dorzolamide-timolol (COSOPT) 22.3-6.8 MG/ML ophthalmic solution, Place 1 drop into both eyes 2 (two) times daily. , Disp: , Rfl:  .  folic acid (FOLVITE) 1 MG tablet, Take 1 mg by mouth daily., Disp: , Rfl:  .  latanoprost (XALATAN) 0.005 % ophthalmic solution, Place 1 drop into both eyes at bedtime. , Disp: , Rfl:  .  lidocaine-prilocaine (EMLA) cream, Apply 1 application topically as needed. Apply small amount to port site approx 1-2 hours prior to appointment., Disp: 30 g, Rfl: 1 .  Magnesium Chloride (MAG64) 64 MG TBEC, TAKE 1 TABLET (64 MG TOTAL) BY MOUTH 2 (TWO) TIMES DAILY., Disp: 180 tablet, Rfl: 0 .  megestrol (MEGACE) 40 MG tablet, TAKE 1 TABLET BY MOUTH  TWICE A DAY, Disp: 60 tablet, Rfl: 0 .  Multiple Vitamins-Minerals (CENTRUM ADULTS PO), Take by mouth., Disp: , Rfl:  .  oxyCODONE (OXY IR/ROXICODONE) 5 MG immediate release tablet, Take 1 tablet (5 mg total) by mouth every 4 (four) hours as needed for severe pain., Disp: 90 tablet, Rfl: 0 .  Thiamine HCl (VITAMIN B-1 PO), Take 100 mg by mouth daily., Disp: , Rfl:  No current facility-administered medications for this visit.  Facility-Administered Medications Ordered in Other Visits:  .  sodium chloride flush (NS) 0.9 % injection 10 mL, 10 mL, Intravenous, PRN, Earlie Server, MD, 10 mL at 03/01/20 4332  Physical exam:  Vitals:   07/10/20 1138  BP: (!) 147/87  Pulse: 79  Resp: 18  Temp: 99.1 F (37.3 C)  TempSrc: Tympanic  SpO2: 98%   Physical Exam Constitutional:  Comments: Thin build  Cardiovascular:     Rate and Rhythm: Normal rate and regular rhythm.  Pulmonary:     Effort: Pulmonary effort is normal.  Abdominal:     General: There is no distension.     Palpations: Abdomen is soft.     Tenderness: There is no abdominal tenderness.  Skin:    General: Skin is warm and dry.  Neurological:     Mental Status: He is alert.  Psychiatric:        Attention and Perception: Attention normal.        Mood and Affect: Mood normal.        Behavior: Behavior is cooperative.        Cognition and Memory: Cognition is impaired. Memory is impaired.      CMP Latest Ref Rng & Units 07/05/2020  Glucose 70 - 99 mg/dL 92  BUN 8 - 23 mg/dL 15  Creatinine 0.61 - 1.24 mg/dL 0.98  Sodium 135 - 145 mmol/L 138  Potassium 3.5 - 5.1 mmol/L 3.9  Chloride 98 - 111 mmol/L 103  CO2 22 - 32 mmol/L 23  Calcium 8.9 - 10.3 mg/dL 9.5  Total Protein 6.5 - 8.1 g/dL 7.7  Total Bilirubin 0.3 - 1.2 mg/dL 1.2  Alkaline Phos 38 - 126 U/L 68  AST 15 - 41 U/L 21  ALT 0 - 44 U/L 18   CBC Latest Ref Rng & Units 07/05/2020  WBC 4.0 - 10.5 K/uL 6.1  Hemoglobin 13.0 - 17.0 g/dL 11.9(L)  Hematocrit 39.0 - 52.0 %  37.2(L)  Platelets 150 - 400 K/uL 195    No images are attached to the encounter.  CT SOFT TISSUE NECK W CONTRAST  Result Date: 06/29/2020 CLINICAL DATA:  Current head neck cancer. 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 Worsening hoarseness. EXAM: CT NECK WITH CONTRAST TECHNIQUE: Multidetector CT imaging of the neck was performed using the standard protocol following the bolus administration of intravenous contrast. CONTRAST:  55mL OMNIPAQUE IOHEXOL 300 MG/ML  SOLN COMPARISON:  CT neck 03/31/2020 FINDINGS: Pharynx and larynx: Progressive thickening of the vocal cords bilaterally. This shows irregular enhancement and could be due to tumor versus radiation change. Direct visualization is recommended. Epiglottis normal. Oropharynx negative for recurrent tumor. Salivary glands: Parotid normal bilaterally. Atrophic submandibular gland bilaterally without acute abnormality. Thyroid: Negative Lymph nodes: 17 mm lymph node in the right mid neck at the level of the hyoid bone is new since the prior study. This has irregular enhancement and is consistent with metastatic disease. This is compressing the right jugular vein with thrombus above and below the tumor Cystic retropharyngeal lymph node on the right 14 mm, unchanged from the prior study. Vascular: Thrombus in the right jugular vein due to compression by malignant lymph node in the right mid neck. Severe atherosclerotic disease in the carotid artery bilaterally which remain patent bilaterally. Atherosclerotic calcification aortic arch. Limited intracranial: Negative Visualized orbits: Chronic fracture right medial orbit unchanged. Mastoids and visualized paranasal sinuses: Negative Skeleton: Lytic destructive lesion T2 vertebral body on the right with mild pathologic fracture unchanged from the prior study. Metastatic disease is left scapula with pathologic fracture. Fracture not seen on prior studies. Upper chest: Lung apices clear  bilaterally. Port-A-Cath tip right jugular vein. Other: None IMPRESSION: 1. 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 This is compressing the right jugular vein with thrombus in the vein above and below the lymph node. Cystic retropharyngeal lymph  node on the right is stable 2. Progressive soft tissue thickening in the larynx bilaterally suspicious for tumor. Recommend direct visualization to differentiate from radiation change. 3. Metastatic disease at T2 is unchanged 4. Metastatic deposit left scapula. There is now a pathologic fracture of the scapula. Electronically Signed   By: Franchot Gallo M.D.   On: 06/29/2020 16:37    Assessment and plan- Patient is a 80 y.o. male who presents to Symptom Management Clinic for  1. Acid Reflux- suspect hiccups and belching related to underlying acid reflux. Recommend daily nexium otc.   2. Dementia- currently on aricept. Will refer to neurology to establish care.   Patient advised to notify the clinic if there is no improvement in symptoms or if symptoms worsen. Follow up with radiation oncology and medical oncology as scheduled.    Visit Diagnosis 1. Gastroesophageal reflux disease, unspecified whether esophagitis present   2. 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 He also understands that He can call clinic at any time with any questions, concerns, or complaints.   Thank you for allowing me to participate in the care of this very pleasant patient.   Beckey Rutter, DNP, AGNP-C [place] at [place]

## 2020-07-10 NOTE — Progress Notes (Signed)
Pt reports abdominal pain and intermittent hiccups for about 2 weeks now. He states that the pain has improved some. States that symptoms do not prevent him from eating and that symptoms usually happen between meals. Denies nausea, vomiting, or diarrhea.

## 2020-07-12 ENCOUNTER — Ambulatory Visit
Admission: RE | Admit: 2020-07-12 | Discharge: 2020-07-12 | Disposition: A | Discharge status: Home / Self Care | Payer: Medicare Other | Source: Ambulatory Visit | Attending: Radiation Oncology | Admitting: Radiation Oncology

## 2020-07-12 ENCOUNTER — Telehealth: Payer: Self-pay

## 2020-07-12 ENCOUNTER — Inpatient Hospital Stay: Payer: Medicare Other

## 2020-07-12 DIAGNOSIS — C139 Malignant neoplasm of hypopharynx, unspecified: Secondary | ICD-10-CM | POA: Insufficient documentation

## 2020-07-12 DIAGNOSIS — C01 Malignant neoplasm of base of tongue: Secondary | ICD-10-CM | POA: Diagnosis present

## 2020-07-12 NOTE — Telephone Encounter (Signed)
Neurology referral successfully faxed to [facility]. See under media tab.

## 2020-07-14 ENCOUNTER — Encounter: Payer: Self-pay | Admitting: Oncology

## 2020-07-14 ENCOUNTER — Other Ambulatory Visit: Payer: Self-pay | Admitting: Oncology

## 2020-07-14 ENCOUNTER — Other Ambulatory Visit: Payer: Self-pay | Admitting: *Deleted

## 2020-07-14 DIAGNOSIS — C329 Malignant neoplasm of larynx, unspecified: Secondary | ICD-10-CM | POA: Insufficient documentation

## 2020-07-14 DIAGNOSIS — C109 Malignant neoplasm of oropharynx, unspecified: Secondary | ICD-10-CM

## 2020-07-14 HISTORY — DX: Malignant neoplasm of larynx, unspecified: C32.9

## 2020-07-14 MED ORDER — DEXAMETHASONE 4 MG PO TABS: 8.0000 mg | ORAL_TABLET | Freq: Every day | ORAL | 1 refills | Status: DC

## 2020-07-14 NOTE — Progress Notes (Signed)
DISCONTINUE ON PATHWAY REGIMEN - Head and Neck     A cycle is every 21 days:     Cisplatin      Fluorouracil      Pembrolizumab   **Always confirm dose/schedule in your pharmacy ordering system**  REASON: Other Reason PRIOR TREATMENT: [id number]: Pembrolizumab 200 mg D1 + Cisplatin 100 mg/m2 D1 + Fluorouracil 1,000 mg/m2/day CIV D1-4 q21 Days x 6 Cycles, then Pembrolizumab 200 mg q21 Days for up to a total of 24 Months TREATMENT RESPONSE: Unable to Evaluate  START OFF PATHWAY REGIMEN - Head and Neck   [id number]:Cisplatin 40 mg/m2 IV D1 q7 Days + RT:   A cycle is every 7 days:     Cisplatin   **Always confirm dose/schedule in your pharmacy ordering system**  Patient Characteristics: Larynx, Contradictory Staging Disease Classification: Larynx AJCC T Category: TX AJCC 8 Stage Grouping: IVC Therapeutic Status: Local Recurrence AJCC N Category: NX AJCC M Category: M1 Intent of Therapy: Non-Curative / Palliative Intent, Discussed with Patient

## 2020-07-17 ENCOUNTER — Telehealth: Payer: Self-pay | Admitting: *Deleted

## 2020-07-17 ENCOUNTER — Telehealth: Payer: Self-pay

## 2020-07-17 NOTE — Telephone Encounter (Signed)
-----   Message from Earlie Server, MD sent at 07/14/2020  4:13 PM EDT ----- Please schedule him  lab md 5/31 or 6/1 cisplatin- he had same chemo before. Talked to both patient and wife and they agree with plan.

## 2020-07-17 NOTE — Telephone Encounter (Signed)
Did she specify which medication?

## 2020-07-17 NOTE — Telephone Encounter (Signed)
Pt is aware of appts for 6/1 that were made. When I contacted the patient, his wife stated she had questions regarding dexamethsone. She is confused with the instructions that were attached to the Rx. She would like a call back to discuss it. If you would please reiterate pt's appt time on the 6/1.

## 2020-07-17 NOTE — Telephone Encounter (Signed)
Gregory Burton called stating she got  Medicine that was ordered and is asking when he is to start taking it. Please return her call

## 2020-07-17 NOTE — Telephone Encounter (Signed)
Spoke to Mrs. [name] and gave her instructions on dexamethasone per instructions in Epic:   Sig - Route: Take 2 tablets (8 mg total) by mouth daily. Take daily x 3 days starting the day after cisplatin chemotherapy. Take with food.  I told her that pt will not take medication until he talks to [name]. She voiced understanding. Also gave her appts details on first chemo appt. She will come with pt to his first radiation appt on 5/26 and will pick up an updated AVS.

## 2020-07-17 NOTE — Telephone Encounter (Signed)
Please schedule as MD recommends and inform patient of appt details.

## 2020-07-18 DIAGNOSIS — C01 Malignant neoplasm of base of tongue: Secondary | ICD-10-CM | POA: Diagnosis not present

## 2020-07-20 ENCOUNTER — Ambulatory Visit: Admission: RE | Admit: 2020-07-20 | Payer: Medicare Other | Source: Ambulatory Visit

## 2020-07-25 ENCOUNTER — Ambulatory Visit
Admission: RE | Admit: 2020-07-25 | Discharge: 2020-07-25 | Disposition: A | Discharge status: Home / Self Care | Payer: Medicare Other | Source: Ambulatory Visit | Attending: Radiation Oncology | Admitting: Radiation Oncology

## 2020-07-25 ENCOUNTER — Inpatient Hospital Stay: Payer: Medicare Other | Attending: Oncology

## 2020-07-25 DIAGNOSIS — C01 Malignant neoplasm of base of tongue: Secondary | ICD-10-CM | POA: Diagnosis not present

## 2020-07-26 ENCOUNTER — Inpatient Hospital Stay (HOSPITAL_BASED_OUTPATIENT_CLINIC_OR_DEPARTMENT_OTHER): Payer: Medicare Other | Admitting: Oncology

## 2020-07-26 ENCOUNTER — Inpatient Hospital Stay: Payer: Medicare Other

## 2020-07-26 ENCOUNTER — Other Ambulatory Visit: Payer: Medicare Other

## 2020-07-26 ENCOUNTER — Encounter: Payer: Self-pay | Admitting: Oncology

## 2020-07-26 ENCOUNTER — Ambulatory Visit
Admission: RE | Admit: 2020-07-26 | Discharge: 2020-07-26 | Disposition: A | Discharge status: Home / Self Care | Payer: Medicare Other | Source: Ambulatory Visit | Attending: Radiation Oncology | Admitting: Radiation Oncology

## 2020-07-26 ENCOUNTER — Ambulatory Visit: Payer: Medicare Other | Admitting: Oncology

## 2020-07-26 ENCOUNTER — Other Ambulatory Visit: Payer: Self-pay

## 2020-07-26 VITALS — BP 147/88 | HR 66 | Temp 97.2°F | Resp 16 | Wt 109.6 lb

## 2020-07-26 DIAGNOSIS — Z95828 Presence of other vascular implants and grafts: Secondary | ICD-10-CM

## 2020-07-26 DIAGNOSIS — C109 Malignant neoplasm of oropharynx, unspecified: Secondary | ICD-10-CM

## 2020-07-26 DIAGNOSIS — C139 Malignant neoplasm of hypopharynx, unspecified: Secondary | ICD-10-CM | POA: Insufficient documentation

## 2020-07-26 DIAGNOSIS — C01 Malignant neoplasm of base of tongue: Secondary | ICD-10-CM | POA: Insufficient documentation

## 2020-07-26 DIAGNOSIS — Z5111 Encounter for antineoplastic chemotherapy: Secondary | ICD-10-CM

## 2020-07-26 DIAGNOSIS — F1027 Alcohol dependence with alcohol-induced persisting dementia: Secondary | ICD-10-CM

## 2020-07-26 DIAGNOSIS — C7951 Secondary malignant neoplasm of bone: Secondary | ICD-10-CM | POA: Insufficient documentation

## 2020-07-26 DIAGNOSIS — C329 Malignant neoplasm of larynx, unspecified: Secondary | ICD-10-CM | POA: Diagnosis not present

## 2020-07-26 DIAGNOSIS — C3492 Malignant neoplasm of unspecified part of left bronchus or lung: Secondary | ICD-10-CM | POA: Diagnosis not present

## 2020-07-26 DIAGNOSIS — R634 Abnormal weight loss: Secondary | ICD-10-CM | POA: Insufficient documentation

## 2020-07-26 DIAGNOSIS — Z86718 Personal history of other venous thrombosis and embolism: Secondary | ICD-10-CM | POA: Insufficient documentation

## 2020-07-26 DIAGNOSIS — Z9221 Personal history of antineoplastic chemotherapy: Secondary | ICD-10-CM | POA: Insufficient documentation

## 2020-07-26 DIAGNOSIS — R1310 Dysphagia, unspecified: Secondary | ICD-10-CM | POA: Insufficient documentation

## 2020-07-26 DIAGNOSIS — Z85118 Personal history of other malignant neoplasm of bronchus and lung: Secondary | ICD-10-CM | POA: Insufficient documentation

## 2020-07-26 DIAGNOSIS — Z7901 Long term (current) use of anticoagulants: Secondary | ICD-10-CM | POA: Insufficient documentation

## 2020-07-26 DIAGNOSIS — Z79899 Other long term (current) drug therapy: Secondary | ICD-10-CM | POA: Insufficient documentation

## 2020-07-26 DIAGNOSIS — Z923 Personal history of irradiation: Secondary | ICD-10-CM | POA: Insufficient documentation

## 2020-07-26 LAB — COMPREHENSIVE METABOLIC PANEL
ALT: 18 U/L (ref 0–44)
AST: 19 U/L (ref 15–41)
Albumin: 4.1 g/dL (ref 3.5–5.0)
Alkaline Phosphatase: 61 U/L (ref 38–126)
Anion gap: 9 (ref 5–15)
BUN: 16 mg/dL (ref 8–23)
CO2: 24 mmol/L (ref 22–32)
Calcium: 9.6 mg/dL (ref 8.9–10.3)
Chloride: 106 mmol/L (ref 98–111)
Creatinine, Ser: 0.84 mg/dL (ref 0.61–1.24)
GFR, Estimated: >60 mL/min (ref >60)
Glucose, Bld: 114 mg/dL — ABNORMAL HIGH (ref 70–99)
Potassium: 3.9 mmol/L (ref 3.5–5.1)
Sodium: 139 mmol/L (ref 135–145)
Total Bilirubin: 1.2 mg/dL (ref 0.3–1.2)
Total Protein: 7.4 g/dL (ref 6.5–8.1)

## 2020-07-26 LAB — CBC WITH DIFFERENTIAL/PLATELET
Abs Immature Granulocytes: 0.02 10*3/uL (ref 0.00–0.07)
Basophils Absolute: 0 10*3/uL (ref 0.0–0.1)
Basophils Relative: 0 %
Eosinophils Absolute: 0.1 10*3/uL (ref 0.0–0.5)
Eosinophils Relative: 1 %
HCT: 35.5 % — ABNORMAL LOW (ref 39.0–52.0)
Hemoglobin: 11.5 g/dL — ABNORMAL LOW (ref 13.0–17.0)
Immature Granulocytes: 0 %
Lymphocytes Relative: 15 %
Lymphs Abs: 0.9 10*3/uL (ref 0.7–4.0)
MCH: 28.3 pg (ref 26.0–34.0)
MCHC: 32.4 g/dL (ref 30.0–36.0)
MCV: 87.2 fL (ref 80.0–100.0)
Monocytes Absolute: 0.7 10*3/uL (ref 0.1–1.0)
Monocytes Relative: 10 %
Neutro Abs: 4.6 10*3/uL (ref 1.7–7.7)
Neutrophils Relative %: 74 %
Platelets: 219 10*3/uL (ref 150–400)
RBC: 4.07 MIL/uL — ABNORMAL LOW (ref 4.22–5.81)
RDW: 14.9 % (ref 11.5–15.5)
WBC: 6.3 10*3/uL (ref 4.0–10.5)
nRBC: 0 % (ref 0.0–0.2)

## 2020-07-26 LAB — MAGNESIUM: Magnesium: 1.7 mg/dL (ref 1.7–2.4)

## 2020-07-26 MED ORDER — MAGNESIUM SULFATE 2 GM/50ML IV SOLN
2.0000 g | Freq: Once | INTRAVENOUS | Status: AC
Start: 2020-07-26 — End: 2020-07-26
  Administered 2020-07-26: 2 g via INTRAVENOUS
  Filled 2020-07-26: qty 50

## 2020-07-26 MED ORDER — HEPARIN SOD (PORK) LOCK FLUSH 100 UNIT/ML IV SOLN
500.0000 [IU] | Freq: Once | INTRAVENOUS | Status: DC | PRN
  Filled 2020-07-26: qty 5

## 2020-07-26 MED ORDER — SODIUM CHLORIDE 0.9 % IV SOLN
Freq: Once | INTRAVENOUS | Status: AC
  Filled 2020-07-26: qty 250

## 2020-07-26 MED ORDER — SODIUM CHLORIDE 0.9% FLUSH
10.0000 mL | Freq: Once | INTRAVENOUS | Status: AC
  Administered 2020-07-26: 10 mL via INTRAVENOUS
  Filled 2020-07-26: qty 10

## 2020-07-26 MED ORDER — HEPARIN SOD (PORK) LOCK FLUSH 100 UNIT/ML IV SOLN
500.0000 [IU] | Freq: Once | INTRAVENOUS | Status: AC
  Administered 2020-07-26: 500 [IU] via INTRAVENOUS
  Filled 2020-07-26: qty 5

## 2020-07-26 MED ORDER — PALONOSETRON HCL INJECTION 0.25 MG/5ML
0.2500 mg | Freq: Once | INTRAVENOUS | Status: AC
  Administered 2020-07-26: 0.25 mg via INTRAVENOUS
  Filled 2020-07-26: qty 5

## 2020-07-26 MED ORDER — SODIUM CHLORIDE 0.9 % IV SOLN
30.0000 mg/m2 | Freq: Once | INTRAVENOUS | Status: AC
  Administered 2020-07-26: 48 mg via INTRAVENOUS
  Filled 2020-07-26: qty 48

## 2020-07-26 MED ORDER — SODIUM CHLORIDE 0.9 % IV SOLN
Freq: Once | INTRAVENOUS | Status: AC
Start: 2020-07-26 — End: 2020-07-26
  Filled 2020-07-26: qty 250

## 2020-07-26 MED ORDER — SODIUM CHLORIDE 0.9 % IV SOLN
10.0000 mg | Freq: Once | INTRAVENOUS | Status: AC
  Administered 2020-07-26: 10 mg via INTRAVENOUS
  Filled 2020-07-26: qty 10

## 2020-07-26 MED ORDER — SODIUM CHLORIDE 0.9 % IV SOLN: Freq: Once | INTRAVENOUS | Status: DC

## 2020-07-26 MED ORDER — HEPARIN SOD (PORK) LOCK FLUSH 100 UNIT/ML IV SOLN
INTRAVENOUS | Status: AC
  Filled 2020-07-26: qty 5

## 2020-07-26 MED ORDER — POTASSIUM CHLORIDE IN NACL 20-0.9 MEQ/L-% IV SOLN
Freq: Once | INTRAVENOUS | Status: AC
  Filled 2020-07-26: qty 1000

## 2020-07-26 MED ORDER — SODIUM CHLORIDE 0.9 % IV SOLN
150.0000 mg | Freq: Once | INTRAVENOUS | Status: AC
  Administered 2020-07-26: 150 mg via INTRAVENOUS
  Filled 2020-07-26: qty 150

## 2020-07-26 NOTE — Patient Instructions (Signed)
[facility]  Discharge Instructions: Thank you for choosing [facility] to provide your oncology and hematology care.  If you have a lab appointment with the [facility], please go directly to the [facility] and check in at the registration area.  Wear comfortable clothing and clothing appropriate for easy access to any Portacath or PICC line.   We strive to give you quality time with your provider. You may need to reschedule your appointment if you arrive late (15 or more minutes).  Arriving late affects you and other patients whose appointments are after yours.  Also, if you miss three or more appointments without notifying the office, you may be dismissed from the clinic at the provider's discretion.      For prescription refill requests, have your pharmacy contact our office and allow 72 hours for refills to be completed.    Today you received the following chemotherapy and/or immunotherapy agents Cisplatin       To help prevent nausea and vomiting after your treatment, we encourage you to take your nausea medication as directed.  BELOW ARE SYMPTOMS THAT SHOULD BE REPORTED IMMEDIATELY: . *FEVER GREATER THAN 100.4 F (38 C) OR HIGHER . *CHILLS OR SWEATING . *NAUSEA AND VOMITING THAT IS NOT CONTROLLED WITH YOUR NAUSEA MEDICATION . *UNUSUAL SHORTNESS OF BREATH . *UNUSUAL BRUISING OR BLEEDING . *URINARY PROBLEMS (pain or burning when urinating, or frequent urination) . *BOWEL PROBLEMS (unusual diarrhea, constipation, pain near the anus) . TENDERNESS IN MOUTH AND THROAT WITH OR WITHOUT PRESENCE OF ULCERS (sore throat, sores in mouth, or a toothache) . UNUSUAL RASH, SWELLING OR PAIN  . UNUSUAL VAGINAL DISCHARGE OR ITCHING   Items with * indicate a potential emergency and should be followed up as soon as possible or go to the Emergency Department if any problems should occur.  Please show the CHEMOTHERAPY ALERT CARD or IMMUNOTHERAPY  ALERT CARD at check-in to the Emergency Department and triage nurse.  Should you have questions after your visit or need to cancel or reschedule your appointment, please contact [facility]  [phone] and follow the prompts.  Office hours are 8:00 a.m. to 4:30 p.m. Monday - Friday. Please note that voicemails left after 4:00 p.m. may not be returned until the following business day.  We are closed weekends and major holidays. You have access to a nurse at all times for urgent questions. Please call the main number to the clinic [phone] and follow the prompts.  For any non-urgent questions, you may also contact your provider using MyChart. We now offer e-Visits for anyone 45 and older to request care online for non-urgent symptoms. For details visit [email or web address].   Also download the MyChart app! Go to the app store, search "MyChart", open the app, select [facility], and log in with your MyChart username and password.  Due to Covid, a mask is required upon entering the hospital/clinic. If you do not have a mask, one will be given to you upon arrival. For doctor visits, patients may have 1 support person aged 16 or older with them. For treatment visits, patients cannot have anyone with them due to current Covid guidelines and our immunocompromised population.    Cisplatin injection What is this medicine? CISPLATIN (SIS pla tin) is a chemotherapy drug. It targets fast dividing cells, like cancer cells, and causes these cells to die. This medicine is used to treat many types of cancer like bladder, ovarian, and testicular  cancers. This medicine may be used for other purposes; ask your health care provider or pharmacist if you have questions. COMMON BRAND NAME(S): Platinol, Platinol -AQ What should I tell my health care provider before I take this medicine? They need to know if you have any of these conditions:  eye disease, vision  problems  hearing problems  kidney disease  low blood counts, like white cells, platelets, or red blood cells  tingling of the fingers or toes, or other nerve disorder  an unusual or allergic reaction to cisplatin, carboplatin, oxaliplatin, other medicines, foods, dyes, or preservatives  pregnant or trying to get pregnant  breast-feeding How should I use this medicine? This drug is given as an infusion into a vein. It is administered in a hospital or clinic by a specially trained health care professional. Talk to your pediatrician regarding the use of this medicine in children. Special care may be needed. Overdosage: If you think you have taken too much of this medicine contact a poison control center or emergency room at once. NOTE: This medicine is only for you. Do not share this medicine with others. What if I miss a dose? It is important not to miss a dose. Call your doctor or health care professional if you are unable to keep an appointment. What may interact with this medicine? This medicine may interact with the following medications:  foscarnet  certain antibiotics like amikacin, gentamicin, neomycin, polymyxin B, streptomycin, tobramycin, vancomycin This list may not describe all possible interactions. Give your health care provider a list of all the medicines, herbs, non-prescription drugs, or dietary supplements you use. Also tell them if you smoke, drink alcohol, or use illegal drugs. Some items may interact with your medicine. What should I watch for while using this medicine? Your condition will be monitored carefully while you are receiving this medicine. You will need important blood work done while you are taking this medicine. This drug may make you feel generally unwell. This is not uncommon, as chemotherapy can affect healthy cells as well as cancer cells. Report any side effects. Continue your course of treatment even though you feel ill unless your doctor tells  you to stop. This medicine may increase your risk of getting an infection. Call your healthcare professional for advice if you get a fever, chills, or sore throat, or other symptoms of a cold or flu. Do not treat yourself. Try to avoid being around people who are sick. Avoid taking medicines that contain aspirin, acetaminophen, ibuprofen, naproxen, or ketoprofen unless instructed by your healthcare professional. These medicines may hide a fever. This medicine may increase your risk to bruise or bleed. Call your doctor or health care professional if you notice any unusual bleeding. Be careful brushing and flossing your teeth or using a toothpick because you may get an infection or bleed more easily. If you have any dental work done, tell your dentist you are receiving this medicine. Do not become pregnant while taking this medicine or for 14 months after stopping it. Women should inform their healthcare professional if they wish to become pregnant or think they might be pregnant. Men should not father a child while taking this medicine and for 11 months after stopping it. There is potential for serious side effects to an unborn child. Talk to your healthcare professional for more information. Do not breast-feed an infant while taking this medicine. This medicine has caused ovarian failure in some women. This medicine may make it more difficult to get  pregnant. Talk to your healthcare professional if you are concerned about your fertility. This medicine has caused decreased sperm counts in some men. This may make it more difficult to father a child. Talk to your healthcare professional if you are concerned about your fertility. Drink fluids as directed while you are taking this medicine. This will help protect your kidneys. Call your doctor or health care professional if you get diarrhea. Do not treat yourself. What side effects may I notice from receiving this medicine? Side effects that you should report  to your doctor or health care professional as soon as possible:  allergic reactions like skin rash, itching or hives, swelling of the face, lips, or tongue  blurred vision  changes in vision  decreased hearing or ringing of the ears  nausea, vomiting  pain, redness, or irritation at site where injected  pain, tingling, numbness in the hands or feet  signs and symptoms of bleeding such as bloody or black, tarry stools; red or dark brown urine; spitting up blood or brown material that looks like coffee grounds; red spots on the skin; unusual bruising or bleeding from the eyes, gums, or nose  signs and symptoms of infection like fever; chills; cough; sore throat; pain or trouble passing urine  signs and symptoms of kidney injury like trouble passing urine or change in the amount of urine  signs and symptoms of low red blood cells or anemia such as unusually weak or tired; feeling faint or lightheaded; falls; breathing problems Side effects that usually do not require medical attention (report to your doctor or health care professional if they continue or are bothersome):  loss of appetite  mouth sores  muscle cramps This list may not describe all possible side effects. Call your doctor for medical advice about side effects. You may report side effects to FDA at 1-800-FDA-1088. Where should I keep my medicine? This drug is given in a hospital or clinic and will not be stored at home. NOTE: This sheet is a summary. It may not cover all possible information. If you have questions about this medicine, talk to your doctor, pharmacist, or health care provider.  2021 [employer] (2018-02-06 15:59:17)

## 2020-07-26 NOTE — Progress Notes (Signed)
Hematology/Oncology progress note [facility] [phone] [phone]   Patient Care Team: [name], [name], MD as PCP - General (Internal Medicine) Ketih Goodie, MD as Consulting Physician (Oncology)  REFERRING PROVIDER: [name], [name], [name]  HISTORY OF PRESENTING ILLNESS:  Gregory Burton is a  80 y.o.  male with PMH listed below was seen in consultation at the request of  [name], [name], MD  for evaluation of lymph node.  Patient moved from [place] to [place] for about 3 weeks. He has noticed a neck knot on the right side of his neck. The knot is sore and makes him to cough and he feels it when he swallows. No swallowing difficulty.  Patient was accompanied by his wife. She reports that patient's previous PCP has done neck sonogram for evaluation.  Patient also had right lower molar tooth extraction done a few weeks before he noticed  He also took a course of antibiotics.  Due to his tooth pain, his appetite has decreased and he has lost weight loss, about 20 pounds, he can not specify the time frame.   Alcoholism History of prostate cancer. S/p Radiation/seed, he follows up with [facility].   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  Interval decrease of the posterior left upper lobe pleural-based nodule  with interval decrease in size of the tiny adjacent satellite nodule.  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  No recurrent tumor in right piriform sinus.  Right lateral pharyngeal lymph node is stable.  No new or recurrent adenopathy.Suspect metastasis.  I recommend patient to obtain PET scan restaging.  I discussed about the plan of re-biopsy of either the paraspinal soft tissue mass versus other more feasible hypermetabolic sites detected on PET scan. Patient was seen by [name] and opted to proceed with palliative radiation as soon as possible.  PET scan cannot be arranged prior to the radiation so the PET scan was canceled. 04/20/2020 -04/25/2020, palliative radiation to thoracic spine  05/10/2020, MRI thoracic spine was obtained for worsening of back pain and left shoulder pain.  Images showed metastatic disease throughout almost the entire T2 with and associated mild superior endplate compression fracture.  Abnormal signal in the anterior, inferior endplate of T1, superior aspect of the T3.  Small metastatic deposit in T12 is also noted. Case was also discussed on multidisciplinary tumor board on 05/25/2020 05/22/2020, PET scan showed new hypermetabolic lytic bone metastasis in the left scapular near the glenoid and in the T2 vertebral body.  Persistent hypermetabolic right lateral retropharyngeal and right level 3 neck lymph node metastasis.  Peripheral left upper lobe pulmonary nodule is mildly decreased with new surrounding mild platelike postradiation changes..  No residual recurrent neoplasm at the site of vomiting in the right piriform sinus  06/07/2020 -06/09/2020 patient underwent palliative radiation to left scapular April 2022 seen by [name]   and flexible laryngoscope examination showed new lesion at the right false vocal cord.  Case was discussed on tumor board on 06/22/2020. [name] recommend additional CT soft tissue neck with contrast for additional  evaluation. 06/29/2020 CT images were reviewed and discussed with patient. There is new malignant lymph node in the right mid neck at the level of the hyoid, this compressing the right jugular vein with thrombus in the vein above and below the lymph node.  Cystic retropharyngeal lymph node on the right is stable.  Progressive soft tissue thickening in the larynx bilaterally suspicious for tumor.  Metastatic disease at the T2 is unchanged.  Metastatic deposit left scapula there is now a pathological fracture of the scapular. CT scan findings were discussed with ENT [name] via secure chat.  [name] feels surgery is not feasible.  The larynx lesion currently does not compromise his airway however further progression may in the future  07/10/2020 Seen by Radonc [name] who would offer salvage radiation to his larynx as well as neck adenopathy. Would like low dose chemotherapy as sensitizer.    Patient was also referred to establish care with Dr. Georgiann Cocker at [facility] for kyphoplasty.  Patient's wife reports that they were never contacted and she called Dr. Georgiann Cocker office with no success.   NGS.  PD-L1-IHC 40, no targetable mutations.   INTERVAL HISTORY Gregory Burton is a 80 y.o. male who has above history reviewed by me today presents for follow-up of head and neck cancer and lung cancer Patient was accompanied by wife. Patient's son was also called and able to participate in discussion. No new complaints. Weight is stable.  Review of Systems  Constitutional: Positive for fatigue. Negative for appetite change, chills, fever and unexpected weight change.  HENT:   Positive for hearing loss and voice change.   Eyes: Negative for eye problems and icterus.  Respiratory: Negative for chest tightness, cough and shortness of breath.   Cardiovascular: Negative for chest pain and leg swelling.  Gastrointestinal: Negative for abdominal distention, abdominal pain, blood in stool and nausea.   Endocrine: Negative for hot flashes.  Genitourinary: Negative for difficulty urinating, dysuria and frequency.   Musculoskeletal: Positive for arthralgias.  Skin: Negative for itching and rash.  Neurological: Negative for extremity weakness, light-headedness and numbness.  Hematological: Negative for adenopathy. Does not bruise/bleed easily.  Psychiatric/Behavioral: Negative for confusion.       Forgetful    MEDICAL HISTORY:  Past Medical History:  Diagnosis Date  . Alcohol abuse   . Blood transfusion without reported diagnosis 2013  . Cataract   . Dementia ([place])   . Glaucoma   . Gout   . Hypercholesteremia   . Hypertension   . 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 Prostate CA ([place]) 2008  . Squamous cell lung cancer ([place]) 06/23/2019    SURGICAL HISTORY: Past Surgical History:  Procedure Laterality Date  . BRAIN SURGERY  2012  . HERNIA REPAIR    . PORTA CATH INSERTION N/A 07/27/2019   Procedure: PORTA CATH INSERTION;  Surgeon: Katha Cabal, MD;  Location: [facility];  Service: Cardiovascular;  Laterality: N/A;    SOCIAL HISTORY: Social History   Socioeconomic History  . Marital status: Married    Spouse name: Not on file  . Number of children: Not on file  . Years of education: Not on file  . Highest education level: Not on file  Occupational History  . Not on  file  Tobacco Use  . Smoking status: Former Smoker    Years: 21.00    Types: Cigarettes    Quit date: 05/11/2008    Years since quitting: 12.2  . Smokeless tobacco: Never Used  Vaping Use  . Vaping Use: Never used  Substance and Sexual Activity  . Alcohol use: Yes    Comment: 0.5 pint liquor a week  . Drug use: Never  . Sexual activity: Not Currently  Other Topics Concern  . Not on file  Social History Narrative   Lives at home with wife. Wife states he has some dementia but is able to sign his own consent.   Social Determinants of Health   Financial  Resource Strain: Not on file  Food Insecurity: Not on file  Transportation Needs: Not on file  Physical Activity: Not on file  Stress: Not on file  Social Connections: Not on file  Intimate Partner Violence: Not on file    FAMILY HISTORY: History reviewed. No pertinent family history.  ALLERGIES:  is allergic to enalapril.  MEDICATIONS:  Current Outpatient Medications  Medication Sig Dispense Refill  . apixaban (ELIQUIS) 2.5 MG TABS tablet Take 1 tablet (2.5 mg total) by mouth 2 (two) times daily. 60 tablet 1  . atorvastatin (LIPITOR) 20 MG tablet Take 20 mg by mouth daily.    Marland Kitchen dexamethasone (DECADRON) 4 MG tablet Take 2 tablets (8 mg total) by mouth daily. Take daily x 3 days starting the day after cisplatin chemotherapy. Take with food. 30 tablet 1  . donepezil (ARICEPT) 10 MG tablet Take 10 mg by mouth at bedtime.    . dorzolamide-timolol (COSOPT) 22.3-6.8 MG/ML ophthalmic solution Place 1 drop into both eyes 2 (two) times daily.     . folic acid (FOLVITE) 1 MG tablet Take 1 mg by mouth daily.    Marland Kitchen latanoprost (XALATAN) 0.005 % ophthalmic solution Place 1 drop into both eyes at bedtime.     . lidocaine-prilocaine (EMLA) cream Apply 1 application topically as needed. Apply small amount to port site approx 1-2 hours prior to appointment. 30 g 1  . MAG64 64 MG TBEC TAKE 1 TABLET (64 MG TOTAL) BY MOUTH 2 (TWO) TIMES DAILY. 180 tablet 0  . megestrol (MEGACE) 40 MG tablet TAKE 1 TABLET BY MOUTH TWICE A DAY 60 tablet 0  . Multiple Vitamins-Minerals (CENTRUM ADULTS PO) Take by mouth.    . oxyCODONE (OXY IR/ROXICODONE) 5 MG immediate release tablet Take 1 tablet (5 mg total) by mouth every 4 (four) hours as needed for severe pain. 90 tablet 0  . Thiamine HCl (VITAMIN B-1 PO) Take 100 mg by mouth daily.     No current facility-administered medications for this visit.   Facility-Administered Medications Ordered in Other Visits  Medication Dose Route Frequency Provider Last Rate Last Admin   . heparin lock flush 100 unit/mL  500 Units Intravenous Once Earlie Server, MD      . sodium chloride flush (NS) 0.9 % injection 10 mL  10 mL Intravenous PRN Earlie Server, MD   10 mL at 03/01/20 0812     PHYSICAL EXAMINATION: ECOG PERFORMANCE STATUS: 1 - Symptomatic but completely ambulatory Vitals:   07/26/20 0849  BP: (!) 147/88  Pulse: 66  Resp: 16  Temp: (!) 97.2 F (36.2 C)   Filed Weights   07/26/20 0849  Weight: 109 lb 9.6 oz (49.7 kg)    Physical Exam Constitutional:      General: He is not in acute  distress. HENT:     Head: Normocephalic and atraumatic.  Eyes:     General: No scleral icterus. Neck:     Comments: Palpable right cervical lymphadenopathy  Cardiovascular:     Rate and Rhythm: Normal rate and regular rhythm.     Heart sounds: Normal heart sounds.  Pulmonary:     Effort: Pulmonary effort is normal. No respiratory distress.     Breath sounds: No wheezing.  Abdominal:     General: Bowel sounds are normal. There is no distension.     Palpations: Abdomen is soft.  Musculoskeletal:        General: No deformity.     Cervical back: Normal range of motion and neck supple.     Comments: Left shoulder pain has improved  Skin:    General: Skin is warm and dry.     Findings: No erythema or rash.  Neurological:     Mental Status: He is alert. Mental status is at baseline.     Cranial Nerves: No cranial nerve deficit.     Coordination: Coordination normal.     LABORATORY DATA:  I have reviewed the data as listed Lab Results  Component Value Date   WBC 6.1 07/05/2020   HGB 11.9 (L) 07/05/2020   HCT 37.2 (L) 07/05/2020   MCV 88.2 07/05/2020   PLT 195 07/05/2020   Recent Labs    09/01/19 0751 09/08/19 0832 11/15/19 0818 12/31/19 0927 04/04/20 0908 05/11/20 1130 06/29/20 0825 07/05/20 1014  NA 135 140 141   < > 140 140  --  138  K 3.9 4.2 4.1   < > 4.0 3.8  --  3.9  CL 105 107 109   < > 106 101  --  103  CO2 21* 25 24   < > 27 26  --  23  GLUCOSE  100* 125* 98   < > 101* 108*  --  92  BUN $Re'19 23 19   'fLZ$ < > 13 15  --  15  CREATININE 0.86 0.98 0.88   < > 0.85 0.86 1.00 0.98  CALCIUM 8.7* 9.0 9.2   < > 9.1 9.2  --  9.5  GFRNONAA >60 >60 >60   < > >60 >60  --  >60  GFRAA >60 >60 >60  --   --   --   --   --   PROT 6.9 6.8 7.0   < > 7.2 7.4  --  7.7  ALBUMIN 3.2* 3.2* 3.9   < > 3.8 3.7  --  4.0  AST $Re'16 16 17   'unw$ < > 21 20  --  21  ALT $Re'16 13 19   'kPW$ < > 19 14  --  18  ALKPHOS 55 56 41   < > 63 68  --  68  BILITOT 0.7 0.5 1.0   < > 1.0 1.0  --  1.2   < > = values in this interval not displayed.   Iron/TIBC/Ferritin/ %Sat No results found for: IRON, TIBC, FERRITIN, IRONPCTSAT    RADIOGRAPHIC STUDIES: I have personally reviewed the radiological images as listed and agreed with the findings in the report. CT SOFT TISSUE NECK W CONTRAST  Result Date: 06/29/2020 CLINICAL DATA:  Current head neck cancer. 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 Worsening hoarseness. EXAM: CT NECK WITH CONTRAST TECHNIQUE: Multidetector CT imaging of the neck was performed using the standard protocol following the bolus administration of intravenous contrast. CONTRAST:  92mL  OMNIPAQUE IOHEXOL 300 MG/ML  SOLN COMPARISON:  CT neck 03/31/2020 FINDINGS: Pharynx and larynx: Progressive thickening of the vocal cords bilaterally. This shows irregular enhancement and could be due to tumor versus radiation change. Direct visualization is recommended. Epiglottis normal. Oropharynx negative for recurrent tumor. Salivary glands: Parotid normal bilaterally. Atrophic submandibular gland bilaterally without acute abnormality. Thyroid: Negative Lymph nodes: 17 mm lymph node in the right mid neck at the level of the hyoid bone is new since the prior study. This has irregular enhancement and is consistent with metastatic disease. This is compressing the right jugular vein with thrombus above and below the tumor Cystic retropharyngeal lymph node on the right 14 mm, unchanged  from the prior study. Vascular: Thrombus in the right jugular vein due to compression by malignant lymph node in the right mid neck. Severe atherosclerotic disease in the carotid artery bilaterally which remain patent bilaterally. Atherosclerotic calcification aortic arch. Limited intracranial: Negative Visualized orbits: Chronic fracture right medial orbit unchanged. Mastoids and visualized paranasal sinuses: Negative Skeleton: Lytic destructive lesion T2 vertebral body on the right with mild pathologic fracture unchanged from the prior study. Metastatic disease is left scapula with pathologic fracture. Fracture not seen on prior studies. Upper chest: Lung apices clear bilaterally. Port-A-Cath tip right jugular vein. Other: None IMPRESSION: 1. 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 This is compressing the right jugular vein with thrombus in the vein above and below the lymph node. Cystic retropharyngeal lymph node on the right is stable 2. Progressive soft tissue thickening in the larynx bilaterally suspicious for tumor. Recommend direct visualization to differentiate from radiation change. 3. Metastatic disease at T2 is unchanged 4. Metastatic deposit left scapula. There is now a pathologic fracture of the scapula. Electronically Signed   By: Franchot Gallo M.D.   On: 06/29/2020 16:37  CT SOFT TISSUE NECK W CONTRAST  Result Date: 06/29/2020 CLINICAL DATA:  Current head neck cancer. 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 Worsening hoarseness. EXAM: CT NECK WITH CONTRAST TECHNIQUE: Multidetector CT imaging of the neck was performed using the standard protocol following the bolus administration of intravenous contrast. CONTRAST:  6mL OMNIPAQUE IOHEXOL 300 MG/ML  SOLN COMPARISON:  CT neck 03/31/2020 FINDINGS: Pharynx and larynx: Progressive thickening of the vocal cords bilaterally. This shows irregular enhancement and could be due to tumor versus radiation  change. Direct visualization is recommended. Epiglottis normal. Oropharynx negative for recurrent tumor. Salivary glands: Parotid normal bilaterally. Atrophic submandibular gland bilaterally without acute abnormality. Thyroid: Negative Lymph nodes: 17 mm lymph node in the right mid neck at the level of the hyoid bone is new since the prior study. This has irregular enhancement and is consistent with metastatic disease. This is compressing the right jugular vein with thrombus above and below the tumor Cystic retropharyngeal lymph node on the right 14 mm, unchanged from the prior study. Vascular: Thrombus in the right jugular vein due to compression by malignant lymph node in the right mid neck. Severe atherosclerotic disease in the carotid artery bilaterally which remain patent bilaterally. Atherosclerotic calcification aortic arch. Limited intracranial: Negative Visualized orbits: Chronic fracture right medial orbit unchanged. Mastoids and visualized paranasal sinuses: Negative Skeleton: Lytic destructive lesion T2 vertebral body on the right with mild pathologic fracture unchanged from the prior study. Metastatic disease is left scapula with pathologic fracture. Fracture not seen on prior studies. Upper chest: Lung apices clear bilaterally. Port-A-Cath tip right jugular vein. Other: None IMPRESSION: 1. New malignant  lymph node in the right mid neck at the level the hyoid. This is compressing the right jugular vein with thrombus in the vein above and below the lymph node. Cystic retropharyngeal lymph node on the right is stable 2. Progressive soft tissue thickening in the larynx bilaterally suspicious for tumor. Recommend direct visualization to differentiate from radiation change. 3. Metastatic disease at T2 is unchanged 4. Metastatic deposit left scapula. There is now a pathologic fracture of the scapula. Electronically Signed   By: Franchot Gallo M.D.   On: 06/29/2020 16:37   MR Thoracic Spine W Wo  Contrast  Result Date: 05/10/2020 CLINICAL DATA:  History of head and neck cancer and lung cancer. Right shoulder pain. Lesion in the thoracic spine at T2 on chest CT 03/31/2020. EXAM: MRI THORACIC WITHOUT AND WITH CONTRAST TECHNIQUE: Multiplanar and multiecho pulse sequences of the thoracic spine were obtained without and with intravenous contrast. CONTRAST:  5 mL GADAVIST IV SOLN COMPARISON:  Chest CT 03/31/2020. FINDINGS: Alignment:  Maintained. Vertebrae: There is abnormal signal and enhancement throughout the T2 vertebral body extending into the pedicles and right facets. Mild concavity of the superior endplate of T2 is consistent with pathologic fracture. A small focus of edema and enhancement is seen in the anterior, inferior endplate of T1. There is patchy edema and enhancement in the superior 0.7 cm of T3. Also seen is a T1 hypointense, T2 hyperintense and enhancing lesion in T12 eccentric to the left measuring 0.9 cm in diameter which is likely due to metastatic disease. Depleted marrow at C7, T1 and through the majority of T3 is consistent with prior radiation therapy. Cord:  Normal signal throughout.  No epidural tumor is identified. Paraspinal and other soft tissues: Pleural based lesion left upper lobe seen on prior CT is difficult to visualize on this exam. Disc levels: No bulge or protrusion at any level. The central canal and foramina are widely patent throughout. IMPRESSION: Findings consistent with metastatic disease throughout almost the entirety of T2 with an associated mild superior endplate compression fracture. Abnormal signal in the anterior, inferior endplate of T1 and superior aspect of T3 is also likely due to metastatic disease. Small metastatic deposit in T12 is also noted. Negative for epidural tumor. The central canal and foramina are widely patent at all levels. Appearance of the visualized cervical and upper thoracic spine through T4 consistent with prior radiation therapy.  Electronically Signed   By: Inge Rise M.D.   On: 05/10/2020 09:27   NM PET Image Restage (PS) Skull Base to Thigh  Result Date: 05/23/2020 CLINICAL DATA:  Subsequent treatment strategy for metastatic 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 biopsy-proven left upper lobe squamous cell lung carcinoma. EXAM: NUCLEAR MEDICINE PET SKULL BASE TO THIGH TECHNIQUE: 6.4 mCi F-18 FDG was injected intravenously. Full-ring PET imaging was performed from the skull base to thigh after the radiotracer. CT data was obtained and used for attenuation correction and anatomic localization. Fasting blood glucose: 63 mg/dl COMPARISON:  05/10/2020 MRI thoracic spine. 03/31/2020 CT neck and chest. 06/07/2019 PET-CT. FINDINGS: Mediastinal blood pool activity: SUV max 2.0 Liver activity: SUV max NA NECK: Persistent enlarged hypermetabolic 1.4 cm short axis diameter right lateral retropharyngeal centrally necrotic node with max SUV 3.5 (series 3/image 20), not substantially changed from 03/31/2020 neck CT. Hypermetabolic 0.8 cm right level 3 neck lymph node with max SUV 4.7 (series 3/image 48), not appreciably changed in size since 03/31/2020 neck CT, decreased from 1.9 cm with max SUV  5.6 on 06/07/2019 PET-CT. No evidence of residual or recurrent hypermetabolism at the site of the primary tumor in the right piriform sinus. No hypermetabolic left neck lymph nodes. Incidental CT findings: Right internal jugular Port-A-Cath terminates at the cavoatrial junction. CHEST: Peripheral left upper lobe 1.8 x 1.5 cm pulmonary nodule demonstrates low level hypermetabolism with max SUV 2.7 (series 3/image 89), mildly decreased from 2.1 x 1.7 cm on 03/31/2020 chest CT. New patchy bandlike consolidation in the adjacent superior segment left lower lobe compatible with early postradiation change. No enlarged or hypermetabolic axillary, mediastinal or hilar lymph nodes. Incidental CT findings: Coronary atherosclerosis.  Atherosclerotic nonaneurysmal thoracic aorta. ABDOMEN/PELVIS: No abnormal hypermetabolic activity within the liver, pancreas, adrenal glands, or spleen. No hypermetabolic lymph nodes in the abdomen or pelvis. Incidental CT findings: Brachytherapy seeds in the prostate. Marked diffuse colonic diverticulosis. Atherosclerotic nonaneurysmal abdominal aorta. SKELETON: New hypermetabolic lytic mildly expansile 2.6 cm left scapula lesion near the glenoid with max SUV 12.3. New hypermetabolic lytic T2 vertebral lesion with max SUV 4.5. Incidental CT findings: none IMPRESSION: 1. New hypermetabolic lytic bone metastases in the left scapula near the glenoid and in the T2 vertebral body. 2. Persistent hypermetabolic right lateral retropharyngeal and right level 3 neck lymph node metastases. 3. Peripheral left upper lobe pulmonary nodule is mildly decreased since 03/31/2020 chest CT with new surrounding mild bandlike postradiation changes, compatible with treatment response. Low level hypermetabolism within this nodule is nonspecific. Continued chest CT or PET-CT follow-up advised. 4. No residual or recurrent hypermetabolism at the site of the primary tumor in the right para form sinus. 5. Chronic findings include: Aortic Atherosclerosis (ICD10-I70.0). Coronary atherosclerosis. Marked colonic diverticulosis. Electronically Signed   By: Ilona Sorrel M.D.   On: 05/23/2020 09:27       ASSESSMENT & PLAN:  1. 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 Squamous cell carcinoma of left lung ([place])   5. Encounter for antineoplastic chemotherapy   6. 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 Per [name], no surgery feasible.  Potential airway compromise if tumor continues to grow I discussed with radiation oncology Dr. Baruch Gouty who would offer patient salvage radiation, hopefully offer some disease control.  Dr. Baruch Gouty would like low-dose chemotherapy to go along with radiation. Discussed with patient, wife and patient's son Jesse Fall that the goal of treatment is palliative.  They are in agreement with proceeding with treatment today. We will see patient's condition after he finishes radiation, if acceptable performance status, consider systemic immunotherapy +/- chemotherapy. Labs reviewed and discussed with patient and family.  Proceed with cisplatin 30 mg/m2 today.  Discussed  with wife about dexamethasone instruction..  Continue follow-up with palliative care service.  #Left upper lobe squamous cell carcinoma, stage I Finished SBRT on 02/07/2020.  Monitor disease status. #Right jugular vein thrombus, continue Eliquis 5 mg twice daily. #chronic hypomagnesemia, magnesium is normal today.  Continue slow magnesium 1 tablet twice daily.    All questions were answered. The patient knows to call the clinic with any problems questions or concerns.   Return of visit: 1 week lab/NP/cisplatin 2 weeks lab/MD/cisplatin  Earlie Server, MD, PhD Hematology Oncology [facility] Pager- [phone] 07/26/2020

## 2020-07-26 NOTE — Progress Notes (Signed)
Patient reports he has headaches that are improved with Tylenol.

## 2020-07-26 NOTE — Progress Notes (Signed)
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 Starting radiation and chemotherapy.    Spoke with wife as patient with memory issues.  Wife reports that appetite is good.  Yesterday able to eat 2 eggs, 2 slices cheese toast, applesauce, ensure, juice for breakfast. Lunch is either a sandwich or chef boyardee or Dinty beef stew.  Dinner maybe stewed chicken with onions, peppers over rice or mashed potatoes and green vegetable. Likes ice cream. Drinks 2-3 ensure per day.    Taking megace per wife  Denies trouble swallowing.   Medications: megace  Labs: reviewed  Anthropometrics:   Weight 109 lb today  112 lb on 4/1 118 lb on 04/04/20   NUTRITION DIAGNOSIS: Inadequate oral intake continues     INTERVENTION:  Continue megace Discussed with wife importance of increasing calories and protein.  Patient to eat q 2 hours Continue ensure plus 2-3 times per day Complimentary case of ensure given to wife today.     MONITORING, EVALUATION, GOAL: weight trends, intake   NEXT VISIT: Wednesday, June 8 during infusion  Leshae Mcclay B. Zenia Resides, [place], [place] Registered Dietitian [phone] (mobile)

## 2020-07-26 NOTE — Progress Notes (Signed)
1220: First time pt went to [facility], pt "forgot" to use urinal to measure urine. Second time using the [facility] pt urinated 100cc. [name] aware. Per [name] proceed with Cisplatin treatment as scheduled and give pt an additional 500cc of NS.   1550: Pt tolerated treatment well. Discharge instructions given to pt/spouse, and reviewed with pt and patient's spouse per pt request. Pt stable at discharge.

## 2020-07-27 ENCOUNTER — Ambulatory Visit
Admission: RE | Admit: 2020-07-27 | Discharge: 2020-07-27 | Disposition: A | Discharge status: Home / Self Care | Payer: Medicare Other | Source: Ambulatory Visit | Attending: Radiation Oncology | Admitting: Radiation Oncology

## 2020-07-27 ENCOUNTER — Inpatient Hospital Stay: Payer: Medicare Other

## 2020-07-27 DIAGNOSIS — C01 Malignant neoplasm of base of tongue: Secondary | ICD-10-CM | POA: Diagnosis not present

## 2020-07-28 ENCOUNTER — Inpatient Hospital Stay: Payer: Medicare Other

## 2020-07-28 ENCOUNTER — Ambulatory Visit: Payer: Medicare Other

## 2020-07-28 ENCOUNTER — Ambulatory Visit
Admission: RE | Admit: 2020-07-28 | Discharge: 2020-07-28 | Disposition: A | Discharge status: Home / Self Care | Payer: Medicare Other | Source: Ambulatory Visit | Attending: Radiation Oncology | Admitting: Radiation Oncology

## 2020-07-28 ENCOUNTER — Ambulatory Visit: Admission: RE | Admit: 2020-07-28 | Payer: Medicare Other | Source: Ambulatory Visit

## 2020-07-31 ENCOUNTER — Ambulatory Visit: Payer: Medicare Other

## 2020-07-31 ENCOUNTER — Inpatient Hospital Stay: Payer: Medicare Other

## 2020-07-31 DIAGNOSIS — C01 Malignant neoplasm of base of tongue: Secondary | ICD-10-CM | POA: Diagnosis not present

## 2020-08-01 ENCOUNTER — Ambulatory Visit
Admission: RE | Admit: 2020-08-01 | Discharge: 2020-08-01 | Disposition: A | Discharge status: Home / Self Care | Payer: Medicare Other | Source: Ambulatory Visit | Attending: Radiation Oncology | Admitting: Radiation Oncology

## 2020-08-01 ENCOUNTER — Inpatient Hospital Stay: Payer: Medicare Other

## 2020-08-01 ENCOUNTER — Other Ambulatory Visit: Payer: Self-pay

## 2020-08-01 DIAGNOSIS — C01 Malignant neoplasm of base of tongue: Secondary | ICD-10-CM | POA: Diagnosis not present

## 2020-08-01 DIAGNOSIS — C109 Malignant neoplasm of oropharynx, unspecified: Secondary | ICD-10-CM

## 2020-08-01 LAB — CBC
HCT: 37.9 % — ABNORMAL LOW (ref 39.0–52.0)
Hemoglobin: 12.3 g/dL — ABNORMAL LOW (ref 13.0–17.0)
MCH: 28.5 pg (ref 26.0–34.0)
MCHC: 32.5 g/dL (ref 30.0–36.0)
MCV: 87.7 fL (ref 80.0–100.0)
Platelets: 195 10*3/uL (ref 150–400)
RBC: 4.32 MIL/uL (ref 4.22–5.81)
RDW: 14.6 % (ref 11.5–15.5)
WBC: 7.9 10*3/uL (ref 4.0–10.5)
nRBC: 0 % (ref 0.0–0.2)

## 2020-08-02 ENCOUNTER — Inpatient Hospital Stay: Payer: Medicare Other

## 2020-08-02 ENCOUNTER — Other Ambulatory Visit: Payer: Self-pay

## 2020-08-02 ENCOUNTER — Inpatient Hospital Stay (HOSPITAL_BASED_OUTPATIENT_CLINIC_OR_DEPARTMENT_OTHER): Payer: Medicare Other | Admitting: Oncology

## 2020-08-02 ENCOUNTER — Encounter: Payer: Self-pay | Admitting: Oncology

## 2020-08-02 ENCOUNTER — Other Ambulatory Visit: Payer: Self-pay | Admitting: Oncology

## 2020-08-02 ENCOUNTER — Ambulatory Visit
Admission: RE | Admit: 2020-08-02 | Discharge: 2020-08-02 | Disposition: A | Discharge status: Home / Self Care | Payer: Medicare Other | Source: Ambulatory Visit | Attending: Radiation Oncology | Admitting: Radiation Oncology

## 2020-08-02 VITALS — BP 140/87 | HR 87 | Temp 97.6°F | Wt 107.0 lb

## 2020-08-02 VITALS — BP 163/71 | HR 75 | Resp 16

## 2020-08-02 DIAGNOSIS — C329 Malignant neoplasm of larynx, unspecified: Secondary | ICD-10-CM | POA: Diagnosis not present

## 2020-08-02 DIAGNOSIS — R634 Abnormal weight loss: Secondary | ICD-10-CM

## 2020-08-02 DIAGNOSIS — Z95828 Presence of other vascular implants and grafts: Secondary | ICD-10-CM

## 2020-08-02 DIAGNOSIS — C01 Malignant neoplasm of base of tongue: Secondary | ICD-10-CM | POA: Diagnosis not present

## 2020-08-02 LAB — CBC WITH DIFFERENTIAL/PLATELET
Abs Immature Granulocytes: 0.07 10*3/uL (ref 0.00–0.07)
Basophils Absolute: 0 10*3/uL (ref 0.0–0.1)
Basophils Relative: 0 %
Eosinophils Absolute: 0.1 10*3/uL (ref 0.0–0.5)
Eosinophils Relative: 1 %
HCT: 36.1 % — ABNORMAL LOW (ref 39.0–52.0)
Hemoglobin: 11.9 g/dL — ABNORMAL LOW (ref 13.0–17.0)
Immature Granulocytes: 1 %
Lymphocytes Relative: 10 %
Lymphs Abs: 0.9 10*3/uL (ref 0.7–4.0)
MCH: 28.4 pg (ref 26.0–34.0)
MCHC: 33 g/dL (ref 30.0–36.0)
MCV: 86.2 fL (ref 80.0–100.0)
Monocytes Absolute: 0.8 10*3/uL (ref 0.1–1.0)
Monocytes Relative: 9 %
Neutro Abs: 7.2 10*3/uL (ref 1.7–7.7)
Neutrophils Relative %: 79 %
Platelets: 218 10*3/uL (ref 150–400)
RBC: 4.19 MIL/uL — ABNORMAL LOW (ref 4.22–5.81)
RDW: 14.8 % (ref 11.5–15.5)
WBC: 9 10*3/uL (ref 4.0–10.5)
nRBC: 0 % (ref 0.0–0.2)

## 2020-08-02 LAB — COMPREHENSIVE METABOLIC PANEL
ALT: 22 U/L (ref 0–44)
AST: 17 U/L (ref 15–41)
Albumin: 3.5 g/dL (ref 3.5–5.0)
Alkaline Phosphatase: 53 U/L (ref 38–126)
Anion gap: 10 (ref 5–15)
BUN: 18 mg/dL (ref 8–23)
CO2: 25 mmol/L (ref 22–32)
Calcium: 8.9 mg/dL (ref 8.9–10.3)
Chloride: 101 mmol/L (ref 98–111)
Creatinine, Ser: 0.76 mg/dL (ref 0.61–1.24)
GFR, Estimated: >60 mL/min (ref >60)
Glucose, Bld: 129 mg/dL — ABNORMAL HIGH (ref 70–99)
Potassium: 3.5 mmol/L (ref 3.5–5.1)
Sodium: 136 mmol/L (ref 135–145)
Total Bilirubin: 1 mg/dL (ref 0.3–1.2)
Total Protein: 6.9 g/dL (ref 6.5–8.1)

## 2020-08-02 LAB — MAGNESIUM: Magnesium: 1.8 mg/dL (ref 1.7–2.4)

## 2020-08-02 MED ORDER — PALONOSETRON HCL INJECTION 0.25 MG/5ML
0.2500 mg | Freq: Once | INTRAVENOUS | Status: AC
Start: 2020-08-02 — End: 2020-08-02
  Administered 2020-08-02: 0.25 mg via INTRAVENOUS
  Filled 2020-08-02: qty 5

## 2020-08-02 MED ORDER — SODIUM CHLORIDE 0.9 % IV SOLN
10.0000 mg | Freq: Once | INTRAVENOUS | Status: AC
  Administered 2020-08-02: 10 mg via INTRAVENOUS
  Filled 2020-08-02: qty 10

## 2020-08-02 MED ORDER — PANTOPRAZOLE SODIUM 20 MG PO TBEC: 20.0000 mg | DELAYED_RELEASE_TABLET | Freq: Two times a day (BID) | ORAL | 0 refills | Status: DC

## 2020-08-02 MED ORDER — SODIUM CHLORIDE 0.9 % IV SOLN
150.0000 mg | Freq: Once | INTRAVENOUS | Status: AC
  Administered 2020-08-02: 150 mg via INTRAVENOUS
  Filled 2020-08-02: qty 150

## 2020-08-02 MED ORDER — POTASSIUM CHLORIDE IN NACL 20-0.9 MEQ/L-% IV SOLN
Freq: Once | INTRAVENOUS | Status: AC
  Filled 2020-08-02: qty 1000

## 2020-08-02 MED ORDER — SODIUM CHLORIDE 0.9% FLUSH
10.0000 mL | Freq: Once | INTRAVENOUS | Status: AC
Start: 2020-08-02 — End: 2020-08-02
  Administered 2020-08-02: 10 mL via INTRAVENOUS
  Filled 2020-08-02: qty 10

## 2020-08-02 MED ORDER — HEPARIN SOD (PORK) LOCK FLUSH 100 UNIT/ML IV SOLN
500.0000 [IU] | Freq: Once | INTRAVENOUS | Status: DC
  Filled 2020-08-02: qty 5

## 2020-08-02 MED ORDER — SODIUM CHLORIDE 0.9 % IV SOLN
30.0000 mg/m2 | Freq: Once | INTRAVENOUS | Status: AC
  Administered 2020-08-02: 48 mg via INTRAVENOUS
  Filled 2020-08-02: qty 48

## 2020-08-02 MED ORDER — MAGNESIUM SULFATE 2 GM/50ML IV SOLN
2.0000 g | Freq: Once | INTRAVENOUS | Status: AC
  Administered 2020-08-02: 2 g via INTRAVENOUS
  Filled 2020-08-02: qty 50

## 2020-08-02 MED ORDER — SODIUM CHLORIDE 0.9 % IV SOLN
Freq: Once | INTRAVENOUS | Status: AC
  Filled 2020-08-02: qty 250

## 2020-08-02 MED ORDER — HEPARIN SOD (PORK) LOCK FLUSH 100 UNIT/ML IV SOLN
INTRAVENOUS | Status: AC
  Filled 2020-08-02: qty 5

## 2020-08-02 MED ORDER — HEPARIN SOD (PORK) LOCK FLUSH 100 UNIT/ML IV SOLN
500.0000 [IU] | Freq: Once | INTRAVENOUS | Status: AC | PRN
  Administered 2020-08-02: 500 [IU]
  Filled 2020-08-02: qty 5

## 2020-08-02 NOTE — Progress Notes (Signed)
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  Patient receiving radiation and chemotherapy.   Met with patient during infusion.  Wife not available in waiting area when RD checked.  Patient reports that his appetite is fair.  Likes ensure drinks and says that he is drinking about 3 of those per day.  Wife cooks and prepares meals for patient. Patient with memory issues.    Medications: megace  Labs: reviewed  Anthropometrics:   Weight 107 lb today  109 lb on 6/1 112 lb on 4/1 118 lb on 04/04/20   NUTRITION DIAGNOSIS: Inadequate oral intake continues   INTERVENTION:  Encouraged patient to increase ensure plus to 4 times per day. (1400 calories and 52 g protein) Wife has RD contact information    MONITORING, EVALUATION, GOAL: weight trends, intake   NEXT VISIT: Wednesday, June 15 during infusion  Jakolby Sedivy B. Zenia Resides, [place], [place] Registered Dietitian [phone] (mobile)

## 2020-08-02 NOTE — Progress Notes (Signed)
Patient here for oncology follow-up appointment, expresses  concerns of fatigue and stiffness

## 2020-08-02 NOTE — Patient Instructions (Signed)
[facility]  Discharge Instructions: Thank you for choosing [facility] to provide your oncology and hematology care.  If you have a lab appointment with the [facility], please go directly to the [facility] and check in at the registration area.  Wear comfortable clothing and clothing appropriate for easy access to any Portacath or PICC line.   We strive to give you quality time with your provider. You may need to reschedule your appointment if you arrive late (15 or more minutes).  Arriving late affects you and other patients whose appointments are after yours.  Also, if you miss three or more appointments without notifying the office, you may be dismissed from the clinic at the provider's discretion.      For prescription refill requests, have your pharmacy contact our office and allow 72 hours for refills to be completed.    Today you received the following chemotherapy and/or immunotherapy agents Cisplatin      To help prevent nausea and vomiting after your treatment, we encourage you to take your nausea medication as directed.  BELOW ARE SYMPTOMS THAT SHOULD BE REPORTED IMMEDIATELY: . *FEVER GREATER THAN 100.4 F (38 C) OR HIGHER . *CHILLS OR SWEATING . *NAUSEA AND VOMITING THAT IS NOT CONTROLLED WITH YOUR NAUSEA MEDICATION . *UNUSUAL SHORTNESS OF BREATH . *UNUSUAL BRUISING OR BLEEDING . *URINARY PROBLEMS (pain or burning when urinating, or frequent urination) . *BOWEL PROBLEMS (unusual diarrhea, constipation, pain near the anus) . TENDERNESS IN MOUTH AND THROAT WITH OR WITHOUT PRESENCE OF ULCERS (sore throat, sores in mouth, or a toothache) . UNUSUAL RASH, SWELLING OR PAIN  . UNUSUAL VAGINAL DISCHARGE OR ITCHING   Items with * indicate a potential emergency and should be followed up as soon as possible or go to the Emergency Department if any problems should occur.  Please show the CHEMOTHERAPY ALERT CARD or IMMUNOTHERAPY  ALERT CARD at check-in to the Emergency Department and triage nurse.  Should you have questions after your visit or need to cancel or reschedule your appointment, please contact [facility]  [phone] and follow the prompts.  Office hours are 8:00 a.m. to 4:30 p.m. Monday - Friday. Please note that voicemails left after 4:00 p.m. may not be returned until the following business day.  We are closed weekends and major holidays. You have access to a nurse at all times for urgent questions. Please call the main number to the clinic [phone] and follow the prompts.  For any non-urgent questions, you may also contact your provider using MyChart. We now offer e-Visits for anyone 3 and older to request care online for non-urgent symptoms. For details visit [email or web address].   Also download the MyChart app! Go to the app store, search "MyChart", open the app, select [facility], and log in with your MyChart username and password.  Due to Covid, a mask is required upon entering the hospital/clinic. If you do not have a mask, one will be given to you upon arrival. For doctor visits, patients may have 1 support person aged 18 or older with them. For treatment visits, patients cannot have anyone with them due to current Covid guidelines and our immunocompromised population.

## 2020-08-02 NOTE — Progress Notes (Signed)
Hematology/Oncology progress note [facility] [phone] [phone]   Patient Care Team: [name], [name], MD as PCP - General (Internal Medicine) Yu, Zhou, MD as Consulting Physician (Oncology)  REFERRING PROVIDER: [name], [name], [name]  HISTORY OF PRESENTING ILLNESS:  Gregory Burton is a  79 y.o.  male with PMH listed below was seen in consultation at the request of  [name], [name], MD  for evaluation of lymph node.  Patient moved from [place] to [place] for about 3 weeks. He has noticed a neck knot on the right side of his neck. The knot is sore and makes him to cough and he feels it when he swallows. No swallowing difficulty.  Patient was accompanied by his wife. She reports that patient's previous PCP has done neck sonogram for evaluation.  Patient also had right lower molar tooth extraction done a few weeks before he noticed  He also took a course of antibiotics.  Due to his tooth pain, his appetite has decreased and he has lost weight loss, about 20 pounds, he can not specify the time frame.   Alcoholism History of prostate cancer. S/p Radiation/seed, he follows up with [facility].   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  Interval decrease of the posterior left upper lobe pleural-based nodule  with interval decrease in size of the tiny adjacent satellite nodule.  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  No recurrent tumor in right piriform sinus.  Right lateral pharyngeal lymph node is stable.  No new or recurrent adenopathy.Suspect metastasis.  I recommend patient to obtain PET scan restaging.  I discussed about the plan of re-biopsy of either the paraspinal soft tissue mass versus other more feasible hypermetabolic sites detected on PET scan. Patient was seen by [name] and opted to proceed with palliative radiation as soon as possible.  PET scan cannot be arranged prior to the radiation so the PET scan was canceled. 04/20/2020 -04/25/2020, palliative radiation to thoracic spine  05/10/2020, MRI thoracic spine was obtained for worsening of back pain and left shoulder pain.  Images showed metastatic disease throughout almost the entire T2 with and associated mild superior endplate compression fracture.  Abnormal signal in the anterior, inferior endplate of T1, superior aspect of the T3.  Small metastatic deposit in T12 is also noted. Case was also discussed on multidisciplinary tumor board on 05/25/2020 05/22/2020, PET scan showed new hypermetabolic lytic bone metastasis in the left scapular near the glenoid and in the T2 vertebral body.  Persistent hypermetabolic right lateral retropharyngeal and right level 3 neck lymph node metastasis.  Peripheral left upper lobe pulmonary nodule is mildly decreased with new surrounding mild platelike postradiation changes..  No residual recurrent neoplasm at the site of vomiting in the right piriform sinus  06/07/2020 -06/09/2020 patient underwent palliative radiation to left scapular April 2022 seen by [name]   and flexible laryngoscope examination showed new lesion at the right false vocal cord.  Case was discussed on tumor board on 06/22/2020. [name] recommend additional CT soft tissue neck with contrast for additional  evaluation. 06/29/2020 CT images were reviewed and discussed with patient. There is new malignant lymph node in the right mid neck at the level of the hyoid, this compressing the right jugular vein with thrombus in the vein above and below the lymph node.  Cystic retropharyngeal lymph node on the right is stable.  Progressive soft tissue thickening in the larynx bilaterally suspicious for tumor.  Metastatic disease at the T2 is unchanged.  Metastatic deposit left scapula there is now a pathological fracture of the scapular. CT scan findings were discussed with ENT [name] via secure chat.  [name] feels surgery is not feasible.  The larynx lesion currently does not compromise his airway however further progression may in the future  07/10/2020 Seen by Radonc [name] who would offer salvage radiation to his larynx as well as neck adenopathy. Would like low dose chemotherapy as sensitizer.    Patient was also referred to establish care with Dr. Georgiann Cocker at [facility] for kyphoplasty.  Patient's wife reports that they were never contacted and she called Dr. Georgiann Cocker office with no success.   NGS.  PD-L1-IHC 40, no targetable mutations.   INTERVAL HISTORY Gregory Burton is a 80 y.o. male who presents today for next cycle of weekly cisplatin.  He is accompanied by his wife.  He continues to tolerate treatments well.  His appetite is fair.  He denies any issues with his bowels.  Reports a little bit of nausea but no vomiting after his treatments.  Having some burping and acid reflux type symptoms following treatments.  Currently not on any acid reducing medications.  Weight is down 2 pounds.  Review of Systems  Constitutional: Negative.  Negative for appetite change, chills, fatigue and fever.  HENT:  Negative.  Negative for hearing loss, lump/mass, mouth sores and nosebleeds.   Eyes: Negative.  Negative for eye problems.  Respiratory: Negative for cough, hemoptysis and shortness of breath.    Cardiovascular: Negative.  Negative for chest pain and leg swelling.  Gastrointestinal: Positive for nausea. Negative for abdominal pain, blood in stool, constipation, diarrhea and vomiting.       Hiccups  Endocrine: Negative.  Negative for hot flashes.  Genitourinary: Negative.  Negative for bladder incontinence, difficulty urinating, dysuria, frequency and hematuria.   Musculoskeletal: Negative.  Negative for back pain, flank pain, gait problem and myalgias.  Skin: Negative.  Negative for itching and rash.  Neurological: Negative.  Negative for dizziness, gait problem, headaches, light-headedness and numbness.  Hematological: Positive for adenopathy.  Psychiatric/Behavioral: Negative for confusion. The patient is not nervous/anxious.     MEDICAL HISTORY:  Past Medical History:  Diagnosis Date  . Alcohol abuse   . Blood transfusion without reported diagnosis 2013  . Cataract   . Dementia ([place])   . Glaucoma   . Gout   . Hypercholesteremia   . Hypertension   . 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 Prostate CA ([place]) 2008  . Squamous cell lung cancer ([place]) 06/23/2019    SURGICAL HISTORY: Past Surgical History:  Procedure Laterality Date  . BRAIN SURGERY  2012  . HERNIA REPAIR    . PORTA CATH INSERTION N/A 07/27/2019   Procedure: PORTA CATH INSERTION;  Surgeon: Katha Cabal, MD;  Location: [facility];  Service: Cardiovascular;  Laterality: N/A;    SOCIAL HISTORY: Social History   Socioeconomic History  . Marital status: Married    Spouse name: Not on file  . Number of children: Not on file  . Years of education: Not on file  . Highest education level: Not on file  Occupational History  . Not on file  Tobacco Use  . Smoking status: Former Smoker    Years: 21.00    Types: Cigarettes    Quit date: 05/11/2008    Years since quitting: 12.2  . Smokeless tobacco: Never Used  Vaping Use  . Vaping Use: Never used  Substance and  Sexual Activity  . Alcohol use: Yes    Comment: 0.5 pint liquor a week  . Drug use: Never  . Sexual activity: Not Currently  Other Topics Concern  . Not on file  Social History Narrative   Lives at home with wife. Wife states he has some dementia but is able to sign his own consent.   Social Determinants of Health   Financial Resource Strain: Not on file  Food Insecurity: Not on file  Transportation Needs: Not on file  Physical Activity: Not on file  Stress: Not on file  Social Connections: Not on file  Intimate Partner Violence: Not on file    FAMILY HISTORY: No family history on file.  ALLERGIES:  is allergic to enalapril.  MEDICATIONS:  Current Outpatient Medications  Medication Sig Dispense Refill  . apixaban (ELIQUIS) 2.5 MG TABS tablet Take 1 tablet (2.5 mg total) by mouth 2 (two) times daily. 60 tablet 1  . atorvastatin (LIPITOR) 20 MG tablet Take 20 mg by mouth daily.    Marland Kitchen dexamethasone (DECADRON) 4 MG tablet Take 2 tablets (8 mg total) by mouth daily. Take daily x 3 days starting the day after cisplatin chemotherapy. Take with food. 30 tablet 1  . donepezil (ARICEPT) 10 MG tablet Take 10 mg by mouth at bedtime.    . dorzolamide-timolol (COSOPT) 22.3-6.8 MG/ML ophthalmic solution Place 1 drop into both eyes 2 (two) times daily.     . folic acid (FOLVITE) 1 MG tablet Take 1 mg by mouth daily.    Marland Kitchen latanoprost (XALATAN) 0.005 % ophthalmic solution Place 1 drop into both eyes at bedtime.     . lidocaine-prilocaine (EMLA) cream Apply 1 application topically as needed. Apply small amount to port site approx 1-2 hours prior to appointment. 30 g 1  . MAG64 64 MG TBEC TAKE 1 TABLET (64 MG TOTAL) BY MOUTH 2 (TWO) TIMES DAILY. 180 tablet 0  . megestrol (MEGACE) 40 MG tablet TAKE 1 TABLET BY MOUTH TWICE A DAY 60 tablet 0  . Multiple Vitamins-Minerals (CENTRUM ADULTS PO) Take by mouth.    . oxyCODONE (OXY IR/ROXICODONE) 5 MG immediate release tablet Take 1 tablet (5 mg total) by  mouth every 4 (four) hours as needed for severe pain. 90 tablet 0  . Thiamine HCl (VITAMIN B-1 PO) Take 100 mg by mouth daily.     No current facility-administered medications for this visit.   Facility-Administered Medications Ordered in Other Visits  Medication Dose Route Frequency Provider Last Rate Last Admin  . sodium chloride flush (NS) 0.9 % injection 10 mL  10 mL Intravenous PRN Earlie Server, MD   10 mL at 03/01/20 0812     PHYSICAL EXAMINATION: ECOG PERFORMANCE STATUS: 1 - Symptomatic but completely ambulatory Vitals:   08/02/20 0950  BP: 140/87  Pulse: 87  Temp: 97.6 F (  36.4 C)  SpO2: 100%   Filed Weights   08/02/20 0950  Weight: 107 lb (48.5 kg)    Physical Exam Constitutional:      General: He is not in acute distress. HENT:     Head: Normocephalic and atraumatic.  Eyes:     General: No scleral icterus. Neck:     Comments: Palpable right cervical lymphadenopathy  Cardiovascular:     Rate and Rhythm: Normal rate and regular rhythm.     Heart sounds: Normal heart sounds.  Pulmonary:     Effort: Pulmonary effort is normal. No respiratory distress.     Breath sounds: No wheezing.  Abdominal:     General: Bowel sounds are normal. There is no distension.     Palpations: Abdomen is soft.  Musculoskeletal:        General: No deformity.     Cervical back: Normal range of motion and neck supple.     Comments: Left shoulder pain has improved  Skin:    General: Skin is warm and dry.     Findings: No erythema or rash.  Neurological:     Mental Status: He is alert. Mental status is at baseline.     Cranial Nerves: No cranial nerve deficit.     Coordination: Coordination normal.     LABORATORY DATA:  I have reviewed the data as listed Lab Results  Component Value Date   WBC 9.0 08/02/2020   HGB 11.9 (L) 08/02/2020   HCT 36.1 (L) 08/02/2020   MCV 86.2 08/02/2020   PLT 218 08/02/2020   Recent Labs    09/01/19 0751 09/08/19 0832 11/15/19 0818  12/31/19 0927 07/05/20 1014 07/26/20 0837 08/02/20 0855  NA 135 140 141   < > 138 139 136  K 3.9 4.2 4.1   < > 3.9 3.9 3.5  CL 105 107 109   < > 103 106 101  CO2 21* 25 24   < > $R'23 24 25  'Qj$ GLUCOSE 100* 125* 98   < > 92 114* 129*  BUN $Re'19 23 19   'eIY$ < > $R'15 16 18  'jo$ CREATININE 0.86 0.98 0.88   < > 0.98 0.84 0.76  CALCIUM 8.7* 9.0 9.2   < > 9.5 9.6 8.9  GFRNONAA >60 >60 >60   < > >60 >60 >60  GFRAA >60 >60 >60  --   --   --   --   PROT 6.9 6.8 7.0   < > 7.7 7.4 6.9  ALBUMIN 3.2* 3.2* 3.9   < > 4.0 4.1 3.5  AST $Re'16 16 17   'hzU$ < > $R'21 19 17  'wN$ ALT $'16 13 19   'g$ < > $R'18 18 22  'Uc$ ALKPHOS 55 56 41   < > 68 61 53  BILITOT 0.7 0.5 1.0   < > 1.2 1.2 1.0   < > = values in this interval not displayed.   Iron/TIBC/Ferritin/ %Sat No results found for: IRON, TIBC, FERRITIN, IRONPCTSAT    RADIOGRAPHIC STUDIES: I have personally reviewed the radiological images as listed and agreed with the findings in the report. No results found.CT SOFT TISSUE NECK W CONTRAST  Result Date: 06/29/2020 CLINICAL DATA:  Current head neck cancer. 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 Worsening hoarseness. EXAM: CT NECK WITH CONTRAST TECHNIQUE: Multidetector CT imaging of the neck was performed using the standard protocol following the bolus administration of intravenous contrast. CONTRAST:  58mL OMNIPAQUE IOHEXOL 300 MG/ML  SOLN COMPARISON:  CT neck  03/31/2020 FINDINGS: Pharynx and larynx: Progressive thickening of the vocal cords bilaterally. This shows irregular enhancement and could be due to tumor versus radiation change. Direct visualization is recommended. Epiglottis normal. Oropharynx negative for recurrent tumor. Salivary glands: Parotid normal bilaterally. Atrophic submandibular gland bilaterally without acute abnormality. Thyroid: Negative Lymph nodes: 17 mm lymph node in the right mid neck at the level of the hyoid bone is new since the prior study. This has irregular enhancement and is consistent with  metastatic disease. This is compressing the right jugular vein with thrombus above and below the tumor Cystic retropharyngeal lymph node on the right 14 mm, unchanged from the prior study. Vascular: Thrombus in the right jugular vein due to compression by malignant lymph node in the right mid neck. Severe atherosclerotic disease in the carotid artery bilaterally which remain patent bilaterally. Atherosclerotic calcification aortic arch. Limited intracranial: Negative Visualized orbits: Chronic fracture right medial orbit unchanged. Mastoids and visualized paranasal sinuses: Negative Skeleton: Lytic destructive lesion T2 vertebral body on the right with mild pathologic fracture unchanged from the prior study. Metastatic disease is left scapula with pathologic fracture. Fracture not seen on prior studies. Upper chest: Lung apices clear bilaterally. Port-A-Cath tip right jugular vein. Other: None IMPRESSION: 1. 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 This is compressing the right jugular vein with thrombus in the vein above and below the lymph node. Cystic retropharyngeal lymph node on the right is stable 2. Progressive soft tissue thickening in the larynx bilaterally suspicious for tumor. Recommend direct visualization to differentiate from radiation change. 3. Metastatic disease at T2 is unchanged 4. Metastatic deposit left scapula. There is now a pathologic fracture of the scapula. Electronically Signed   By: Franchot Gallo M.D.   On: 06/29/2020 16:37   MR Thoracic Spine W Wo Contrast  Result Date: 05/10/2020 CLINICAL DATA:  History of head and neck cancer and lung cancer. Right shoulder pain. Lesion in the thoracic spine at T2 on chest CT 03/31/2020. EXAM: MRI THORACIC WITHOUT AND WITH CONTRAST TECHNIQUE: Multiplanar and multiecho pulse sequences of the thoracic spine were obtained without and with intravenous contrast. CONTRAST:  5 mL GADAVIST IV SOLN COMPARISON:  Chest CT  03/31/2020. FINDINGS: Alignment:  Maintained. Vertebrae: There is abnormal signal and enhancement throughout the T2 vertebral body extending into the pedicles and right facets. Mild concavity of the superior endplate of T2 is consistent with pathologic fracture. A small focus of edema and enhancement is seen in the anterior, inferior endplate of T1. There is patchy edema and enhancement in the superior 0.7 cm of T3. Also seen is a T1 hypointense, T2 hyperintense and enhancing lesion in T12 eccentric to the left measuring 0.9 cm in diameter which is likely due to metastatic disease. Depleted marrow at C7, T1 and through the majority of T3 is consistent with prior radiation therapy. Cord:  Normal signal throughout.  No epidural tumor is identified. Paraspinal and other soft tissues: Pleural based lesion left upper lobe seen on prior CT is difficult to visualize on this exam. Disc levels: No bulge or protrusion at any level. The central canal and foramina are widely patent throughout. IMPRESSION: Findings consistent with metastatic disease throughout almost the entirety of T2 with an associated mild superior endplate compression fracture. Abnormal signal in the anterior, inferior endplate of T1 and superior aspect of T3 is also likely due to metastatic disease. Small metastatic deposit in T12 is also noted. Negative for epidural tumor. The  central canal and foramina are widely patent at all levels. Appearance of the visualized cervical and upper thoracic spine through T4 consistent with prior radiation therapy. Electronically Signed   By: Inge Rise M.D.   On: 05/10/2020 09:27   NM PET Image Restage (PS) Skull Base to Thigh  Result Date: 05/23/2020 CLINICAL DATA:  Subsequent treatment strategy for metastatic 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 biopsy-proven left upper lobe squamous cell lung carcinoma. EXAM: NUCLEAR MEDICINE PET SKULL BASE TO THIGH TECHNIQUE: 6.4 mCi F-18 FDG  was injected intravenously. Full-ring PET imaging was performed from the skull base to thigh after the radiotracer. CT data was obtained and used for attenuation correction and anatomic localization. Fasting blood glucose: 63 mg/dl COMPARISON:  05/10/2020 MRI thoracic spine. 03/31/2020 CT neck and chest. 06/07/2019 PET-CT. FINDINGS: Mediastinal blood pool activity: SUV max 2.0 Liver activity: SUV max NA NECK: Persistent enlarged hypermetabolic 1.4 cm short axis diameter right lateral retropharyngeal centrally necrotic node with max SUV 3.5 (series 3/image 20), not substantially changed from 03/31/2020 neck CT. Hypermetabolic 0.8 cm right level 3 neck lymph node with max SUV 4.7 (series 3/image 48), not appreciably changed in size since 03/31/2020 neck CT, decreased from 1.9 cm with max SUV 5.6 on 06/07/2019 PET-CT. No evidence of residual or recurrent hypermetabolism at the site of the primary tumor in the right piriform sinus. No hypermetabolic left neck lymph nodes. Incidental CT findings: Right internal jugular Port-A-Cath terminates at the cavoatrial junction. CHEST: Peripheral left upper lobe 1.8 x 1.5 cm pulmonary nodule demonstrates low level hypermetabolism with max SUV 2.7 (series 3/image 89), mildly decreased from 2.1 x 1.7 cm on 03/31/2020 chest CT. New patchy bandlike consolidation in the adjacent superior segment left lower lobe compatible with early postradiation change. No enlarged or hypermetabolic axillary, mediastinal or hilar lymph nodes. Incidental CT findings: Coronary atherosclerosis. Atherosclerotic nonaneurysmal thoracic aorta. ABDOMEN/PELVIS: No abnormal hypermetabolic activity within the liver, pancreas, adrenal glands, or spleen. No hypermetabolic lymph nodes in the abdomen or pelvis. Incidental CT findings: Brachytherapy seeds in the prostate. Marked diffuse colonic diverticulosis. Atherosclerotic nonaneurysmal abdominal aorta. SKELETON: New hypermetabolic lytic mildly expansile 2.6 cm  left scapula lesion near the glenoid with max SUV 12.3. New hypermetabolic lytic T2 vertebral lesion with max SUV 4.5. Incidental CT findings: none IMPRESSION: 1. New hypermetabolic lytic bone metastases in the left scapula near the glenoid and in the T2 vertebral body. 2. Persistent hypermetabolic right lateral retropharyngeal and right level 3 neck lymph node metastases. 3. Peripheral left upper lobe pulmonary nodule is mildly decreased since 03/31/2020 chest CT with new surrounding mild bandlike postradiation changes, compatible with treatment response. Low level hypermetabolism within this nodule is nonspecific. Continued chest CT or PET-CT follow-up advised. 4. No residual or recurrent hypermetabolism at the site of the primary tumor in the right para form sinus. 5. Chronic findings include: Aortic Atherosclerosis (ICD10-I70.0). Coronary atherosclerosis. Marked colonic diverticulosis. Electronically Signed   By: Ilona Sorrel M.D.   On: 05/23/2020 09:27       ASSESSMENT & PLAN:  No diagnosis found.Cancer Staging Larynx cancer [facility]) Staging form: Larynx - Glottis, AJCC 8th Edition - Clinical: Stage IVC (cT1, cNX, cM1) - Signed by Earlie Server, MD on 07/26/2020  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[facility]) Staging form: Pharynx - P16 Negative Oropharynx, AJCC 8th Edition - Clinical stage from 06/23/2019: Stage IVB (cT4b, cN2b, cM0, p16-) - Signed by Earlie Server, MD on 06/23/2019  Squamous cell lung cancer [facility]) Staging form: Lung, AJCC 8th Edition -  Clinical: cT1, cN0, cM0 - Signed by Earlie Server, MD on 07/19/2019   #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 Per [name], no surgery feasible.   Potential airway compromise if tumor continues to grow I discussed with radiation  oncology Dr. Baruch Gouty who would offer patient salvage radiation, hopefully offer some disease control.   Dr. Baruch Gouty would like low-dose chemotherapy to go along with radiation. Discussed with patient, wife and patient's son Jesse Fall that the goal of treatment is palliative.  They are in agreement with proceeding with treatment today. We will see patient's condition after he finishes radiation, if acceptable performance status, consider systemic immunotherapy +/- chemotherapy. Labs reviewed and discussed with patient and family.   Proceed with cisplatin 30 mg/m2 today.   Continue dexamethasone following treatment for 3 days.  #Left upper lobe squamous cell carcinoma, stage I Finished SBRT on 02/07/2020.   Monitor disease status.  #Right jugular vein thrombus,  Continue Eliquis 5 mg twice daily.  #chronic hypomagnesemia, Magnesium is normal today.   Continue slow magnesium 1 tablet twice daily.    #GERD symptoms/nausea Possibly secondary to steroids. Recommend Protonix 20 mg BID.  Patient reluctant to begin new prescriptive medication.   Could try Pepcid OTC 20 mg twice a day.   Return to clinic in 2 weeks as scheduled to see [name]  Greater than 50% was spent in counseling and coordination of care with this patient including but not limited to discussion of the relevant topics above (See A&P) including, but not limited to diagnosis and management of acute and chronic medical conditions.   Faythe Casa, NP 08/02/2020 10:29 AM

## 2020-08-03 ENCOUNTER — Inpatient Hospital Stay: Payer: Medicare Other

## 2020-08-03 ENCOUNTER — Ambulatory Visit
Admission: RE | Admit: 2020-08-03 | Discharge: 2020-08-03 | Disposition: A | Discharge status: Home / Self Care | Payer: Medicare Other | Source: Ambulatory Visit | Attending: Radiation Oncology | Admitting: Radiation Oncology

## 2020-08-03 DIAGNOSIS — C01 Malignant neoplasm of base of tongue: Secondary | ICD-10-CM | POA: Diagnosis not present

## 2020-08-04 ENCOUNTER — Inpatient Hospital Stay: Payer: Medicare Other

## 2020-08-04 ENCOUNTER — Ambulatory Visit
Admission: RE | Admit: 2020-08-04 | Discharge: 2020-08-04 | Disposition: A | Discharge status: Home / Self Care | Payer: Medicare Other | Source: Ambulatory Visit | Attending: Radiation Oncology | Admitting: Radiation Oncology

## 2020-08-04 ENCOUNTER — Other Ambulatory Visit: Payer: Self-pay

## 2020-08-04 DIAGNOSIS — C01 Malignant neoplasm of base of tongue: Secondary | ICD-10-CM | POA: Diagnosis not present

## 2020-08-07 ENCOUNTER — Inpatient Hospital Stay: Payer: Medicare Other

## 2020-08-07 ENCOUNTER — Other Ambulatory Visit: Payer: Self-pay

## 2020-08-07 ENCOUNTER — Ambulatory Visit
Admission: RE | Admit: 2020-08-07 | Discharge: 2020-08-07 | Disposition: A | Discharge status: Home / Self Care | Payer: Medicare Other | Source: Ambulatory Visit | Attending: Radiation Oncology | Admitting: Radiation Oncology

## 2020-08-07 DIAGNOSIS — C01 Malignant neoplasm of base of tongue: Secondary | ICD-10-CM | POA: Diagnosis not present

## 2020-08-08 ENCOUNTER — Inpatient Hospital Stay: Payer: Medicare Other

## 2020-08-08 ENCOUNTER — Ambulatory Visit
Admission: RE | Admit: 2020-08-08 | Discharge: 2020-08-08 | Disposition: A | Discharge status: Home / Self Care | Payer: Medicare Other | Source: Ambulatory Visit | Attending: Radiation Oncology | Admitting: Radiation Oncology

## 2020-08-08 ENCOUNTER — Encounter: Payer: Self-pay | Admitting: Oncology

## 2020-08-08 DIAGNOSIS — C01 Malignant neoplasm of base of tongue: Secondary | ICD-10-CM | POA: Diagnosis not present

## 2020-08-09 ENCOUNTER — Inpatient Hospital Stay: Payer: Medicare Other

## 2020-08-09 ENCOUNTER — Other Ambulatory Visit: Payer: Self-pay

## 2020-08-09 ENCOUNTER — Ambulatory Visit
Admission: RE | Admit: 2020-08-09 | Discharge: 2020-08-09 | Disposition: A | Discharge status: Home / Self Care | Payer: Medicare Other | Source: Ambulatory Visit | Attending: Radiation Oncology | Admitting: Radiation Oncology

## 2020-08-09 ENCOUNTER — Inpatient Hospital Stay (HOSPITAL_BASED_OUTPATIENT_CLINIC_OR_DEPARTMENT_OTHER): Payer: Medicare Other | Admitting: Oncology

## 2020-08-09 ENCOUNTER — Encounter: Payer: Self-pay | Admitting: Oncology

## 2020-08-09 VITALS — BP 108/73 | HR 87 | Temp 98.0°F | Resp 18 | Wt 106.4 lb

## 2020-08-09 DIAGNOSIS — C329 Malignant neoplasm of larynx, unspecified: Secondary | ICD-10-CM | POA: Diagnosis not present

## 2020-08-09 DIAGNOSIS — F1027 Alcohol dependence with alcohol-induced persisting dementia: Secondary | ICD-10-CM

## 2020-08-09 DIAGNOSIS — C01 Malignant neoplasm of base of tongue: Secondary | ICD-10-CM | POA: Diagnosis not present

## 2020-08-09 DIAGNOSIS — Z5111 Encounter for antineoplastic chemotherapy: Secondary | ICD-10-CM

## 2020-08-09 DIAGNOSIS — C109 Malignant neoplasm of oropharynx, unspecified: Secondary | ICD-10-CM | POA: Diagnosis not present

## 2020-08-09 DIAGNOSIS — C3492 Malignant neoplasm of unspecified part of left bronchus or lung: Secondary | ICD-10-CM

## 2020-08-09 LAB — COMPREHENSIVE METABOLIC PANEL
ALT: 22 U/L (ref 0–44)
AST: 17 U/L (ref 15–41)
Albumin: 3.1 g/dL — ABNORMAL LOW (ref 3.5–5.0)
Alkaline Phosphatase: 53 U/L (ref 38–126)
Anion gap: 6 (ref 5–15)
BUN: 20 mg/dL (ref 8–23)
CO2: 27 mmol/L (ref 22–32)
Calcium: 8.5 mg/dL — ABNORMAL LOW (ref 8.9–10.3)
Chloride: 104 mmol/L (ref 98–111)
Creatinine, Ser: 0.8 mg/dL (ref 0.61–1.24)
GFR, Estimated: >60 mL/min (ref >60)
Glucose, Bld: 133 mg/dL — ABNORMAL HIGH (ref 70–99)
Potassium: 3.6 mmol/L (ref 3.5–5.1)
Sodium: 137 mmol/L (ref 135–145)
Total Bilirubin: 0.7 mg/dL (ref 0.3–1.2)
Total Protein: 6.3 g/dL — ABNORMAL LOW (ref 6.5–8.1)

## 2020-08-09 LAB — MAGNESIUM: Magnesium: 2 mg/dL (ref 1.7–2.4)

## 2020-08-09 LAB — CBC WITH DIFFERENTIAL/PLATELET
Abs Immature Granulocytes: 0.04 10*3/uL (ref 0.00–0.07)
Basophils Absolute: 0 10*3/uL (ref 0.0–0.1)
Basophils Relative: 0 %
Eosinophils Absolute: 0 10*3/uL (ref 0.0–0.5)
Eosinophils Relative: 0 %
HCT: 33.9 % — ABNORMAL LOW (ref 39.0–52.0)
Hemoglobin: 11.3 g/dL — ABNORMAL LOW (ref 13.0–17.0)
Immature Granulocytes: 1 %
Lymphocytes Relative: 8 %
Lymphs Abs: 0.6 10*3/uL — ABNORMAL LOW (ref 0.7–4.0)
MCH: 29.1 pg (ref 26.0–34.0)
MCHC: 33.3 g/dL (ref 30.0–36.0)
MCV: 87.4 fL (ref 80.0–100.0)
Monocytes Absolute: 0.4 10*3/uL (ref 0.1–1.0)
Monocytes Relative: 5 %
Neutro Abs: 6.6 10*3/uL (ref 1.7–7.7)
Neutrophils Relative %: 86 %
Platelets: 195 10*3/uL (ref 150–400)
RBC: 3.88 MIL/uL — ABNORMAL LOW (ref 4.22–5.81)
RDW: 15.2 % (ref 11.5–15.5)
WBC: 7.7 10*3/uL (ref 4.0–10.5)
nRBC: 0 % (ref 0.0–0.2)

## 2020-08-09 MED ORDER — SODIUM CHLORIDE 0.9 % IV SOLN
10.0000 mg | Freq: Once | INTRAVENOUS | Status: AC
  Administered 2020-08-09: 10 mg via INTRAVENOUS
  Filled 2020-08-09: qty 10

## 2020-08-09 MED ORDER — HEPARIN SOD (PORK) LOCK FLUSH 100 UNIT/ML IV SOLN
INTRAVENOUS | Status: AC
  Filled 2020-08-09: qty 5

## 2020-08-09 MED ORDER — PALONOSETRON HCL INJECTION 0.25 MG/5ML
0.2500 mg | Freq: Once | INTRAVENOUS | Status: AC
  Administered 2020-08-09: 0.25 mg via INTRAVENOUS
  Filled 2020-08-09: qty 5

## 2020-08-09 MED ORDER — MAGNESIUM SULFATE 2 GM/50ML IV SOLN
2.0000 g | Freq: Once | INTRAVENOUS | Status: AC
Start: 2020-08-09 — End: 2020-08-09
  Administered 2020-08-09: 2 g via INTRAVENOUS
  Filled 2020-08-09: qty 50

## 2020-08-09 MED ORDER — SODIUM CHLORIDE 0.9 % IV SOLN
30.0000 mg/m2 | Freq: Once | INTRAVENOUS | Status: AC
  Administered 2020-08-09: 48 mg via INTRAVENOUS
  Filled 2020-08-09: qty 48

## 2020-08-09 MED ORDER — SODIUM CHLORIDE 0.9% FLUSH
10.0000 mL | INTRAVENOUS | Status: DC | PRN
  Filled 2020-08-09: qty 10

## 2020-08-09 MED ORDER — SODIUM CHLORIDE 0.9 % IV SOLN
150.0000 mg | Freq: Once | INTRAVENOUS | Status: AC
  Administered 2020-08-09: 150 mg via INTRAVENOUS
  Filled 2020-08-09: qty 150

## 2020-08-09 MED ORDER — POTASSIUM CHLORIDE IN NACL 20-0.9 MEQ/L-% IV SOLN
Freq: Once | INTRAVENOUS | Status: AC
  Filled 2020-08-09: qty 1000

## 2020-08-09 MED ORDER — HEPARIN SOD (PORK) LOCK FLUSH 100 UNIT/ML IV SOLN
500.0000 [IU] | Freq: Once | INTRAVENOUS | Status: AC | PRN
  Administered 2020-08-09: 500 [IU]
  Filled 2020-08-09: qty 5

## 2020-08-09 MED ORDER — NYSTATIN 100000 UNIT/ML MT SUSP: 5.0000 mL | Freq: Four times a day (QID) | OROMUCOSAL | 1 refills | Status: DC

## 2020-08-09 MED ORDER — SODIUM CHLORIDE 0.9 % IV SOLN
Freq: Once | INTRAVENOUS | Status: AC
  Filled 2020-08-09: qty 250

## 2020-08-09 NOTE — Progress Notes (Signed)
Hematology/Oncology progress note [facility] [phone] [phone]   Patient Care Team: [name], [name], MD as PCP - General (Internal Medicine) Kaidence Callaway, MD as Consulting Physician (Oncology)  REFERRING PROVIDER: [name], [name], [name]  HISTORY OF PRESENTING ILLNESS:  Gregory Burton is a  80 y.o.  male with PMH listed below was seen in consultation at the request of  [name], [name], MD  for evaluation of lymph node.  Patient moved from [place] to [place] for about 3 weeks. He has noticed a neck knot on the right side of his neck. The knot is sore and makes him to cough and he feels it when he swallows. No swallowing difficulty.  Patient was accompanied by his wife. She reports that patient's previous PCP has done neck sonogram for evaluation.  Patient also had right lower molar tooth extraction done a few weeks before he noticed  He also took a course of antibiotics.  Due to his tooth pain, his appetite has decreased and he has lost weight loss, about 20 pounds, he can not specify the time frame.   Alcoholism History of prostate cancer. S/p Radiation/seed, he follows up with [facility].   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  Interval decrease of the posterior left upper lobe pleural-based nodule  with interval decrease in size of the tiny adjacent satellite nodule.  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  No recurrent tumor in right piriform sinus.  Right lateral pharyngeal lymph node is stable.  No new or recurrent adenopathy.Suspect metastasis.  I recommend patient to obtain PET scan restaging.  I discussed about the plan of re-biopsy of either the paraspinal soft tissue mass versus other more feasible hypermetabolic sites detected on PET scan. Patient was seen by [name] and opted to proceed with palliative radiation as soon as possible.  PET scan cannot be arranged prior to the radiation so the PET scan was canceled. 04/20/2020 -04/25/2020, palliative radiation to thoracic spine  05/10/2020, MRI thoracic spine was obtained for worsening of back pain and left shoulder pain.  Images showed metastatic disease throughout almost the entire T2 with and associated mild superior endplate compression fracture.  Abnormal signal in the anterior, inferior endplate of T1, superior aspect of the T3.  Small metastatic deposit in T12 is also noted. Case was also discussed on multidisciplinary tumor board on 05/25/2020 05/22/2020, PET scan showed new hypermetabolic lytic bone metastasis in the left scapular near the glenoid and in the T2 vertebral body.  Persistent hypermetabolic right lateral retropharyngeal and right level 3 neck lymph node metastasis.  Peripheral left upper lobe pulmonary nodule is mildly decreased with new surrounding mild platelike postradiation changes..  No residual recurrent neoplasm at the site of vomiting in the right piriform sinus  06/07/2020 -06/09/2020 patient underwent palliative radiation to left scapular April 2022 seen by [name]   and flexible laryngoscope examination showed new lesion at the right false vocal cord.  Case was discussed on tumor board on 06/22/2020. [name] recommend additional CT soft tissue neck with contrast for additional  evaluation. 06/29/2020 CT images were reviewed and discussed with patient. There is new malignant lymph node in the right mid neck at the level of the hyoid, this compressing the right jugular vein with thrombus in the vein above and below the lymph node.  Cystic retropharyngeal lymph node on the right is stable.  Progressive soft tissue thickening in the larynx bilaterally suspicious for tumor.  Metastatic disease at the T2 is unchanged.  Metastatic deposit left scapula there is now a pathological fracture of the scapular. CT scan findings were discussed with ENT [name] via secure chat.  [name] feels surgery is not feasible.  The larynx lesion currently does not compromise his airway however further progression may in the future  07/10/2020 Seen by Radonc [name] who would offer salvage radiation to his larynx as well as neck adenopathy. Would like low dose chemotherapy as sensitizer.    Patient was also referred to establish care with Dr. Georgiann Cocker at [facility] for kyphoplasty.  Patient's wife reports that they were never contacted and she called Dr. Georgiann Cocker office with no success.   NGS.  PD-L1-IHC 40%, no targetable mutations.   INTERVAL HISTORY Gregory Burton is a 80 y.o. male who has above history reviewed by me today presents for follow-up of head and neck cancer and lung cancer Patient was accompanied by wife. Patient tolerates concurrent chemotherapy and radiation.  He was seen by covering provider last week. Denies any nausea vomiting diarrhea.  Denies dysphagia or mouth sore.  Appetite is fair, weight is relatively stable.  Review of Systems  Constitutional:  Positive for fatigue. Negative for appetite change, chills, fever and unexpected weight change.  HENT:   Positive for hearing loss and voice change.   Eyes:  Negative for eye problems and icterus.  Respiratory:  Negative for chest tightness, cough and shortness of breath.   Cardiovascular:  Negative for chest pain and  leg swelling.  Gastrointestinal:  Negative for abdominal distention, abdominal pain, blood in stool and nausea.  Endocrine: Negative for hot flashes.  Genitourinary:  Negative for difficulty urinating, dysuria and frequency.   Musculoskeletal:  Positive for arthralgias.  Skin:  Negative for itching and rash.  Neurological:  Negative for extremity weakness, light-headedness and numbness.  Hematological:  Negative for adenopathy. Does not bruise/bleed easily.  Psychiatric/Behavioral:  Negative for confusion.        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   Prostate CA ([place]) 2008   Squamous cell lung cancer ([place]) 06/23/2019    SURGICAL HISTORY: Past Surgical History:  Procedure Laterality Date   BRAIN SURGERY  2012   HERNIA REPAIR     PORTA CATH INSERTION N/A 07/27/2019   Procedure: PORTA CATH INSERTION;  Surgeon: Katha Cabal, MD;  Location: [facility];  Service: Cardiovascular;  Laterality: N/A;    SOCIAL HISTORY: Social History   Socioeconomic History   Marital status: Married    Spouse name: Not on file   Number  of children: Not on file   Years of education: Not on file   Highest education level: Not on file  Occupational History   Not on file  Tobacco Use   Smoking status: Former    Years: 21.00    Pack years: 0.00    Types: Cigarettes    Quit date: 05/11/2008    Years since quitting: 12.2   Smokeless tobacco: Never  Vaping Use   Vaping Use: Never used  Substance and Sexual Activity   Alcohol use: Yes    Comment: 0.5 pint liquor a week   Drug use: Never   Sexual activity: Not Currently  Other Topics Concern   Not on file  Social History Narrative   Lives at home with wife. Wife states he has  some dementia but is able to sign his own consent.   Social Determinants of Health   Financial Resource Strain: Not on file  Food Insecurity: Not on file  Transportation Needs: Not on file  Physical Activity: Not on file  Stress: Not on file  Social Connections: Not on file  Intimate Partner Violence: Not on file    FAMILY HISTORY: History reviewed. No pertinent family history.  ALLERGIES:  is allergic to enalapril.  MEDICATIONS:  Current Outpatient Medications  Medication Sig Dispense Refill   apixaban (ELIQUIS) 2.5 MG TABS tablet Take 1 tablet (2.5 mg total) by mouth 2 (two) times daily. 60 tablet 1   atorvastatin (LIPITOR) 20 MG tablet Take 20 mg by mouth daily.     dexamethasone (DECADRON) 4 MG tablet Take 2 tablets (8 mg total) by mouth daily. Take daily x 3 days starting the day after cisplatin chemotherapy. Take with food. 30 tablet 1   donepezil (ARICEPT) 10 MG tablet Take 10 mg by mouth at bedtime.     dorzolamide-timolol (COSOPT) 22.3-6.8 MG/ML ophthalmic solution Place 1 drop into both eyes 2 (two) times daily.      folic acid (FOLVITE) 1 MG tablet Take 1 mg by mouth daily.     latanoprost (XALATAN) 0.005 % ophthalmic solution Place 1 drop into both eyes at bedtime.      lidocaine-prilocaine (EMLA) cream Apply 1 application topically as needed. Apply small amount to port site approx 1-2 hours prior to appointment. 30 g 1   MAG64 64 MG TBEC TAKE 1 TABLET (64 MG TOTAL) BY MOUTH 2 (TWO) TIMES DAILY. 180 tablet 0   megestrol (MEGACE) 40 MG tablet TAKE 1 TABLET BY MOUTH TWICE A DAY 60 tablet 0   Multiple Vitamins-Minerals (CENTRUM ADULTS PO) Take by mouth.     oxyCODONE (OXY IR/ROXICODONE) 5 MG immediate release tablet Take 1 tablet (5 mg total) by mouth every 4 (four) hours as needed for severe pain. 90 tablet 0   Thiamine HCl (VITAMIN B-1 PO) Take 100 mg by mouth daily.     pantoprazole (PROTONIX) 20 MG tablet Take 1 tablet (20 mg total) by mouth 2 (two) times daily.  (Patient not taking: Reported on 08/09/2020) 90 tablet 0   No current facility-administered medications for this visit.   Facility-Administered Medications Ordered in Other Visits  Medication Dose Route Frequency Provider Last Rate Last Admin   CISplatin (PLATINOL) 48 mg in sodium chloride 0.9 % 250 mL chemo infusion  30 mg/m2 (Order-Specific) Intravenous Once Earlie Server, MD       heparin lock flush 100 unit/mL  500 Units Intracatheter Once PRN Earlie Server, MD       palonosetron (ALOXI) injection 0.25 mg  0.25 mg Intravenous Once Earlie Server, MD       sodium chloride flush (NS) 0.9 % injection 10 mL  10 mL Intravenous PRN Earlie Server, MD   10 mL at 03/01/20 3016   sodium chloride flush (NS) 0.9 % injection 10 mL  10 mL Intracatheter PRN Earlie Server, MD         PHYSICAL EXAMINATION: ECOG PERFORMANCE STATUS: 1 - Symptomatic but completely ambulatory Vitals:   08/09/20 0906  BP: 108/73  Pulse: 87  Resp: 18  Temp: 98 F (36.7 C)   Filed Weights   08/09/20 0906  Weight: 106 lb 6.4 oz (48.3 kg)    Physical Exam Constitutional:      General: He is not in acute distress. HENT:     Head: Normocephalic and atraumatic.  Eyes:     General: No scleral icterus. Neck:     Comments: Palpable right cervical lymphadenopathy  Cardiovascular:     Rate and Rhythm: Normal rate and regular rhythm.     Heart sounds: Normal heart sounds.  Pulmonary:     Effort: Pulmonary effort is normal. No respiratory distress.     Breath sounds: No wheezing.  Abdominal:     General: Bowel sounds are normal. There is no distension.     Palpations: Abdomen is soft.  Musculoskeletal:        General: No deformity.     Cervical back: Normal range of motion and neck supple.     Comments: Left shoulder pain has improved  Skin:    General: Skin is warm and dry.     Findings: No erythema or rash.  Neurological:     Mental Status: He is alert. Mental status is at baseline.     Cranial Nerves: No cranial nerve deficit.      Coordination: Coordination normal.    LABORATORY DATA:  I have reviewed the data as listed Lab Results  Component Value Date   WBC 7.7 08/09/2020   HGB 11.3 (L) 08/09/2020   HCT 33.9 (L) 08/09/2020   MCV 87.4 08/09/2020   PLT 195 08/09/2020   Recent Labs    09/01/19 0751 09/08/19 0832 11/15/19 0818 12/31/19 0927 07/26/20 0837 08/02/20 0855 08/09/20 0846  NA 135 140 141   < > 139 136 137  K 3.9 4.2 4.1   < > 3.9 3.5 3.6  CL 105 107 109   < > 106 101 104  CO2 21* 25 24   < > $R'24 25 27  'Sb$ GLUCOSE 100* 125* 98   < > 114* 129* 133*  BUN $Re'19 23 19   'GGa$ < > $R'16 18 20  'DQ$ CREATININE 0.86 0.98 0.88   < > 0.84 0.76 0.80  CALCIUM 8.7* 9.0 9.2   < > 9.6 8.9 8.5*  GFRNONAA >60 >60 >60   < > >60 >60 >60  GFRAA >60 >60 >60  --   --   --   --   PROT 6.9 6.8 7.0   < > 7.4 6.9 6.3*  ALBUMIN 3.2* 3.2* 3.9   < > 4.1 3.5 3.1*  AST $Re'16 16 17   'NCD$ < > $R'19 17 17  'GH$ ALT $'16 13 19   'M$ < > $R'18 22 22  'RV$ ALKPHOS 55 56 41   < > 61 53 53  BILITOT 0.7 0.5 1.0   < > 1.2 1.0 0.7   < > = values in this interval not displayed.    Iron/TIBC/Ferritin/ %Sat No results found for: IRON,  TIBC, FERRITIN, IRONPCTSAT    RADIOGRAPHIC STUDIES: I have personally reviewed the radiological images as listed and agreed with the findings in the report. No results found. CT SOFT TISSUE NECK W CONTRAST  Result Date: 06/29/2020 CLINICAL DATA:  Current head neck cancer. 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 Worsening hoarseness. EXAM: CT NECK WITH CONTRAST TECHNIQUE: Multidetector CT imaging of the neck was performed using the standard protocol following the bolus administration of intravenous contrast. CONTRAST:  3mL OMNIPAQUE IOHEXOL 300 MG/ML  SOLN COMPARISON:  CT neck 03/31/2020 FINDINGS: Pharynx and larynx: Progressive thickening of the vocal cords bilaterally. This shows irregular enhancement and could be due to tumor versus radiation change. Direct visualization is recommended. Epiglottis normal. Oropharynx  negative for recurrent tumor. Salivary glands: Parotid normal bilaterally. Atrophic submandibular gland bilaterally without acute abnormality. Thyroid: Negative Lymph nodes: 17 mm lymph node in the right mid neck at the level of the hyoid bone is new since the prior study. This has irregular enhancement and is consistent with metastatic disease. This is compressing the right jugular vein with thrombus above and below the tumor Cystic retropharyngeal lymph node on the right 14 mm, unchanged from the prior study. Vascular: Thrombus in the right jugular vein due to compression by malignant lymph node in the right mid neck. Severe atherosclerotic disease in the carotid artery bilaterally which remain patent bilaterally. Atherosclerotic calcification aortic arch. Limited intracranial: Negative Visualized orbits: Chronic fracture right medial orbit unchanged. Mastoids and visualized paranasal sinuses: Negative Skeleton: Lytic destructive lesion T2 vertebral body on the right with mild pathologic fracture unchanged from the prior study. Metastatic disease is left scapula with pathologic fracture. Fracture not seen on prior studies. Upper chest: Lung apices clear bilaterally. Port-A-Cath tip right jugular vein. Other: None IMPRESSION: 1. 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 This is compressing the right jugular vein with thrombus in the vein above and below the lymph node. Cystic retropharyngeal lymph node on the right is stable 2. Progressive soft tissue thickening in the larynx bilaterally suspicious for tumor. Recommend direct visualization to differentiate from radiation change. 3. Metastatic disease at T2 is unchanged 4. Metastatic deposit left scapula. There is now a pathologic fracture of the scapula. Electronically Signed   By: Franchot Gallo M.D.   On: 06/29/2020 16:37   NM PET Image Restage (PS) Skull Base to Thigh  Result Date: 05/23/2020 CLINICAL DATA:  Subsequent treatment  strategy for metastatic 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 biopsy-proven left upper lobe squamous cell lung carcinoma. EXAM: NUCLEAR MEDICINE PET SKULL BASE TO THIGH TECHNIQUE: 6.4 mCi F-18 FDG was injected intravenously. Full-ring PET imaging was performed from the skull base to thigh after the radiotracer. CT data was obtained and used for attenuation correction and anatomic localization. Fasting blood glucose: 63 mg/dl COMPARISON:  05/10/2020 MRI thoracic spine. 03/31/2020 CT neck and chest. 06/07/2019 PET-CT. FINDINGS: Mediastinal blood pool activity: SUV max 2.0 Liver activity: SUV max NA NECK: Persistent enlarged hypermetabolic 1.4 cm short axis diameter right lateral retropharyngeal centrally necrotic node with max SUV 3.5 (series 3/image 20), not substantially changed from 03/31/2020 neck CT. Hypermetabolic 0.8 cm right level 3 neck lymph node with max SUV 4.7 (series 3/image 48), not appreciably changed in size since 03/31/2020 neck CT, decreased from 1.9 cm with max SUV 5.6 on 06/07/2019 PET-CT. No evidence of residual or recurrent hypermetabolism at the site of the primary tumor in the right piriform sinus.  No hypermetabolic left neck lymph nodes. Incidental CT findings: Right internal jugular Port-A-Cath terminates at the cavoatrial junction. CHEST: Peripheral left upper lobe 1.8 x 1.5 cm pulmonary nodule demonstrates low level hypermetabolism with max SUV 2.7 (series 3/image 89), mildly decreased from 2.1 x 1.7 cm on 03/31/2020 chest CT. New patchy bandlike consolidation in the adjacent superior segment left lower lobe compatible with early postradiation change. No enlarged or hypermetabolic axillary, mediastinal or hilar lymph nodes. Incidental CT findings: Coronary atherosclerosis. Atherosclerotic nonaneurysmal thoracic aorta. ABDOMEN/PELVIS: No abnormal hypermetabolic activity within the liver, pancreas, adrenal glands, or spleen. No hypermetabolic lymph nodes in the  abdomen or pelvis. Incidental CT findings: Brachytherapy seeds in the prostate. Marked diffuse colonic diverticulosis. Atherosclerotic nonaneurysmal abdominal aorta. SKELETON: New hypermetabolic lytic mildly expansile 2.6 cm left scapula lesion near the glenoid with max SUV 12.3. New hypermetabolic lytic T2 vertebral lesion with max SUV 4.5. Incidental CT findings: none IMPRESSION: 1. New hypermetabolic lytic bone metastases in the left scapula near the glenoid and in the T2 vertebral body. 2. Persistent hypermetabolic right lateral retropharyngeal and right level 3 neck lymph node metastases. 3. Peripheral left upper lobe pulmonary nodule is mildly decreased since 03/31/2020 chest CT with new surrounding mild bandlike postradiation changes, compatible with treatment response. Low level hypermetabolism within this nodule is nonspecific. Continued chest CT or PET-CT follow-up advised. 4. No residual or recurrent hypermetabolism at the site of the primary tumor in the right para form sinus. 5. Chronic findings include: Aortic Atherosclerosis (ICD10-I70.0). Coronary atherosclerosis. Marked colonic diverticulosis. Electronically Signed   By: Ilona Sorrel M.D.   On: 05/23/2020 09:27        ASSESSMENT & PLAN:  1. 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 Squamous cell carcinoma of left lung ([place])   5. 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 Labs reviewed and discussed with patient.  Proceed with cisplatin 30 mg/metered squared today.  Concurrent radiation per rad onc Nystatin oral rinse for thrush prophylaxis. Continue follow-up with palliative care service.  #Left upper lobe squamous cell carcinoma, stage I Finished SBRT on 02/07/2020.  Monitor disease status. #Right jugular vein thrombus, continue Eliquis 5 mg twice daily. #chronic hypomagnesemia, magnesium is normal today.  Continue slow magnesium 1 tablet twice daily.    All questions were answered. The patient knows to call the clinic with any problems questions or concerns.   Return of visit: lab/MD/cisplatin  Earlie Server, MD, PhD Hematology Oncology [facility] Pager- [phone] 08/09/2020

## 2020-08-09 NOTE — Progress Notes (Signed)
Nutrition Follow-up:  Patient with metastatic head and neck cancer, bone mets in left scapular, T2 vertebral body.  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  Patient receiving radiation and chemotherapy.    Met with wife while patient is in infusion due to patient's memory.  Wife reports that patient is eating 3 meals per day.  Drinking about 2 ensure plus per day.  Says that MD found thrush in mouth today and will treat.  Wife denies patient having trouble swallowing foods    Medications: reviewed  Labs: reviewed  Anthropometrics:   Weight 106 lb 6.4 oz today  107 lb on 6/8  109 lb on 6/1 112 lb on 4/1 118 lb on 04/04/20  NUTRITION DIAGNOSIS: Inadequate oral intake continues   INTERVENTION:  Recommend increase ensure plus to 3-4 times per day with weight loss Continue 3 meals per day with shake in between.      MONITORING, EVALUATION, GOAL: weight trends, intake   NEXT VISIT: Wed, June 22 speak with wife  Gregory Burton, [place], [place] Registered Dietitian [phone] (mobile)

## 2020-08-09 NOTE — Progress Notes (Signed)
Patient here for follow up. Pt reports that he has not started taking pantoprazole since he has not had stomach issues.

## 2020-08-09 NOTE — Patient Instructions (Signed)
[facility]  Discharge Instructions: Thank you for choosing [facility] to provide your oncology and hematology care.  If you have a lab appointment with the [facility], please go directly to the [facility] and check in at the registration area.  Wear comfortable clothing and clothing appropriate for easy access to any Portacath or PICC line.   We strive to give you quality time with your provider. You may need to reschedule your appointment if you arrive late (15 or more minutes).  Arriving late affects you and other patients whose appointments are after yours.  Also, if you miss three or more appointments without notifying the office, you may be dismissed from the clinic at the provider's discretion.      For prescription refill requests, have your pharmacy contact our office and allow 72 hours for refills to be completed.    Today you received the following chemotherapy and/or immunotherapy agents - cisplatin      To help prevent nausea and vomiting after your treatment, we encourage you to take your nausea medication as directed.  BELOW ARE SYMPTOMS THAT SHOULD BE REPORTED IMMEDIATELY: *FEVER GREATER THAN 100.4 F (38 C) OR HIGHER *CHILLS OR SWEATING *NAUSEA AND VOMITING THAT IS NOT CONTROLLED WITH YOUR NAUSEA MEDICATION *UNUSUAL SHORTNESS OF BREATH *UNUSUAL BRUISING OR BLEEDING *URINARY PROBLEMS (pain or burning when urinating, or frequent urination) *BOWEL PROBLEMS (unusual diarrhea, constipation, pain near the anus) TENDERNESS IN MOUTH AND THROAT WITH OR WITHOUT PRESENCE OF ULCERS (sore throat, sores in mouth, or a toothache) UNUSUAL RASH, SWELLING OR PAIN  UNUSUAL VAGINAL DISCHARGE OR ITCHING   Items with * indicate a potential emergency and should be followed up as soon as possible or go to the Emergency Department if any problems should occur.  Please show the CHEMOTHERAPY ALERT CARD or IMMUNOTHERAPY ALERT CARD at check-in  to the Emergency Department and triage nurse.  Should you have questions after your visit or need to cancel or reschedule your appointment, please contact [facility]  [phone] and follow the prompts.  Office hours are 8:00 a.m. to 4:30 p.m. Monday - Friday. Please note that voicemails left after 4:00 p.m. may not be returned until the following business day.  We are closed weekends and major holidays. You have access to a nurse at all times for urgent questions. Please call the main number to the clinic [phone] and follow the prompts.  For any non-urgent questions, you may also contact your provider using MyChart. We now offer e-Visits for anyone 54 and older to request care online for non-urgent symptoms. For details visit [email or web address].   Also download the MyChart app! Go to the app store, search "MyChart", open the app, select [facility], and log in with your MyChart username and password.  Due to Covid, a mask is required upon entering the hospital/clinic. If you do not have a mask, one will be given to you upon arrival. For doctor visits, patients may have 1 support person aged 8 or older with them. For treatment visits, patients cannot have anyone with them due to current Covid guidelines and our immunocompromised population.   Cisplatin injection What is this medication? CISPLATIN (SIS pla tin) is a chemotherapy drug. It targets fast dividing cells, like cancer cells, and causes these cells to die. This medicine is used totreat many types of cancer like bladder, ovarian, and testicular cancers. This medicine may be used for other purposes; ask your health  care provider orpharmacist if you have questions. COMMON BRAND NAME(S): Platinol, Platinol -AQ What should I tell my care team before I take this medication? They need to know if you have any of these conditions: eye disease, vision problems hearing problems kidney disease low  blood counts, like white cells, platelets, or red blood cells tingling of the fingers or toes, or other nerve disorder an unusual or allergic reaction to cisplatin, carboplatin, oxaliplatin, other medicines, foods, dyes, or preservatives pregnant or trying to get pregnant breast-feeding How should I use this medication? This drug is given as an infusion into a vein. It is administered in a hospitalor clinic by a specially trained health care professional. Talk to your pediatrician regarding the use of this medicine in children.Special care may be needed. Overdosage: If you think you have taken too much of this medicine contact apoison control center or emergency room at once. NOTE: This medicine is only for you. Do not share this medicine with others. What if I miss a dose? It is important not to miss a dose. Call your doctor or health careprofessional if you are unable to keep an appointment. What may interact with this medication? This medicine may interact with the following medications: foscarnet certain antibiotics like amikacin, gentamicin, neomycin, polymyxin B, streptomycin, tobramycin, vancomycin This list may not describe all possible interactions. Give your health care provider a list of all the medicines, herbs, non-prescription drugs, or dietary supplements you use. Also tell them if you smoke, drink alcohol, or use illegaldrugs. Some items may interact with your medicine. What should I watch for while using this medication? Your condition will be monitored carefully while you are receiving this medicine. You will need important blood work done while you are taking thismedicine. This drug may make you feel generally unwell. This is not uncommon, as chemotherapy can affect healthy cells as well as cancer cells. Report any side effects. Continue your course of treatment even though you feel ill unless yourdoctor tells you to stop. This medicine may increase your risk of getting an  infection. Call your healthcare professional for advice if you get a fever, chills, or sore throat, or other symptoms of a cold or flu. Do not treat yourself. Try to avoid beingaround people who are sick. Avoid taking medicines that contain aspirin, acetaminophen, ibuprofen, naproxen, or ketoprofen unless instructed by your healthcare professional.These medicines may hide a fever. This medicine may increase your risk to bruise or bleed. Call your doctor orhealth care professional if you notice any unusual bleeding. Be careful brushing and flossing your teeth or using a toothpick because you may get an infection or bleed more easily. If you have any dental work done,tell your dentist you are receiving this medicine. Do not become pregnant while taking this medicine or for 14 months after stopping it. Women should inform their healthcare professional if they wish to become pregnant or think they might be pregnant. Men should not father a child while taking this medicine and for 11 months after stopping it. There is potential for serious side effects to an unborn child. Talk to your healthcareprofessional for more information. Do not breast-feed an infant while taking this medicine. This medicine has caused ovarian failure in some women. This medicine may make it more difficult to get pregnant. Talk to your healthcare professional if [place] concerned about your fertility. This medicine has caused decreased sperm counts in some men. This may make it more difficult to father a child. Talk to your healthcare  professional if youare concerned about your fertility. Drink fluids as directed while you are taking this medicine. This will helpprotect your kidneys. Call your doctor or health care professional if you get diarrhea. Do not treatyourself. What side effects may I notice from receiving this medication? Side effects that you should report to your doctor or health care professionalas soon as  possible: allergic reactions like skin rash, itching or hives, swelling of the face, lips, or tongue blurred vision changes in vision decreased hearing or ringing of the ears nausea, vomiting pain, redness, or irritation at site where injected pain, tingling, numbness in the hands or feet signs and symptoms of bleeding such as bloody or black, tarry stools; red or dark brown urine; spitting up blood or brown material that looks like coffee grounds; red spots on the skin; unusual bruising or bleeding from the eyes, gums, or nose signs and symptoms of infection like fever; chills; cough; sore throat; pain or trouble passing urine signs and symptoms of kidney injury like trouble passing urine or change in the amount of urine signs and symptoms of low red blood cells or anemia such as unusually weak or tired; feeling faint or lightheaded; falls; breathing problems Side effects that usually do not require medical attention (report to yourdoctor or health care professional if they continue or are bothersome): loss of appetite mouth sores muscle cramps This list may not describe all possible side effects. Call your doctor for medical advice about side effects. You may report side effects to FDA at1-800-FDA-1088. Where should I keep my medication? This drug is given in a hospital or clinic and will not be stored at home. NOTE: This sheet is a summary. It may not cover all possible information. If you have questions about this medicine, talk to your doctor, pharmacist, orhealth care provider.  2022 [employer] (2018-02-06 15:59:17)

## 2020-08-10 ENCOUNTER — Inpatient Hospital Stay: Payer: Medicare Other

## 2020-08-10 ENCOUNTER — Ambulatory Visit
Admission: RE | Admit: 2020-08-10 | Discharge: 2020-08-10 | Disposition: A | Discharge status: Home / Self Care | Payer: Medicare Other | Source: Ambulatory Visit | Attending: Radiation Oncology | Admitting: Radiation Oncology

## 2020-08-10 DIAGNOSIS — C01 Malignant neoplasm of base of tongue: Secondary | ICD-10-CM | POA: Diagnosis not present

## 2020-08-11 ENCOUNTER — Ambulatory Visit
Admission: RE | Admit: 2020-08-11 | Discharge: 2020-08-11 | Disposition: A | Discharge status: Home / Self Care | Payer: Medicare Other | Source: Ambulatory Visit | Attending: Radiation Oncology | Admitting: Radiation Oncology

## 2020-08-11 ENCOUNTER — Inpatient Hospital Stay: Payer: Medicare Other

## 2020-08-11 DIAGNOSIS — C01 Malignant neoplasm of base of tongue: Secondary | ICD-10-CM | POA: Diagnosis not present

## 2020-08-14 ENCOUNTER — Inpatient Hospital Stay: Payer: Medicare Other

## 2020-08-14 ENCOUNTER — Ambulatory Visit
Admission: RE | Admit: 2020-08-14 | Discharge: 2020-08-14 | Disposition: A | Discharge status: Home / Self Care | Payer: Medicare Other | Source: Ambulatory Visit | Attending: Radiation Oncology | Admitting: Radiation Oncology

## 2020-08-14 DIAGNOSIS — C01 Malignant neoplasm of base of tongue: Secondary | ICD-10-CM | POA: Diagnosis not present

## 2020-08-15 ENCOUNTER — Inpatient Hospital Stay: Payer: Medicare Other

## 2020-08-15 ENCOUNTER — Ambulatory Visit
Admission: RE | Admit: 2020-08-15 | Discharge: 2020-08-15 | Disposition: A | Discharge status: Home / Self Care | Payer: Medicare Other | Source: Ambulatory Visit | Attending: Radiation Oncology | Admitting: Radiation Oncology

## 2020-08-15 DIAGNOSIS — C01 Malignant neoplasm of base of tongue: Secondary | ICD-10-CM | POA: Diagnosis not present

## 2020-08-16 ENCOUNTER — Inpatient Hospital Stay (HOSPITAL_BASED_OUTPATIENT_CLINIC_OR_DEPARTMENT_OTHER): Payer: Medicare Other | Admitting: Oncology

## 2020-08-16 ENCOUNTER — Inpatient Hospital Stay: Payer: Medicare Other

## 2020-08-16 ENCOUNTER — Ambulatory Visit
Admission: RE | Admit: 2020-08-16 | Discharge: 2020-08-16 | Disposition: A | Discharge status: Home / Self Care | Payer: Medicare Other | Source: Ambulatory Visit | Attending: Radiation Oncology | Admitting: Radiation Oncology

## 2020-08-16 ENCOUNTER — Other Ambulatory Visit: Payer: Self-pay

## 2020-08-16 ENCOUNTER — Encounter: Payer: Self-pay | Admitting: Oncology

## 2020-08-16 VITALS — BP 151/99 | HR 68 | Temp 97.8°F | Resp 20

## 2020-08-16 VITALS — BP 117/79 | HR 71 | Temp 98.0°F | Resp 16 | Wt 105.5 lb

## 2020-08-16 DIAGNOSIS — C3492 Malignant neoplasm of unspecified part of left bronchus or lung: Secondary | ICD-10-CM | POA: Diagnosis not present

## 2020-08-16 DIAGNOSIS — C329 Malignant neoplasm of larynx, unspecified: Secondary | ICD-10-CM

## 2020-08-16 DIAGNOSIS — Z5111 Encounter for antineoplastic chemotherapy: Secondary | ICD-10-CM

## 2020-08-16 DIAGNOSIS — C109 Malignant neoplasm of oropharynx, unspecified: Secondary | ICD-10-CM | POA: Diagnosis not present

## 2020-08-16 DIAGNOSIS — R634 Abnormal weight loss: Secondary | ICD-10-CM

## 2020-08-16 DIAGNOSIS — F1027 Alcohol dependence with alcohol-induced persisting dementia: Secondary | ICD-10-CM | POA: Diagnosis not present

## 2020-08-16 DIAGNOSIS — C01 Malignant neoplasm of base of tongue: Secondary | ICD-10-CM | POA: Diagnosis not present

## 2020-08-16 LAB — COMPREHENSIVE METABOLIC PANEL
ALT: 23 U/L (ref 0–44)
AST: 17 U/L (ref 15–41)
Albumin: 3 g/dL — ABNORMAL LOW (ref 3.5–5.0)
Alkaline Phosphatase: 53 U/L (ref 38–126)
Anion gap: 7 (ref 5–15)
BUN: 17 mg/dL (ref 8–23)
CO2: 26 mmol/L (ref 22–32)
Calcium: 8.6 mg/dL — ABNORMAL LOW (ref 8.9–10.3)
Chloride: 105 mmol/L (ref 98–111)
Creatinine, Ser: 0.78 mg/dL (ref 0.61–1.24)
GFR, Estimated: >60 mL/min (ref >60)
Glucose, Bld: 111 mg/dL — ABNORMAL HIGH (ref 70–99)
Potassium: 4 mmol/L (ref 3.5–5.1)
Sodium: 138 mmol/L (ref 135–145)
Total Bilirubin: 0.8 mg/dL (ref 0.3–1.2)
Total Protein: 6.3 g/dL — ABNORMAL LOW (ref 6.5–8.1)

## 2020-08-16 LAB — CBC WITH DIFFERENTIAL/PLATELET
Abs Immature Granulocytes: 0.03 10*3/uL (ref 0.00–0.07)
Basophils Absolute: 0 10*3/uL (ref 0.0–0.1)
Basophils Relative: 0 %
Eosinophils Absolute: 0 10*3/uL (ref 0.0–0.5)
Eosinophils Relative: 0 %
HCT: 32.3 % — ABNORMAL LOW (ref 39.0–52.0)
Hemoglobin: 10.7 g/dL — ABNORMAL LOW (ref 13.0–17.0)
Immature Granulocytes: 1 %
Lymphocytes Relative: 10 %
Lymphs Abs: 0.6 10*3/uL — ABNORMAL LOW (ref 0.7–4.0)
MCH: 29 pg (ref 26.0–34.0)
MCHC: 33.1 g/dL (ref 30.0–36.0)
MCV: 87.5 fL (ref 80.0–100.0)
Monocytes Absolute: 0.3 10*3/uL (ref 0.1–1.0)
Monocytes Relative: 5 %
Neutro Abs: 5 10*3/uL (ref 1.7–7.7)
Neutrophils Relative %: 84 %
Platelets: 165 10*3/uL (ref 150–400)
RBC: 3.69 MIL/uL — ABNORMAL LOW (ref 4.22–5.81)
RDW: 15.4 % (ref 11.5–15.5)
WBC: 5.9 10*3/uL (ref 4.0–10.5)
nRBC: 0 % (ref 0.0–0.2)

## 2020-08-16 LAB — MAGNESIUM: Magnesium: 1.9 mg/dL (ref 1.7–2.4)

## 2020-08-16 MED ORDER — PALONOSETRON HCL INJECTION 0.25 MG/5ML
0.2500 mg | Freq: Once | INTRAVENOUS | Status: AC
  Administered 2020-08-16: 0.25 mg via INTRAVENOUS
  Filled 2020-08-16: qty 5

## 2020-08-16 MED ORDER — SODIUM CHLORIDE 0.9 % IV SOLN
10.0000 mg | Freq: Once | INTRAVENOUS | Status: AC
  Administered 2020-08-16: 10 mg via INTRAVENOUS
  Filled 2020-08-16: qty 10

## 2020-08-16 MED ORDER — SODIUM CHLORIDE 0.9% FLUSH
10.0000 mL | Freq: Once | INTRAVENOUS | Status: AC
  Administered 2020-08-16: 10 mL via INTRAVENOUS
  Filled 2020-08-16: qty 10

## 2020-08-16 MED ORDER — SODIUM CHLORIDE 0.9 % IV SOLN
150.0000 mg | Freq: Once | INTRAVENOUS | Status: AC
  Administered 2020-08-16: 150 mg via INTRAVENOUS
  Filled 2020-08-16: qty 150

## 2020-08-16 MED ORDER — HEPARIN SOD (PORK) LOCK FLUSH 100 UNIT/ML IV SOLN
500.0000 [IU] | Freq: Once | INTRAVENOUS | Status: AC
  Administered 2020-08-16: 500 [IU] via INTRAVENOUS
  Filled 2020-08-16: qty 5

## 2020-08-16 MED ORDER — SUCRALFATE 1 GM/10ML PO SUSP: 1.0000 g | Freq: Three times a day (TID) | ORAL | 0 refills | Status: DC

## 2020-08-16 MED ORDER — HEPARIN SOD (PORK) LOCK FLUSH 100 UNIT/ML IV SOLN
INTRAVENOUS | Status: AC
  Filled 2020-08-16: qty 5

## 2020-08-16 MED ORDER — SODIUM CHLORIDE 0.9 % IV SOLN
Freq: Once | INTRAVENOUS | Status: AC
Start: 2020-08-16 — End: 2020-08-16
  Filled 2020-08-16: qty 250

## 2020-08-16 MED ORDER — MAGNESIUM SULFATE 2 GM/50ML IV SOLN
2.0000 g | Freq: Once | INTRAVENOUS | Status: AC
  Administered 2020-08-16: 2 g via INTRAVENOUS
  Filled 2020-08-16: qty 50

## 2020-08-16 MED ORDER — SODIUM CHLORIDE 0.9 % IV SOLN
30.0000 mg/m2 | Freq: Once | INTRAVENOUS | Status: AC
  Administered 2020-08-16: 48 mg via INTRAVENOUS
  Filled 2020-08-16: qty 48

## 2020-08-16 MED ORDER — POTASSIUM CHLORIDE IN NACL 20-0.9 MEQ/L-% IV SOLN
Freq: Once | INTRAVENOUS | Status: AC
Start: 2020-08-16 — End: 2020-08-16
  Filled 2020-08-16: qty 1000

## 2020-08-16 NOTE — Progress Notes (Signed)
Patient still feeling very weak.  The Nystatin mouthwash was helping his symptoms but was advised by Dr. Baruch Gouty advised him to swallow not spit mouthwash.  After swallowing the mouthwash once his symptom of sore throat and feeling like something is stuck in his throat got worse.

## 2020-08-16 NOTE — Progress Notes (Signed)
Hematology/Oncology progress note [facility] [phone] [phone]   Patient Care Team: [name], [name], MD as PCP - General (Internal Medicine) , , MD as Consulting Physician (Oncology)  REFERRING PROVIDER: [name], [name], [name]  HISTORY OF PRESENTING ILLNESS:  Gregory Burton is a  80 y.o.  male with PMH listed below was seen in consultation at the request of  [name], [name], MD  for evaluation of lymph node.  Patient moved from [place] to [place] for about 3 weeks. He has noticed a neck knot on the right side of his neck. The knot is sore and makes him to cough and he feels it when he swallows. No swallowing difficulty.  Patient was accompanied by his wife. She reports that patient's previous PCP has done neck sonogram for evaluation.  Patient also had right lower molar tooth extraction done a few weeks before he noticed  He also took a course of antibiotics.  Due to his tooth pain, his appetite has decreased and he has lost weight loss, about 20 pounds, he can not specify the time frame.   Alcoholism History of prostate cancer. S/p Radiation/seed, he follows up with [facility].   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  Interval decrease of the posterior left upper lobe pleural-based nodule  with interval decrease in size of the tiny adjacent satellite nodule.  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  No recurrent tumor in right piriform sinus.  Right lateral pharyngeal lymph node is stable.  No new or recurrent adenopathy.Suspect metastasis.  I recommend patient to obtain PET scan restaging.  I discussed about the plan of re-biopsy of either the paraspinal soft tissue mass versus other more feasible hypermetabolic sites detected on PET scan. Patient was seen by [name] and opted to proceed with palliative radiation as soon as possible.  PET scan cannot be arranged prior to the radiation so the PET scan was canceled. 04/20/2020 -04/25/2020, palliative radiation to thoracic spine  05/10/2020, MRI thoracic spine was obtained for worsening of back pain and left shoulder pain.  Images showed metastatic disease throughout almost the entire T2 with and associated mild superior endplate compression fracture.  Abnormal signal in the anterior, inferior endplate of T1, superior aspect of the T3.  Small metastatic deposit in T12 is also noted. Case was also discussed on multidisciplinary tumor board on 05/25/2020 05/22/2020, PET scan showed new hypermetabolic lytic bone metastasis in the left scapular near the glenoid and in the T2 vertebral body.  Persistent hypermetabolic right lateral retropharyngeal and right level 3 neck lymph node metastasis.  Peripheral left upper lobe pulmonary nodule is mildly decreased with new surrounding mild platelike postradiation changes..  No residual recurrent neoplasm at the site of vomiting in the right piriform sinus  06/07/2020 -06/09/2020 patient underwent palliative radiation to left scapular April 2022 seen by [name]   and flexible laryngoscope examination showed new lesion at the right false vocal cord.  Case was discussed on tumor board on 06/22/2020. [name] recommend additional CT soft tissue neck with contrast for additional  evaluation. 06/29/2020 CT images were reviewed and discussed with patient. There is new malignant lymph node in the right mid neck at the level of the hyoid, this compressing the right jugular vein with thrombus in the vein above and below the lymph node.  Cystic retropharyngeal lymph node on the right is stable.  Progressive soft tissue thickening in the larynx bilaterally suspicious for tumor.  Metastatic disease at the T2 is unchanged.  Metastatic deposit left scapula there is now a pathological fracture of the scapular. CT scan findings were discussed with ENT [name] via secure chat.  [name] feels surgery is not feasible.  The larynx lesion currently does not compromise his airway however further progression may in the future  07/10/2020 Seen by Radonc [name] who would offer salvage radiation to his larynx as well as neck adenopathy. Would like low dose chemotherapy as sensitizer.    Patient was also referred to establish care with [name] at [facility] for kyphoplasty.  Patient's wife reports that they were never contacted and she called [name] office with no success.   NGS.  PD-L1-IHC 40%, no targetable mutations.   INTERVAL HISTORY Gregory Burton is a 80 y.o. male who has above history reviewed by me today presents for follow-up of head and neck cancer and lung cancer Patient was accompanied by wife. Patient tolerates palliative concurrent chemotherapy and radiation.  Feels weaker, appetite is fair, and his weight is stable. . Denies any nausea vomiting diarrhea.    He reports dry throat and odynophagia after swallowing nystatin    Review of Systems  Constitutional:  Positive for fatigue. Negative for appetite change, chills, fever and unexpected weight change.  HENT:   Positive for hearing loss and voice change.        Odynophagia  Eyes:  Negative for eye problems and icterus.  Respiratory:  Negative for chest tightness, cough and shortness of breath.    Cardiovascular:  Negative for chest pain and leg swelling.  Gastrointestinal:  Negative for abdominal distention, abdominal pain, blood in stool and nausea.  Endocrine: Negative for hot flashes.  Genitourinary:  Negative for difficulty urinating, dysuria and frequency.   Musculoskeletal:  Positive for arthralgias.  Skin:  Negative for itching and rash.  Neurological:  Negative for extremity weakness, light-headedness and numbness.  Hematological:  Negative for adenopathy. Does not bruise/bleed easily.  Psychiatric/Behavioral:  Negative for confusion.        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   Prostate CA (HCC) 2008   Squamous cell lung cancer (HCC) 06/23/2019    SURGICAL HISTORY: Past Surgical History:  Procedure Laterality Date   BRAIN SURGERY  2012   HERNIA REPAIR     PORTA CATH INSERTION N/A 07/27/2019   Procedure: PORTA CATH INSERTION;  Surgeon: Schnier, Gregory G, MD;  Location: [facility] INVASIVE CV LAB;  Service: Cardiovascular;  Laterality: N/A;    SOCIAL HISTORY: Social History   Socioeconomic History   Marital status: Married      Spouse name: Not on file   Number of children: Not on file   Years of education: Not on file   Highest education level: Not on file  Occupational History   Not on file  Tobacco Use   Smoking status: Former    Years: 21.00    Pack years: 0.00    Types: Cigarettes    Quit date: 05/11/2008    Years since quitting: 12.2   Smokeless tobacco: Never  Vaping Use   Vaping Use: Never used  Substance and Sexual Activity   Alcohol use: Yes    Comment: 0.5 pint liquor a week   Drug use: Never   Sexual activity: Not Currently  Other Topics Concern   Not on file  Social History Narrative    Lives at home with wife. Wife states he has some dementia but is able to sign his own consent.   Social Determinants of Health   Financial Resource Strain: Not on file  Food Insecurity: Not on file  Transportation Needs: Not on file  Physical Activity: Not on file  Stress: Not on file  Social Connections: Not on file  Intimate Partner Violence: Not on file    FAMILY HISTORY: History reviewed. No pertinent family history.  ALLERGIES:  is allergic to enalapril.  MEDICATIONS:  Current Outpatient Medications  Medication Sig Dispense Refill   apixaban (ELIQUIS) 2.5 MG TABS tablet Take 1 tablet (2.5 mg total) by mouth 2 (two) times daily. 60 tablet 1   atorvastatin (LIPITOR) 20 MG tablet Take 20 mg by mouth daily.     dexamethasone (DECADRON) 4 MG tablet Take 2 tablets (8 mg total) by mouth daily. Take daily x 3 days starting the day after cisplatin chemotherapy. Take with food. 30 tablet 1   donepezil (ARICEPT) 10 MG tablet Take 10 mg by mouth at bedtime.     dorzolamide-timolol (COSOPT) 22.3-6.8 MG/ML ophthalmic solution Place 1 drop into both eyes 2 (two) times daily.      folic acid (FOLVITE) 1 MG tablet Take 1 mg by mouth daily.     latanoprost (XALATAN) 0.005 % ophthalmic solution Place 1 drop into both eyes at bedtime.      lidocaine-prilocaine (EMLA) cream Apply 1 application topically as needed. Apply small amount to port site approx 1-2 hours prior to appointment. 30 g 1   MAG64 64 MG TBEC TAKE 1 TABLET (64 MG TOTAL) BY MOUTH 2 (TWO) TIMES DAILY. 180 tablet 0   megestrol (MEGACE) 40 MG tablet TAKE 1 TABLET BY MOUTH TWICE A DAY 60 tablet 0   Multiple Vitamins-Minerals (CENTRUM ADULTS PO) Take by mouth.     nystatin (MYCOSTATIN) 100000 UNIT/ML suspension Take 5 mLs (500,000 Units total) by mouth 4 (four) times daily. 473 mL 1   oxyCODONE (OXY IR/ROXICODONE) 5 MG immediate release tablet Take 1 tablet (5 mg total) by mouth every 4 (four) hours as needed for severe pain. 90 tablet  0   sucralfate (CARAFATE) 1 GM/10ML suspension Take 10 mLs (1 g total) by mouth 4 (four) times daily -  with meals and at bedtime. 420 mL 0   Thiamine HCl (VITAMIN B-1 PO) Take 100 mg by mouth daily.     pantoprazole (PROTONIX) 20 MG tablet Take 1 tablet (20 mg total) by mouth 2 (two) times daily. (Patient not taking: No sig reported) 90 tablet 0   No current facility-administered medications for this visit.   Facility-Administered Medications Ordered in Other Visits  Medication Dose Route Frequency Provider   Last Rate Last Admin   sodium chloride flush (NS) 0.9 % injection 10 mL  10 mL Intravenous PRN Earlie Server, MD   10 mL at 03/01/20 2694     PHYSICAL EXAMINATION: ECOG PERFORMANCE STATUS: 1 - Symptomatic but completely ambulatory Vitals:   08/16/20 0908  BP: 117/79  Pulse: 71  Resp: 16  Temp: 98 F (36.7 C)   Filed Weights   08/16/20 0908  Weight: 105 lb 8 oz (47.9 kg)    Physical Exam Constitutional:      General: He is not in acute distress. HENT:     Head: Normocephalic and atraumatic.  Eyes:     General: No scleral icterus. Neck:     Comments: Palpable right cervical lymphadenopathy  Cardiovascular:     Rate and Rhythm: Normal rate and regular rhythm.     Heart sounds: Normal heart sounds.  Pulmonary:     Effort: Pulmonary effort is normal. No respiratory distress.     Breath sounds: No wheezing.  Abdominal:     General: Bowel sounds are normal. There is no distension.     Palpations: Abdomen is soft.  Musculoskeletal:        General: No deformity.     Cervical back: Normal range of motion and neck supple.     Comments: Left shoulder pain has improved  Skin:    General: Skin is warm and dry.     Findings: No erythema or rash.  Neurological:     Mental Status: He is alert. Mental status is at baseline.     Cranial Nerves: No cranial nerve deficit.     Coordination: Coordination normal.    LABORATORY DATA:  I have reviewed the data as listed Lab Results   Component Value Date   WBC 5.9 08/16/2020   HGB 10.7 (L) 08/16/2020   HCT 32.3 (L) 08/16/2020   MCV 87.5 08/16/2020   PLT 165 08/16/2020   Recent Labs    09/01/19 0751 09/08/19 0832 11/15/19 0818 12/31/19 0927 08/02/20 0855 08/09/20 0846 08/16/20 0833  NA 135 140 141   < > 136 137 138  K 3.9 4.2 4.1   < > 3.5 3.6 4.0  CL 105 107 109   < > 101 104 105  CO2 21* 25 24   < > _0 GLUCOSE 100* 125* 98   < > 129* 133* 111*  BUN _1 < > _2 CREATININE 0.86 0.98 0.88   < > 0.76 0.80 0.78  CALCIUM 8.7* 9.0 9.2   < > 8.9 8.5* 8.6*  GFRNONAA >60 >60 >60   < > >60 >60 >60  GFRAA >60 >60 >60  --   --   --   --   PROT 6.9 6.8 7.0   < > 6.9 6.3* 6.3*  ALBUMIN 3.2* 3.2* 3.9   < > 3.5 3.1* 3.0*  AST _3 < > _4 ALT _5 < > _6 ALKPHOS 55 56 41   < > 53 53 53  BILITOT 0.7 0.5 1.0   < > 1.0 0.7 0.8   < > = values in this interval not displayed.    Iron/TIBC/Ferritin/ %Sat No results found for: IRON, TIBC, FERRITIN, IRONPCTSAT    RADIOGRAPHIC STUDIES: I have personally reviewed the radiological images as listed and agreed with the findings in the report. No results found. CT SOFT  TISSUE NECK W CONTRAST  Result Date: 06/29/2020 CLINICAL DATA:  Current head neck cancer. 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 Worsening hoarseness. EXAM: CT NECK WITH CONTRAST TECHNIQUE: Multidetector CT imaging of the neck was performed using the standard protocol following the bolus administration of intravenous contrast. CONTRAST:  70m OMNIPAQUE IOHEXOL 300 MG/ML  SOLN COMPARISON:  CT neck 03/31/2020 FINDINGS: Pharynx and larynx: Progressive thickening of the vocal cords bilaterally. This shows irregular enhancement and could be due to tumor versus radiation change. Direct visualization is recommended. Epiglottis normal. Oropharynx negative for recurrent tumor. Salivary glands: Parotid normal bilaterally. Atrophic submandibular gland  bilaterally without acute abnormality. Thyroid: Negative Lymph nodes: 17 mm lymph node in the right mid neck at the level of the hyoid bone is new since the prior study. This has irregular enhancement and is consistent with metastatic disease. This is compressing the right jugular vein with thrombus above and below the tumor Cystic retropharyngeal lymph node on the right 14 mm, unchanged from the prior study. Vascular: Thrombus in the right jugular vein due to compression by malignant lymph node in the right mid neck. Severe atherosclerotic disease in the carotid artery bilaterally which remain patent bilaterally. Atherosclerotic calcification aortic arch. Limited intracranial: Negative Visualized orbits: Chronic fracture right medial orbit unchanged. Mastoids and visualized paranasal sinuses: Negative Skeleton: Lytic destructive lesion T2 vertebral body on the right with mild pathologic fracture unchanged from the prior study. Metastatic disease is left scapula with pathologic fracture. Fracture not seen on prior studies. Upper chest: Lung apices clear bilaterally. Port-A-Cath tip right jugular vein. Other: None IMPRESSION: 1. 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 This is compressing the right jugular vein with thrombus in the vein above and below the lymph node. Cystic retropharyngeal lymph node on the right is stable 2. Progressive soft tissue thickening in the larynx bilaterally suspicious for tumor. Recommend direct visualization to differentiate from radiation change. 3. Metastatic disease at T2 is unchanged 4. Metastatic deposit left scapula. There is now a pathologic fracture of the scapula. Electronically Signed   By: CFranchot GalloM.D.   On: 06/29/2020 16:37   NM PET Image Restage (PS) Skull Base to Thigh  Result Date: 05/23/2020 CLINICAL DATA:  Subsequent treatment strategy for metastatic 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  biopsy-proven left upper lobe squamous cell lung carcinoma. EXAM: NUCLEAR MEDICINE PET SKULL BASE TO THIGH TECHNIQUE: 6.4 mCi F-18 FDG was injected intravenously. Full-ring PET imaging was performed from the skull base to thigh after the radiotracer. CT data was obtained and used for attenuation correction and anatomic localization. Fasting blood glucose: 63 mg/dl COMPARISON:  05/10/2020 MRI thoracic spine. 03/31/2020 CT neck and chest. 06/07/2019 PET-CT. FINDINGS: Mediastinal blood pool activity: SUV max 2.0 Liver activity: SUV max NA NECK: Persistent enlarged hypermetabolic 1.4 cm short axis diameter right lateral retropharyngeal centrally necrotic node with max SUV 3.5 (series 3/image 20), not substantially changed from 03/31/2020 neck CT. Hypermetabolic 0.8 cm right level 3 neck lymph node with max SUV 4.7 (series 3/image 48), not appreciably changed in size since 03/31/2020 neck CT, decreased from 1.9 cm with max SUV 5.6 on 06/07/2019 PET-CT. No evidence of residual or recurrent hypermetabolism at the site of the primary tumor in the right piriform sinus. No hypermetabolic left neck lymph nodes. Incidental CT findings: Right internal jugular Port-A-Cath terminates at the cavoatrial junction. CHEST: Peripheral left upper lobe 1.8 x 1.5 cm pulmonary nodule demonstrates  low level hypermetabolism with max SUV 2.7 (series 3/image 89), mildly decreased from 2.1 x 1.7 cm on 03/31/2020 chest CT. New patchy bandlike consolidation in the adjacent superior segment left lower lobe compatible with early postradiation change. No enlarged or hypermetabolic axillary, mediastinal or hilar lymph nodes. Incidental CT findings: Coronary atherosclerosis. Atherosclerotic nonaneurysmal thoracic aorta. ABDOMEN/PELVIS: No abnormal hypermetabolic activity within the liver, pancreas, adrenal glands, or spleen. No hypermetabolic lymph nodes in the abdomen or pelvis. Incidental CT findings: Brachytherapy seeds in the prostate. Marked  diffuse colonic diverticulosis. Atherosclerotic nonaneurysmal abdominal aorta. SKELETON: New hypermetabolic lytic mildly expansile 2.6 cm left scapula lesion near the glenoid with max SUV 12.3. New hypermetabolic lytic T2 vertebral lesion with max SUV 4.5. Incidental CT findings: none IMPRESSION: 1. New hypermetabolic lytic bone metastases in the left scapula near the glenoid and in the T2 vertebral body. 2. Persistent hypermetabolic right lateral retropharyngeal and right level 3 neck lymph node metastases. 3. Peripheral left upper lobe pulmonary nodule is mildly decreased since 03/31/2020 chest CT with new surrounding mild bandlike postradiation changes, compatible with treatment response. Low level hypermetabolism within this nodule is nonspecific. Continued chest CT or PET-CT follow-up advised. 4. No residual or recurrent hypermetabolism at the site of the primary tumor in the right para form sinus. 5. Chronic findings include: Aortic Atherosclerosis (ICD10-I70.0). Coronary atherosclerosis. Marked colonic diverticulosis. Electronically Signed   By: Jason A Poff M.D.   On: 05/23/2020 09:27        ASSESSMENT & PLAN:  1. Larynx cancer (HCC)   2. 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HCC)   3. Squamous cell carcinoma of left lung (HCC)   4. 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 Weight loss   6. Encounter for antineoplastic chemotherapy   7. Hypomagnesemia   Cancer Staging Larynx cancer (HCC) Staging form: Larynx - Glottis, AJCC 8th Edition - Clinical: Stage IVC (cT1, cNX, cM1) - Signed by , , MD on 07/26/2020  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HCC) Staging form: Pharynx - P16 Negative Oropharynx, AJCC 8th Edition - Clinical stage from 06/23/2019: Stage IVB (cT4b, cN2b, cM0, p16-) - Signed by , , MD on 06/23/2019  Squamous cell lung cancer (HCC) Staging form: Lung, AJCC 8th Edition - Clinical: cT1, cN0, cM0 - Signed by , , MD on 07/19/2019   #StageIVB Head and neck  cancer-oropharyngeal squamous cell carcinoma Tongue base mass extending into right piriform sinus [hypopharynx] cT4 cN2b cM0- Stage IVB p16 negative, July 2021 status post chemoradiation 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 Previously biopsy-proven left upper lobe lung SCC nodule size mildly decreased. Labs are reviewed and discussed with patient. cisplatin 30 mg/m2 Concurrent radiation per rad onc Nystatin oral rinse swish +/- swallow for thrush prophylaxis.  Odynophagia, Sucrafate QID. Continue follow-up with palliative care service.  #Left upper lobe squamous cell carcinoma, stage I Finished SBRT on 02/07/2020.  Monitor disease status. #Right jugular vein thrombus, continue Eliquis 5 mg twice daily. #chronic hypomagnesemia, magnesium is normal today.  Continue slow magnesium 1 tablet twice daily.    All questions were answered. The patient knows to call the clinic with any problems questions or concerns.   Return of visit: lab/MD/cisplatin   , MD, PhD Hematology Oncology [facility] Pager- [phone] 08/16/2020  

## 2020-08-16 NOTE — Patient Instructions (Signed)
[facility]  Discharge Instructions: Thank you for choosing [facility] to provide your oncology and hematology care.  If you have a lab appointment with the [facility], please go directly to the [facility] and check in at the registration area.  Wear comfortable clothing and clothing appropriate for easy access to any Portacath or PICC line.   We strive to give you quality time with your provider. You may need to reschedule your appointment if you arrive late (15 or more minutes).  Arriving late affects you and other patients whose appointments are after yours.  Also, if you miss three or more appointments without notifying the office, you may be dismissed from the clinic at the provider's discretion.      For prescription refill requests, have your pharmacy contact our office and allow 72 hours for refills to be completed.    Today you received the following chemotherapy and/or immunotherapy agents Cisplatin      To help prevent nausea and vomiting after your treatment, we encourage you to take your nausea medication as directed.  BELOW ARE SYMPTOMS THAT SHOULD BE REPORTED IMMEDIATELY: *FEVER GREATER THAN 100.4 F (38 C) OR HIGHER *CHILLS OR SWEATING *NAUSEA AND VOMITING THAT IS NOT CONTROLLED WITH YOUR NAUSEA MEDICATION *UNUSUAL SHORTNESS OF BREATH *UNUSUAL BRUISING OR BLEEDING *URINARY PROBLEMS (pain or burning when urinating, or frequent urination) *BOWEL PROBLEMS (unusual diarrhea, constipation, pain near the anus) TENDERNESS IN MOUTH AND THROAT WITH OR WITHOUT PRESENCE OF ULCERS (sore throat, sores in mouth, or a toothache) UNUSUAL RASH, SWELLING OR PAIN  UNUSUAL VAGINAL DISCHARGE OR ITCHING   Items with * indicate a potential emergency and should be followed up as soon as possible or go to the Emergency Department if any problems should occur.  Please show the CHEMOTHERAPY ALERT CARD or IMMUNOTHERAPY ALERT CARD at check-in  to the Emergency Department and triage nurse.  Should you have questions after your visit or need to cancel or reschedule your appointment, please contact [facility]  [phone] and follow the prompts.  Office hours are 8:00 a.m. to 4:30 p.m. Monday - Friday. Please note that voicemails left after 4:00 p.m. may not be returned until the following business day.  We are closed weekends and major holidays. You have access to a nurse at all times for urgent questions. Please call the main number to the clinic [phone] and follow the prompts.  For any non-urgent questions, you may also contact your provider using MyChart. We now offer e-Visits for anyone 54 and older to request care online for non-urgent symptoms. For details visit [email or web address].   Also download the MyChart app! Go to the app store, search "MyChart", open the app, select [facility], and log in with your MyChart username and password.  Due to Covid, a mask is required upon entering the hospital/clinic. If you do not have a mask, one will be given to you upon arrival. For doctor visits, patients may have 1 support person aged 96 or older with them. For treatment visits, patients cannot have anyone with them due to current Covid guidelines and our immunocompromised population. Cisplatin injection What is this medication? CISPLATIN (SIS pla tin) is a chemotherapy drug. It targets fast dividing cells, like cancer cells, and causes these cells to die. This medicine is used totreat many types of cancer like bladder, ovarian, and testicular cancers. This medicine may be used for other purposes; ask your health care provider orpharmacist  if you have questions. COMMON BRAND NAME(S): Platinol, Platinol -AQ What should I tell my care team before I take this medication? They need to know if you have any of these conditions: eye disease, vision problems hearing problems kidney disease low blood  counts, like white cells, platelets, or red blood cells tingling of the fingers or toes, or other nerve disorder an unusual or allergic reaction to cisplatin, carboplatin, oxaliplatin, other medicines, foods, dyes, or preservatives pregnant or trying to get pregnant breast-feeding How should I use this medication? This drug is given as an infusion into a vein. It is administered in a hospitalor clinic by a specially trained health care professional. Talk to your pediatrician regarding the use of this medicine in children.Special care may be needed. Overdosage: If you think you have taken too much of this medicine contact apoison control center or emergency room at once. NOTE: This medicine is only for you. Do not share this medicine with others. What if I miss a dose? It is important not to miss a dose. Call your doctor or health careprofessional if you are unable to keep an appointment. What may interact with this medication? This medicine may interact with the following medications: foscarnet certain antibiotics like amikacin, gentamicin, neomycin, polymyxin B, streptomycin, tobramycin, vancomycin This list may not describe all possible interactions. Give your health care provider a list of all the medicines, herbs, non-prescription drugs, or dietary supplements you use. Also tell them if you smoke, drink alcohol, or use illegaldrugs. Some items may interact with your medicine. What should I watch for while using this medication? Your condition will be monitored carefully while you are receiving this medicine. You will need important blood work done while you are taking thismedicine. This drug may make you feel generally unwell. This is not uncommon, as chemotherapy can affect healthy cells as well as cancer cells. Report any side effects. Continue your course of treatment even though you feel ill unless yourdoctor tells you to stop. This medicine may increase your risk of getting an infection.  Call your healthcare professional for advice if you get a fever, chills, or sore throat, or other symptoms of a cold or flu. Do not treat yourself. Try to avoid beingaround people who are sick. Avoid taking medicines that contain aspirin, acetaminophen, ibuprofen, naproxen, or ketoprofen unless instructed by your healthcare professional.These medicines may hide a fever. This medicine may increase your risk to bruise or bleed. Call your doctor orhealth care professional if you notice any unusual bleeding. Be careful brushing and flossing your teeth or using a toothpick because you may get an infection or bleed more easily. If you have any dental work done,tell your dentist you are receiving this medicine. Do not become pregnant while taking this medicine or for 14 months after stopping it. Women should inform their healthcare professional if they wish to become pregnant or think they might be pregnant. Men should not father a child while taking this medicine and for 11 months after stopping it. There is potential for serious side effects to an unborn child. Talk to your healthcareprofessional for more information. Do not breast-feed an infant while taking this medicine. This medicine has caused ovarian failure in some women. This medicine may make it more difficult to get pregnant. Talk to your healthcare professional if [employer] concerned about your fertility. This medicine has caused decreased sperm counts in some men. This may make it more difficult to father a child. Talk to your healthcare professional if [employer]  concerned about your fertility. Drink fluids as directed while you are taking this medicine. This will helpprotect your kidneys. Call your doctor or health care professional if you get diarrhea. Do not treatyourself. What side effects may I notice from receiving this medication? Side effects that you should report to your doctor or health care professionalas soon as possible: allergic reactions  like skin rash, itching or hives, swelling of the face, lips, or tongue blurred vision changes in vision decreased hearing or ringing of the ears nausea, vomiting pain, redness, or irritation at site where injected pain, tingling, numbness in the hands or feet signs and symptoms of bleeding such as bloody or black, tarry stools; red or dark brown urine; spitting up blood or brown material that looks like coffee grounds; red spots on the skin; unusual bruising or bleeding from the eyes, gums, or nose signs and symptoms of infection like fever; chills; cough; sore throat; pain or trouble passing urine signs and symptoms of kidney injury like trouble passing urine or change in the amount of urine signs and symptoms of low red blood cells or anemia such as unusually weak or tired; feeling faint or lightheaded; falls; breathing problems Side effects that usually do not require medical attention (report to yourdoctor or health care professional if they continue or are bothersome): loss of appetite mouth sores muscle cramps This list may not describe all possible side effects. Call your doctor for medical advice about side effects. You may report side effects to FDA at1-800-FDA-1088. Where should I keep my medication? This drug is given in a hospital or clinic and will not be stored at home. NOTE: This sheet is a summary. It may not cover all possible information. If you have questions about this medicine, talk to your doctor, pharmacist, orhealth care provider.  2022 [employer] Standard (2018-02-06 15:59:17)

## 2020-08-16 NOTE — Progress Notes (Signed)
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  Patient receiving radiation and chemotherapy.   Met with wife in radiation waiting area as patient with memory deficits.  Wife reports that patient is having some soreness with swallowing but able to tolerate all consistency of foods.  Wife reports that patient is drinking at least 2 shakes per day and she is trying to get him to drink 3.  Still eating solid foods.      Medications: reviewed  Labs: reviewed  Anthropometrics:   Wife feels like weight of 112 lb, 118 lb are not correct.  Says that patient has been weighing ~106 lb  104 lb 4 oz today  106 lb 6.4 oz 6/15 107 lb on 6/8 109 lb on 6/1 112 lb on 4/1 118 lb on 04/04/20    NUTRITION DIAGNOSIS: Inadequate oral intake continues   INTERVENTION:  Complimentary case of ensure plus given to patient today.  Recommend 3-4 ensure plus daily for added nutrition.  Wife will try and encourage Continue high calorie, high protein foods Wife has contact information    MONITORING, EVALUATION, GOAL: weight trends, intake   NEXT VISIT: Wednesday, June 29 speak with wife  Darl Brisbin B. Zenia Resides, [place], [place] Registered Dietitian [phone] (mobile)

## 2020-08-17 ENCOUNTER — Ambulatory Visit
Admission: RE | Admit: 2020-08-17 | Discharge: 2020-08-17 | Disposition: A | Discharge status: Home / Self Care | Payer: Medicare Other | Source: Ambulatory Visit | Attending: Radiation Oncology | Admitting: Radiation Oncology

## 2020-08-17 ENCOUNTER — Inpatient Hospital Stay: Payer: Medicare Other

## 2020-08-17 DIAGNOSIS — C01 Malignant neoplasm of base of tongue: Secondary | ICD-10-CM | POA: Diagnosis not present

## 2020-08-18 ENCOUNTER — Ambulatory Visit
Admission: RE | Admit: 2020-08-18 | Discharge: 2020-08-18 | Disposition: A | Discharge status: Home / Self Care | Payer: Medicare Other | Source: Ambulatory Visit | Attending: Radiation Oncology | Admitting: Radiation Oncology

## 2020-08-18 ENCOUNTER — Inpatient Hospital Stay: Payer: Medicare Other

## 2020-08-18 DIAGNOSIS — C01 Malignant neoplasm of base of tongue: Secondary | ICD-10-CM | POA: Diagnosis not present

## 2020-08-21 ENCOUNTER — Inpatient Hospital Stay: Payer: Medicare Other

## 2020-08-21 ENCOUNTER — Other Ambulatory Visit: Payer: Self-pay

## 2020-08-21 ENCOUNTER — Ambulatory Visit
Admission: RE | Admit: 2020-08-21 | Discharge: 2020-08-21 | Disposition: A | Discharge status: Home / Self Care | Payer: Medicare Other | Source: Ambulatory Visit | Attending: Radiation Oncology | Admitting: Radiation Oncology

## 2020-08-21 DIAGNOSIS — C01 Malignant neoplasm of base of tongue: Secondary | ICD-10-CM | POA: Diagnosis not present

## 2020-08-22 ENCOUNTER — Inpatient Hospital Stay: Payer: Medicare Other

## 2020-08-22 ENCOUNTER — Ambulatory Visit: Payer: Medicare Other

## 2020-08-23 ENCOUNTER — Inpatient Hospital Stay: Payer: Medicare Other

## 2020-08-23 ENCOUNTER — Ambulatory Visit: Payer: Medicare Other

## 2020-08-23 ENCOUNTER — Encounter: Payer: Self-pay | Admitting: Oncology

## 2020-08-23 ENCOUNTER — Inpatient Hospital Stay (HOSPITAL_BASED_OUTPATIENT_CLINIC_OR_DEPARTMENT_OTHER): Payer: Medicare Other | Admitting: Oncology

## 2020-08-23 ENCOUNTER — Other Ambulatory Visit: Payer: Self-pay

## 2020-08-23 VITALS — BP 116/84 | HR 70 | Temp 97.8°F | Resp 18 | Wt 102.0 lb

## 2020-08-23 DIAGNOSIS — C3492 Malignant neoplasm of unspecified part of left bronchus or lung: Secondary | ICD-10-CM | POA: Diagnosis not present

## 2020-08-23 DIAGNOSIS — C01 Malignant neoplasm of base of tongue: Secondary | ICD-10-CM | POA: Diagnosis not present

## 2020-08-23 DIAGNOSIS — C109 Malignant neoplasm of oropharynx, unspecified: Secondary | ICD-10-CM | POA: Diagnosis not present

## 2020-08-23 DIAGNOSIS — F1027 Alcohol dependence with alcohol-induced persisting dementia: Secondary | ICD-10-CM | POA: Diagnosis not present

## 2020-08-23 DIAGNOSIS — R634 Abnormal weight loss: Secondary | ICD-10-CM

## 2020-08-23 DIAGNOSIS — Z95828 Presence of other vascular implants and grafts: Secondary | ICD-10-CM

## 2020-08-23 DIAGNOSIS — C329 Malignant neoplasm of larynx, unspecified: Secondary | ICD-10-CM | POA: Diagnosis not present

## 2020-08-23 DIAGNOSIS — Z5111 Encounter for antineoplastic chemotherapy: Secondary | ICD-10-CM

## 2020-08-23 LAB — CBC WITH DIFFERENTIAL/PLATELET
Abs Immature Granulocytes: 0.03 10*3/uL (ref 0.00–0.07)
Basophils Absolute: 0 10*3/uL (ref 0.0–0.1)
Basophils Relative: 0 %
Eosinophils Absolute: 0 10*3/uL (ref 0.0–0.5)
Eosinophils Relative: 0 %
HCT: 30.8 % — ABNORMAL LOW (ref 39.0–52.0)
Hemoglobin: 9.9 g/dL — ABNORMAL LOW (ref 13.0–17.0)
Immature Granulocytes: 1 %
Lymphocytes Relative: 11 %
Lymphs Abs: 0.4 10*3/uL — ABNORMAL LOW (ref 0.7–4.0)
MCH: 28.9 pg (ref 26.0–34.0)
MCHC: 32.1 g/dL (ref 30.0–36.0)
MCV: 89.8 fL (ref 80.0–100.0)
Monocytes Absolute: 0.2 10*3/uL (ref 0.1–1.0)
Monocytes Relative: 6 %
Neutro Abs: 3.3 10*3/uL (ref 1.7–7.7)
Neutrophils Relative %: 82 %
Platelets: 154 10*3/uL (ref 150–400)
RBC: 3.43 MIL/uL — ABNORMAL LOW (ref 4.22–5.81)
RDW: 15.6 % — ABNORMAL HIGH (ref 11.5–15.5)
WBC: 4 10*3/uL (ref 4.0–10.5)
nRBC: 0 % (ref 0.0–0.2)

## 2020-08-23 LAB — COMPREHENSIVE METABOLIC PANEL
ALT: 21 U/L (ref 0–44)
AST: 17 U/L (ref 15–41)
Albumin: 2.9 g/dL — ABNORMAL LOW (ref 3.5–5.0)
Alkaline Phosphatase: 54 U/L (ref 38–126)
Anion gap: 5 (ref 5–15)
BUN: 20 mg/dL (ref 8–23)
CO2: 26 mmol/L (ref 22–32)
Calcium: 8.6 mg/dL — ABNORMAL LOW (ref 8.9–10.3)
Chloride: 105 mmol/L (ref 98–111)
Creatinine, Ser: 0.69 mg/dL (ref 0.61–1.24)
GFR, Estimated: >60 mL/min (ref >60)
Glucose, Bld: 133 mg/dL — ABNORMAL HIGH (ref 70–99)
Potassium: 3.8 mmol/L (ref 3.5–5.1)
Sodium: 136 mmol/L (ref 135–145)
Total Bilirubin: 0.7 mg/dL (ref 0.3–1.2)
Total Protein: 5.7 g/dL — ABNORMAL LOW (ref 6.5–8.1)

## 2020-08-23 LAB — MAGNESIUM: Magnesium: 1.8 mg/dL (ref 1.7–2.4)

## 2020-08-23 MED ORDER — HEPARIN SOD (PORK) LOCK FLUSH 100 UNIT/ML IV SOLN
INTRAVENOUS | Status: AC
  Filled 2020-08-23: qty 5

## 2020-08-23 MED ORDER — HEPARIN SOD (PORK) LOCK FLUSH 100 UNIT/ML IV SOLN
500.0000 [IU] | Freq: Once | INTRAVENOUS | Status: AC
Start: 2020-08-23 — End: 2020-08-23
  Administered 2020-08-23: 500 [IU] via INTRAVENOUS
  Filled 2020-08-23: qty 5

## 2020-08-23 MED ORDER — LIDOCAINE-PRILOCAINE 2.5-2.5 % EX CREA: 1.0000 "application " | TOPICAL_CREAM | CUTANEOUS | 1 refills | Status: AC | PRN

## 2020-08-23 MED ORDER — SODIUM CHLORIDE 0.9 % IV SOLN
Freq: Once | INTRAVENOUS | Status: AC
  Filled 2020-08-23: qty 250

## 2020-08-23 NOTE — Progress Notes (Signed)
Hematology/Oncology progress note [facility] [phone] [phone]   Patient Care Team: [name], [name], MD as PCP - General (Internal Medicine) [name], MD as Consulting Physician (Oncology)  REFERRING PROVIDER: [name], [name], [name]  HISTORY OF PRESENTING ILLNESS:  Gregory Burton is a  79 y.o.  male with PMH listed below was seen in consultation at the request of  [name], [name], MD  for evaluation of lymph node.  Patient moved from [place] to [place] for about 3 weeks. He has noticed a neck knot on the right side of his neck. The knot is sore and makes him to cough and he feels it when he swallows. No swallowing difficulty.  Patient was accompanied by his wife. She reports that patient's previous PCP has done neck sonogram for evaluation.  Patient also had right lower molar tooth extraction done a few weeks before he noticed  He also took a course of antibiotics.  Due to his tooth pain, his appetite has decreased and he has lost weight loss, about 20 pounds, he can not specify the time frame.   Alcoholism History of prostate cancer. S/p Radiation/seed, he follows up with [facility].   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  Interval decrease of the posterior left upper lobe pleural-based nodule  with interval decrease in size of the tiny adjacent satellite nodule.  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  No recurrent tumor in right piriform sinus.  Right lateral pharyngeal lymph node is stable.  No new or recurrent adenopathy.Suspect metastasis.  I recommend patient to obtain PET scan restaging.  I discussed about the plan of re-biopsy of either the paraspinal soft tissue mass versus other more feasible hypermetabolic sites detected on PET scan. Patient was seen by [name] and opted to proceed with palliative radiation as soon as possible.  PET scan cannot be arranged prior to the radiation so the PET scan was canceled. 04/20/2020 -04/25/2020, palliative radiation to thoracic spine  05/10/2020, MRI thoracic spine was obtained for worsening of back pain and left shoulder pain.  Images showed metastatic disease throughout almost the entire T2 with and associated mild superior endplate compression fracture.  Abnormal signal in the anterior, inferior endplate of T1, superior aspect of the T3.  Small metastatic deposit in T12 is also noted. Case was also discussed on multidisciplinary tumor board on 05/25/2020 05/22/2020, PET scan showed new hypermetabolic lytic bone metastasis in the left scapular near the glenoid and in the T2 vertebral body.  Persistent hypermetabolic right lateral retropharyngeal and right level 3 neck lymph node metastasis.  Peripheral left upper lobe pulmonary nodule is mildly decreased with new surrounding mild platelike postradiation changes..  No residual recurrent neoplasm at the site of vomiting in the right piriform sinus  06/07/2020 -06/09/2020 patient underwent palliative radiation to left scapular April 2022 seen by [name]   and flexible laryngoscope examination showed new lesion at the right false vocal cord.  Case was discussed on tumor board on 06/22/2020. Dr. [name] recommend additional CT soft tissue neck with contrast for additional  evaluation. 06/29/2020 CT images were reviewed and discussed with patient. There is new malignant lymph node in the right mid neck at the level of the hyoid, this compressing the right jugular vein with thrombus in the vein above and below the lymph node.  Cystic retropharyngeal lymph node on the right is stable.  Progressive soft tissue thickening in the larynx bilaterally suspicious for tumor.  Metastatic disease at the T2 is unchanged.  Metastatic deposit left scapula there is now a pathological fracture of the scapular. CT scan findings were discussed with ENT Dr. [name] via secure chat.  Dr. [name] feels surgery is not feasible.  The larynx lesion currently does not compromise his airway however further progression may in the future  07/10/2020 Seen by Radon[facility] [name] who would offer salvage radiation to his larynx as well as neck adenopathy. Would like low dose chemotherapy as sensitizer.    Patient was also referred to establish care with Dr. [name] at [facility] for kyphoplasty.  Patient's wife reports that they were never contacted and she called Dr. [name] office with no success.   NGS.  PD-L1-IHC 40%, no targetable mutations.   INTERVAL HISTORY Gregory Burton is a 80 y.o. male who has above history reviewed by me today presents for follow-up of head and neck cancer and lung cancer Patient was accompanied by wife. Patient is on concurrent chemoradiation.  This week he has felt weaker and Dr. [name] canceled the radiation for this week. Patient uses nystatin swish and spit 4 times a day.  He also uses sucralfate liquid.  Odynophagia has improved. Patient has lost 3 pounds.  He drinks nutrition supplement.  Review of Systems  Constitutional:  Positive for fatigue and unexpected weight change. Negative for appetite change, chills and fever.  HENT:   Positive for hearing loss and voice change.   Eyes:  Negative for eye problems and icterus.  Respiratory:  Negative for chest tightness,  cough and shortness of breath.   Cardiovascular:  Negative for chest pain and leg swelling.  Gastrointestinal:  Negative for abdominal distention, abdominal pain, blood in stool and nausea.  Endocrine: Negative for hot flashes.  Genitourinary:  Negative for difficulty urinating, dysuria and frequency.   Musculoskeletal:  Positive for arthralgias.  Skin:  Negative for itching and rash.  Neurological:  Negative for extremity weakness, light-headedness and numbness.  Hematological:  Negative for adenopathy. Does not bruise/bleed easily.  Psychiatric/Behavioral:  Negative for confusion.        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   Prostate CA (HCC) 2008   Squamous cell lung cancer (HCC) 06/23/2019    SURGICAL HISTORY: Past Surgical History:  Procedure Laterality Date   BRAIN SURGERY  2012   HERNIA REPAIR     PORTA CATH INSERTION N/A 07/27/2019   Procedure: PORTA CATH INSERTION;  Surgeon: Renford Dills, MD;  Location: [facility] INVASIVE CV LAB;  Service: Cardiovascular;  Laterality: N/A;    SOCIAL HISTORY: Social History  Socioeconomic History   Marital status: Married    Spouse name: Not on file   Number of children: Not on file   Years of education: Not on file   Highest education level: Not on file  Occupational History   Not on file  Tobacco Use   Smoking status: Former    Years: 21.00    Pack years: 0.00    Types: Cigarettes    Quit date: 05/11/2008    Years since quitting: 12.2   Smokeless tobacco: Never  Vaping Use   Vaping Use: Never used  Substance and Sexual Activity   Alcohol use: Yes    Comment: 0.5 pint liquor a week   Drug use: Never   Sexual activity: Not Currently  Other Topics Concern   Not on  file  Social History Narrative   Lives at home with wife. Wife states he has some dementia but is able to sign his own consent.   Social Determinants of Health   Financial Resource Strain: Not on file  Food Insecurity: Not on file  Transportation Needs: Not on file  Physical Activity: Not on file  Stress: Not on file  Social Connections: Not on file  Intimate Partner Violence: Not on file    FAMILY HISTORY: History reviewed. No pertinent family history.  ALLERGIES:  is allergic to enalapril.  MEDICATIONS:  Current Outpatient Medications  Medication Sig Dispense Refill   apixaban (ELIQUIS) 2.5 MG TABS tablet Take 1 tablet (2.5 mg total) by mouth 2 (two) times daily. 60 tablet 1   atorvastatin (LIPITOR) 20 MG tablet Take 20 mg by mouth daily.     dexamethasone (DECADRON) 4 MG tablet Take 2 tablets (8 mg total) by mouth daily. Take daily x 3 days starting the day after cisplatin chemotherapy. Take with food. 30 tablet 1   donepezil (ARICEPT) 10 MG tablet Take 10 mg by mouth at bedtime.     dorzolamide-timolol (COSOPT) 22.3-6.8 MG/ML ophthalmic solution Place 1 drop into both eyes 2 (two) times daily.      folic acid (FOLVITE) 1 MG tablet Take 1 mg by mouth daily.     latanoprost (XALATAN) 0.005 % ophthalmic solution Place 1 drop into both eyes at bedtime.      MAG64 64 MG TBEC TAKE 1 TABLET (64 MG TOTAL) BY MOUTH 2 (TWO) TIMES DAILY. 180 tablet 0   megestrol (MEGACE) 40 MG tablet TAKE 1 TABLET BY MOUTH TWICE A DAY 60 tablet 0   Multiple Vitamins-Minerals (CENTRUM ADULTS PO) Take by mouth.     nystatin (MYCOSTATIN) 100000 UNIT/ML suspension Take 5 mLs (500,000 Units total) by mouth 4 (four) times daily. 473 mL 1   oxyCODONE (OXY IR/ROXICODONE) 5 MG immediate release tablet Take 1 tablet (5 mg total) by mouth every 4 (four) hours as needed for severe pain. 90 tablet 0   sucralfate (CARAFATE) 1 GM/10ML suspension Take 10 mLs (1 g total) by mouth 4 (four) times daily -  with meals and at  bedtime. 420 mL 0   Thiamine HCl (VITAMIN B-1 PO) Take 100 mg by mouth daily.     lidocaine-prilocaine (EMLA) cream Apply 1 application topically as needed. Apply small amount to port site approx 1-2 hours prior to appointment. 30 g 1   pantoprazole (PROTONIX) 20 MG tablet Take 1 tablet (20 mg total) by mouth 2 (two) times daily. (Patient not taking: No sig reported) 90 tablet 0   No current facility-administered medications for this visit.   Facility-Administered Medications  Ordered in Other Visits  Medication Dose Route Frequency Provider Last Rate Last Admin   sodium chloride flush (NS) 0.9 % injection 10 mL  10 mL Intravenous PRN Earlie Server, MD   10 mL at 03/01/20 0812     PHYSICAL EXAMINATION: ECOG PERFORMANCE STATUS: 1 - Symptomatic but completely ambulatory Vitals:   08/23/20 0934  BP: 116/84  Pulse: 70  Resp: 18  Temp: 97.8 F (36.6 C)  SpO2: 98%   Filed Weights   08/23/20 0934  Weight: 102 lb (46.3 kg)    Physical Exam Constitutional:      General: He is not in acute distress. HENT:     Head: Normocephalic and atraumatic.  Eyes:     General: No scleral icterus. Neck:     Comments: Palpable right cervical lymphadenopathy  Cardiovascular:     Rate and Rhythm: Normal rate and regular rhythm.     Heart sounds: Normal heart sounds.  Pulmonary:     Effort: Pulmonary effort is normal. No respiratory distress.     Breath sounds: No wheezing.  Abdominal:     General: Bowel sounds are normal. There is no distension.     Palpations: Abdomen is soft.  Musculoskeletal:        General: No deformity.     Cervical back: Normal range of motion and neck supple.     Comments: Left shoulder pain has improved  Skin:    General: Skin is warm and dry.     Findings: No erythema or rash.  Neurological:     Mental Status: He is alert. Mental status is at baseline.     Cranial Nerves: No cranial nerve deficit.     Coordination: Coordination normal.    LABORATORY DATA:  I have  reviewed the data as listed Lab Results  Component Value Date   WBC 4.0 08/23/2020   HGB 9.9 (L) 08/23/2020   HCT 30.8 (L) 08/23/2020   MCV 89.8 08/23/2020   PLT 154 08/23/2020   Recent Labs    09/01/19 0751 09/08/19 0832 11/15/19 0818 12/31/19 0927 08/09/20 0846 08/16/20 0833 08/23/20 0909  NA 135 140 141   < > 137 138 136  K 3.9 4.2 4.1   < > 3.6 4.0 3.8  CL 105 107 109   < > 104 105 105  CO2 21* 25 24   < > $R'27 26 26  'Ss$ GLUCOSE 100* 125* 98   < > 133* 111* 133*  BUN $Re'19 23 19   'wsi$ < > $R'20 17 20  'sA$ CREATININE 0.86 0.98 0.88   < > 0.80 0.78 0.69  CALCIUM 8.7* 9.0 9.2   < > 8.5* 8.6* 8.6*  GFRNONAA >60 >60 >60   < > >60 >60 >60  GFRAA >60 >60 >60  --   --   --   --   PROT 6.9 6.8 7.0   < > 6.3* 6.3* 5.7*  ALBUMIN 3.2* 3.2* 3.9   < > 3.1* 3.0* 2.9*  AST $Re'16 16 17   'KNc$ < > $R'17 17 17  'PS$ ALT $'16 13 19   'j$ < > $R'22 23 21  'iz$ ALKPHOS 55 56 41   < > 53 53 54  BILITOT 0.7 0.5 1.0   < > 0.7 0.8 0.7   < > = values in this interval not displayed.    Iron/TIBC/Ferritin/ %Sat No results found for: IRON, TIBC, FERRITIN, IRONPCTSAT    RADIOGRAPHIC STUDIES: I have personally reviewed the radiological images as listed and agreed  with the findings in the report. No results found. CT SOFT TISSUE NECK W CONTRAST  Result Date: 06/29/2020 CLINICAL DATA:  Current head neck cancer. 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 Worsening hoarseness. EXAM: CT NECK WITH CONTRAST TECHNIQUE: Multidetector CT imaging of the neck was performed using the standard protocol following the bolus administration of intravenous contrast. CONTRAST:  89mL OMNIPAQUE IOHEXOL 300 MG/ML  SOLN COMPARISON:  CT neck 03/31/2020 FINDINGS: Pharynx and larynx: Progressive thickening of the vocal cords bilaterally. This shows irregular enhancement and could be due to tumor versus radiation change. Direct visualization is recommended. Epiglottis normal. Oropharynx negative for recurrent tumor. Salivary glands: Parotid normal  bilaterally. Atrophic submandibular gland bilaterally without acute abnormality. Thyroid: Negative Lymph nodes: 17 mm lymph node in the right mid neck at the level of the hyoid bone is new since the prior study. This has irregular enhancement and is consistent with metastatic disease. This is compressing the right jugular vein with thrombus above and below the tumor Cystic retropharyngeal lymph node on the right 14 mm, unchanged from the prior study. Vascular: Thrombus in the right jugular vein due to compression by malignant lymph node in the right mid neck. Severe atherosclerotic disease in the carotid artery bilaterally which remain patent bilaterally. Atherosclerotic calcification aortic arch. Limited intracranial: Negative Visualized orbits: Chronic fracture right medial orbit unchanged. Mastoids and visualized paranasal sinuses: Negative Skeleton: Lytic destructive lesion T2 vertebral body on the right with mild pathologic fracture unchanged from the prior study. Metastatic disease is left scapula with pathologic fracture. Fracture not seen on prior studies. Upper chest: Lung apices clear bilaterally. Port-A-Cath tip right jugular vein. Other: None IMPRESSION: 1. 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 This is compressing the right jugular vein with thrombus in the vein above and below the lymph node. Cystic retropharyngeal lymph node on the right is stable 2. Progressive soft tissue thickening in the larynx bilaterally suspicious for tumor. Recommend direct visualization to differentiate from radiation change. 3. Metastatic disease at T2 is unchanged 4. Metastatic deposit left scapula. There is now a pathologic fracture of the scapula. Electronically Signed   By: Franchot Gallo M.D.   On: 06/29/2020 16:37        ASSESSMENT & PLAN:  1. 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 Squamous cell carcinoma of left lung ([place])   4. 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 Encounter for antineoplastic chemotherapy   6. Weight loss   7. 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 Hold chemotherapy.  Radiation this week has also been canceled.  Labs are reviewed  and discussed with patient. Nystatin oral rinse swish +/- swallow for thrush prophylaxis.  Odynophagia, Sucrafate QID.  Symptoms are improved. Continue follow-up with palliative care service. Will give patient 1 L of IV normal saline over 1 hour for hydration.Marland Kitchen  #Left upper lobe squamous cell carcinoma, stage I Finished SBRT on 02/07/2020.  Monitor disease status. #Right jugular vein thrombus, continue Eliquis 5 mg twice daily. #chronic hypomagnesemia, magnesium is normal today.  Continue slow magnesium 1 tablet twice daily.   #Weight loss, continue nutrition supplementation.  Follow-up with nutritionist. All questions were answered. The patient knows to call the clinic with any problems questions or concerns.   Return of visit: lab/MD/cisplatin 1 week  Earlie Server, MD, PhD Hematology Oncology [facility]  at [facility] Pager- [phone] 08/23/2020

## 2020-08-23 NOTE — Progress Notes (Signed)
Patient here for oncology follow-up appointment, expresses concerns of weakness, chest soreness and shortness of breath

## 2020-08-23 NOTE — Progress Notes (Signed)
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  Patient receiving radiation and chemotherapy.    Met with wife in radiation waiting area, patient with dementia.  Wife reports that Monday coming into radiation felt weak and not planning anymore radiation this week, resume next week.  No chemo today but getting fluids.  Wife said that she has started watching him to make sure he gets at least 3 ensure per day.  This am ate breakfast of toast with jelly, 2 eggs and ensure.  Supper last night was fish, pinto beans, greens and ensure.      Medications: megace  Labs: reviewed  Anthropometrics:   Weight 102 lb today  104 lb on 6/22 106 lb 6.4 oz on 6/15  107 lb on 6/8 109 lb on 6/1 112 lb on 4/1 118 lb on 04/04/20  Wife reports UBW is 106 lb and feels higher weights are not correct.    NUTRITION DIAGNOSIS: Inadequate oral intake continues    INTERVENTION:  Complimentary case of ensure given to wife today Continue 3-4 ensure per day. Wife will monitor Continue high calorie, high protein foods    MONITORING, EVALUATION, GOAL: weight trends, intake   NEXT VISIT: ~2 weeks  Stephani Janak B. Zenia Resides, [place], [place] Registered Dietitian [phone] (mobile)

## 2020-08-23 NOTE — Patient Instructions (Signed)
[facility] ONCOLOGY  Discharge Instructions: Thank you for choosing [facility] to provide your oncology and hematology care.  If you have a lab appointment with the [facility], please go directly to the [facility] and check in at the registration area.  Wear comfortable clothing and clothing appropriate for easy access to any Portacath or PICC line.   We strive to give you quality time with your provider. You may need to reschedule your appointment if you arrive late (15 or more minutes).  Arriving late affects you and other patients whose appointments are after yours.  Also, if you miss three or more appointments without notifying the office, you may be dismissed from the clinic at the provider's discretion.      For prescription refill requests, have your pharmacy contact our office and allow 72 hours for refills to be completed.    Today you received the following chemotherapy and/or immunotherapy agents IV fluids      To help prevent nausea and vomiting after your treatment, we encourage you to take your nausea medication as directed.  BELOW ARE SYMPTOMS THAT SHOULD BE REPORTED IMMEDIATELY: *FEVER GREATER THAN 100.4 F (38 C) OR HIGHER *CHILLS OR SWEATING *NAUSEA AND VOMITING THAT IS NOT CONTROLLED WITH YOUR NAUSEA MEDICATION *UNUSUAL SHORTNESS OF BREATH *UNUSUAL BRUISING OR BLEEDING *URINARY PROBLEMS (pain or burning when urinating, or frequent urination) *BOWEL PROBLEMS (unusual diarrhea, constipation, pain near the anus) TENDERNESS IN MOUTH AND THROAT WITH OR WITHOUT PRESENCE OF ULCERS (sore throat, sores in mouth, or a toothache) UNUSUAL RASH, SWELLING OR PAIN  UNUSUAL VAGINAL DISCHARGE OR ITCHING   Items with * indicate a potential emergency and should be followed up as soon as possible or go to the Emergency Department if any problems should occur.  Please show the CHEMOTHERAPY ALERT CARD or IMMUNOTHERAPY ALERT CARD at check-in  to the Emergency Department and triage nurse.  Should you have questions after your visit or need to cancel or reschedule your appointment, please contact [facility]  [phone] and follow the prompts.  Office hours are 8:00 a.m. to 4:30 p.m. Monday - Friday. Please note that voicemails left after 4:00 p.m. may not be returned until the following business day.  We are closed weekends and major holidays. You have access to a nurse at all times for urgent questions. Please call the main number to the clinic [phone] and follow the prompts.  For any non-urgent questions, you may also contact your provider using MyChart. We now offer e-Visits for anyone 64 and older to request care online for non-urgent symptoms. For details visit [email or web address].   Also download the MyChart app! Go to the app store, search "MyChart", open the app, select [facility], and log in with your MyChart username and password.  Due to Covid, a mask is required upon entering the hospital/clinic. If you do not have a mask, one will be given to you upon arrival. For doctor visits, patients may have 1 support person aged 2 or older with them. For treatment visits, patients cannot have anyone with them due to current Covid guidelines and our immunocompromised population.

## 2020-08-23 NOTE — Progress Notes (Signed)
No treatment today per Janeann Merl RN per [name].  Pt to get 1 liter NS over an hour.  Pt tolerated infusion well and left the chemo suite stable and ambulatory.

## 2020-08-24 ENCOUNTER — Inpatient Hospital Stay: Payer: Medicare Other

## 2020-08-24 ENCOUNTER — Ambulatory Visit: Payer: Medicare Other

## 2020-08-25 ENCOUNTER — Ambulatory Visit: Payer: Medicare Other

## 2020-08-25 ENCOUNTER — Inpatient Hospital Stay: Payer: Medicare Other

## 2020-08-26 ENCOUNTER — Other Ambulatory Visit: Payer: Self-pay | Admitting: Oncology

## 2020-08-27 ENCOUNTER — Encounter: Payer: Self-pay | Admitting: Oncology

## 2020-08-29 ENCOUNTER — Ambulatory Visit: Payer: Medicare Other

## 2020-08-29 ENCOUNTER — Ambulatory Visit
Admission: RE | Admit: 2020-08-29 | Discharge: 2020-08-29 | Disposition: A | Discharge status: Home / Self Care | Payer: Medicare Other | Source: Ambulatory Visit | Attending: Radiation Oncology | Admitting: Radiation Oncology

## 2020-08-29 DIAGNOSIS — Z85118 Personal history of other malignant neoplasm of bronchus and lung: Secondary | ICD-10-CM | POA: Insufficient documentation

## 2020-08-29 DIAGNOSIS — C770 Secondary and unspecified malignant neoplasm of lymph nodes of head, face and neck: Secondary | ICD-10-CM | POA: Insufficient documentation

## 2020-08-29 DIAGNOSIS — C7951 Secondary malignant neoplasm of bone: Secondary | ICD-10-CM | POA: Insufficient documentation

## 2020-08-29 DIAGNOSIS — Z510 Encounter for antineoplastic radiation therapy: Secondary | ICD-10-CM | POA: Insufficient documentation

## 2020-08-29 DIAGNOSIS — C139 Malignant neoplasm of hypopharynx, unspecified: Secondary | ICD-10-CM | POA: Insufficient documentation

## 2020-08-29 DIAGNOSIS — C01 Malignant neoplasm of base of tongue: Secondary | ICD-10-CM | POA: Insufficient documentation

## 2020-08-30 ENCOUNTER — Other Ambulatory Visit: Payer: Medicare Other

## 2020-08-30 ENCOUNTER — Encounter: Payer: Self-pay | Admitting: Oncology

## 2020-08-30 ENCOUNTER — Inpatient Hospital Stay: Payer: Medicare Other

## 2020-08-30 ENCOUNTER — Inpatient Hospital Stay: Payer: Medicare Other | Admitting: Oncology

## 2020-08-30 ENCOUNTER — Ambulatory Visit: Payer: Medicare Other

## 2020-08-30 ENCOUNTER — Inpatient Hospital Stay (HOSPITAL_BASED_OUTPATIENT_CLINIC_OR_DEPARTMENT_OTHER): Payer: Medicare Other | Admitting: Oncology

## 2020-08-30 ENCOUNTER — Ambulatory Visit
Admission: RE | Admit: 2020-08-30 | Discharge: 2020-08-30 | Disposition: A | Discharge status: Home / Self Care | Payer: Medicare Other | Source: Ambulatory Visit | Attending: Radiation Oncology | Admitting: Radiation Oncology

## 2020-08-30 ENCOUNTER — Other Ambulatory Visit: Payer: Self-pay

## 2020-08-30 ENCOUNTER — Ambulatory Visit: Payer: Medicare Other | Admitting: Oncology

## 2020-08-30 VITALS — BP 155/80 | HR 73 | Temp 97.1°F | Resp 18 | Wt 104.7 lb

## 2020-08-30 DIAGNOSIS — C7951 Secondary malignant neoplasm of bone: Secondary | ICD-10-CM | POA: Insufficient documentation

## 2020-08-30 DIAGNOSIS — Z85118 Personal history of other malignant neoplasm of bronchus and lung: Secondary | ICD-10-CM | POA: Insufficient documentation

## 2020-08-30 DIAGNOSIS — Z79899 Other long term (current) drug therapy: Secondary | ICD-10-CM | POA: Insufficient documentation

## 2020-08-30 DIAGNOSIS — C3492 Malignant neoplasm of unspecified part of left bronchus or lung: Secondary | ICD-10-CM

## 2020-08-30 DIAGNOSIS — C329 Malignant neoplasm of larynx, unspecified: Secondary | ICD-10-CM | POA: Diagnosis not present

## 2020-08-30 DIAGNOSIS — C109 Malignant neoplasm of oropharynx, unspecified: Secondary | ICD-10-CM | POA: Insufficient documentation

## 2020-08-30 DIAGNOSIS — F1027 Alcohol dependence with alcohol-induced persisting dementia: Secondary | ICD-10-CM

## 2020-08-30 DIAGNOSIS — R1310 Dysphagia, unspecified: Secondary | ICD-10-CM | POA: Insufficient documentation

## 2020-08-30 DIAGNOSIS — F1020 Alcohol dependence, uncomplicated: Secondary | ICD-10-CM | POA: Insufficient documentation

## 2020-08-30 DIAGNOSIS — I82C11 Acute embolism and thrombosis of right internal jugular vein: Secondary | ICD-10-CM | POA: Insufficient documentation

## 2020-08-30 DIAGNOSIS — R634 Abnormal weight loss: Secondary | ICD-10-CM | POA: Insufficient documentation

## 2020-08-30 DIAGNOSIS — Z5111 Encounter for antineoplastic chemotherapy: Secondary | ICD-10-CM | POA: Insufficient documentation

## 2020-08-30 DIAGNOSIS — Z510 Encounter for antineoplastic radiation therapy: Secondary | ICD-10-CM | POA: Diagnosis not present

## 2020-08-30 DIAGNOSIS — F0390 Unspecified dementia without behavioral disturbance: Secondary | ICD-10-CM | POA: Insufficient documentation

## 2020-08-30 LAB — CBC WITH DIFFERENTIAL/PLATELET
Abs Immature Granulocytes: 0.01 10*3/uL (ref 0.00–0.07)
Basophils Absolute: 0 10*3/uL (ref 0.0–0.1)
Basophils Relative: 0 %
Eosinophils Absolute: 0 10*3/uL (ref 0.0–0.5)
Eosinophils Relative: 0 %
HCT: 30.9 % — ABNORMAL LOW (ref 39.0–52.0)
Hemoglobin: 9.7 g/dL — ABNORMAL LOW (ref 13.0–17.0)
Immature Granulocytes: 0 %
Lymphocytes Relative: 17 %
Lymphs Abs: 0.5 10*3/uL — ABNORMAL LOW (ref 0.7–4.0)
MCH: 29.6 pg (ref 26.0–34.0)
MCHC: 31.4 g/dL (ref 30.0–36.0)
MCV: 94.2 fL (ref 80.0–100.0)
Monocytes Absolute: 0.3 10*3/uL (ref 0.1–1.0)
Monocytes Relative: 11 %
Neutro Abs: 2.2 10*3/uL (ref 1.7–7.7)
Neutrophils Relative %: 72 %
Platelets: 130 10*3/uL — ABNORMAL LOW (ref 150–400)
RBC: 3.28 MIL/uL — ABNORMAL LOW (ref 4.22–5.81)
RDW: 16.4 % — ABNORMAL HIGH (ref 11.5–15.5)
WBC: 3.1 10*3/uL — ABNORMAL LOW (ref 4.0–10.5)
nRBC: 0 % (ref 0.0–0.2)

## 2020-08-30 LAB — COMPREHENSIVE METABOLIC PANEL
ALT: 17 U/L (ref 0–44)
AST: 19 U/L (ref 15–41)
Albumin: 3.3 g/dL — ABNORMAL LOW (ref 3.5–5.0)
Alkaline Phosphatase: 69 U/L (ref 38–126)
Anion gap: 8 (ref 5–15)
BUN: 16 mg/dL (ref 8–23)
CO2: 28 mmol/L (ref 22–32)
Calcium: 9.1 mg/dL (ref 8.9–10.3)
Chloride: 103 mmol/L (ref 98–111)
Creatinine, Ser: 0.79 mg/dL (ref 0.61–1.24)
GFR, Estimated: >60 mL/min (ref >60)
Glucose, Bld: 90 mg/dL (ref 70–99)
Potassium: 4.1 mmol/L (ref 3.5–5.1)
Sodium: 139 mmol/L (ref 135–145)
Total Bilirubin: 0.6 mg/dL (ref 0.3–1.2)
Total Protein: 6.3 g/dL — ABNORMAL LOW (ref 6.5–8.1)

## 2020-08-30 LAB — MAGNESIUM: Magnesium: 1.8 mg/dL (ref 1.7–2.4)

## 2020-08-30 MED ORDER — SUCRALFATE 1 GM/10ML PO SUSP: 1.0000 g | Freq: Three times a day (TID) | ORAL | 2 refills | Status: DC

## 2020-08-30 NOTE — Progress Notes (Signed)
Hematology/Oncology progress note Gregory [facility] Telephone:([phone] [phone]   Patient Care Team: Casilda Carls, MD as PCP - General (Internal Medicine) Earlie Server, [name], [name]  HISTORY OF PRESENTING ILLNESS:  Gregory Burton is a  80 y.o.  male with PMH listed below was seen in consultation at the request of  Casilda Carls, MD  for evaluation of lymph node.  Patient moved from [place] to [place] for about 3 weeks. He has noticed a neck knot on the right side of his neck. The knot is sore and makes him to cough and he feels it when he swallows. No swallowing difficulty.  Patient was accompanied by his wife. She reports that patient's previous PCP has done neck sonogram for evaluation.  Patient also had right lower molar tooth extraction done a few weeks before he noticed  He also took a course of antibiotics.  Due to his tooth pain, his appetite has decreased and he has lost weight loss, about 20 pounds, he can not specify the time frame.   Alcoholism History of prostate cancer. S/p Radiation/seed, he follows up with [facility].   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  Interval decrease of the posterior left upper lobe pleural-based nodule  with interval decrease in size of the tiny adjacent satellite nodule.  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  No recurrent tumor in right piriform sinus.  Right lateral pharyngeal lymph node is stable.  No new or recurrent adenopathy.Suspect metastasis.  I recommend patient to obtain PET scan restaging.  I discussed about the plan of re-biopsy of either the paraspinal soft tissue mass versus other more feasible hypermetabolic sites detected on PET scan. Patient was seen by Dr. Kennyth Lose and opted to proceed with palliative radiation as soon as possible.  PET scan cannot be arranged prior to the radiation so the PET scan was canceled. 04/20/2020 -04/25/2020, palliative radiation to thoracic spine  05/10/2020, MRI thoracic spine was obtained for worsening of back pain and left shoulder pain.  Images showed metastatic disease throughout almost the entire T2 with and associated mild superior endplate compression fracture.  Abnormal signal in the anterior, inferior endplate of T1, superior aspect of the T3.  Small metastatic deposit in T12 is also noted. Case was also discussed on multidisciplinary tumor board on 05/25/2020 05/22/2020, PET scan showed new hypermetabolic lytic bone metastasis in the left scapular near the glenoid and in the T2 vertebral body.  Persistent hypermetabolic right lateral retropharyngeal and right level 3 neck lymph node metastasis.  Peripheral left upper lobe pulmonary nodule is mildly decreased with new surrounding mild platelike postradiation changes..  No residual recurrent neoplasm at the site of vomiting in the right piriform sinus  06/07/2020 -06/09/2020 patient underwent palliative radiation to left scapular April 2022 seen by [name]  and flexible laryngoscope examination showed new lesion at the right false vocal cord.  Case was discussed on tumor board on 06/22/2020. [name] recommend additional CT soft tissue neck with contrast for additional  evaluation. 06/29/2020 CT images were reviewed and discussed with patient. There is new malignant lymph node in the right mid neck at the level of the hyoid, this compressing the right jugular vein with thrombus in the vein above and below the lymph node.  Cystic retropharyngeal lymph node on the right is stable.  Progressive soft tissue thickening in the larynx bilaterally suspicious for tumor.  Metastatic disease at the T2 is unchanged.  Metastatic deposit left scapula there is now a pathological fracture of the scapular. CT scan findings were discussed with ENT [name] via secure chat.  [name] feels surgery is not feasible.  The larynx lesion currently does not compromise his airway however further progression may in the future  07/10/2020 Seen by Radonc [name] who would offer salvage radiation to his larynx as well as neck adenopathy. Would like low dose chemotherapy as sensitizer.    Patient was also referred to establish care with Dr. Georgiann Cocker at [facility] for kyphoplasty.  Patient's wife reports that they were never contacted and she called Dr. Georgiann Cocker office with no success.   NGS.  PD-L1-IHC 40%, no targetable mutations.   INTERVAL HISTORY Gregory Burton is a 80 y.o. male who has above history reviewed by me today presents for follow-up of head and neck cancer and lung cancer Patient was accompanied by wife. Patient is on concurrent chemoradiation.   Radiation and chemotherapy were held for 1 week and restarted this week Overall is doing slightly better.  He is drinking nutrition supplements and has gained 2 pounds.  Patient reports the swelling is slightly better.  No nausea vomiting, diarrhea,  Review of Systems  Constitutional:  Positive for fatigue. Negative for appetite change, chills, fever and unexpected weight change.  HENT:   Positive for hearing loss. Negative for voice change.   Eyes:  Negative for eye problems and icterus.  Respiratory:  Negative for chest  tightness, cough and shortness of breath.   Cardiovascular:  Negative for chest pain and leg swelling.  Gastrointestinal:  Negative for abdominal distention, abdominal pain, blood in stool and nausea.  Endocrine: Negative for hot flashes.  Genitourinary:  Negative for difficulty urinating, dysuria and frequency.   Musculoskeletal:  Positive for arthralgias.  Skin:  Negative for itching and rash.  Neurological:  Negative for extremity weakness, light-headedness and numbness.  Hematological:  Negative for adenopathy. Does not bruise/bleed easily.  Psychiatric/Behavioral:  Negative for confusion.        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   Prostate CA ([place]) 2008   Squamous cell lung cancer ([place]) 06/23/2019    SURGICAL HISTORY: Past Surgical History:  Procedure Laterality Date   BRAIN SURGERY  2012   HERNIA REPAIR     PORTA CATH INSERTION N/A 07/27/2019   Procedure: PORTA CATH INSERTION;  Surgeon: Katha Cabal, MD;  Location: [facility];  Service: Cardiovascular;  Laterality: N/A;    SOCIAL HISTORY: Social History   Socioeconomic History  Marital status: Married    Spouse name: Not on file   Number of children: Not on file   Years of education: Not on file   Highest education level: Not on file  Occupational History   Not on file  Tobacco Use   Smoking status: Former    Years: 21.00    Pack years: 0.00    Types: Cigarettes    Quit date: 05/11/2008    Years since quitting: 12.3   Smokeless tobacco: Never  Vaping Use   Vaping Use: Never used  Substance and Sexual Activity   Alcohol use: Yes    Comment: 0.5 pint liquor a week   Drug use: Never   Sexual activity: Not Currently  Other Topics  Concern   Not on file  Social History Narrative   Lives at home with wife. Wife states he has some dementia but is able to sign his own consent.   Social Determinants of Health   Financial Resource Strain: Not on file  Food Insecurity: Not on file  Transportation Needs: Not on file  Physical Activity: Not on file  Stress: Not on file  Social Connections: Not on file  Intimate Partner Violence: Not on file    FAMILY HISTORY: History reviewed. No pertinent family history.  ALLERGIES:  is allergic to enalapril.  MEDICATIONS:  Current Outpatient Medications  Medication Sig Dispense Refill   atorvastatin (LIPITOR) 20 MG tablet Take 20 mg by mouth daily.     dexamethasone (DECADRON) 4 MG tablet Take 2 tablets (8 mg total) by mouth daily. Take daily x 3 days starting the day after cisplatin chemotherapy. Take with food. 30 tablet 1   donepezil (ARICEPT) 10 MG tablet Take 10 mg by mouth at bedtime.     dorzolamide-timolol (COSOPT) 22.3-6.8 MG/ML ophthalmic solution Place 1 drop into both eyes 2 (two) times daily.      ELIQUIS 2.5 MG TABS tablet TAKE 1 TABLET BY MOUTH TWICE A DAY 60 tablet 1   folic acid (FOLVITE) 1 MG tablet Take 1 mg by mouth daily.     latanoprost (XALATAN) 0.005 % ophthalmic solution Place 1 drop into both eyes at bedtime.      lidocaine-prilocaine (EMLA) cream Apply 1 application topically as needed. Apply small amount to port site approx 1-2 hours prior to appointment. 30 g 1   MAG64 64 MG TBEC TAKE 1 TABLET (64 MG TOTAL) BY MOUTH 2 (TWO) TIMES DAILY. 180 tablet 0   megestrol (MEGACE) 40 MG tablet TAKE 1 TABLET BY MOUTH TWICE A DAY 60 tablet 0   Multiple Vitamins-Minerals (CENTRUM ADULTS PO) Take by mouth.     nystatin (MYCOSTATIN) 100000 UNIT/ML suspension Take 5 mLs (500,000 Units total) by mouth 4 (four) times daily. 473 mL 1   oxyCODONE (OXY IR/ROXICODONE) 5 MG immediate release tablet Take 1 tablet (5 mg total) by mouth every 4 (four) hours as needed for severe  pain. 90 tablet 0   Thiamine HCl (VITAMIN B-1 PO) Take 100 mg by mouth daily.     pantoprazole (PROTONIX) 20 MG tablet Take 1 tablet (20 mg total) by mouth 2 (two) times daily. (Patient not taking: No sig reported) 90 tablet 0   sucralfate (CARAFATE) 1 GM/10ML suspension Take 10 mLs (1 g total) by mouth 4 (four) times daily -  with meals and at bedtime. 420 mL 2   No current facility-administered medications for this visit.   Facility-Administered Medications Ordered in Other Visits  Medication Dose Route Frequency  Provider Last Rate Last Admin   sodium chloride flush (NS) 0.9 % injection 10 mL  10 mL Intravenous PRN Earlie Server, MD   10 mL at 03/01/20 0812     PHYSICAL EXAMINATION: ECOG PERFORMANCE STATUS: 1 - Symptomatic but completely ambulatory Vitals:   08/30/20 1408  BP: (!) 155/80  Pulse: 73  Resp: 18  Temp: (!) 97.1 F (36.2 C)   Filed Weights   08/30/20 1408  Weight: 104 lb 11.2 oz (47.5 kg)    Physical Exam Constitutional:      General: He is not in acute distress. HENT:     Head: Normocephalic and atraumatic.  Eyes:     General: No scleral icterus. Cardiovascular:     Rate and Rhythm: Normal rate and regular rhythm.     Heart sounds: Normal heart sounds.  Pulmonary:     Effort: Pulmonary effort is normal. No respiratory distress.     Breath sounds: No wheezing.  Abdominal:     General: Bowel sounds are normal. There is no distension.     Palpations: Abdomen is soft.  Musculoskeletal:        General: No deformity.     Cervical back: Normal range of motion and neck supple.     Comments: Left shoulder pain has improved  Lymphadenopathy:     Cervical: Cervical adenopathy present.  Skin:    General: Skin is warm and dry.     Findings: No erythema or rash.  Neurological:     Mental Status: He is alert. Mental status is at baseline.     Cranial Nerves: No cranial nerve deficit.     Coordination: Coordination normal.    LABORATORY DATA:  I have reviewed the  data as listed Lab Results  Component Value Date   WBC 3.1 (L) 08/30/2020   HGB 9.7 (L) 08/30/2020   HCT 30.9 (L) 08/30/2020   MCV 94.2 08/30/2020   PLT 130 (L) 08/30/2020   Recent Labs    09/01/19 0751 09/08/19 0832 11/15/19 0818 12/31/19 0927 08/16/20 0833 08/23/20 0909 08/30/20 1352  NA 135 140 141   < > 138 136 139  K 3.9 4.2 4.1   < > 4.0 3.8 4.1  CL 105 107 109   < > 105 105 103  CO2 21* 25 24   < > _0 GLUCOSE 100* 125* 98   < > 111* 133* 90  BUN _1 < > _2 CREATININE 0.86 0.98 0.88   < > 0.78 0.69 0.79  CALCIUM 8.7* 9.0 9.2   < > 8.6* 8.6* 9.1  GFRNONAA >60 >60 >60   < > >60 >60 >60  GFRAA >60 >60 >60  --   --   --   --   PROT 6.9 6.8 7.0   < > 6.3* 5.7* 6.3*  ALBUMIN 3.2* 3.2* 3.9   < > 3.0* 2.9* 3.3*  AST _3 < > _4 ALT _5 < > _6 ALKPHOS 55 56 41   < > 53 54 69  BILITOT 0.7 0.5 1.0   < > 0.8 0.7 0.6   < > = values in this interval not displayed.    Iron/TIBC/Ferritin/ %Sat No results found for: IRON, TIBC, FERRITIN, IRONPCTSAT    RADIOGRAPHIC STUDIES: I have personally reviewed the radiological images as listed and agreed with the findings in the report. No  results found. CT SOFT TISSUE NECK W CONTRAST  Result Date: 06/29/2020 CLINICAL DATA:  Current head neck cancer. 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 Worsening hoarseness. EXAM: CT NECK WITH CONTRAST TECHNIQUE: Multidetector CT imaging of the neck was performed using the standard protocol following the bolus administration of intravenous contrast. CONTRAST:  40m OMNIPAQUE IOHEXOL 300 MG/ML  SOLN COMPARISON:  CT neck 03/31/2020 FINDINGS: Pharynx and larynx: Progressive thickening of the vocal cords bilaterally. This shows irregular enhancement and could be due to tumor versus radiation change. Direct visualization is recommended. Epiglottis normal. Oropharynx negative for recurrent tumor. Salivary glands: Parotid normal bilaterally.  Atrophic submandibular gland bilaterally without acute abnormality. Thyroid: Negative Lymph nodes: 17 mm lymph node in the right mid neck at the level of the hyoid bone is new since the prior study. This has irregular enhancement and is consistent with metastatic disease. This is compressing the right jugular vein with thrombus above and below the tumor Cystic retropharyngeal lymph node on the right 14 mm, unchanged from the prior study. Vascular: Thrombus in the right jugular vein due to compression by malignant lymph node in the right mid neck. Severe atherosclerotic disease in the carotid artery bilaterally which remain patent bilaterally. Atherosclerotic calcification aortic arch. Limited intracranial: Negative Visualized orbits: Chronic fracture right medial orbit unchanged. Mastoids and visualized paranasal sinuses: Negative Skeleton: Lytic destructive lesion T2 vertebral body on the right with mild pathologic fracture unchanged from the prior study. Metastatic disease is left scapula with pathologic fracture. Fracture not seen on prior studies. Upper chest: Lung apices clear bilaterally. Port-A-Cath tip right jugular vein. Other: None IMPRESSION: 1. 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 This is compressing the right jugular vein with thrombus in the vein above and below the lymph node. Cystic retropharyngeal lymph node on the right is stable 2. Progressive soft tissue thickening in the larynx bilaterally suspicious for tumor. Recommend direct visualization to differentiate from radiation change. 3. Metastatic disease at T2 is unchanged 4. Metastatic deposit left scapula. There is now a pathologic fracture of the scapula. Electronically Signed   By: CFranchot GalloM.D.   On: 06/29/2020 16:37        ASSESSMENT & PLAN:  1. [place]arynx cancer ([facility]   2. 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[facility]   3. Squamous cell carcinoma of left lung ([facility]   4. Encounter for antineoplastic chemotherapy   5.  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 Proceed with cisplatin on 08/31/2020 Continue nystatin oral rinse swish +/- swallow for thrush prophylaxis.   Odynophagia Continue sucralfate.  Prescription was refilled.  #Left upper lobe squamous cell carcinoma,  stage I Finished SBRT on 02/07/2020.  Monitor disease status. #Right jugular vein thrombus, continue Eliquis 2.5 mg twice daily. #chronic hypomagnesemia,   Continue slow magnesium 1 tablet twice daily.   #Weight loss, continue nutrition supplementation.  He has gained 2 pounds since last week. All questions were answered. The patient knows to call the clinic with any problems questions or concerns.   Return of visit: lab/MD/cisplatin 1 week  Earlie Server, MD, PhD Hematology Oncology [facility] at [facility] Pager- [phone] 08/30/2020

## 2020-08-30 NOTE — Progress Notes (Signed)
Follow up oncology visit; Appetite is diminished. 3 ensure per day every day. B/B WNLs.

## 2020-08-31 ENCOUNTER — Ambulatory Visit: Payer: Medicare Other

## 2020-08-31 ENCOUNTER — Inpatient Hospital Stay: Payer: Medicare Other

## 2020-08-31 ENCOUNTER — Telehealth: Payer: Self-pay

## 2020-08-31 ENCOUNTER — Ambulatory Visit
Admission: RE | Admit: 2020-08-31 | Discharge: 2020-08-31 | Disposition: A | Discharge status: Home / Self Care | Payer: Medicare Other | Source: Ambulatory Visit | Attending: Radiation Oncology | Admitting: Radiation Oncology

## 2020-08-31 VITALS — BP 151/76 | HR 71 | Temp 97.6°F

## 2020-08-31 DIAGNOSIS — Z510 Encounter for antineoplastic radiation therapy: Secondary | ICD-10-CM | POA: Diagnosis not present

## 2020-08-31 DIAGNOSIS — C329 Malignant neoplasm of larynx, unspecified: Secondary | ICD-10-CM

## 2020-08-31 MED ORDER — SODIUM CHLORIDE 0.9 % IV SOLN: Freq: Once | INTRAVENOUS | Status: DC

## 2020-08-31 MED ORDER — SODIUM CHLORIDE 0.9 % IV SOLN
10.0000 mg | Freq: Once | INTRAVENOUS | Status: AC
  Administered 2020-08-31: 10 mg via INTRAVENOUS
  Filled 2020-08-31: qty 10

## 2020-08-31 MED ORDER — POTASSIUM CHLORIDE IN NACL 20-0.9 MEQ/L-% IV SOLN
Freq: Once | INTRAVENOUS | Status: AC
  Filled 2020-08-31: qty 1000

## 2020-08-31 MED ORDER — PALONOSETRON HCL INJECTION 0.25 MG/5ML
0.2500 mg | Freq: Once | INTRAVENOUS | Status: AC
  Administered 2020-08-31: 0.25 mg via INTRAVENOUS
  Filled 2020-08-31: qty 5

## 2020-08-31 MED ORDER — MAGNESIUM SULFATE 2 GM/50ML IV SOLN
2.0000 g | Freq: Once | INTRAVENOUS | Status: AC
  Administered 2020-08-31: 2 g via INTRAVENOUS
  Filled 2020-08-31: qty 50

## 2020-08-31 MED ORDER — HEPARIN SOD (PORK) LOCK FLUSH 100 UNIT/ML IV SOLN
500.0000 [IU] | Freq: Once | INTRAVENOUS | Status: AC | PRN
  Administered 2020-08-31: 500 [IU]
  Filled 2020-08-31: qty 5

## 2020-08-31 MED ORDER — SODIUM CHLORIDE 0.9 % IV SOLN
30.0000 mg/m2 | Freq: Once | INTRAVENOUS | Status: AC
  Administered 2020-08-31: 48 mg via INTRAVENOUS
  Filled 2020-08-31: qty 48

## 2020-08-31 MED ORDER — SODIUM CHLORIDE 0.9 % IV SOLN
Freq: Once | INTRAVENOUS | Status: AC
  Filled 2020-08-31: qty 250

## 2020-08-31 MED ORDER — SODIUM CHLORIDE 0.9 % IV SOLN
150.0000 mg | Freq: Once | INTRAVENOUS | Status: AC
  Administered 2020-08-31: 150 mg via INTRAVENOUS
  Filled 2020-08-31: qty 150

## 2020-08-31 NOTE — Patient Instructions (Signed)
[facility] ONCOL[facility]  Discharge Instructions: Thank you for choosing [facility] to provide your oncology and hematology care.  If you have a lab appointment with the [facility], please go directly to the [facility] and check in at the registration area.  Wear comfortable clothing and clothing appropriate for easy access to any Portacath or PICC line.   We strive to give you quality time with your provider. You may need to reschedule your appointment if you arrive late (15 or more minutes).  Arriving late affects you and other patients whose appointments are after yours.  Also, if you miss three or more appointments without notifying the office, you may be dismissed from the clinic at the provider's discretion.      For prescription refill requests, have your pharmacy contact our office and allow 72 hours for refills to be completed.    Today you received the following chemotherapy and/or immunotherapy agents : Cisplatin    To help prevent nausea and vomiting after your treatment, we encourage you to take your nausea medication as directed.  BELOW ARE SYMPTOMS THAT SHOULD BE REPORTED IMMEDIATELY: *FEVER GREATER THAN 100.4 F (38 C) OR HIGHER *CHILLS OR SWEATING *NAUSEA AND VOMITING THAT IS NOT CONTROLLED WITH YOUR NAUSEA MEDICATION *UNUSUAL SHORTNESS OF BREATH *UNUSUAL BRUISING OR BLEEDING *URINARY PROBLEMS (pain or burning when urinating, or frequent urination) *BOWEL PROBLEMS (unusual diarrhea, constipation, pain near the anus) TENDERNESS IN MOUTH AND THROAT WITH OR WITHOUT PRESENCE OF ULCERS (sore throat, sores in mouth, or a toothache) UNUSUAL RASH, SWELLING OR PAIN  UNUSUAL VAGINAL DISCHARGE OR ITCHING   Items with * indicate a potential emergency and should be followed up as soon as possible or go to the Emergency Department if any problems should occur.  Please show the CHEMOTHERAPY ALERT CARD or IMMUNOTHERAPY ALERT CARD at check-in  to the Emergency Department and triage nurse.  Should you have questions after your visit or need to cancel or reschedule your appointment, please contact [facility]  [phone] and follow the prompts.  Office hours are 8:00 a.m. to 4:30 p.m. Monday - Friday. Please note that voicemails left after 4:00 p.m. may not be returned until the following business day.  We are closed weekends and major holidays. You have access to a nurse at all times for urgent questions. Please call the main number to the clinic [phone] and follow the prompts.  For any non-urgent questions, you may also contact your provider using MyChart. We now offer e-Visits for anyone 6 and older to request care online for non-urgent symptoms. For details visit [email or web address].   Also download the MyChart app! Go to the app store, search "MyChart", open the app, select [facility], and log in with your MyChart username and password.  Due to Covid, a mask is required upon entering the hospital/clinic. If you do not have a mask, one will be given to you upon arrival. For doctor visits, patients may have 1 support person aged 80 or older with them. For treatment visits, patients cannot have anyone with them due to current Covid guidelines and our immunocompromised population.

## 2020-09-01 ENCOUNTER — Ambulatory Visit
Admission: RE | Admit: 2020-09-01 | Discharge: 2020-09-01 | Disposition: A | Discharge status: Home / Self Care | Payer: Medicare Other | Source: Ambulatory Visit | Attending: Radiation Oncology | Admitting: Radiation Oncology

## 2020-09-01 ENCOUNTER — Inpatient Hospital Stay: Payer: Medicare Other

## 2020-09-01 DIAGNOSIS — Z510 Encounter for antineoplastic radiation therapy: Secondary | ICD-10-CM | POA: Diagnosis not present

## 2020-09-04 ENCOUNTER — Ambulatory Visit
Admission: RE | Admit: 2020-09-04 | Discharge: 2020-09-04 | Disposition: A | Discharge status: Home / Self Care | Payer: Medicare Other | Source: Ambulatory Visit | Attending: Radiation Oncology | Admitting: Radiation Oncology

## 2020-09-04 ENCOUNTER — Inpatient Hospital Stay: Payer: Medicare Other

## 2020-09-04 DIAGNOSIS — Z510 Encounter for antineoplastic radiation therapy: Secondary | ICD-10-CM | POA: Diagnosis not present

## 2020-09-05 ENCOUNTER — Inpatient Hospital Stay: Payer: Medicare Other

## 2020-09-05 ENCOUNTER — Ambulatory Visit
Admission: RE | Admit: 2020-09-05 | Discharge: 2020-09-05 | Disposition: A | Discharge status: Home / Self Care | Payer: Medicare Other | Source: Ambulatory Visit | Attending: Radiation Oncology | Admitting: Radiation Oncology

## 2020-09-05 DIAGNOSIS — Z510 Encounter for antineoplastic radiation therapy: Secondary | ICD-10-CM | POA: Diagnosis not present

## 2020-09-06 ENCOUNTER — Ambulatory Visit: Payer: Medicare Other | Admitting: Oncology

## 2020-09-06 ENCOUNTER — Ambulatory Visit
Admission: RE | Admit: 2020-09-06 | Discharge: 2020-09-06 | Disposition: A | Discharge status: Home / Self Care | Payer: Medicare Other | Source: Ambulatory Visit | Attending: Radiation Oncology | Admitting: Radiation Oncology

## 2020-09-06 ENCOUNTER — Inpatient Hospital Stay: Payer: Medicare Other

## 2020-09-06 ENCOUNTER — Other Ambulatory Visit: Payer: Self-pay

## 2020-09-06 ENCOUNTER — Inpatient Hospital Stay (HOSPITAL_BASED_OUTPATIENT_CLINIC_OR_DEPARTMENT_OTHER): Payer: Medicare Other | Admitting: Oncology

## 2020-09-06 ENCOUNTER — Encounter: Payer: Self-pay | Admitting: Oncology

## 2020-09-06 ENCOUNTER — Other Ambulatory Visit: Payer: Medicare Other

## 2020-09-06 ENCOUNTER — Ambulatory Visit: Payer: Medicare Other

## 2020-09-06 VITALS — BP 143/71 | HR 71 | Temp 97.6°F | Wt 104.0 lb

## 2020-09-06 DIAGNOSIS — C3492 Malignant neoplasm of unspecified part of left bronchus or lung: Secondary | ICD-10-CM | POA: Diagnosis not present

## 2020-09-06 DIAGNOSIS — C329 Malignant neoplasm of larynx, unspecified: Secondary | ICD-10-CM

## 2020-09-06 DIAGNOSIS — Z5111 Encounter for antineoplastic chemotherapy: Secondary | ICD-10-CM

## 2020-09-06 DIAGNOSIS — Z95828 Presence of other vascular implants and grafts: Secondary | ICD-10-CM

## 2020-09-06 DIAGNOSIS — C109 Malignant neoplasm of oropharynx, unspecified: Secondary | ICD-10-CM | POA: Diagnosis not present

## 2020-09-06 DIAGNOSIS — F1027 Alcohol dependence with alcohol-induced persisting dementia: Secondary | ICD-10-CM

## 2020-09-06 DIAGNOSIS — Z510 Encounter for antineoplastic radiation therapy: Secondary | ICD-10-CM | POA: Diagnosis not present

## 2020-09-06 DIAGNOSIS — R634 Abnormal weight loss: Secondary | ICD-10-CM

## 2020-09-06 LAB — COMPREHENSIVE METABOLIC PANEL
ALT: 28 U/L (ref 0–44)
AST: 21 U/L (ref 15–41)
Albumin: 3.2 g/dL — ABNORMAL LOW (ref 3.5–5.0)
Alkaline Phosphatase: 63 U/L (ref 38–126)
Anion gap: 8 (ref 5–15)
BUN: 21 mg/dL (ref 8–23)
CO2: 25 mmol/L (ref 22–32)
Calcium: 8.6 mg/dL — ABNORMAL LOW (ref 8.9–10.3)
Chloride: 103 mmol/L (ref 98–111)
Creatinine, Ser: 0.7 mg/dL (ref 0.61–1.24)
GFR, Estimated: >60 mL/min (ref >60)
Glucose, Bld: 112 mg/dL — ABNORMAL HIGH (ref 70–99)
Potassium: 3.4 mmol/L — ABNORMAL LOW (ref 3.5–5.1)
Sodium: 136 mmol/L (ref 135–145)
Total Bilirubin: 0.4 mg/dL (ref 0.3–1.2)
Total Protein: 6.1 g/dL — ABNORMAL LOW (ref 6.5–8.1)

## 2020-09-06 LAB — CBC WITH DIFFERENTIAL/PLATELET
Abs Immature Granulocytes: 0.11 10*3/uL — ABNORMAL HIGH (ref 0.00–0.07)
Basophils Absolute: 0 10*3/uL (ref 0.0–0.1)
Basophils Relative: 0 %
Eosinophils Absolute: 0 10*3/uL (ref 0.0–0.5)
Eosinophils Relative: 0 %
HCT: 29.9 % — ABNORMAL LOW (ref 39.0–52.0)
Hemoglobin: 9.8 g/dL — ABNORMAL LOW (ref 13.0–17.0)
Immature Granulocytes: 3 %
Lymphocytes Relative: 13 %
Lymphs Abs: 0.5 10*3/uL — ABNORMAL LOW (ref 0.7–4.0)
MCH: 29.8 pg (ref 26.0–34.0)
MCHC: 32.8 g/dL (ref 30.0–36.0)
MCV: 90.9 fL (ref 80.0–100.0)
Monocytes Absolute: 0.8 10*3/uL (ref 0.1–1.0)
Monocytes Relative: 19 %
Neutro Abs: 2.7 10*3/uL (ref 1.7–7.7)
Neutrophils Relative %: 65 %
Platelets: 186 10*3/uL (ref 150–400)
RBC: 3.29 MIL/uL — ABNORMAL LOW (ref 4.22–5.81)
RDW: 16.8 % — ABNORMAL HIGH (ref 11.5–15.5)
WBC: 4.1 10*3/uL (ref 4.0–10.5)
nRBC: 0 % (ref 0.0–0.2)

## 2020-09-06 LAB — MAGNESIUM: Magnesium: 1.8 mg/dL (ref 1.7–2.4)

## 2020-09-06 MED ORDER — PALONOSETRON HCL INJECTION 0.25 MG/5ML
0.2500 mg | Freq: Once | INTRAVENOUS | Status: AC
  Administered 2020-09-06: 0.25 mg via INTRAVENOUS
  Filled 2020-09-06: qty 5

## 2020-09-06 MED ORDER — SODIUM CHLORIDE 0.9 % IV SOLN
Freq: Once | INTRAVENOUS | Status: AC
Start: 2020-09-06 — End: 2020-09-06
  Administered 2020-09-06: 20 mL via INTRAVENOUS
  Filled 2020-09-06: qty 250

## 2020-09-06 MED ORDER — SODIUM CHLORIDE 0.9 % IV SOLN
150.0000 mg | Freq: Once | INTRAVENOUS | Status: AC
  Administered 2020-09-06: 150 mg via INTRAVENOUS
  Filled 2020-09-06: qty 150

## 2020-09-06 MED ORDER — SODIUM CHLORIDE 0.9 % IV SOLN
10.0000 mg | Freq: Once | INTRAVENOUS | Status: AC
  Administered 2020-09-06: 10 mg via INTRAVENOUS
  Filled 2020-09-06: qty 10

## 2020-09-06 MED ORDER — HEPARIN SOD (PORK) LOCK FLUSH 100 UNIT/ML IV SOLN
500.0000 [IU] | Freq: Once | INTRAVENOUS | Status: AC | PRN
  Administered 2020-09-06: 500 [IU]
  Filled 2020-09-06: qty 5

## 2020-09-06 MED ORDER — HEPARIN SOD (PORK) LOCK FLUSH 100 UNIT/ML IV SOLN
INTRAVENOUS | Status: AC
  Filled 2020-09-06: qty 5

## 2020-09-06 MED ORDER — MAGNESIUM SULFATE 2 GM/50ML IV SOLN
2.0000 g | Freq: Once | INTRAVENOUS | Status: AC
  Administered 2020-09-06: 2 g via INTRAVENOUS
  Filled 2020-09-06: qty 50

## 2020-09-06 MED ORDER — POTASSIUM CHLORIDE IN NACL 20-0.9 MEQ/L-% IV SOLN
Freq: Once | INTRAVENOUS | Status: AC
  Filled 2020-09-06: qty 1000

## 2020-09-06 MED ORDER — SODIUM CHLORIDE 0.9 % IV SOLN
30.0000 mg/m2 | Freq: Once | INTRAVENOUS | Status: AC
  Administered 2020-09-06: 48 mg via INTRAVENOUS
  Filled 2020-09-06: qty 48

## 2020-09-06 NOTE — Progress Notes (Signed)
DISCONTINUE OFF PATHWAY REGIMEN - Head and Neck   [id number]:Cisplatin 40 mg/m2 IV D1 q7 Days + RT:   A cycle is every 7 days:     Cisplatin   **Always confirm dose/schedule in your pharmacy ordering system**  REASON: Other Reason PRIOR TREATMENT: Off Pathway: Cisplatin 40 mg/m2 IV D1 q7 Days + RT TREATMENT RESPONSE: Unable to Evaluate  START ON PATHWAY REGIMEN - Head and Neck     A cycle is every 21 days:     Pembrolizumab   **Always confirm dose/schedule in your pharmacy ordering system**  Patient Characteristics: Larynx, Metastatic, First Line, Prior Platinum-Based Chemoradiation within 6 Months, Candidate for Checkpoint Inhibitor Disease Classification: Larynx AJCC T Category: [place] AJCC 8 Stage Grouping: IVC Therapeutic Status: Metastatic Disease AJCC N Category: NX AJCC M Category: M1 Line of Therapy: First Line Prior Platinum Status: Prior Platinum-Based Chemoradiation within 6 Months Checkpoint Inhibitor Candidacy: Candidate for Checkpoint Inhibitor Intent of Therapy: Non-Curative / Palliative Intent, Discussed with Patient

## 2020-09-06 NOTE — Progress Notes (Signed)
Hematology/Oncology progress note [facility] Telephone:([phone] [phone]   Patient Care Team: Casilda Carls, MD as PCP - General (Internal Medicine) Earlie Server, [name], [name]  HISTORY OF PRESENTING ILLNESS:  Gregory Burton is a  80 y.o.  male with PMH listed below was seen in consultation at the request of  Casilda Carls, MD  for evaluation of lymph node.  Patient moved from [place] to [place] for about 3 weeks. He has noticed a neck knot on the right side of his neck. The knot is sore and makes him to cough and he feels it when he swallows. No swallowing difficulty.  Patient was accompanied by his wife. She reports that patient's previous PCP has done neck sonogram for evaluation.  Patient also had right lower molar tooth extraction done a few weeks before he noticed  He also took a course of antibiotics.  Due to his tooth pain, his appetite has decreased and he has lost weight loss, about 20 pounds, he can not specify the time frame.   Alcoholism History of prostate cancer. S/p Radiation/seed, he follows up with [facility].   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  PD-L1-IHC 40%, no targetable mutations. # 09/07/2019 chemotherapy [cisplatin] and radiation.   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  Interval decrease of the  posterior left upper lobe pleural-based nodule with interval decrease in size of the tiny adjacent satellite nodule.  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  No recurrent tumor in right piriform sinus.  Right lateral pharyngeal lymph node is stable.  No new or recurrent adenopathy.Suspect metastasis.  I recommend patient to obtain PET scan restaging.  I discussed about the plan of re-biopsy of either the paraspinal soft tissue mass versus other more feasible hypermetabolic sites detected on PET scan. Patient was seen by Dr. Kennyth Lose and opted to proceed with palliative radiation as soon as possible.  PET scan cannot be arranged prior to the radiation so the PET scan was canceled. 04/20/2020 -04/25/2020, palliative radiation to thoracic spine  05/10/2020, MRI thoracic spine was obtained for worsening of back pain and left shoulder pain.  Images showed metastatic disease throughout almost the entire T2 with and associated mild superior endplate compression fracture.  Abnormal signal in the anterior, inferior endplate of T1, superior aspect of the T3.  Small metastatic deposit in T12 is also noted. Case was also discussed on multidisciplinary tumor board on 05/25/2020 05/22/2020, PET scan showed new hypermetabolic lytic bone metastasis in the left scapular near the glenoid and in the T2 vertebral body.  Persistent hypermetabolic right lateral retropharyngeal and right level 3 neck lymph node metastasis.  Peripheral left upper lobe pulmonary nodule is mildly decreased with new surrounding mild platelike postradiation changes..  No residual recurrent neoplasm at the site of vomiting in the right piriform sinus  06/07/2020 -06/09/2020 patient underwent palliative radiation to left  scapular April 2022 seen by [name] and flexible laryngoscope examination showed new lesion at the right false vocal cord.  Case was discussed on tumor board on 06/22/2020. [name] recommend additional CT  soft tissue neck with contrast for additional evaluation. 06/29/2020 CT images were reviewed and discussed with patient. There is new malignant lymph node in the right mid neck at the level of the hyoid, this compressing the right jugular vein with thrombus in the vein above and below the lymph node.  Cystic retropharyngeal lymph node on the right is stable.  Progressive soft tissue thickening in the larynx bilaterally suspicious for tumor.  Metastatic disease at the T2 is unchanged.  Metastatic deposit left scapula there is now a pathological fracture of the scapular. CT scan findings were discussed with ENT [name] via secure chat.  [name] feels surgery is not feasible.  The larynx lesion currently does not compromise his airway however further progression may in the future  07/10/2020 Seen by Radonc [name] who would offer salvage radiation to his larynx as well as neck adenopathy. Would like low dose chemotherapy as sensitizer.    Patient was also referred to establish care with Dr. Georgiann Cocker at [facility] for kyphoplasty.  Patient's wife reports that they were never contacted and she called Dr. Georgiann Cocker office with no success.      INTERVAL HISTORY Gregory Burton is a 80 y.o. male who has above history reviewed by me today presents for follow-up of head and neck cancer and lung cancer Patient was accompanied by wife. Patient is on concurrent chemoradiation.   Radiation and chemotherapy were held for 1 week and restarted this week Overall is doing slightly better.  He is drinking nutrition supplements and has gained 2 pounds.  Patient reports the swelling is slightly better.  No nausea vomiting, diarrhea,  Review of Systems  Constitutional:  Positive for fatigue. Negative for appetite change, chills, fever and unexpected weight change.  HENT:   Positive for hearing loss. Negative for voice change.   Eyes:  Negative for eye problems and icterus.  Respiratory:  Negative for chest  tightness, cough and shortness of breath.   Cardiovascular:  Negative for chest pain and leg swelling.  Gastrointestinal:  Negative for abdominal distention, abdominal pain, blood in stool and nausea.  Endocrine: Negative for hot flashes.  Genitourinary:  Negative for difficulty urinating, dysuria and frequency.   Musculoskeletal:  Positive for arthralgias.  Skin:  Negative for itching and rash.  Neurological:  Negative for extremity weakness, light-headedness and numbness.  Hematological:  Negative for adenopathy. Does not bruise/bleed easily.  Psychiatric/Behavioral:  Negative for confusion.        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   Prostate CA ([place]) 2008   Squamous cell lung cancer ([place]) 06/23/2019    SURGICAL HISTORY: Past Surgical History:  Procedure Laterality Date   BRAIN SURGERY  2012   HERNIA REPAIR     PORTA CATH INSERTION N/A 07/27/2019   Procedure: PORTA CATH INSERTION;  Surgeon: Katha Cabal, MD;  Location: [place] [facility];  Service: Cardiovascular;  Laterality: N/A;    SOCIAL HISTORY: Social History   Socioeconomic History  Marital status: Married    Spouse name: Not on file   Number of children: Not on file   Years of education: Not on file   Highest education level: Not on file  Occupational History   Not on file  Tobacco Use   Smoking status: Former    Years: 21.00    Pack years: 0.00    Types: Cigarettes    Quit date: 05/11/2008    Years since quitting: 12.3   Smokeless tobacco: Never  Vaping Use   Vaping Use: Never used  Substance and Sexual Activity   Alcohol use: Yes    Comment: 0.5 pint liquor a week   Drug use: Never   Sexual activity: Not Currently  Other Topics  Concern   Not on file  Social History Narrative   Lives at home with wife. Wife states he has some dementia but is able to sign his own consent.   Social Determinants of Health   Financial Resource Strain: Not on file  Food Insecurity: Not on file  Transportation Needs: Not on file  Physical Activity: Not on file  Stress: Not on file  Social Connections: Not on file  Intimate Partner Violence: Not on file    FAMILY HISTORY: History reviewed. No pertinent family history.  ALLERGIES:  is allergic to enalapril.  MEDICATIONS:  Current Outpatient Medications  Medication Sig Dispense Refill   atorvastatin (LIPITOR) 20 MG tablet Take 20 mg by mouth daily.     dexamethasone (DECADRON) 4 MG tablet Take 2 tablets (8 mg total) by mouth daily. Take daily x 3 days starting the day after cisplatin chemotherapy. Take with food. 30 tablet 1   donepezil (ARICEPT) 10 MG tablet Take 10 mg by mouth at bedtime.     dorzolamide-timolol (COSOPT) 22.3-6.8 MG/ML ophthalmic solution Place 1 drop into both eyes 2 (two) times daily.      ELIQUIS 2.5 MG TABS tablet TAKE 1 TABLET BY MOUTH TWICE A DAY 60 tablet 1   folic acid (FOLVITE) 1 MG tablet Take 1 mg by mouth daily.     latanoprost (XALATAN) 0.005 % ophthalmic solution Place 1 drop into both eyes at bedtime.      lidocaine-prilocaine (EMLA) cream Apply 1 application topically as needed. Apply small amount to port site approx 1-2 hours prior to appointment. 30 g 1   MAG64 64 MG TBEC TAKE 1 TABLET (64 MG TOTAL) BY MOUTH 2 (TWO) TIMES DAILY. 180 tablet 0   megestrol (MEGACE) 40 MG tablet TAKE 1 TABLET BY MOUTH TWICE A DAY 60 tablet 0   Multiple Vitamins-Minerals (CENTRUM ADULTS PO) Take by mouth.     nystatin (MYCOSTATIN) 100000 UNIT/ML suspension Take 5 mLs (500,000 Units total) by mouth 4 (four) times daily. 473 mL 1   oxyCODONE (OXY IR/ROXICODONE) 5 MG immediate release tablet Take 1 tablet (5 mg total) by mouth every 4 (four) hours as needed for severe  pain. 90 tablet 0   sucralfate (CARAFATE) 1 GM/10ML suspension Take 10 mLs (1 g total) by mouth 4 (four) times daily -  with meals and at bedtime. 420 mL 2   Thiamine HCl (VITAMIN B-1 PO) Take 100 mg by mouth daily.     pantoprazole (PROTONIX) 20 MG tablet Take 1 tablet (20 mg total) by mouth 2 (two) times daily. (Patient not taking: No sig reported) 90 tablet 0   No current facility-administered medications for this visit.   Facility-Administered Medications Ordered in Other Visits  Medication Dose Route Frequency  Provider Last Rate Last Admin   CISplatin (PLATINOL) 48 mg in sodium chloride 0.9 % 250 mL chemo infusion  30 mg/m2 (Order-Specific) Intravenous Once Earlie Server, MD       fosaprepitant (EMEND) 150 mg in sodium chloride 0.9 % 145 mL IVPB  150 mg Intravenous Once Earlie Server, MD 450 mL/hr at 09/06/20 1212 150 mg at 09/06/20 1212   heparin lock flush 100 unit/mL  500 Units Intracatheter Once PRN Earlie Server, MD       sodium chloride flush (NS) 0.9 % injection 10 mL  10 mL Intravenous PRN Earlie Server, MD   10 mL at 03/01/20 0812     PHYSICAL EXAMINATION: ECOG PERFORMANCE STATUS: 1 - Symptomatic but completely ambulatory Vitals:   09/06/20 0929  BP: (!) 143/71  Pulse: 71  Temp: 97.6 F (36.4 C)  SpO2: 100%   Filed Weights   09/06/20 0929  Weight: 104 lb (47.2 kg)    Physical Exam Constitutional:      General: He is not in acute distress. HENT:     Head: Normocephalic and atraumatic.  Eyes:     General: No scleral icterus. Cardiovascular:     Rate and Rhythm: Normal rate and regular rhythm.     Heart sounds: Normal heart sounds.  Pulmonary:     Effort: Pulmonary effort is normal. No respiratory distress.     Breath sounds: No wheezing.  Abdominal:     General: Bowel sounds are normal. There is no distension.     Palpations: Abdomen is soft.  Musculoskeletal:        General: No deformity.     Cervical back: Normal range of motion and neck supple.     Comments: Left shoulder  pain has improved  Lymphadenopathy:     Cervical: Cervical adenopathy present.  Skin:    General: Skin is warm and dry.     Findings: No erythema or rash.  Neurological:     Mental Status: He is alert. Mental status is at baseline.     Cranial Nerves: No cranial nerve deficit.     Coordination: Coordination normal.    LABORATORY DATA:  I have reviewed the data as listed Lab Results  Component Value Date   WBC 4.1 09/06/2020   HGB 9.8 (L) 09/06/2020   HCT 29.9 (L) 09/06/2020   MCV 90.9 09/06/2020   PLT 186 09/06/2020   Recent Labs    09/08/19 0832 11/15/19 0818 12/31/19 0927 08/23/20 0909 08/30/20 1352 09/06/20 0902  NA 140 141   < > 136 139 136  K 4.2 4.1   < > 3.8 4.1 3.4*  CL 107 109   < > 105 103 103  CO2 25 24   < > _0 GLUCOSE 125* 98   < > 133* 90 112*  BUN 23 19   < > _1 CREATININE 0.98 0.88   < > 0.69 0.79 0.70  CALCIUM 9.0 9.2   < > 8.6* 9.1 8.6*  GFRNONAA >60 >60   < > >60 >60 >60  GFRAA >60 >60  --   --   --   --   PROT 6.8 7.0   < > 5.7* 6.3* 6.1*  ALBUMIN 3.2* 3.9   < > 2.9* 3.3* 3.2*  AST 16 17   < > _2 ALT 13 19   < > _3 ALKPHOS 56 41   < > 54 69 63  BILITOT 0.5 1.0   < > 0.7 0.6 0.4   < > = values in this interval not displayed.    Iron/TIBC/Ferritin/ %Sat No results found for: IRON, TIBC, FERRITIN, IRONPCTSAT    RADIOGRAPHIC STUDIES: I have personally reviewed the radiological images as listed and agreed with the findings in the report. No results found. CT SOFT TISSUE NECK W CONTRAST  Result Date: 06/29/2020 CLINICAL DATA:  Current head neck cancer. 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 Worsening hoarseness. EXAM: CT NECK WITH CONTRAST TECHNIQUE: Multidetector CT imaging of the neck was performed using the standard protocol following the bolus administration of intravenous contrast. CONTRAST:  3m OMNIPAQUE IOHEXOL 300 MG/ML  SOLN COMPARISON:  CT neck 03/31/2020 FINDINGS: Pharynx and  larynx: Progressive thickening of the vocal cords bilaterally. This shows irregular enhancement and could be due to tumor versus radiation change. Direct visualization is recommended. Epiglottis normal. Oropharynx negative for recurrent tumor. Salivary glands: Parotid normal bilaterally. Atrophic submandibular gland bilaterally without acute abnormality. Thyroid: Negative Lymph nodes: 17 mm lymph node in the right mid neck at the level of the hyoid bone is new since the prior study. This has irregular enhancement and is consistent with metastatic disease. This is compressing the right jugular vein with thrombus above and below the tumor Cystic retropharyngeal lymph node on the right 14 mm, unchanged from the prior study. Vascular: Thrombus in the right jugular vein due to compression by malignant lymph node in the right mid neck. Severe atherosclerotic disease in the carotid artery bilaterally which remain patent bilaterally. Atherosclerotic calcification aortic arch. Limited intracranial: Negative Visualized orbits: Chronic fracture right medial orbit unchanged. Mastoids and visualized paranasal sinuses: Negative Skeleton: Lytic destructive lesion T2 vertebral body on the right with mild pathologic fracture unchanged from the prior study. Metastatic disease is left scapula with pathologic fracture. Fracture not seen on prior studies. Upper chest: Lung apices clear bilaterally. Port-A-Cath tip right jugular vein. Other: None IMPRESSION: 1. 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 This is compressing the right jugular vein with thrombus in the vein above and below the lymph node. Cystic retropharyngeal lymph node on the right is stable 2. Progressive soft tissue thickening in the larynx bilaterally suspicious for tumor. Recommend direct visualization to differentiate from radiation change. 3. Metastatic disease at T2 is unchanged 4. Metastatic deposit left scapula. There is now a  pathologic fracture of the scapula. Electronically Signed   By: CFranchot GalloM.D.   On: 06/29/2020 16:37        ASSESSMENT & PLAN:  1. Larynx cancer ([place]   2. 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 Squamous cell carcinoma of left lung ([place]   4. Encounter for antineoplastic chemotherapy   5. 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 Weight loss   7. Port-A-Cath in place   Cancer Staging Larynx cancer ([facility] Staging form: Larynx - Glottis, AJCC 8th Edition - Clinical: Stage IVC (cT1, cNX, cM1) - Signed by YEarlie Server MD on 07/26/2020  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[facility] Staging form: Pharynx - P16 Negative Oropharynx, AJCC 8th Edition - Clinical stage from 06/23/2019: Stage IVB (cT4b, cN2b, cM0, p16-) - Signed by YEarlie Server MD on 06/23/2019  Squamous cell lung cancer ([facility] Staging form: Lung, AJCC 8th Edition - Clinical: cT1, cN0, cM0 - Signed by YEarlie Server MD on 07/19/2019   #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 Proceed with cisplatin today. Continue nystatin oral rinse swish +/- swallow for thrush prophylaxis. Discussed with patient and her wife about future treatments. Is not a candidate for aggressive systemic chemotherapy treatments. I recommend single agent Keytruda.  CPS 40% on his oropharyngeal SCC specimen.  I discussed the mechanism of action and rationale of using immunotherapy.  The goal of therapy is palliative; and length of treatments are likely ongoing/based upon the results of the scans. Discussed the potential side effects of immunotherapy including but not limited to diarrhea; skin rash; respiratory failure, kidney failure, mental status change, elevated LFTs/liver  failure,endocrine abnormalities, acute deterioration  and even death,etc. Patient and wife voice understanding and agrees with proceeding with treatment.  Odynophagia Continue sucralfate.  Symptom is stable.  #Left upper lobe squamous cell carcinoma, stage I Finished SBRT on 02/07/2020.  Monitor disease status.  #Right jugular vein thrombus, continue Eliquis 2.5 mg twice daily. #chronic hypomagnesemia,   Continue slow magnesium 1 tablet twice daily.    #Weight loss, continue nutrition supplementation.    Weight has been all questions were answered. The patient knows to call the clinic with any problems questions or concerns.   Return of visit: lab/MD/Keytruda in 4 weeks  Earlie Server, MD, PhD Hematology Oncology [facility] at [facility] Pager- [phone] 09/06/2020

## 2020-09-06 NOTE — Patient Instructions (Signed)
[facility]  Discharge Instructions: Thank you for choosing [facility] to provide your oncology and hematology care.  If you have a lab appointment with the [facility], please go directly to the [facility] and check in at the registration area.  Wear comfortable clothing and clothing appropriate for easy access to any Portacath or PICC line.   We strive to give you quality time with your provider. You may need to reschedule your appointment if you arrive late (15 or more minutes).  Arriving late affects you and other patients whose appointments are after yours.  Also, if you miss three or more appointments without notifying the office, you may be dismissed from the clinic at the provider's discretion.      For prescription refill requests, have your pharmacy contact our office and allow 72 hours for refills to be completed.    Today you received the following chemotherapy and/or immunotherapy agents CISPLATIN      To help prevent nausea and vomiting after your treatment, we encourage you to take your nausea medication as directed.  BELOW ARE SYMPTOMS THAT SHOULD BE REPORTED IMMEDIATELY: *FEVER GREATER THAN 100.4 F (38 C) OR HIGHER *CHILLS OR SWEATING *NAUSEA AND VOMITING THAT IS NOT CONTROLLED WITH YOUR NAUSEA MEDICATION *UNUSUAL SHORTNESS OF BREATH *UNUSUAL BRUISING OR BLEEDING *URINARY PROBLEMS (pain or burning when urinating, or frequent urination) *BOWEL PROBLEMS (unusual diarrhea, constipation, pain near the anus) TENDERNESS IN MOUTH AND THROAT WITH OR WITHOUT PRESENCE OF ULCERS (sore throat, sores in mouth, or a toothache) UNUSUAL RASH, SWELLING OR PAIN  UNUSUAL VAGINAL DISCHARGE OR ITCHING   Items with * indicate a potential emergency and should be followed up as soon as possible or go to the Emergency Department if any problems should occur.  Please show the CHEMOTHERAPY ALERT CARD or IMMUNOTHERAPY ALERT CARD at check-in  to the Emergency Department and triage nurse.  Should you have questions after your visit or need to cancel or reschedule your appointment, please contact [facility]  [phone] and follow the prompts.  Office hours are 8:00 a.m. to 4:30 p.m. Monday - Friday. Please note that voicemails left after 4:00 p.m. may not be returned until the following business day.  We are closed weekends and major holidays. You have access to a nurse at all times for urgent questions. Please call the main number to the clinic [phone] and follow the prompts.  For any non-urgent questions, you may also contact your provider using MyChart. We now offer e-Visits for anyone 58 and older to request care online for non-urgent symptoms. For details visit [email or web address].   Also download the MyChart app! Go to the app store, search "MyChart", open the app, select [facility], and log in with your MyChart username and password.  Due to Covid, a mask is required upon entering the hospital/clinic. If you do not have a mask, one will be given to you upon arrival. For doctor visits, patients may have 1 support person aged 20 or older with them. For treatment visits, patients cannot have anyone with them due to current Covid guidelines and our immunocompromised population.   Cisplatin injection What is this medication? CISPLATIN (SIS pla tin) is a chemotherapy drug. It targets fast dividing cells, like cancer cells, and causes these cells to die. This medicine is used totreat many types of cancer like bladder, ovarian, and testicular cancers. This medicine may be used for other purposes; ask your health care  provider orpharmacist if you have questions. COMMON BRAND NAME(S): Platinol, Platinol -AQ What should I tell my care team before I take this medication? They need to know if you have any of these conditions: eye disease, vision problems hearing problems kidney disease low  blood counts, like white cells, platelets, or red blood cells tingling of the fingers or toes, or other nerve disorder an unusual or allergic reaction to cisplatin, carboplatin, oxaliplatin, other medicines, foods, dyes, or preservatives pregnant or trying to get pregnant breast-feeding How should I use this medication? This drug is given as an infusion into a vein. It is administered in a hospitalor clinic by a specially trained health care professional. Talk to your pediatrician regarding the use of this medicine in children.Special care may be needed. Overdosage: If you think you have taken too much of this medicine contact apoison control center or emergency room at once. NOTE: This medicine is only for you. Do not share this medicine with others. What if I miss a dose? It is important not to miss a dose. Call your doctor or health careprofessional if you are unable to keep an appointment. What may interact with this medication? This medicine may interact with the following medications: foscarnet certain antibiotics like amikacin, gentamicin, neomycin, polymyxin B, streptomycin, tobramycin, vancomycin This list may not describe all possible interactions. Give your health care provider a list of all the medicines, herbs, non-prescription drugs, or dietary supplements you use. Also tell them if you smoke, drink alcohol, or use illegaldrugs. Some items may interact with your medicine. What should I watch for while using this medication? Your condition will be monitored carefully while you are receiving this medicine. You will need important blood work done while you are taking thismedicine. This drug may make you feel generally unwell. This is not uncommon, as chemotherapy can affect healthy cells as well as cancer cells. Report any side effects. Continue your course of treatment even though you feel ill unless yourdoctor tells you to stop. This medicine may increase your risk of getting an  infection. Call your healthcare professional for advice if you get a fever, chills, or sore throat, or other symptoms of a cold or flu. Do not treat yourself. Try to avoid beingaround people who are sick. Avoid taking medicines that contain aspirin, acetaminophen, ibuprofen, naproxen, or ketoprofen unless instructed by your healthcare professional.These medicines may hide a fever. This medicine may increase your risk to bruise or bleed. Call your doctor orhealth care professional if you notice any unusual bleeding. Be careful brushing and flossing your teeth or using a toothpick because you may get an infection or bleed more easily. If you have any dental work done,tell your dentist you are receiving this medicine. Do not become pregnant while taking this medicine or for 14 months after stopping it. Women should inform their healthcare professional if they wish to become pregnant or think they might be pregnant. Men should not father a child while taking this medicine and for 11 months after stopping it. There is potential for serious side effects to an unborn child. Talk to your healthcareprofessional for more information. Do not breast-feed an infant while taking this medicine. This medicine has caused ovarian failure in some women. This medicine may make it more difficult to get pregnant. Talk to your healthcare professional if [place] concerned about your fertility. This medicine has caused decreased sperm counts in some men. This may make it more difficult to father a child. Talk to your healthcare professional  if youare concerned about your fertility. Drink fluids as directed while you are taking this medicine. This will helpprotect your kidneys. Call your doctor or health care professional if you get diarrhea. Do not treatyourself. What side effects may I notice from receiving this medication? Side effects that you should report to your doctor or health care professionalas soon as  possible: allergic reactions like skin rash, itching or hives, swelling of the face, lips, or tongue blurred vision changes in vision decreased hearing or ringing of the ears nausea, vomiting pain, redness, or irritation at site where injected pain, tingling, numbness in the hands or feet signs and symptoms of bleeding such as bloody or black, tarry stools; red or dark brown urine; spitting up blood or brown material that looks like coffee grounds; red spots on the skin; unusual bruising or bleeding from the eyes, gums, or nose signs and symptoms of infection like fever; chills; cough; sore throat; pain or trouble passing urine signs and symptoms of kidney injury like trouble passing urine or change in the amount of urine signs and symptoms of low red blood cells or anemia such as unusually weak or tired; feeling faint or lightheaded; falls; breathing problems Side effects that usually do not require medical attention (report to yourdoctor or health care professional if they continue or are bothersome): loss of appetite mouth sores muscle cramps This list may not describe all possible side effects. Call your doctor for medical advice about side effects. You may report side effects to FDA at1-800-FDA-1088. Where should I keep my medication? This drug is given in a hospital or clinic and will not be stored at home. NOTE: This sheet is a summary. It may not cover all possible information. If you have questions about this medicine, talk to your doctor, pharmacist, orhealth care provider.  2022 Elsevier/Gold Standard (2018-02-06 15:59:17)

## 2020-09-06 NOTE — Progress Notes (Signed)
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  Patient receiving radiation and chemotherapy.  Last treatment of both today.  Met with wife while patient in infusion as patient with dementia.  Wife reports that she has been monitoring intake of ensure plus daily and making sure he is drinking 3 a day.  Reports that patient's appetite has been good.  Enjoyed recent visit with sister and niece (came in from out of of town).      Medications: reviewed  Labs: reviewed  Anthropometrics:   Weight 104 lb today stable  104 lb 11.2 oz on 7/6 106 lb 6.4 oz on 6/15 107 lb on 6/8 109 lb on 6/1 112 lb on 4/1 118 lb on 2/8  NUTRITION DIAGNOSIS: Inadequate oral intake improving with weight gain   INTERVENTION:  Complimentary case of ensure plus given to patient today with coupons Continue 3 shakes per day at least. Wife will continue to monitor Continue high calorie, high protein foods     MONITORING, EVALUATION, GOAL: weight trends, intake   NEXT VISIT: Wednesday, August 10 during infusion  Batina Dougan B. Zenia Resides, [place], [place] Registered Dietitian [phone] (mobile)

## 2020-09-06 NOTE — Progress Notes (Signed)
Patient here for oncology follow-up appointment, expresses concerns of SOB & cough

## 2020-09-11 ENCOUNTER — Other Ambulatory Visit: Payer: Self-pay

## 2020-09-11 ENCOUNTER — Emergency Department
Admission: EM | Admit: 2020-09-11 | Discharge: 2020-09-11 | Disposition: A | Discharge status: Home / Self Care | Payer: Medicare Other | Attending: Emergency Medicine | Admitting: Emergency Medicine

## 2020-09-11 DIAGNOSIS — Z85830 Personal history of malignant neoplasm of bone: Secondary | ICD-10-CM | POA: Diagnosis not present

## 2020-09-11 DIAGNOSIS — Z85118 Personal history of other malignant neoplasm of bronchus and lung: Secondary | ICD-10-CM | POA: Insufficient documentation

## 2020-09-11 DIAGNOSIS — Z8546 Personal history of malignant neoplasm of prostate: Secondary | ICD-10-CM | POA: Insufficient documentation

## 2020-09-11 DIAGNOSIS — F0390 Unspecified dementia without behavioral disturbance: Secondary | ICD-10-CM | POA: Insufficient documentation

## 2020-09-11 DIAGNOSIS — Z87891 Personal history of nicotine dependence: Secondary | ICD-10-CM | POA: Insufficient documentation

## 2020-09-11 DIAGNOSIS — K625 Hemorrhage of anus and rectum: Secondary | ICD-10-CM | POA: Diagnosis not present

## 2020-09-11 DIAGNOSIS — F419 Anxiety disorder, unspecified: Secondary | ICD-10-CM | POA: Diagnosis not present

## 2020-09-11 DIAGNOSIS — Z8521 Personal history of malignant neoplasm of larynx: Secondary | ICD-10-CM | POA: Diagnosis not present

## 2020-09-11 DIAGNOSIS — Z7901 Long term (current) use of anticoagulants: Secondary | ICD-10-CM | POA: Insufficient documentation

## 2020-09-11 DIAGNOSIS — K921 Melena: Secondary | ICD-10-CM

## 2020-09-11 LAB — COMPREHENSIVE METABOLIC PANEL
ALT: 30 U/L (ref 0–44)
AST: 30 U/L (ref 15–41)
Albumin: 3.3 g/dL — ABNORMAL LOW (ref 3.5–5.0)
Alkaline Phosphatase: 60 U/L (ref 38–126)
Anion gap: 11 (ref 5–15)
BUN: 21 mg/dL (ref 8–23)
CO2: 27 mmol/L (ref 22–32)
Calcium: 9.1 mg/dL (ref 8.9–10.3)
Chloride: 100 mmol/L (ref 98–111)
Creatinine, Ser: 0.79 mg/dL (ref 0.61–1.24)
GFR, Estimated: >60 mL/min (ref >60)
Glucose, Bld: 117 mg/dL — ABNORMAL HIGH (ref 70–99)
Potassium: 3.9 mmol/L (ref 3.5–5.1)
Sodium: 138 mmol/L (ref 135–145)
Total Bilirubin: 0.9 mg/dL (ref 0.3–1.2)
Total Protein: 6.4 g/dL — ABNORMAL LOW (ref 6.5–8.1)

## 2020-09-11 LAB — CBC WITH DIFFERENTIAL/PLATELET
Abs Immature Granulocytes: 0.09 10*3/uL — ABNORMAL HIGH (ref 0.00–0.07)
Basophils Absolute: 0 10*3/uL (ref 0.0–0.1)
Basophils Relative: 0 %
Eosinophils Absolute: 0 10*3/uL (ref 0.0–0.5)
Eosinophils Relative: 0 %
HCT: 33.3 % — ABNORMAL LOW (ref 39.0–52.0)
Hemoglobin: 11.2 g/dL — ABNORMAL LOW (ref 13.0–17.0)
Immature Granulocytes: 1 %
Lymphocytes Relative: 6 %
Lymphs Abs: 0.6 10*3/uL — ABNORMAL LOW (ref 0.7–4.0)
MCH: 30.4 pg (ref 26.0–34.0)
MCHC: 33.6 g/dL (ref 30.0–36.0)
MCV: 90.5 fL (ref 80.0–100.0)
Monocytes Absolute: 0.8 10*3/uL (ref 0.1–1.0)
Monocytes Relative: 9 %
Neutro Abs: 7.5 10*3/uL (ref 1.7–7.7)
Neutrophils Relative %: 84 %
Platelets: 252 10*3/uL (ref 150–400)
RBC: 3.68 MIL/uL — ABNORMAL LOW (ref 4.22–5.81)
RDW: 17.6 % — ABNORMAL HIGH (ref 11.5–15.5)
WBC: 9 10*3/uL (ref 4.0–10.5)
nRBC: 0 % (ref 0.0–0.2)

## 2020-09-11 MED ORDER — LORAZEPAM 2 MG/ML IJ SOLN: 0.5000 mg | Freq: Once | INTRAMUSCULAR | Status: DC

## 2020-09-11 NOTE — ED Notes (Signed)
Pt continuously pressing hitting call bell, stating he is loosing mind, appears anxious. Verbal order 0.5mg  ativan IV Dr Cinda Quest

## 2020-09-11 NOTE — ED Notes (Signed)
Pt lights dimmed for comfort, repositioned in bed

## 2020-09-11 NOTE — ED Triage Notes (Addendum)
Pt to ED [facility] from home for anxiety, pt has stage 4 lung cancer and became anxious about what is going to happen to him. Denies shob or cp.  Last chemo last Wednesday Pt states he is also constipated and had some rectal bleeding, ems reports minimal blood noted Pt in NAD  Pt not answering all orientation questions appropriately, ems reports early signs of dementia

## 2020-09-11 NOTE — ED Notes (Signed)
Patient is resting comfortably.  Will continue monitor , holding Ativan at this time

## 2020-09-11 NOTE — ED Provider Notes (Signed)
[facility] Emergency Department Provider Note   ____________________________________________   Event Date/Time   First MD Initiated Contact with Patient 09/11/20 1714     (approximate)  I have reviewed the triage vital signs and the nursing notes.   HISTORY  Chief Complaint Anxiety    HPI Gregory Burton is a 80 y.o. male patient was at home.  He has stage IV lung cancer.ed10 With cancer constipated and had some rectal bleeding.  EMS reported minimal bright red blood in the toilet.  he became very anxious about what was going to happen to him.  His last chemo was on Wednesday.  Patient's wife comes in as well.  She provides a very accurate and detailed history of the last year when he was diagnosed with cancer just before leaving [place] to move here.  He has been treated by our cancer center Dr. Donella Stade and [name] and [name].  He is now getting a different kind of medicine which may be an immune modulating drug I am not sure.  Patient was somewhat constipated and got mag citrate today and had a stool with blood in it.  He is still somewhat anxious.        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   Dementia without behavioral disturbance ([place]) 07/05/2020   Bone metastasis ([place]) 05/26/2020   Neoplasm related pain 05/26/2020   Hypomagnesemia 05/26/2020   Dysuria 11/15/2019   Encounter for antineoplastic chemotherapy 08/11/2019   Weight loss 08/11/2019   Non-intractable vomiting 08/11/2019   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   Goals of care,  counseling/discussion 06/23/2019   Encounter for audiology evaluation 06/23/2019   Squamous cell lung cancer ([place]) 06/23/2019   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 Authorizing Provider  atorvastatin (LIPITOR) 20 MG tablet Take 20 mg by mouth daily.    [provider]  dexamethasone (DECADRON) 4 MG tablet Take 2 tablets (8 mg total) by mouth daily. Take daily x 3 days starting the day after cisplatin chemotherapy. Take with food. 07/14/20   Earlie Server, MD  donepezil (ARICEPT) 10 MG tablet Take 10 mg by mouth at bedtime.    [provider]  dorzolamide-timolol (COSOPT) 22.3-6.8 MG/ML ophthalmic solution Place 1 drop into both eyes 2 (two) times daily.     [provider]  ELIQUIS 2.5 MG TABS tablet TAKE 1 TABLET BY MOUTH TWICE A DAY 08/27/20   Earlie Server, MD  folic acid (FOLVITE) 1 MG tablet Take 1 mg by mouth daily.    [provider]  latanoprost (XALATAN) 0.005 % ophthalmic solution Place 1 drop into both eyes at bedtime.     [provider]  lidocaine-prilocaine (EMLA) cream Apply 1 application topically as needed. Apply small amount to port site approx 1-2 hours prior to appointment. 08/23/20   Earlie Server, MD  MAG64 64 MG TBEC TAKE 1 TABLET (64 MG TOTAL) BY MOUTH 2 (TWO) TIMES DAILY. 08/27/20   Earlie Server, MD  megestrol (MEGACE) 40 MG tablet TAKE 1 TABLET BY MOUTH TWICE A DAY 08/27/20   Earlie Server, MD  Multiple Vitamins-Minerals (CENTRUM ADULTS PO) Take by mouth.     [provider]  nystatin (MYCOSTATIN) 100000 UNIT/ML suspension Take 5 mLs (500,000 Units total) by mouth 4 (four) times daily. 08/09/20   Earlie Server, MD  oxyCODONE (OXY IR/ROXICODONE) 5 MG immediate release tablet Take 1 tablet (5 mg total) by mouth every 4 (four) hours as needed for severe pain. 05/08/20   Verlon Au, NP  pantoprazole (PROTONIX) 20 MG tablet Take 1 tablet (20 mg total) by mouth 2 (two) times daily. Patient not taking: No sig reported 08/02/20   Jacquelin Hawking, NP  sucralfate (CARAFATE) 1 GM/10ML suspension Take 10 mLs (1 g total) by mouth 4 (four) times daily -  with meals and at bedtime. 08/30/20   Earlie Server, MD  Thiamine HCl (VITAMIN B-1 PO) Take 100 mg by mouth daily.    [provider]    Allergies Enalapril  No family history on file.  Social History Social History   Tobacco Use   Smoking status: Former    Years: 21.00    Types: Cigarettes    Quit date: 05/11/2008    Years since quitting: 12.3   Smokeless tobacco: Never  Vaping Use   Vaping Use: Never used  Substance Use Topics   Alcohol use: Yes    Comment: 0.5 pint liquor a week   Drug use: Never    Review of Systems  Constitutional: No fever/chills Eyes: No visual changes. ENT: No sore throat. Cardiovascular: Denies chest pain. Respiratory: Denies shortness of breath. Gastrointestinal: No abdominal pain.  No nausea, no vomiting.  No diarrhea.  constipation. Genitourinary: Negative for dysuria. Musculoskeletal: Negative for back pain. Skin: Negative for rash. Neurological: Negative for headaches, focal weakness   ____________________________________________   PHYSICAL EXAM:  VITAL SIGNS: ED Triage Vitals  Enc Vitals Group     BP 09/11/20 1714 135/89     Pulse Rate 09/11/20 1714 80     Resp 09/11/20 1714 (!) 24     Temp 09/11/20 1714 98.2 F (36.8 C)     Temp Source 09/11/20 1714 Oral     SpO2 09/11/20 1714 100 %     Weight 09/11/20 1711 103 lb 9.9 oz (47 kg)      Height 09/11/20 1711 5\' 9"  (1.753 m)     Head Circumference --      Peak Flow --      Pain Score 09/11/20 1709 0     Pain Loc --      Pain Edu? --      Excl. in [place]? --    Constitutional: Alert and oriented.  Thin gentleman who is quite anxious Eyes: Conjunctivae are normal.  Head: Atraumatic. Nose: No congestion/rhinnorhea. Mouth/Throat: Mucous membranes are moist.  Oropharynx non-erythematous. Neck: No stridor.   Cardiovascular: Normal rate, regular rhythm. Grossly normal heart  sounds.  Good peripheral circulation. Respiratory: Normal respiratory effort.  No retractions. Lungs CTAB. Gastrointestinal: Soft and nontender. No distention. No abdominal bruits. No CVA tenderness. Musculoskeletal: No lower extremity tenderness nor edema.  Neurologic:  Normal speech and language. No gross focal neurologic deficits are appreciated. No gait instability. Skin:  Skin is warm, dry and intact. No rash noted.  ____________________________________________   LABS (all labs ordered are listed, but only abnormal results are displayed)  Labs Reviewed  COMPREHENSIVE METABOLIC PANEL - Abnormal; Notable for the following components:      Result Value   Glucose, Bld 117 (*)    Total Protein 6.4 (*)    Albumin 3.3 (*)    All other components within normal limits  CBC WITH DIFFERENTIAL/PLATELET - Abnormal; Notable for the following components:   RBC 3.68 (*)    Hemoglobin 11.2 (*)    HCT 33.3 (*)    RDW 17.6 (*)    Lymphs Abs 0.6 (*)    Abs Immature Granulocytes 0.09 (*)    All other components within normal limits   ____________________________________________  EKG   ____________________________________________  RADIOLOGY Gertha Calkin, personally viewed and evaluated these images (plain radiographs) as part of my medical decision making, as well as reviewing the written report by the radiologist.  ED MD interpretation:    Official radiology report(s): No results  found.  ____________________________________________   PROCEDURES  Procedure(s) performed (including Critical Care):  Procedures   ____________________________________________   INITIAL IMPRESSION / ASSESSMENT AND PLAN / ED COURSE   Patient has been stable during his visit here.  I will let him go.  His wife will bring him back if he has any further problems and they will follow-up with cancer center tomorrow morning.          ____________________________________________   FINAL CLINICAL IMPRESSION(S) / ED DIAGNOSES  Final diagnoses:  Blood in stool  Anxiety     ED Discharge Orders     None        Note:  This document was prepared using Dragon voice recognition software and may include unintentional dictation errors.    Nena Polio, MD 09/11/20 2059

## 2020-09-11 NOTE — ED Notes (Signed)
Primary nurse reports continued hold on ativan. Pt currently in NAD. Denies pain and anxiety.

## 2020-09-11 NOTE — Discharge Instructions (Addendum)
Please return for any further problems.  This includes a lot of rectal bleeding or lightheadedness or feeling sicker.  Please see Rulon Abide nurse practitioner in the cancer center [phone] tomorrow for recheck.

## 2020-09-12 ENCOUNTER — Inpatient Hospital Stay (HOSPITAL_BASED_OUTPATIENT_CLINIC_OR_DEPARTMENT_OTHER): Payer: Medicare Other | Admitting: Oncology

## 2020-09-12 VITALS — BP 102/72 | HR 85 | Temp 98.6°F | Resp 18

## 2020-09-12 DIAGNOSIS — K625 Hemorrhage of anus and rectum: Secondary | ICD-10-CM

## 2020-09-12 DIAGNOSIS — C329 Malignant neoplasm of larynx, unspecified: Secondary | ICD-10-CM

## 2020-09-12 DIAGNOSIS — C109 Malignant neoplasm of oropharynx, unspecified: Secondary | ICD-10-CM | POA: Diagnosis not present

## 2020-09-12 DIAGNOSIS — Z510 Encounter for antineoplastic radiation therapy: Secondary | ICD-10-CM | POA: Diagnosis not present

## 2020-09-12 NOTE — Progress Notes (Signed)
Symptom Management Consult note [facility]  [phone] [phone]  Patient Care Team: Casilda Carls, MD as PCP - General (Internal Medicine) Earlie Server, MD as Consulting Physician (Oncology)   Name of the patient: Gregory Burton  [id number]  05-09-1940   Date of visit: 09/12/2020   Diagnosis-head and neck cancer  Chief complaint/ Reason for visit-anxiety/rectal bleeding  Heme/Onc history: Gregory Burton is an 80 year old male with past medical history significant for dementia, alcohol abuse, former smoker, weight loss with recent diagnosis of oropharynx/Larynex cancer.  Patient recently moved from [place] to [place] and noticed a knot on the right side of his neck.  He was prescribed antibiotics without improvement.  Had imaging which revealed a mass at the base of his tongue.  He also was noted to have stage I lung cancer status post SBRT which she completed in December 2021.  Patient was seen by radiation oncology Dr. Donella Burton who offered salvage radiation to his Larynex as well as his neck adenopathy along with low-dose chemotherapy.    He is status post 6 cycles of cisplatin last on 09/06/2020 along with radiation.  He tolerated treatment well except for odynophagia.   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  Interval history- Patient presented to the emergency room yesterday 09/11/2020 for anxiety and rectal bleeding.  He apparently normally has soft/normal bowel movements and has no trouble with constipation.  Reports significantly straining yesterday with his bowel movement.  He was instructed to try magnesium citrate but before he had the chance he saw the blood.  Reports pain with his bowel movement which resolved.  Lab work showed stable blood counts.  Rectal exam was negative but he was Hemoccult positive.  He was discharged home and instructed to follow-up with the cancer center.  Since discharge  from the hospital he had his wife deny any additional rectal bleeding.  Reports a second stool while in the emergency room with small streaks of blood noted mostly on the toilet paper.  Denies any blood clots and blood is light in color.  He has not had a bowel movement since discharge.  ECOG FS:1 - Symptomatic but completely ambulatory  Review of systems- Review of Systems  Constitutional: Negative.  Negative for chills, fever, malaise/fatigue and weight loss.  HENT:  Negative for congestion, ear pain and tinnitus.   Eyes: Negative.  Negative for blurred vision and double vision.  Respiratory: Negative.  Negative for cough, sputum production and shortness of breath.   Cardiovascular: Negative.  Negative for chest pain, palpitations and leg swelling.  Gastrointestinal:  Positive for blood in stool. Negative for abdominal pain, constipation, diarrhea, nausea and vomiting.  Genitourinary:  Negative for dysuria, frequency and urgency.  Musculoskeletal:  Negative for back pain and falls.  Skin: Negative.  Negative for rash.  Neurological: Negative.  Negative for weakness and headaches.  Endo/Heme/Allergies: Negative.  Does not bruise/bleed easily.  Psychiatric/Behavioral: Negative.  Negative for depression. The patient is not nervous/anxious and does not have insomnia.     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     Past Surgical History:  Procedure Laterality Date   BRAIN SURGERY  2012   HERNIA REPAIR     PORTA CATH INSERTION N/A 07/27/2019   Procedure: PORTA CATH  INSERTION;  Surgeon: Katha Cabal, MD;  Location: [facility];  Service: Cardiovascular;  Laterality: N/A;    Social History   Socioeconomic History   Marital status: Married    Spouse name: Not on file   Number of children: Not on file   Years of education: Not on file   Highest education level: Not on file  Occupational History   Not on file  Tobacco Use   Smoking status: Former    Years: 21.00    Types: Cigarettes    Quit date: 05/11/2008    Years since quitting: 12.3   Smokeless tobacco: Never  Vaping Use   Vaping Use: Never used  Substance and Sexual Activity   Alcohol use: Yes    Comment: 0.5 pint liquor a week   Drug use: Never   Sexual activity: Not Currently  Other Topics Concern   Not on file  Social History Narrative   Lives at home with wife. Wife states he has some dementia but is able to sign his own consent.   Social Determinants of Health   Financial Resource Strain: Not on file  Food Insecurity: Not on file  Transportation Needs: Not on file  Physical Activity: Not on file  Stress: Not on file  Social Connections: Not on file  Intimate Partner Violence: Not on file    No family history on file.   Current Outpatient Medications:    atorvastatin (LIPITOR) 20 MG tablet, Take 20 mg by mouth daily., Disp: , Rfl:    dexamethasone (DECADRON) 4 MG tablet, Take 2 tablets (8 mg total) by mouth daily. Take daily x 3 days starting the day after cisplatin chemotherapy. Take with food., Disp: 30 tablet, Rfl: 1   donepezil (ARICEPT) 10 MG tablet, Take 10 mg by mouth at bedtime., Disp: , Rfl:    dorzolamide-timolol (COSOPT) 22.3-6.8 MG/ML ophthalmic solution, Place 1 drop into both eyes 2 (two) times daily. , Disp: , Rfl:    ELIQUIS 2.5 MG TABS tablet, TAKE 1 TABLET BY MOUTH TWICE A DAY, Disp: 60 tablet, Rfl: 1   folic acid (FOLVITE) 1 MG tablet, Take 1 mg by mouth daily., Disp: , Rfl:    latanoprost (XALATAN) 0.005 % ophthalmic solution, Place 1  drop into both eyes at bedtime. , Disp: , Rfl:    lidocaine-prilocaine (EMLA) cream, Apply 1 application topically as needed. Apply small amount to port site approx 1-2 hours prior to appointment., Disp: 30 g, Rfl: 1   MAG64 64 MG TBEC, TAKE 1 TABLET (64 MG TOTAL) BY MOUTH 2 (TWO) TIMES DAILY., Disp: 180 tablet, Rfl: 0   megestrol (MEGACE) 40 MG tablet, TAKE 1 TABLET BY MOUTH TWICE A DAY, Disp: 60 tablet, Rfl: 0   Multiple Vitamins-Minerals (CENTRUM ADULTS PO), Take by mouth., Disp: , Rfl:    nystatin (MYCOSTATIN) 100000 UNIT/ML suspension, Take 5 mLs (500,000 Units total) by mouth 4 (four) times daily., Disp: 473 mL, Rfl: 1   oxyCODONE (OXY IR/ROXICODONE) 5 MG immediate release tablet, Take 1 tablet (5 mg total) by mouth every 4 (four) hours as needed for severe pain., Disp: 90 tablet, Rfl: 0  pantoprazole (PROTONIX) 20 MG tablet, Take 1 tablet (20 mg total) by mouth 2 (two) times daily. (Patient not taking: No sig reported), Disp: 90 tablet, Rfl: 0   sucralfate (CARAFATE) 1 GM/10ML suspension, Take 10 mLs (1 g total) by mouth 4 (four) times daily -  with meals and at bedtime., Disp: 420 mL, Rfl: 2   Thiamine HCl (VITAMIN B-1 PO), Take 100 mg by mouth daily., Disp: , Rfl:  No current facility-administered medications for this visit.  Facility-Administered Medications Ordered in Other Visits:    sodium chloride flush (NS) 0.9 % injection 10 mL, 10 mL, Intravenous, PRN, Earlie Server, MD, 10 mL at 03/01/20 4010  Physical exam:  Vitals:   09/12/20 1038  BP: 102/72  Pulse: 85  Resp: 18  Temp: 98.6 F (37 C)  TempSrc: Tympanic  SpO2: 99%   Physical Exam Constitutional:      Appearance: Normal appearance.  HENT:     Head: Normocephalic and atraumatic.  Eyes:     Pupils: Pupils are equal, round, and reactive to light.  Cardiovascular:     Rate and Rhythm: Normal rate and regular rhythm.     Heart sounds: Normal heart sounds. No murmur heard. Pulmonary:     Effort: Pulmonary effort is  normal.     Breath sounds: Normal breath sounds. No wheezing.  Abdominal:     General: Bowel sounds are normal. There is no distension.     Palpations: Abdomen is soft.     Tenderness: There is no abdominal tenderness.  Musculoskeletal:        General: Normal range of motion.     Cervical back: Normal range of motion.  Skin:    General: Skin is warm and dry.     Findings: No rash.  Neurological:     Mental Status: He is alert and oriented to person, place, and time.  Psychiatric:        Judgment: Judgment normal.     CMP Latest Ref Rng & Units 09/11/2020  Glucose 70 - 99 mg/dL 117(H)  BUN 8 - 23 mg/dL 21  Creatinine 0.61 - 1.24 mg/dL 0.79  Sodium 135 - 145 mmol/L 138  Potassium 3.5 - 5.1 mmol/L 3.9  Chloride 98 - 111 mmol/L 100  CO2 22 - 32 mmol/L 27  Calcium 8.9 - 10.3 mg/dL 9.1  Total Protein 6.5 - 8.1 g/dL 6.4(L)  Total Bilirubin 0.3 - 1.2 mg/dL 0.9  Alkaline Phos 38 - 126 U/L 60  AST 15 - 41 U/L 30  ALT 0 - 44 U/L 30   CBC Latest Ref Rng & Units 09/11/2020  WBC 4.0 - 10.5 K/uL 9.0  Hemoglobin 13.0 - 17.0 g/dL 11.2(L)  Hematocrit 39.0 - 52.0 % 33.3(L)  Platelets 150 - 400 K/uL 252    No images are attached to the encounter.  No results found.   Assessment and plan- Patient is a 80 y.o. male who presents for follow-up for rectal bleeding.  Head and neck cancer stage IV: He is status post 6 cycles of cisplatin with concurrent radiation.   He completed treatment on 09/06/2020. He is scheduled to begin [facility] in August 2022.  Rectal bleeding: Secondary to constipation and straining exacerbated by anticoagulation. Reports 2 episode of rectal bleeding; one occurrence at home with significant straining and one occurrence in the emergency room. He was found to be Hemoccult positive in the ED. Rectal exam was negative. He is on Eliquis for a right jugular vein thrombus. Lab work  from the emergency room shows a hemoglobin of 11.2.  Vital signs are stable. Recommend  he keep a close eye on his bowel movements and to let us know if bleeding continues versus worsens.  Constipation: Patient states he normally has soft bowel movements. I instructed him to take either MiraLAX or Senokot if he feels constipated in the future to avoid straining. Recommended plenty of fluids.  Anxiety: Spoke with patient's son who has questions regarding the use of CBD oil. Will touch base with [name] with her recommendations.  Disposition: Close surveillance of bowel movements. RTC as scheduled on 10/04/2020 for follow-up with labs and to possibly initiate Keytruda.   Visit Diagnosis 1. Larynx cancer ([place])   2. 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[place])   3. Rectal bleeding     Patient expressed understanding and was in agreement with this plan. He also understands that He can call clinic at any time with any questions, concerns, or complaints.   I spent 45 minutes dedicated to the care of this patient (face-to-face and non-face-to-face) on the date of the encounter to include what is described in the assessment and plan.  Thank you for allowing me to participate in the care of this very pleasant patient.    Jacquelin Hawking, NP [place] at [facility] Cell - [phone] Pager- [phone] 09/12/2020 11:56 AM

## 2020-09-13 ENCOUNTER — Other Ambulatory Visit: Payer: Self-pay | Admitting: Oncology

## 2020-09-13 DIAGNOSIS — C329 Malignant neoplasm of larynx, unspecified: Secondary | ICD-10-CM

## 2020-09-18 ENCOUNTER — Telehealth: Payer: Self-pay

## 2020-09-18 ENCOUNTER — Other Ambulatory Visit: Payer: Self-pay

## 2020-09-18 MED ORDER — MEGESTROL ACETATE 40 MG PO TABS
40.0000 mg | ORAL_TABLET | Freq: Two times a day (BID) | ORAL | 0 refills | Status: DC
Start: 2020-09-18 — End: 2020-10-23

## 2020-09-18 MED ORDER — SUCRALFATE 1 GM/10ML PO SUSP: 1.0000 g | Freq: Three times a day (TID) | ORAL | 2 refills | Status: DC

## 2020-09-18 NOTE — Telephone Encounter (Signed)
Received call from patient's wife, Gregory Burton, stating that patient had constipation last week and started taking Miralax. She states that now he is having loose stools, advised her to hold Miralax for a day or 2 until bowels regulate and then maybe use it every other day after that to prevent constipation. She voiced understanding.   She also reports that pt has small bump on his forehead and that patient states that it is not painful , burning or stinging. She states that ice was applied and swelling has decreased. Advised her to monitor and to notify us if it gets worse or if it becomes painful.   She is also requesting refill on Carafate and Megace. Will send refill to pharmacy.

## 2020-09-23 ENCOUNTER — Other Ambulatory Visit: Payer: Self-pay | Admitting: Oncology

## 2020-09-25 ENCOUNTER — Encounter: Payer: Self-pay | Admitting: Oncology

## 2020-10-04 ENCOUNTER — Inpatient Hospital Stay: Payer: Medicare Other | Attending: Oncology

## 2020-10-04 ENCOUNTER — Inpatient Hospital Stay: Payer: Medicare Other

## 2020-10-04 ENCOUNTER — Encounter: Payer: Self-pay | Admitting: Oncology

## 2020-10-04 ENCOUNTER — Other Ambulatory Visit: Payer: Self-pay

## 2020-10-04 ENCOUNTER — Inpatient Hospital Stay (HOSPITAL_BASED_OUTPATIENT_CLINIC_OR_DEPARTMENT_OTHER): Payer: Medicare Other | Admitting: Oncology

## 2020-10-04 VITALS — BP 140/74 | HR 91 | Temp 98.4°F | Resp 18 | Wt 104.8 lb

## 2020-10-04 DIAGNOSIS — R634 Abnormal weight loss: Secondary | ICD-10-CM | POA: Diagnosis not present

## 2020-10-04 DIAGNOSIS — Z79899 Other long term (current) drug therapy: Secondary | ICD-10-CM | POA: Insufficient documentation

## 2020-10-04 DIAGNOSIS — Z5112 Encounter for antineoplastic immunotherapy: Secondary | ICD-10-CM

## 2020-10-04 DIAGNOSIS — R630 Anorexia: Secondary | ICD-10-CM | POA: Insufficient documentation

## 2020-10-04 DIAGNOSIS — F1027 Alcohol dependence with alcohol-induced persisting dementia: Secondary | ICD-10-CM | POA: Diagnosis not present

## 2020-10-04 DIAGNOSIS — C3412 Malignant neoplasm of upper lobe, left bronchus or lung: Secondary | ICD-10-CM | POA: Diagnosis not present

## 2020-10-04 DIAGNOSIS — Z87891 Personal history of nicotine dependence: Secondary | ICD-10-CM | POA: Insufficient documentation

## 2020-10-04 DIAGNOSIS — Z1329 Encounter for screening for other suspected endocrine disorder: Secondary | ICD-10-CM

## 2020-10-04 DIAGNOSIS — C3492 Malignant neoplasm of unspecified part of left bronchus or lung: Secondary | ICD-10-CM | POA: Diagnosis not present

## 2020-10-04 DIAGNOSIS — C7951 Secondary malignant neoplasm of bone: Secondary | ICD-10-CM | POA: Insufficient documentation

## 2020-10-04 DIAGNOSIS — F1020 Alcohol dependence, uncomplicated: Secondary | ICD-10-CM | POA: Insufficient documentation

## 2020-10-04 DIAGNOSIS — C329 Malignant neoplasm of larynx, unspecified: Secondary | ICD-10-CM

## 2020-10-04 DIAGNOSIS — K0889 Other specified disorders of teeth and supporting structures: Secondary | ICD-10-CM | POA: Insufficient documentation

## 2020-10-04 DIAGNOSIS — I82C11 Acute embolism and thrombosis of right internal jugular vein: Secondary | ICD-10-CM | POA: Diagnosis not present

## 2020-10-04 DIAGNOSIS — Z7901 Long term (current) use of anticoagulants: Secondary | ICD-10-CM | POA: Insufficient documentation

## 2020-10-04 DIAGNOSIS — C01 Malignant neoplasm of base of tongue: Secondary | ICD-10-CM | POA: Insufficient documentation

## 2020-10-04 DIAGNOSIS — Z8042 Family history of malignant neoplasm of prostate: Secondary | ICD-10-CM | POA: Diagnosis not present

## 2020-10-04 DIAGNOSIS — K068 Other specified disorders of gingiva and edentulous alveolar ridge: Secondary | ICD-10-CM | POA: Insufficient documentation

## 2020-10-04 DIAGNOSIS — C109 Malignant neoplasm of oropharynx, unspecified: Secondary | ICD-10-CM | POA: Diagnosis present

## 2020-10-04 LAB — COMPREHENSIVE METABOLIC PANEL
ALT: 14 U/L (ref 0–44)
AST: 19 U/L (ref 15–41)
Albumin: 3.5 g/dL (ref 3.5–5.0)
Alkaline Phosphatase: 52 U/L (ref 38–126)
Anion gap: 6 (ref 5–15)
BUN: 14 mg/dL (ref 8–23)
CO2: 27 mmol/L (ref 22–32)
Calcium: 9.1 mg/dL (ref 8.9–10.3)
Chloride: 106 mmol/L (ref 98–111)
Creatinine, Ser: 0.77 mg/dL (ref 0.61–1.24)
GFR, Estimated: >60 mL/min (ref >60)
Glucose, Bld: 121 mg/dL — ABNORMAL HIGH (ref 70–99)
Potassium: 3.6 mmol/L (ref 3.5–5.1)
Sodium: 139 mmol/L (ref 135–145)
Total Bilirubin: 0.3 mg/dL (ref 0.3–1.2)
Total Protein: 6.4 g/dL — ABNORMAL LOW (ref 6.5–8.1)

## 2020-10-04 LAB — CBC WITH DIFFERENTIAL/PLATELET
Abs Immature Granulocytes: 0.02 10*3/uL (ref 0.00–0.07)
Basophils Absolute: 0 10*3/uL (ref 0.0–0.1)
Basophils Relative: 0 %
Eosinophils Absolute: 0 10*3/uL (ref 0.0–0.5)
Eosinophils Relative: 1 %
HCT: 28.2 % — ABNORMAL LOW (ref 39.0–52.0)
Hemoglobin: 9.2 g/dL — ABNORMAL LOW (ref 13.0–17.0)
Immature Granulocytes: 1 %
Lymphocytes Relative: 16 %
Lymphs Abs: 0.7 10*3/uL (ref 0.7–4.0)
MCH: 31.9 pg (ref 26.0–34.0)
MCHC: 32.6 g/dL (ref 30.0–36.0)
MCV: 97.9 fL (ref 80.0–100.0)
Monocytes Absolute: 0.8 10*3/uL (ref 0.1–1.0)
Monocytes Relative: 19 %
Neutro Abs: 2.5 10*3/uL (ref 1.7–7.7)
Neutrophils Relative %: 63 %
Platelets: 214 10*3/uL (ref 150–400)
RBC: 2.88 MIL/uL — ABNORMAL LOW (ref 4.22–5.81)
RDW: 19 % — ABNORMAL HIGH (ref 11.5–15.5)
WBC: 4 10*3/uL (ref 4.0–10.5)
nRBC: 0 % (ref 0.0–0.2)

## 2020-10-04 LAB — T4, FREE: Free T4: 1.05 ng/dL (ref 0.61–1.12)

## 2020-10-04 LAB — MAGNESIUM: Magnesium: 1.6 mg/dL — ABNORMAL LOW (ref 1.7–2.4)

## 2020-10-04 MED ORDER — HEPARIN SOD (PORK) LOCK FLUSH 100 UNIT/ML IV SOLN
INTRAVENOUS | Status: AC
  Filled 2020-10-04: qty 5

## 2020-10-04 MED ORDER — MAGNESIUM SULFATE 2 GM/50ML IV SOLN
2.0000 g | Freq: Once | INTRAVENOUS | Status: AC
  Administered 2020-10-04: 2 g via INTRAVENOUS
  Filled 2020-10-04: qty 50

## 2020-10-04 MED ORDER — SODIUM CHLORIDE 0.9 % IV SOLN
Freq: Once | INTRAVENOUS | Status: AC
  Filled 2020-10-04: qty 250

## 2020-10-04 MED ORDER — HEPARIN SOD (PORK) LOCK FLUSH 100 UNIT/ML IV SOLN
500.0000 [IU] | Freq: Once | INTRAVENOUS | Status: AC | PRN
  Administered 2020-10-04: 500 [IU]
  Filled 2020-10-04: qty 5

## 2020-10-04 MED ORDER — SODIUM CHLORIDE 0.9 % IV SOLN
200.0000 mg | Freq: Once | INTRAVENOUS | Status: AC
  Administered 2020-10-04: 200 mg via INTRAVENOUS
  Filled 2020-10-04: qty 8

## 2020-10-04 NOTE — Progress Notes (Signed)
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  Patient completed concurrent chemotherapy and radiation to right vocal cord.  Patient starting keytruda today.   Met with wife as patient with dementia. Wife reports that patient has maintained a good appetite.  Drinking 3 ensure plus daily typically with meals.  Breakfast has been eggs, toast with jelly, juice and ensure.  Lunch yesterday was chicken, cabbage, mashed potatoes and ate leftovers for dinner.  Had 2 small cups of ice cream as well yesterday.  Wife reports that patient is using salt water rinse as gums are tender.    Medications: megace  Labs: reviewed  Anthropometrics:   Weight 104 lb 12.8 oz stable  104 lb on 7/13 104 lb 11.2 oz on 7/6 107 lb on 6/8 109 lb on 6/1 112 lb on 4/1 118 lb on 2/8   NUTRITION DIAGNOSIS: Inadequate oral intake stable   INTERVENTION:  Complimentary case of ensure plus given to patient today. Continue 3 ensure plus shakes per day Continue high calorie, high protein foods for weight maintenance    MONITORING, EVALUATION, GOAL: weight trends, intake   NEXT VISIT: Wednesday, August 31    B. , RD, LDN Registered Dietitian [phone] (mobile)   

## 2020-10-04 NOTE — Progress Notes (Signed)
Hematology/Oncology progress note [facility] Telephone:([phone] [phone]   Patient Care Team: Casilda Carls, MD as PCP - General (Internal Medicine) Earlie Server, [name], [name]  HISTORY OF PRESENTING ILLNESS:  Gregory Burton is a  80 y.o.  male with PMH listed below was seen in consultation at the request of  Casilda Carls, MD  for evaluation of lymph node.  Patient moved from [place] to [place] for about 3 weeks. He has noticed a neck knot on the right side of his neck. The knot is sore and makes him to cough and he feels it when he swallows. No swallowing difficulty.  Patient was accompanied by his wife. She reports that patient's previous PCP has done neck sonogram for evaluation.  Patient also had right lower molar tooth extraction done a few weeks before he noticed  He also took a course of antibiotics.  Due to his tooth pain, his appetite has decreased and he has lost weight loss, about 20 pounds, he can not specify the time frame.   Alcoholism History of prostate cancer. S/p Radiation/seed, he follows up with [facility].   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  PD-L1-IHC 40%, no targetable mutations. # 09/07/2019 chemotherapy [cisplatin] and radiation.   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  Interval decrease of the  posterior left upper lobe pleural-based nodule with interval decrease in size of the tiny adjacent satellite nodule.  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  No recurrent tumor in right piriform sinus.  Right lateral pharyngeal lymph node is stable.  No new or recurrent adenopathy.Suspect metastasis.  I recommend patient to obtain PET scan restaging.  I discussed about the plan of re-biopsy of either the paraspinal soft tissue mass versus other more feasible hypermetabolic sites detected on PET scan. Patient was seen by Dr. Kennyth Lose and opted to proceed with palliative radiation as soon as possible.  PET scan cannot be arranged prior to the radiation so the PET scan was canceled. 04/20/2020 -04/25/2020, palliative radiation to thoracic spine  05/10/2020, MRI thoracic spine was obtained for worsening of back pain and left shoulder pain.  Images showed metastatic disease throughout almost the entire T2 with and associated mild superior endplate compression fracture.  Abnormal signal in the anterior, inferior endplate of T1, superior aspect of the T3.  Small metastatic deposit in T12 is also noted. Case was also discussed on multidisciplinary tumor board on 05/25/2020 05/22/2020, PET scan showed new hypermetabolic lytic bone metastasis in the left scapular near the glenoid and in the T2 vertebral body.  Persistent hypermetabolic right lateral retropharyngeal and right level 3 neck lymph node metastasis.  Peripheral left upper lobe pulmonary nodule is mildly decreased with new surrounding mild platelike postradiation changes..  No residual recurrent neoplasm at the site of vomiting in the right piriform sinus  06/07/2020 -06/09/2020 patient underwent palliative radiation to left  scapular April 2022 seen by [name] and flexible laryngoscope examination showed new lesion at the right false vocal cord.  Case was discussed on tumor board on 06/22/2020. [name] recommend additional CT  soft tissue neck with contrast for additional evaluation. 06/29/2020 CT images were reviewed and discussed with patient. There is new malignant lymph node in the right mid neck at the level of the hyoid, this compressing the right jugular vein with thrombus in the vein above and below the lymph node.  Cystic retropharyngeal lymph node on the right is stable.  Progressive soft tissue thickening in the larynx bilaterally suspicious for tumor.  Metastatic disease at the T2 is unchanged.  Metastatic deposit left scapula there is now a pathological fracture of the scapular. CT scan findings were discussed with ENT [name] via secure chat.  [name] feels surgery is not feasible.  The larynx lesion currently does not compromise his airway however further progression may in the future  07/10/2020 Seen by Radonc [name] who would offer salvage radiation to his larynx as well as neck adenopathy. Would like low dose chemotherapy as sensitizer.    Patient was also referred to establish care with Dr. Georgiann Cocker at [facility] for kyphoplasty.  Patient's wife reports that they were never contacted and she called Dr. Georgiann Cocker office with no success.  # NGS.  PD-L1-IHC 40%, no targetable mutations # 07/25/20- 07/27/2020, 08/01/20- 09/06/2020  concurrent chemoradiation [low-dose weekly cisplatin $RemoveBeforeDE'30mg'ZNjJxOFDfvOGjav$ /m2 ] to the larynx   INTERVAL HISTORY Gregory Burton is a 80 y.o. male who has above history reviewed by me today presents for follow-up of head and neck cancer and lung cancer Patient was accompanied by wife. 09/15/2020, patient was seen by ENT [name].  Laryngoscope examination reviewed clear larynx, no lesion was discovered. Patient is eating well.  Weight has been stable.  Review of Systems  Constitutional:  Positive for fatigue. Negative for appetite change, chills, fever and unexpected weight change.  HENT:   Positive for hearing loss. Negative for voice change.   Eyes:  Negative for eye problems and icterus.   Respiratory:  Negative for chest tightness, cough and shortness of breath.   Cardiovascular:  Negative for chest pain and leg swelling.  Gastrointestinal:  Negative for abdominal distention, abdominal pain, blood in stool and nausea.  Endocrine: Negative for hot flashes.  Genitourinary:  Negative for difficulty urinating, dysuria and frequency.   Musculoskeletal:  Positive for arthralgias.  Skin:  Negative for itching and rash.  Neurological:  Negative for extremity weakness, light-headedness and numbness.  Hematological:  Negative for adenopathy. Does not bruise/bleed easily.  Psychiatric/Behavioral:  Negative for confusion.        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   Prostate CA ([place]) 2008   Squamous cell lung cancer ([place]) 06/23/2019    SURGICAL HISTORY: Past Surgical History:  Procedure Laterality Date   BRAIN SURGERY  2012   HERNIA REPAIR     PORTA CATH INSERTION N/A 07/27/2019   Procedure: PORTA CATH INSERTION;  Surgeon: Katha Cabal, MD;  Location: [facility];  Service: Cardiovascular;  Laterality: N/A;    SOCIAL HISTORY: Social History   Socioeconomic  History   Marital status: Married    Spouse name: Not on file   Number of children: Not on file   Years of education: Not on file   Highest education level: Not on file  Occupational History   Not on file  Tobacco Use   Smoking status: Former    Years: 21.00    Types: Cigarettes    Quit date: 05/11/2008    Years since quitting: 12.4   Smokeless tobacco: Never  Vaping Use   Vaping Use: Never used  Substance and Sexual Activity   Alcohol use: Yes    Comment: 0.5 pint liquor a week   Drug use: Never   Sexual activity: Not Currently  Other  Topics Concern   Not on file  Social History Narrative   Lives at home with wife. Wife states he has some dementia but is able to sign his own consent.   Social Determinants of Health   Financial Resource Strain: Not on file  Food Insecurity: Not on file  Transportation Needs: Not on file  Physical Activity: Not on file  Stress: Not on file  Social Connections: Not on file  Intimate Partner Violence: Not on file    FAMILY HISTORY: No family history on file.  ALLERGIES:  is allergic to enalapril.  MEDICATIONS:  Current Outpatient Medications  Medication Sig Dispense Refill   atorvastatin (LIPITOR) 20 MG tablet Take 20 mg by mouth daily.     donepezil (ARICEPT) 10 MG tablet Take 10 mg by mouth at bedtime.     dorzolamide-timolol (COSOPT) 22.3-6.8 MG/ML ophthalmic solution Place 1 drop into both eyes 2 (two) times daily.      ELIQUIS 2.5 MG TABS tablet TAKE 1 TABLET BY MOUTH TWICE A DAY 60 tablet 1   folic acid (FOLVITE) 1 MG tablet Take 1 mg by mouth daily.     latanoprost (XALATAN) 0.005 % ophthalmic solution Place 1 drop into both eyes at bedtime.      lidocaine-prilocaine (EMLA) cream Apply 1 application topically as needed. Apply small amount to port site approx 1-2 hours prior to appointment. 30 g 1   MAG64 64 MG TBEC TAKE 1 TABLET (64 MG TOTAL) BY MOUTH 2 (TWO) TIMES DAILY. 180 tablet 0   megestrol (MEGACE) 40 MG tablet Take 1 tablet (40 mg total) by mouth 2 (two) times daily. 60 tablet 0   Multiple Vitamins-Minerals (CENTRUM ADULTS PO) Take by mouth.     Thiamine HCl (VITAMIN B-1 PO) Take 100 mg by mouth daily.     oxyCODONE (OXY IR/ROXICODONE) 5 MG immediate release tablet Take 1 tablet (5 mg total) by mouth every 4 (four) hours as needed for severe pain. (Patient not taking: Reported on 10/04/2020) 90 tablet 0   No current facility-administered medications for this visit.   Facility-Administered Medications Ordered in Other Visits  Medication Dose Route Frequency  Provider Last Rate Last Admin   sodium chloride flush (NS) 0.9 % injection 10 mL  10 mL Intravenous PRN Rickard Patience, MD   10 mL at 03/01/20 0812     PHYSICAL EXAMINATION: ECOG PERFORMANCE STATUS: 1 - Symptomatic but completely ambulatory Vitals:   10/04/20 0836  BP: 140/74  Pulse: 91  Resp: 18  Temp: 98.4 F (36.9 C)  SpO2: 97%   Filed Weights   10/04/20 0836  Weight: 104 lb 12.8 oz (47.5 kg)    Physical Exam Constitutional:      General: He is not in acute distress. HENT:  Head: Normocephalic and atraumatic.  Eyes:     General: No scleral icterus. Cardiovascular:     Rate and Rhythm: Normal rate and regular rhythm.     Heart sounds: Normal heart sounds.  Pulmonary:     Effort: Pulmonary effort is normal. No respiratory distress.     Breath sounds: No wheezing.  Abdominal:     General: Bowel sounds are normal. There is no distension.     Palpations: Abdomen is soft.  Musculoskeletal:        General: No deformity.     Cervical back: Normal range of motion and neck supple.     Comments: Left shoulder pain has improved  Lymphadenopathy:     Cervical: No cervical adenopathy.  Skin:    General: Skin is warm and dry.     Findings: No erythema or rash.  Neurological:     Mental Status: He is alert. Mental status is at baseline.     Cranial Nerves: No cranial nerve deficit.     Coordination: Coordination normal.    LABORATORY DATA:  I have reviewed the data as listed Lab Results  Component Value Date   WBC 4.0 10/04/2020   HGB 9.2 (L) 10/04/2020   HCT 28.2 (L) 10/04/2020   MCV 97.9 10/04/2020   PLT 214 10/04/2020   Recent Labs    11/15/19 0818 12/31/19 0927 09/06/20 0902 09/11/20 1715 10/04/20 0754  NA 141   < > 136 138 139  K 4.1   < > 3.4* 3.9 3.6  CL 109   < > 103 100 106  CO2 24   < > $R'25 27 27  'QG$ GLUCOSE 98   < > 112* 117* 121*  BUN 19   < > $R'21 21 14  'FT$ CREATININE 0.88   < > 0.70 0.79 0.77  CALCIUM 9.2   < > 8.6* 9.1 9.1  GFRNONAA >60   < > >60 >60  >60  GFRAA >60  --   --   --   --   PROT 7.0   < > 6.1* 6.4* 6.4*  ALBUMIN 3.9   < > 3.2* 3.3* 3.5  AST 17   < > $R'21 30 19  'kT$ ALT 19   < > $R'28 30 14  'YC$ ALKPHOS 41   < > 63 60 52  BILITOT 1.0   < > 0.4 0.9 0.3   < > = values in this interval not displayed.    Iron/TIBC/Ferritin/ %Sat No results found for: IRON, TIBC, FERRITIN, IRONPCTSAT    RADIOGRAPHIC STUDIES: I have personally reviewed the radiological images as listed and agreed with the findings in the report. No results found.       ASSESSMENT & PLAN:  1. 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 Squamous cell carcinoma of left lung ([place])   4. 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 Weight loss   6. Encounter for antineoplastic immunotherapy   7. Hypomagnesemia   8. Thyroid disorder screen   Cancer Staging Larynx cancer [facility]) Staging form: Larynx - Glottis, AJCC 8th Edition - Clinical: Stage IVC (cT1, cNX, cM1) - Signed by Earlie Server, MD on 07/26/2020  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 I recommend single agent Keytruda for maintenance treatment.  CPS 40% on his oropharyngeal SCC  specimen. I discussed about the mechanism of action and rationale of using immunotherapy.  The goal is palliative intent. I discussed the potential side effects of immunotherapy including but not limited to diarrhea; skin rash; respiratory failure, kidney failure, mental status change, elevated LFTs/liver failure,endocrine abnormalities, acute deterioration  and even death,etc. Patient and wife and his son voice understanding and agree with proceeding with treatment. Reviewed and discussed with patient. Check baseline TSH and free T4.  #Radiation induced esophagitis, resolved.  He may stop pantoprazole, Carafate. #Hypomagnesia, continue oral Slow-Mag, increase to 1 tablet twice daily.  Patient will receive IV magnesium 2 g x 1 today.  #Left upper lobe squamous cell carcinoma, stage I Finished SBRT on 02/07/2020.  Monitor disease status.  #Right jugular vein thrombus, continue Eliquis 2.5 mg twice daily.  Continue.  We will reimage 8 to 10 weeks after radiation.  #Weight loss, weight has been stable.  Continue nutrition supplementation.    Weight has been all questions were answered. The patient knows to call the clinic with any problems questions or concerns.   Return of visit: lab/MD/Keytruda in 3 weeks  Earlie Server, MD, PhD Hematology Oncology [facility] Pager- [phone] 10/04/2020

## 2020-10-04 NOTE — Patient Instructions (Signed)
[facility]COL[facility]  Discharge Instructions: Thank you for choosing [facility] to provide your oncology and hematology care.  If you have a lab appointment with the [facility], please go directly to the [facility] and check in at the registration area.  Wear comfortable clothing and clothing appropriate for easy access to any Portacath or PICC line.   We strive to give you quality time with your provider. You may need to reschedule your appointment if you arrive late (15 or more minutes).  Arriving late affects you and other patients whose appointments are after yours.  Also, if you miss three or more appointments without notifying the office, you may be dismissed from the clinic at the provider's discretion.      For prescription refill requests, have your pharmacy contact our office and allow 72 hours for refills to be completed.    Today you received the following chemotherapy and/or immunotherapy agents keytruda  Pembrolizumab injection What is this medication? PEMBROLIZUMAB (pem broe liz ue mab) is a monoclonal antibody. It is used totreat certain types of cancer. This medicine may be used for other purposes; ask your health care provider orpharmacist if you have questions. COMMON BRAND NAME(S): Keytruda What should I tell my care team before I take this medication? They need to know if you have any of these conditions: autoimmune diseases like Crohn's disease, ulcerative colitis, or lupus have had or planning to have an allogeneic stem cell transplant (uses someone else's stem cells) history of organ transplant history of chest radiation nervous system problems like myasthenia gravis or Guillain-Barre syndrome an unusual or allergic reaction to pembrolizumab, other medicines, foods, dyes, or preservatives pregnant or trying to get pregnant breast-feeding How should I use this medication? This medicine is for infusion into a vein. It  is given by a health careprofessional in a hospital or clinic setting. A special MedGuide will be given to you before each treatment. Be sure to readthis information carefully each time. Talk to your pediatrician regarding the use of this medicine in children. While this drug may be prescribed for children as young as 6 months for selectedconditions, precautions do apply. Overdosage: If you think you have taken too much of this medicine contact apoison control center or emergency room at once. NOTE: This medicine is only for you. Do not share this medicine with others. What if I miss a dose? It is important not to miss your dose. Call your doctor or health careprofessional if you are unable to keep an appointment. What may interact with this medication? Interactions have not been studied. This list may not describe all possible interactions. Give your health care provider a list of all the medicines, herbs, non-prescription drugs, or dietary supplements you use. Also tell them if you smoke, drink alcohol, or use illegaldrugs. Some items may interact with your medicine. What should I watch for while using this medication? Your condition will be monitored carefully while you are receiving thismedicine. You may need blood work done while you are taking this medicine. Do not become pregnant while taking this medicine or for 4 months after stopping it. Women should inform their doctor if they wish to become pregnant or think they might be pregnant. There is a potential for serious side effects to an unborn child. Talk to your health care professional or pharmacist for more information. Do not breast-feed an infant while taking this medicine orfor 4 months after the last dose. What side effects  may I notice from receiving this medication? Side effects that you should report to your doctor or health care professionalas soon as possible: allergic reactions like skin rash, itching or hives, swelling of the  face, lips, or tongue bloody or black, tarry breathing problems changes in vision chest pain chills confusion constipation cough diarrhea dizziness or feeling faint or lightheaded fast or irregular heartbeat fever flushing joint pain low blood counts - this medicine may decrease the number of white blood cells, red blood cells and platelets. You may be at increased risk for infections and bleeding. muscle pain muscle weakness pain, tingling, numbness in the hands or feet persistent headache redness, blistering, peeling or loosening of the skin, including inside the mouth signs and symptoms of high blood sugar such as dizziness; dry mouth; dry skin; fruity breath; nausea; stomach pain; increased hunger or thirst; increased urination signs and symptoms of kidney injury like trouble passing urine or change in the amount of urine signs and symptoms of liver injury like dark urine, light-colored stools, loss of appetite, nausea, right upper belly pain, yellowing of the eyes or skin sweating swollen lymph nodes weight loss Side effects that usually do not require medical attention (report to yourdoctor or health care professional if they continue or are bothersome): decreased appetite hair loss tiredness This list may not describe all possible side effects. Call your doctor for medical advice about side effects. You may report side effects to FDA at1-800-FDA-1088. Where should I keep my medication? This drug is given in a hospital or clinic and will not be stored at home. NOTE: This sheet is a summary. It may not cover all possible information. If you have questions about this medicine, talk to your doctor, pharmacist, orhealth care provider.  2022 Elsevier/Gold Standard (2019-01-13 21:44:53)    To help prevent nausea and vomiting after your treatment, we encourage you to take your nausea medication as directed.  BELOW ARE SYMPTOMS THAT SHOULD BE REPORTED IMMEDIATELY: *FEVER  GREATER THAN 100.4 F (38 C) OR HIGHER *CHILLS OR SWEATING *NAUSEA AND VOMITING THAT IS NOT CONTROLLED WITH YOUR NAUSEA MEDICATION *UNUSUAL SHORTNESS OF BREATH *UNUSUAL BRUISING OR BLEEDING *URINARY PROBLEMS (pain or burning when urinating, or frequent urination) *BOWEL PROBLEMS (unusual diarrhea, constipation, pain near the anus) TENDERNESS IN MOUTH AND THROAT WITH OR WITHOUT PRESENCE OF ULCERS (sore throat, sores in mouth, or a toothache) UNUSUAL RASH, SWELLING OR PAIN  UNUSUAL VAGINAL DISCHARGE OR ITCHING   Items with * indicate a potential emergency and should be followed up as soon as possible or go to the Emergency Department if any problems should occur.  Please show the CHEMOTHERAPY ALERT CARD or IMMUNOTHERAPY ALERT CARD at check-in to the Emergency Department and triage nurse.  Should you have questions after your visit or need to cancel or reschedule your appointment, please contact [facility]  [phone] and follow the prompts.  Office hours are 8:00 a.m. to 4:30 p.m. Monday - Friday. Please note that voicemails left after 4:00 p.m. may not be returned until the following business day.  We are closed weekends and major holidays. You have access to a nurse at all times for urgent questions. Please call the main number to the clinic [phone] and follow the prompts.  For any non-urgent questions, you may also contact your provider using MyChart. We now offer e-Visits for anyone 58 and older to request care online for non-urgent symptoms. For details visit [email or web address].   Also download the MyChart app! Go  to the app store, search "MyChart", open the app, select [facility], and log in with your MyChart username and password.  Due to Covid, a mask is required upon entering the hospital/clinic. If you do not have a mask, one will be given to you upon arrival. For doctor visits, patients may have 1 support person aged 85 or older with  them. For treatment visits, patients cannot have anyone with them due to current Covid guidelines and our immunocompromised population.

## 2020-10-07 ENCOUNTER — Other Ambulatory Visit: Payer: Self-pay | Admitting: Oncology

## 2020-10-09 ENCOUNTER — Encounter: Payer: Self-pay | Admitting: Internal Medicine

## 2020-10-11 ENCOUNTER — Other Ambulatory Visit: Payer: Self-pay

## 2020-10-11 DIAGNOSIS — Z1329 Encounter for screening for other suspected endocrine disorder: Secondary | ICD-10-CM

## 2020-10-11 DIAGNOSIS — C109 Malignant neoplasm of oropharynx, unspecified: Secondary | ICD-10-CM

## 2020-10-13 ENCOUNTER — Inpatient Hospital Stay: Payer: Medicare Other

## 2020-10-13 ENCOUNTER — Inpatient Hospital Stay (HOSPITAL_BASED_OUTPATIENT_CLINIC_OR_DEPARTMENT_OTHER): Payer: Medicare Other | Admitting: Oncology

## 2020-10-13 ENCOUNTER — Encounter: Payer: Self-pay | Admitting: Oncology

## 2020-10-13 ENCOUNTER — Telehealth: Payer: Self-pay | Admitting: *Deleted

## 2020-10-13 VITALS — BP 129/76 | HR 69 | Temp 98.0°F | Resp 16 | Wt 106.4 lb

## 2020-10-13 DIAGNOSIS — C3492 Malignant neoplasm of unspecified part of left bronchus or lung: Secondary | ICD-10-CM

## 2020-10-13 DIAGNOSIS — C109 Malignant neoplasm of oropharynx, unspecified: Secondary | ICD-10-CM

## 2020-10-13 DIAGNOSIS — C329 Malignant neoplasm of larynx, unspecified: Secondary | ICD-10-CM

## 2020-10-13 DIAGNOSIS — Z5112 Encounter for antineoplastic immunotherapy: Secondary | ICD-10-CM | POA: Diagnosis not present

## 2020-10-13 DIAGNOSIS — Z1329 Encounter for screening for other suspected endocrine disorder: Secondary | ICD-10-CM

## 2020-10-13 LAB — CBC WITH DIFFERENTIAL/PLATELET
Abs Immature Granulocytes: 0.02 10*3/uL (ref 0.00–0.07)
Basophils Absolute: 0 10*3/uL (ref 0.0–0.1)
Basophils Relative: 0 %
Eosinophils Absolute: 0 10*3/uL (ref 0.0–0.5)
Eosinophils Relative: 0 %
HCT: 29.8 % — ABNORMAL LOW (ref 39.0–52.0)
Hemoglobin: 9.7 g/dL — ABNORMAL LOW (ref 13.0–17.0)
Immature Granulocytes: 0 %
Lymphocytes Relative: 11 %
Lymphs Abs: 0.6 10*3/uL — ABNORMAL LOW (ref 0.7–4.0)
MCH: 32.1 pg (ref 26.0–34.0)
MCHC: 32.6 g/dL (ref 30.0–36.0)
MCV: 98.7 fL (ref 80.0–100.0)
Monocytes Absolute: 0.9 10*3/uL (ref 0.1–1.0)
Monocytes Relative: 16 %
Neutro Abs: 4 10*3/uL (ref 1.7–7.7)
Neutrophils Relative %: 73 %
Platelets: 222 10*3/uL (ref 150–400)
RBC: 3.02 MIL/uL — ABNORMAL LOW (ref 4.22–5.81)
RDW: 18.2 % — ABNORMAL HIGH (ref 11.5–15.5)
WBC: 5.7 10*3/uL (ref 4.0–10.5)
nRBC: 0 % (ref 0.0–0.2)

## 2020-10-13 LAB — COMPREHENSIVE METABOLIC PANEL
ALT: 13 U/L (ref 0–44)
AST: 17 U/L (ref 15–41)
Albumin: 3.5 g/dL (ref 3.5–5.0)
Alkaline Phosphatase: 48 U/L (ref 38–126)
Anion gap: 7 (ref 5–15)
BUN: 14 mg/dL (ref 8–23)
CO2: 25 mmol/L (ref 22–32)
Calcium: 9.1 mg/dL (ref 8.9–10.3)
Chloride: 107 mmol/L (ref 98–111)
Creatinine, Ser: 0.73 mg/dL (ref 0.61–1.24)
GFR, Estimated: >60 mL/min (ref >60)
Glucose, Bld: 120 mg/dL — ABNORMAL HIGH (ref 70–99)
Potassium: 3.9 mmol/L (ref 3.5–5.1)
Sodium: 139 mmol/L (ref 135–145)
Total Bilirubin: 1 mg/dL (ref 0.3–1.2)
Total Protein: 6.7 g/dL (ref 6.5–8.1)

## 2020-10-13 LAB — TSH: TSH: 3.291 u[IU]/mL (ref 0.350–4.500)

## 2020-10-13 LAB — T4, FREE: Free T4: 0.96 ng/dL (ref 0.61–1.12)

## 2020-10-13 LAB — MAGNESIUM: Magnesium: 1.7 mg/dL (ref 1.7–2.4)

## 2020-10-13 MED ORDER — HEPARIN SOD (PORK) LOCK FLUSH 100 UNIT/ML IV SOLN
500.0000 [IU] | Freq: Once | INTRAVENOUS | Status: AC | PRN
  Administered 2020-10-13: 500 [IU]
  Filled 2020-10-13: qty 5

## 2020-10-13 MED ORDER — MAGIC MOUTHWASH W/LIDOCAINE: 5.0000 mL | Freq: Three times a day (TID) | ORAL | 5 refills | Status: DC | PRN

## 2020-10-13 NOTE — Progress Notes (Signed)
Hematology/Oncology progress note [facility] Telephone:([phone] [phone]   Patient Care Team: Casilda Carls, MD as PCP - General (Internal Medicine) Earlie Server, [name], [name]  HISTORY OF PRESENTING ILLNESS:  Gregory Burton is a  80 y.o.  male with PMH listed below was seen in consultation at the request of  Casilda Carls, MD  for evaluation of lymph node.  Patient moved from [place] to [place] for about 3 weeks. He has noticed a neck knot on the right side of his neck. The knot is sore and makes him to cough and he feels it when he swallows. No swallowing difficulty.  Patient was accompanied by his wife. She reports that patient's previous PCP has done neck sonogram for evaluation.  Patient also had right lower molar tooth extraction done a few weeks before he noticed  He also took a course of antibiotics.  Due to his tooth pain, his appetite has decreased and he has lost weight loss, about 20 pounds, he can not specify the time frame.   Alcoholism History of prostate cancer. S/p Radiation/seed, he follows up with [facility].  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  PD-L1-IHC 40%, no targetable mutations. # 09/07/2019 chemotherapy [cisplatin] and radiation.   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  Interval decrease of the  posterior left upper lobe pleural-based nodule with interval decrease in size of the tiny adjacent satellite nodule.  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  No recurrent tumor in right piriform sinus.  Right lateral pharyngeal lymph node is stable.  No new or recurrent adenopathy.Suspect metastasis.  I recommend patient to obtain PET scan restaging.  I discussed about the plan of re-biopsy of either the paraspinal soft tissue mass versus other more feasible hypermetabolic sites detected on PET scan. Patient was seen by Dr. Kennyth Lose and opted to proceed with palliative radiation as soon as possible.    04/20/2020 -04/25/2020, palliative radiation to thoracic spine  05/10/2020, MRI thoracic spine was obtained for worsening of back pain and left shoulder pain.  Images showed metastatic disease throughout almost the entire T2 with and associated mild superior endplate compression fracture.  06/07/2020 -06/09/2020 patient underwent palliative radiation to left scapular April 2022 seen by [name] and flexible laryngoscope examination showed new lesion at the right false vocal cord.  Case was discussed on tumor board on 06/22/2020. [name] recommend additional CT soft tissue neck with contrast for additional evaluation. 06/29/2020 CT images were reviewed and discussed with patient. There is new malignant lymph node in the right mid neck at the level of the hyoid, this compressing the right jugular vein with thrombus in the vein above and below the lymph node.  Cystic retropharyngeal lymph node on the right is stable.  Progressive soft tissue thickening in the larynx bilaterally suspicious for tumor.  Metastatic disease at the T2 is unchanged.  Metastatic deposit left scapula there is now a pathological fracture of the scapular. CT scan findings were discussed with ENT [name] via secure chat.  [name] feels surgery is not feasible.  The larynx lesion currently does not  compromise his airway however further progression may in the future  07/10/2020 Seen by Radonc [name] who would offer salvage radiation to his larynx as well as neck adenopathy. Would like low dose chemotherapy as sensitizer.    Patient was also referred to establish care with Dr. Georgiann Cocker at [facility] for kyphoplasty.  Patient's wife reports that they were never contacted and she called Dr. Georgiann Cocker office with no success.  # NGS.  PD-L1-IHC 40%, no targetable mutations # 07/25/20- 07/27/2020, 08/01/20- 09/06/2020  concurrent chemoradiation [low-dose weekly cisplatin 38m/m2 ] to the larynx  INTERVAL HISTORY Gregory Burton to clinic today for follow-up after recent immunotherapy.  He presents today with his wife.  Reports no new aches or pains.  Has left lower tooth and gum pain from a chipped tooth.  Has trouble eating on the left side and also wearing his dentures.  Has had to eat soft foods over the past 2 months.  His son is coming into town tomorrow to help with a dental appointment.  Would like to try something for his pain.  His appetite is good.  Has a 2 pound weight gain.  Has some chronic weakness and fatigue with age-related aches and pains.  Otherwise is doing well.  He has been taking his magnesium supplement as prescribed.  Review of Systems  Constitutional:  Positive for fatigue.  HENT:          Left sided gum pain  Musculoskeletal:  Positive for arthralgias and myalgias.   MEDICAL HISTORY:  Past Medical History:  Diagnosis Date   Alcohol abuse    Blood transfusion without reported diagnosis 2013   Cataract    Dementia ([facility]    Glaucoma    Gout    Hypercholesteremia    Hypertension    [place]   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 02[place]   [place]   [place] 06/23/[place]    SURGICAL HISTORY: Past Surgical History:  Procedure Laterality Date   BRAIN SURGERY  2012   HERNIA REPAIR     PORTA CATH INSERTION N/A 07/27/2019    Procedure: PORTA CATH INSERTION;  Surgeon: SKatha Cabal MD;  [facility];  Service: Cardiovascular;  Laterality: N/A;    SOCIAL HISTORY: Social History   Socioeconomic History   Marital status: Married    Spouse name: Not on file   Number of children: Not on file   Years of education: Not on file   Highest education level: Not on file  Occupational History   Not on file  Tobacco Use   Smoking status: Former    Years: 21.00    Types: Cigarettes    Quit date: 05/11/2008    Years since quitting: 12.4   Smokeless tobacco: Never  Vaping Use   Vaping Use: Never used  Substance and Sexual Activity   Alcohol use: Yes    Comment: 0.5 pint liquor a week   Drug use: Never   Sexual activity: Not Currently  Other Topics Concern   Not on file  Social History Narrative   Lives at home with wife. Wife states he has some dementia but is able to sign his own consent.   Social Determinants of Health  Financial Resource Strain: Not on file  Food Insecurity: Not on file  Transportation Needs: Not on file  Physical Activity: Not on file  Stress: Not on file  Social Connections: Not on file  Intimate Partner Violence: Not on file    FAMILY HISTORY: History reviewed. No pertinent family history.  ALLERGIES:  is allergic to enalapril.  MEDICATIONS:  Current Outpatient Medications  Medication Sig Dispense Refill   atorvastatin (LIPITOR) 20 MG tablet Take 20 mg by mouth daily.     donepezil (ARICEPT) 10 MG tablet Take 10 mg by mouth at bedtime.     dorzolamide-timolol (COSOPT) 22.3-6.8 MG/ML ophthalmic solution Place 1 drop into both eyes 2 (two) times daily.      ELIQUIS 2.5 MG TABS tablet TAKE 1 TABLET BY MOUTH TWICE A DAY 60 tablet 1   folic acid (FOLVITE) 1 MG tablet Take 1 mg by mouth daily.     latanoprost (XALATAN) 0.005 % ophthalmic solution Place 1 drop into both eyes at bedtime.      lidocaine-prilocaine (EMLA) cream Apply 1 application topically as  needed. Apply small amount to port site approx 1-2 hours prior to appointment. 30 g 1   MAG64 64 MG TBEC TAKE 1 TABLET (64 MG TOTAL) BY MOUTH 2 (TWO) TIMES DAILY. 180 tablet 0   megestrol (MEGACE) 40 MG tablet Take 1 tablet (40 mg total) by mouth 2 (two) times daily. 60 tablet 0   Multiple Vitamins-Minerals (CENTRUM ADULTS PO) Take by mouth.     Thiamine HCl (VITAMIN B-1 PO) Take 100 mg by mouth daily.     oxyCODONE (OXY IR/ROXICODONE) 5 MG immediate release tablet Take 1 tablet (5 mg total) by mouth every 4 (four) hours as needed for severe pain. (Patient not taking: No sig reported) 90 tablet 0   No current facility-administered medications for this visit.   Facility-Administered Medications Ordered in Other Visits  Medication Dose Route Frequency Provider Last Rate Last Admin   sodium chloride flush (NS) 0.9 % injection 10 mL  10 mL Intravenous PRN Earlie Server, MD   10 mL at 03/01/20 0812     PHYSICAL EXAMINATION: ECOG PERFORMANCE STATUS: 1 - Symptomatic but completely ambulatory Vitals:   10/13/20 0945  BP: 129/76  Pulse: 69  Resp: 16  Temp: 98 F (36.7 C)   Filed Weights   10/13/20 0945  Weight: 106 lb 6.4 oz (48.3 kg)    Physical Exam Constitutional:      Appearance: Normal appearance.  HENT:     Head: Normocephalic and atraumatic.  Eyes:     Pupils: Pupils are equal, round, and reactive to light.  Cardiovascular:     Rate and Rhythm: Normal rate and regular rhythm.     Heart sounds: Normal heart sounds. No murmur heard. Pulmonary:     Effort: Pulmonary effort is normal.     Breath sounds: Normal breath sounds. No wheezing.  Abdominal:     General: Bowel sounds are normal. There is no distension.     Palpations: Abdomen is soft.     Tenderness: There is no abdominal tenderness.  Musculoskeletal:        General: Normal range of motion.     Cervical back: Normal range of motion.  Skin:    General: Skin is warm and dry.     Findings: No rash.  Neurological:      Mental Status: He is alert and oriented to person, place, and time.  Psychiatric:  Judgment: Judgment normal.    LABORATORY DATA:  I have reviewed the data as listed Lab Results  Component Value Date   WBC 5.7 10/13/2020   HGB 9.7 (L) 10/13/2020   HCT 29.8 (L) 10/13/2020   MCV 98.7 10/13/2020   PLT 222 10/13/2020   Recent Labs    11/15/19 0818 12/31/19 0927 09/06/20 0902 09/11/20 1715 10/04/20 0754  NA 141   < > 136 138 139  K 4.1   < > 3.4* 3.9 3.6  CL 109   < > 103 100 106  CO2 24   < > 25 27 27   GLUCOSE 98   < > 112* 117* 121*  BUN 19   < > 21 21 14   CREATININE 0.88   < > 0.70 0.79 0.77  CALCIUM 9.2   < > 8.6* 9.1 9.1  GFRNONAA >60   < > >60 >60 >60  GFRAA >60  --   --   --   --   PROT 7.0   < > 6.1* 6.4* 6.4*  ALBUMIN 3.9   < > 3.2* 3.3* 3.5  AST 17   < > 21 30 19   ALT 19   < > 28 30 14   ALKPHOS 41   < > 63 60 52  BILITOT 1.0   < > 0.4 0.9 0.3   < > = values in this interval not displayed.   Iron/TIBC/Ferritin/ %Sat No results found for: IRON, TIBC, FERRITIN, IRONPCTSAT    RADIOGRAPHIC STUDIES: I have personally reviewed the radiological images as listed and agreed with the findings in the report. No results found.       ASSESSMENT & PLAN:  No diagnosis found. Cancer Staging Larynx cancer [facility]) Staging form: Larynx - Glottis, AJCC 8th Edition - Clinical: Stage IVC (cT1, cNX, cM1) - Signed by Earlie Server, MD on 07/26/2020  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[facility]) Staging form: Pharynx - P16 Negative Oropharynx, AJCC 8th Edition - Clinical stage from 06/23/2019: Stage IVB (cT4b, cN2b, cM0, p16-) - Signed by Earlie Server, MD on 06/23/2019  Squamous cell lung cancer [facility]) Staging form: Lung, AJCC 8th Edition - Clinical: cT1, cN0, cM0 - Signed by Earlie Server, MD on 07/19/2019  Stage IV head and neck cancer- He is status post 6 cycles of cisplatin with concurrent radiation.   He completed treatment on 09/06/2020. He started immunotherapy with Keytruda on 10/04/2020.  Appears  to have tolerated well.  Labs from today are very stable.  He does not need any additional IV fluids or electrolytes.  He is scheduled to return to clinic on 10/25/2020 for cycle 2 Keytruda.  Hypomagnesemia- He has been compliant with his magnesium supplements.  Magnesium level has improved and is 1.7 today.  History of stage I left upper lobe lung cancer- Completed SBRT on 02/07/2020.  Stable.  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   Plan is to reimage 8 to 10 weeks after radiation.  Tooth/gum pain- Apparently has chipped a tooth and it is causing significant irritation to his gum.  His son is coming to town tomorrow and they anticipate making a dental appointment within the next couple weeks.  Patient can try Magic mouthwash with lidocaine.  New prescription sent.  I spent 25 minutes dedicated to the care of this patient (face-to-face and non-face-to-face) on the date of the encounter to include what is described in the assessment and plan.  Faythe Casa, NP 10/13/2020 12:41 PM

## 2020-10-13 NOTE — Telephone Encounter (Signed)
Pharmacy called to report that they do not have the ingredients for magic mouth wash and will not have it until next week they suggested trying another pharmacy.

## 2020-10-13 NOTE — Progress Notes (Signed)
Patient has a broken tooth for a couple of months and has a dental appt next month. Is going to use oral gel in the mean time.

## 2020-10-13 NOTE — Telephone Encounter (Signed)
Patient's wife informed and she prefers to wait for [facility] to have ingredients.  Pharmacy aware.

## 2020-10-13 NOTE — Patient Instructions (Signed)
I sent in a prescription for Magic mouthwash to help with your gum and tooth pain.  Please establish care with a dentist as soon as possible.  It is okay to take the medication you purchased for your gum pain.  You can use the mouthwash first and then apply the gel to your gum to help you eat.  Continue magnesium supplements as prescribed.  If okay by Dr. Dennis Bast can use the CBD oil.  Continue your Eliquis.  Labs from today look great.  Keep up the good work.  We will see you back as scheduled on 10/25/2020.

## 2020-10-13 NOTE — Telephone Encounter (Signed)
FYI

## 2020-10-17 ENCOUNTER — Encounter: Payer: Self-pay | Admitting: Radiation Oncology

## 2020-10-17 ENCOUNTER — Ambulatory Visit
Admission: RE | Admit: 2020-10-17 | Discharge: 2020-10-17 | Disposition: A | Discharge status: Home / Self Care | Payer: Medicare Other | Source: Ambulatory Visit | Attending: Radiation Oncology | Admitting: Radiation Oncology

## 2020-10-17 ENCOUNTER — Inpatient Hospital Stay: Payer: Medicare Other

## 2020-10-17 VITALS — BP 142/78 | HR 72 | Temp 98.1°F | Wt 106.4 lb

## 2020-10-17 DIAGNOSIS — C7839 Secondary malignant neoplasm of other respiratory organs: Secondary | ICD-10-CM | POA: Diagnosis present

## 2020-10-17 DIAGNOSIS — K0889 Other specified disorders of teeth and supporting structures: Secondary | ICD-10-CM | POA: Insufficient documentation

## 2020-10-17 DIAGNOSIS — C01 Malignant neoplasm of base of tongue: Secondary | ICD-10-CM | POA: Diagnosis not present

## 2020-10-17 DIAGNOSIS — Z923 Personal history of irradiation: Secondary | ICD-10-CM | POA: Insufficient documentation

## 2020-10-17 DIAGNOSIS — C7951 Secondary malignant neoplasm of bone: Secondary | ICD-10-CM | POA: Insufficient documentation

## 2020-10-17 DIAGNOSIS — C779 Secondary and unspecified malignant neoplasm of lymph node, unspecified: Secondary | ICD-10-CM | POA: Diagnosis not present

## 2020-10-17 DIAGNOSIS — C3412 Malignant neoplasm of upper lobe, left bronchus or lung: Secondary | ICD-10-CM | POA: Diagnosis not present

## 2020-10-17 DIAGNOSIS — Z87891 Personal history of nicotine dependence: Secondary | ICD-10-CM | POA: Diagnosis not present

## 2020-10-17 DIAGNOSIS — C109 Malignant neoplasm of oropharynx, unspecified: Secondary | ICD-10-CM

## 2020-10-17 NOTE — Progress Notes (Signed)
Radiation Oncology Follow up Note  Name: Gibbs Naugle   Date:   10/17/2020 MRN:  [id number] DOB: February 20, 1941    This 80 y.o. male presents to the clinic today for 1 month follow-up status post XRT to his larynx with PET showing persistent hypermetabolic to be in the right lateral retropharyngeal region as well as level 3 lymph nodes.Marland Kitchen  REFERRING PROVIDER: Casilda Carls, MD  HPI: Patient is an 80 year old male having undergone multiple courses of radiation therapy.  Originally treated in 21 for stage IV squamous cell carcinoma the tongue base.  He also has stage I left upper lobe squamous cell carcinoma treated that with SBRT.  Also treated to his thoracic spine as well as left scapula.  Recently now 1 month out from head and neck treatment.  He is doing well.  He specifically denies dysphagia head and neck pain has a mild slight cough..  He is seeing a dentist for some left tooth pain.  All areas of previous pain have been palliated with radiation therapy.  He is currently on Keytruda for maintenance tolerating that well.  COMPLICATIONS OF TREATMENT: none  FOLLOW UP COMPLIANCE: keeps appointments   PHYSICAL EXAM:  BP (!) 142/78   Pulse 72   Temp 98.1 F (36.7 C) (Tympanic)   Wt 106 lb 6.4 oz (48.3 kg)   BMI 15.71 kg/m  Neck is clear without evidence of cervical or supraclavicular adenopathy.  Oral cavity is clear.  Well-developed well-nourished patient in NAD. HEENT reveals PERLA, EOMI, discs not visualized.  Oral cavity is clear. No oral mucosal lesions are identified. Neck is clear without evidence of cervical or supraclavicular adenopathy. Lungs are clear to A&P. Cardiac examination is essentially unremarkable with regular rate and rhythm without murmur rub or thrill. Abdomen is benign with no organomegaly or masses noted. Motor sensory and DTR levels are equal and symmetric in the upper and lower extremities. Cranial nerves II through XII are grossly intact. Proprioception is intact.  No peripheral adenopathy or edema is identified. No motor or sensory levels are noted. Crude visual fields are within normal range.  RADIOLOGY RESULTS: No current films to review  PLAN: Present time patient is doing well.Marland Kitchen  He continues on Keytruda under medical oncology's direction.  He continues follow-up care with ENT.  I have asked to see him back in 3 to 4 months for follow-up.  We will review any scans at become available prior to that appointment.  I would like to take this opportunity to thank you for allowing me to participate in the care of your patient.Noreene Filbert, MD

## 2020-10-22 ENCOUNTER — Other Ambulatory Visit: Payer: Self-pay | Admitting: Oncology

## 2020-10-23 ENCOUNTER — Encounter: Payer: Self-pay | Admitting: Oncology

## 2020-10-25 ENCOUNTER — Encounter: Payer: Self-pay | Admitting: Oncology

## 2020-10-25 ENCOUNTER — Other Ambulatory Visit: Payer: Self-pay | Admitting: Oncology

## 2020-10-25 ENCOUNTER — Inpatient Hospital Stay: Payer: Medicare Other

## 2020-10-25 ENCOUNTER — Other Ambulatory Visit: Payer: Self-pay

## 2020-10-25 ENCOUNTER — Inpatient Hospital Stay (HOSPITAL_BASED_OUTPATIENT_CLINIC_OR_DEPARTMENT_OTHER): Payer: Medicare Other | Admitting: Oncology

## 2020-10-25 ENCOUNTER — Other Ambulatory Visit (HOSPITAL_COMMUNITY): Payer: Self-pay

## 2020-10-25 VITALS — BP 142/91 | HR 82 | Temp 98.3°F | Resp 18 | Wt 106.0 lb

## 2020-10-25 DIAGNOSIS — C329 Malignant neoplasm of larynx, unspecified: Secondary | ICD-10-CM | POA: Diagnosis not present

## 2020-10-25 DIAGNOSIS — C3492 Malignant neoplasm of unspecified part of left bronchus or lung: Secondary | ICD-10-CM | POA: Diagnosis not present

## 2020-10-25 DIAGNOSIS — Z95828 Presence of other vascular implants and grafts: Secondary | ICD-10-CM

## 2020-10-25 DIAGNOSIS — Z5112 Encounter for antineoplastic immunotherapy: Secondary | ICD-10-CM

## 2020-10-25 DIAGNOSIS — C109 Malignant neoplasm of oropharynx, unspecified: Secondary | ICD-10-CM

## 2020-10-25 DIAGNOSIS — R634 Abnormal weight loss: Secondary | ICD-10-CM

## 2020-10-25 LAB — CBC WITH DIFFERENTIAL/PLATELET
Abs Immature Granulocytes: 0.03 10*3/uL (ref 0.00–0.07)
Basophils Absolute: 0 10*3/uL (ref 0.0–0.1)
Basophils Relative: 1 %
Eosinophils Absolute: 0.1 10*3/uL (ref 0.0–0.5)
Eosinophils Relative: 2 %
HCT: 33.5 % — ABNORMAL LOW (ref 39.0–52.0)
Hemoglobin: 10.6 g/dL — ABNORMAL LOW (ref 13.0–17.0)
Immature Granulocytes: 1 %
Lymphocytes Relative: 12 %
Lymphs Abs: 0.8 10*3/uL (ref 0.7–4.0)
MCH: 31.5 pg (ref 26.0–34.0)
MCHC: 31.6 g/dL (ref 30.0–36.0)
MCV: 99.4 fL (ref 80.0–100.0)
Monocytes Absolute: 0.9 10*3/uL (ref 0.1–1.0)
Monocytes Relative: 14 %
Neutro Abs: 4.5 10*3/uL (ref 1.7–7.7)
Neutrophils Relative %: 70 %
Platelets: 228 10*3/uL (ref 150–400)
RBC: 3.37 MIL/uL — ABNORMAL LOW (ref 4.22–5.81)
RDW: 15.9 % — ABNORMAL HIGH (ref 11.5–15.5)
WBC: 6.3 10*3/uL (ref 4.0–10.5)
nRBC: 0 % (ref 0.0–0.2)

## 2020-10-25 LAB — COMPREHENSIVE METABOLIC PANEL
ALT: 13 U/L (ref 0–44)
AST: 18 U/L (ref 15–41)
Albumin: 3.9 g/dL (ref 3.5–5.0)
Alkaline Phosphatase: 54 U/L (ref 38–126)
Anion gap: 7 (ref 5–15)
BUN: 18 mg/dL (ref 8–23)
CO2: 24 mmol/L (ref 22–32)
Calcium: 9.5 mg/dL (ref 8.9–10.3)
Chloride: 107 mmol/L (ref 98–111)
Creatinine, Ser: 0.83 mg/dL (ref 0.61–1.24)
GFR, Estimated: >60 mL/min (ref >60)
Glucose, Bld: 108 mg/dL — ABNORMAL HIGH (ref 70–99)
Potassium: 4 mmol/L (ref 3.5–5.1)
Sodium: 138 mmol/L (ref 135–145)
Total Bilirubin: 0.9 mg/dL (ref 0.3–1.2)
Total Protein: 7.3 g/dL (ref 6.5–8.1)

## 2020-10-25 LAB — MAGNESIUM: Magnesium: 1.7 mg/dL (ref 1.7–2.4)

## 2020-10-25 MED ORDER — SODIUM CHLORIDE 0.9% FLUSH
10.0000 mL | Freq: Once | INTRAVENOUS | Status: AC
  Administered 2020-10-25: 10 mL via INTRAVENOUS
  Filled 2020-10-25: qty 10

## 2020-10-25 MED ORDER — HEPARIN SOD (PORK) LOCK FLUSH 100 UNIT/ML IV SOLN
500.0000 [IU] | Freq: Once | INTRAVENOUS | Status: AC
  Administered 2020-10-25: 500 [IU] via INTRAVENOUS
  Filled 2020-10-25: qty 5

## 2020-10-25 MED ORDER — SODIUM CHLORIDE 0.9 % IV SOLN
Freq: Once | INTRAVENOUS | Status: AC
  Filled 2020-10-25: qty 250

## 2020-10-25 MED ORDER — HEPARIN SOD (PORK) LOCK FLUSH 100 UNIT/ML IV SOLN
INTRAVENOUS | Status: AC
  Filled 2020-10-25: qty 5

## 2020-10-25 MED ORDER — SODIUM CHLORIDE 0.9 % IV SOLN
200.0000 mg | Freq: Once | INTRAVENOUS | Status: AC
  Administered 2020-10-25: 200 mg via INTRAVENOUS
  Filled 2020-10-25: qty 8

## 2020-10-25 NOTE — Progress Notes (Signed)
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  Patient completed concurrent chemotherapy and radiation to right vocal cord.  Patient receiving keytruda at this time.  Spoke with wife via phone for nutrition follow-up.  Wife reports that appetite has been good/same.  Denies any nutrition impact symptoms from [place].  Reports patient has dentist appointment next month.  Patient continues to drink ensure plus TID for added nutrition.      Medications: reviewed  Labs: reviewed  Anthropometrics:   Weight 106 lb today  104 lb 12.8 oz on 8/10 104 lb on 7/13 104 lb 11.2 oz on 7/6 107 lb on 6/8 109 lb on 6/1 112 lb on 4/1 118 lb on 2/8   NUTRITION DIAGNOSIS: Inadequate oral intake stable   INTERVENTION:  Continue ensure plus TID, weight is increasing Continue high calorie, high protein foods    MONITORING, EVALUATION, GOAL: weight trends, intake   NEXT VISIT: Wed, Sept 21 during infusion  Emilie Carp B. Zenia Resides, [place], [place] Registered Dietitian [phone] (mobile)

## 2020-10-25 NOTE — Progress Notes (Signed)
Hematology/Oncology progress note [facility] Telephone:([phone] [phone]   Patient Care Team: Casilda Carls, MD as PCP - General (Internal Medicine) Earlie Server, [name], [name]  HISTORY OF PRESENTING ILLNESS:  Gregory Burton is a  80 y.o.  male with PMH listed below was seen in consultation at the request of  Casilda Carls, MD  for evaluation of lymph node.  Patient moved from [place] to [place] for about 3 weeks. He has noticed a neck knot on the right side of his neck. The knot is sore and makes him to cough and he feels it when he swallows. No swallowing difficulty.  Patient was accompanied by his wife. She reports that patient's previous PCP has done neck sonogram for evaluation.  Patient also had right lower molar tooth extraction done a few weeks before he noticed  He also took a course of antibiotics.  Due to his tooth pain, his appetite has decreased and he has lost weight loss, about 20 pounds, he can not specify the time frame.   Alcoholism History of prostate cancer. S/p Radiation/seed, he follows up with [facility].   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  PD-L1-IHC 40%, no targetable mutations. # 09/07/2019 chemotherapy [cisplatin] and radiation.   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  Interval decrease of the  posterior left upper lobe pleural-based nodule with interval decrease in size of the tiny adjacent satellite nodule.  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  No recurrent tumor in right piriform sinus.  Right lateral pharyngeal lymph node is stable.  No new or recurrent adenopathy.Suspect metastasis.  I recommend patient to obtain PET scan restaging.  I discussed about the plan of re-biopsy of either the paraspinal soft tissue mass versus other more feasible hypermetabolic sites detected on PET scan. Patient was seen by Dr. Kennyth Lose and opted to proceed with palliative radiation as soon as possible.  PET scan cannot be arranged prior to the radiation so the PET scan was canceled. 04/20/2020 -04/25/2020, palliative radiation to thoracic spine  05/10/2020, MRI thoracic spine was obtained for worsening of back pain and left shoulder pain.  Images showed metastatic disease throughout almost the entire T2 with and associated mild superior endplate compression fracture.  Abnormal signal in the anterior, inferior endplate of T1, superior aspect of the T3.  Small metastatic deposit in T12 is also noted. Case was also discussed on multidisciplinary tumor board on 05/25/2020 05/22/2020, PET scan showed new hypermetabolic lytic bone metastasis in the left scapular near the glenoid and in the T2 vertebral body.  Persistent hypermetabolic right lateral retropharyngeal and right level 3 neck lymph node metastasis.  Peripheral left upper lobe pulmonary nodule is mildly decreased with new surrounding mild platelike postradiation changes..  No residual recurrent neoplasm at the site of vomiting in the right piriform sinus  06/07/2020 -06/09/2020 patient underwent palliative radiation to left  scapular April 2022 seen by [name] and flexible laryngoscope examination showed new lesion at the right false vocal cord.  Case was discussed on tumor board on 06/22/2020. [name] recommend additional CT  soft tissue neck with contrast for additional evaluation. 06/29/2020 CT images were reviewed and discussed with patient. There is new malignant lymph node in the right mid neck at the level of the hyoid, this compressing the right jugular vein with thrombus in the vein above and below the lymph node.  Cystic retropharyngeal lymph node on the right is stable.  Progressive soft tissue thickening in the larynx bilaterally suspicious for tumor.  Metastatic disease at the T2 is unchanged.  Metastatic deposit left scapula there is now a pathological fracture of the scapular. CT scan findings were discussed with ENT [name] via secure chat.  [name] feels surgery is not feasible.  The larynx lesion currently does not compromise his airway however further progression may in the future  07/10/2020 Seen by Radonc [name] who would offer salvage radiation to his larynx as well as neck adenopathy. Would like low dose chemotherapy as sensitizer.    Patient was also referred to establish care with Dr. Georgiann Cocker at [facility] for kyphoplasty.  Patient's wife reports that they were never contacted and she called Dr. Georgiann Cocker office with no success.  # NGS.  PD-L1-IHC 40%, no targetable mutations # 07/25/20- 07/27/2020, 08/01/20- 09/06/2020  concurrent chemoradiation [low-dose weekly cisplatin 71m/m2 ] to the larynx  09/15/2020, patient was seen by ENT Dr. MTami Ribas  Laryngoscope examination reviewed clear larynx, no lesion was discovered. 10/04/2020, started on maintenance Keytruda.  INTERVAL HISTORY Gregory Brinkleyis a 80y.o. male who has above history reviewed by me today presents for follow-up of head and neck cancer and lung cancer Patient was accompanied by wife.  He tolerates first cycle of Keytruda.  No nausea vomiting diarrhea.  Patient reports some pain with swallowing.  Appetite is fair.  Weight has been stable.   Review of Systems  Constitutional:  Positive for fatigue. Negative for appetite change,  chills, fever and unexpected weight change.  HENT:   Positive for hearing loss. Negative for voice change.   Eyes:  Negative for eye problems and icterus.  Respiratory:  Negative for chest tightness, cough and shortness of breath.   Cardiovascular:  Negative for chest pain and leg swelling.  Gastrointestinal:  Negative for abdominal distention, abdominal pain, blood in stool and nausea.  Endocrine: Negative for hot flashes.  Genitourinary:  Negative for difficulty urinating, dysuria and frequency.   Musculoskeletal:  Positive for arthralgias.  Skin:  Negative for itching and rash.  Neurological:  Negative for extremity weakness, light-headedness and numbness.  Hematological:  Negative for adenopathy. Does not bruise/bleed easily.  Psychiatric/Behavioral:  Negative for confusion.        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 [facility];  Service: Cardiovascular;  Laterality: N/A;    SOCIAL HISTORY: Social History   Socioeconomic History   Marital status: Married    Spouse name: Not on file   Number of children: Not on file   Years of education: Not on file   Highest education level: Not on file  Occupational History   Not on file  Tobacco Use   Smoking status: Former    Years: 21.00    Types: Cigarettes    Quit date: 05/11/2008    Years since quitting: 12.4   Smokeless tobacco: Never  Vaping Use   Vaping Use:  Never used  Substance and Sexual Activity   Alcohol use: Yes    Comment: 0.5 pint liquor a week   Drug use: Never   Sexual activity: Not Currently  Other Topics Concern   Not on file  Social History Narrative   Lives at home with wife. Wife states he has some dementia but is able to sign his own consent.   Social Determinants of Health   Financial Resource Strain: Not on file  Food Insecurity: Not on file  Transportation Needs: Not on file  Physical Activity: Not on file  Stress: Not on file  Social Connections: Not on file  Intimate Partner Violence: Not on file    FAMILY HISTORY: No family history on file.  ALLERGIES:  is allergic to enalapril.  MEDICATIONS:  Current Outpatient Medications  Medication Sig Dispense Refill   atorvastatin (LIPITOR) 20 MG tablet Take 20 mg by mouth daily.     donepezil (ARICEPT) 10 MG tablet Take 10 mg by mouth at bedtime.     dorzolamide-timolol (COSOPT) 22.3-6.8 MG/ML ophthalmic solution Place 1 drop into both eyes 2 (two) times daily.      ELIQUIS 2.5 MG TABS tablet TAKE 1 TABLET BY MOUTH TWICE A DAY 60 tablet 1   folic acid (FOLVITE) 1 MG tablet Take 1 mg by mouth daily.     latanoprost (XALATAN) 0.005 % ophthalmic solution Place 1 drop into both eyes at bedtime.      lidocaine-prilocaine (EMLA) cream Apply 1 application topically as needed. Apply small amount to port site approx 1-2 hours prior to appointment. 30 g 1   MAG64 64 MG TBEC TAKE 1 TABLET (64 MG TOTAL) BY MOUTH 2 (TWO) TIMES DAILY. (Patient not taking: Reported on 10/17/2020) 180 tablet 0   magic mouthwash w/lidocaine SOLN Take 5 mLs by mouth 3 (three) times daily as needed for mouth pain. (Patient not taking: Reported on 10/17/2020) 5 mL 5   megestrol (MEGACE) 40 MG tablet TAKE 1 TABLET BY MOUTH TWICE A DAY 60 tablet 0   Multiple Vitamins-Minerals (CENTRUM ADULTS PO) Take by mouth.     oxyCODONE (OXY IR/ROXICODONE) 5 MG immediate release tablet Take 1 tablet (5 mg total) by mouth  every 4 (four) hours as needed for severe pain. (Patient not taking: No sig reported) 90 tablet 0   Thiamine HCl (VITAMIN B-1 PO) Take 100 mg by mouth daily.     No current facility-administered medications for this visit.   Facility-Administered Medications Ordered in Other Visits  Medication Dose Route Frequency Provider Last Rate Last Admin   heparin lock flush 100 unit/mL  500 Units Intravenous Once Earlie Server, MD       sodium chloride flush (NS) 0.9 % injection 10 mL  10 mL Intravenous PRN Earlie Server, MD   10 mL at 03/01/20 [phone]  sodium chloride flush (NS) 0.9 % injection 10 mL  10 mL Intravenous Once Earlie Server, MD         PHYSICAL EXAMINATION: ECOG PERFORMANCE STATUS: 1 - Symptomatic but completely ambulatory Vitals:   10/25/20 0844  BP: (!) 142/91  Pulse: 82  Resp: 18  Temp: 98.3 F (36.8 C)  SpO2: 100%   Filed Weights   10/25/20 0844  Weight: 106 lb (48.1 kg)    Physical Exam Constitutional:      General: He is not in acute distress. HENT:     Head: Normocephalic and atraumatic.  Eyes:     General: No scleral icterus. Cardiovascular:     Rate and Rhythm: Normal rate and regular rhythm.     Heart sounds: Normal heart sounds.  Pulmonary:     Effort: Pulmonary effort is normal. No respiratory distress.     Breath sounds: No wheezing.  Abdominal:     General: Bowel sounds are normal. There is no distension.     Palpations: Abdomen is soft.  Musculoskeletal:        General: No deformity.     Cervical back: Normal range of motion and neck supple.  Lymphadenopathy:     Cervical: No cervical adenopathy.  Skin:    General: Skin is warm and dry.     Findings: No erythema or rash.  Neurological:     Mental Status: He is alert. Mental status is at baseline.     Cranial Nerves: No cranial nerve deficit.     Coordination: Coordination normal.    LABORATORY DATA:  I have reviewed the data as listed Lab Results  Component Value Date   WBC 5.7 10/13/2020   HGB 9.7  (L) 10/13/2020   HCT 29.8 (L) 10/13/2020   MCV 98.7 10/13/2020   PLT 222 10/13/2020   Recent Labs    11/15/19 0818 12/31/19 0927 09/11/20 1715 10/04/20 0754 10/13/20 0927  NA 141   < > 138 139 139  K 4.1   < > 3.9 3.6 3.9  CL 109   < > 100 106 107  CO2 24   < > _0 GLUCOSE 98   < > 117* 121* 120*  BUN 19   < > _1 CREATININE 0.88   < > 0.79 0.77 0.73  CALCIUM 9.2   < > 9.1 9.1 9.1  GFRNONAA >60   < > >60 >60 >60  GFRAA >60  --   --   --   --   PROT 7.0   < > 6.4* 6.4* 6.7  ALBUMIN 3.9   < > 3.3* 3.5 3.5  AST 17   < > _2 ALT 19   < > _3 ALKPHOS 41   < > 60 52 48  BILITOT 1.0   < > 0.9 0.3 1.0   < > = values in this interval not displayed.    Iron/TIBC/Ferritin/ %Sat No results found for: IRON, TIBC, FERRITIN, IRONPCTSAT    RADIOGRAPHIC STUDIES: I have personally reviewed the radiological images as listed and agreed with the findings in the report. No results found.  ASSESSMENT & PLAN:  1. Squamous cell carcinoma of left lung ([place])   2. 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 Encounter for antineoplastic immunotherapy   5. Weight loss   6. 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  He tolerates treatment well. Proceed with Keytruda today.  Plan to obtain CT neck/chest/abdomen/pelvis with contrast in October 2022.  Ordered  #Radiation induced esophagitis, recommend Magic mouthwash swish and he may swallow small amount of Magic mouthwash which may help his pain in the throat.  #Hypomagnesia, continue oral Slow-Mag, continue 1 tablet twice daily.    #Left upper lobe squamous cell carcinoma, stage I Finished SBRT on 02/07/2020.  Monitor disease status.  #Right jugular vein thrombus, continue Eliquis 2.5 mg twice daily.  We will decide duration of anticoagulation after reimaging.  #Weight loss, weight has been stable.  Continue nutrition supplementation.    all questions were answered. The patient knows to call the clinic with any problems questions or concerns.   Return of visit: lab/MD/Keytruda in 3 weeks  ZEarlie Server MD, PhD Hematology Oncology [facility] Centerat [facility] Surgery CenterPager- [phone]/31/2022

## 2020-10-25 NOTE — Progress Notes (Signed)
Patient here for oncology follow-up appointment, expresses concerns  weakness & SOB

## 2020-10-25 NOTE — Patient Instructions (Signed)
[facility]   Discharge Instructions: Thank you for choosing [facility] to provide your oncology and hematology care.  If you have a lab appointment with the [facility], please go directly to the [facility] and check in at the registration area.  Wear comfortable clothing and clothing appropriate for easy access to any Portacath or PICC line.   We strive to give you quality time with your provider. You may need to reschedule your appointment if you arrive late (15 or more minutes).  Arriving late affects you and other patients whose appointments are after yours.  Also, if you miss three or more appointments without notifying the office, you may be dismissed from the clinic at the provider's discretion.      For prescription refill requests, have your pharmacy contact our office and allow 72 hours for refills to be completed.    Today you received the following chemotherapy and/or immunotherapy agents: Keytruda.      To help prevent nausea and vomiting after your treatment, we encourage you to take your nausea medication as directed.  BELOW ARE SYMPTOMS THAT SHOULD BE REPORTED IMMEDIATELY: *FEVER GREATER THAN 100.4 F (38 C) OR HIGHER *CHILLS OR SWEATING *NAUSEA AND VOMITING THAT IS NOT CONTROLLED WITH YOUR NAUSEA MEDICATION *UNUSUAL SHORTNESS OF BREATH *UNUSUAL BRUISING OR BLEEDING *URINARY PROBLEMS (pain or burning when urinating, or frequent urination) *BOWEL PROBLEMS (unusual diarrhea, constipation, pain near the anus) TENDERNESS IN MOUTH AND THROAT WITH OR WITHOUT PRESENCE OF ULCERS (sore throat, sores in mouth, or a toothache) UNUSUAL RASH, SWELLING OR PAIN  UNUSUAL VAGINAL DISCHARGE OR ITCHING   Items with * indicate a potential emergency and should be followed up as soon as possible or go to the Emergency Department if any problems should occur.  Please show the CHEMOTHERAPY ALERT CARD or IMMUNOTHERAPY ALERT CARD at  check-in to the Emergency Department and triage nurse.  Should you have questions after your visit or need to cancel or reschedule your appointment, please contact [facility]  [phone] and follow the prompts.  Office hours are 8:00 a.m. to 4:30 p.m. Monday - Friday. Please note that voicemails left after 4:00 p.m. may not be returned until the following business day.  We are closed weekends and major holidays. You have access to a nurse at all times for urgent questions. Please call the main number to the clinic [phone] and follow the prompts.  For any non-urgent questions, you may also contact your provider using MyChart. We now offer e-Visits for anyone 16 and older to request care online for non-urgent symptoms. For details visit [email or web address].   Also download the MyChart app! Go to the app store, search "MyChart", open the app, select [facility], and log in with your MyChart username and password.  Due to Covid, a mask is required upon entering the hospital/clinic. If you do not have a mask, one will be given to you upon arrival. For doctor visits, patients may have 1 support person aged 38 or older with them. For treatment visits, patients cannot have anyone with them due to current Covid guidelines and our immunocompromised population.

## 2020-10-26 ENCOUNTER — Other Ambulatory Visit: Payer: Self-pay | Admitting: Oncology

## 2020-10-30 ENCOUNTER — Encounter: Payer: Self-pay | Admitting: Oncology

## 2020-10-31 ENCOUNTER — Other Ambulatory Visit: Payer: Self-pay | Admitting: Oncology

## 2020-10-31 DIAGNOSIS — C329 Malignant neoplasm of larynx, unspecified: Secondary | ICD-10-CM

## 2020-11-01 ENCOUNTER — Encounter: Payer: Self-pay | Admitting: Oncology

## 2020-11-10 ENCOUNTER — Other Ambulatory Visit: Payer: Self-pay | Admitting: Oncology

## 2020-11-13 ENCOUNTER — Encounter: Payer: Self-pay | Admitting: Oncology

## 2020-11-15 ENCOUNTER — Encounter: Payer: Self-pay | Admitting: Oncology

## 2020-11-15 ENCOUNTER — Inpatient Hospital Stay: Payer: Medicare Other

## 2020-11-15 ENCOUNTER — Inpatient Hospital Stay: Payer: Medicare Other | Attending: Oncology

## 2020-11-15 ENCOUNTER — Inpatient Hospital Stay (HOSPITAL_BASED_OUTPATIENT_CLINIC_OR_DEPARTMENT_OTHER): Payer: Medicare Other | Admitting: Oncology

## 2020-11-15 VITALS — BP 143/90 | HR 72 | Temp 96.5°F | Resp 16 | Wt 107.0 lb

## 2020-11-15 DIAGNOSIS — C109 Malignant neoplasm of oropharynx, unspecified: Secondary | ICD-10-CM

## 2020-11-15 DIAGNOSIS — Z5112 Encounter for antineoplastic immunotherapy: Secondary | ICD-10-CM

## 2020-11-15 DIAGNOSIS — C7951 Secondary malignant neoplasm of bone: Secondary | ICD-10-CM | POA: Diagnosis present

## 2020-11-15 DIAGNOSIS — C329 Malignant neoplasm of larynx, unspecified: Secondary | ICD-10-CM

## 2020-11-15 DIAGNOSIS — Z79899 Other long term (current) drug therapy: Secondary | ICD-10-CM | POA: Insufficient documentation

## 2020-11-15 DIAGNOSIS — C01 Malignant neoplasm of base of tongue: Secondary | ICD-10-CM | POA: Insufficient documentation

## 2020-11-15 DIAGNOSIS — C3492 Malignant neoplasm of unspecified part of left bronchus or lung: Secondary | ICD-10-CM | POA: Diagnosis not present

## 2020-11-15 LAB — COMPREHENSIVE METABOLIC PANEL
ALT: 13 U/L (ref 0–44)
AST: 18 U/L (ref 15–41)
Albumin: 4 g/dL (ref 3.5–5.0)
Alkaline Phosphatase: 53 U/L (ref 38–126)
Anion gap: 8 (ref 5–15)
BUN: 15 mg/dL (ref 8–23)
CO2: 25 mmol/L (ref 22–32)
Calcium: 9.5 mg/dL (ref 8.9–10.3)
Chloride: 105 mmol/L (ref 98–111)
Creatinine, Ser: 0.85 mg/dL (ref 0.61–1.24)
GFR, Estimated: >60 mL/min (ref >60)
Glucose, Bld: 124 mg/dL — ABNORMAL HIGH (ref 70–99)
Potassium: 3.7 mmol/L (ref 3.5–5.1)
Sodium: 138 mmol/L (ref 135–145)
Total Bilirubin: 0.6 mg/dL (ref 0.3–1.2)
Total Protein: 7 g/dL (ref 6.5–8.1)

## 2020-11-15 LAB — CBC WITH DIFFERENTIAL/PLATELET
Abs Immature Granulocytes: 0.01 10*3/uL (ref 0.00–0.07)
Basophils Absolute: 0 10*3/uL (ref 0.0–0.1)
Basophils Relative: 0 %
Eosinophils Absolute: 0.2 10*3/uL (ref 0.0–0.5)
Eosinophils Relative: 3 %
HCT: 32.8 % — ABNORMAL LOW (ref 39.0–52.0)
Hemoglobin: 10.9 g/dL — ABNORMAL LOW (ref 13.0–17.0)
Immature Granulocytes: 0 %
Lymphocytes Relative: 14 %
Lymphs Abs: 0.9 10*3/uL (ref 0.7–4.0)
MCH: 32.3 pg (ref 26.0–34.0)
MCHC: 33.2 g/dL (ref 30.0–36.0)
MCV: 97.3 fL (ref 80.0–100.0)
Monocytes Absolute: 0.6 10*3/uL (ref 0.1–1.0)
Monocytes Relative: 9 %
Neutro Abs: 4.4 10*3/uL (ref 1.7–7.7)
Neutrophils Relative %: 74 %
Platelets: 190 10*3/uL (ref 150–400)
RBC: 3.37 MIL/uL — ABNORMAL LOW (ref 4.22–5.81)
RDW: 12.7 % (ref 11.5–15.5)
WBC: 6 10*3/uL (ref 4.0–10.5)
nRBC: 0 % (ref 0.0–0.2)

## 2020-11-15 MED ORDER — HEPARIN SOD (PORK) LOCK FLUSH 100 UNIT/ML IV SOLN
500.0000 [IU] | Freq: Once | INTRAVENOUS | Status: DC
  Filled 2020-11-15: qty 5

## 2020-11-15 MED ORDER — HEPARIN SOD (PORK) LOCK FLUSH 100 UNIT/ML IV SOLN
500.0000 [IU] | Freq: Once | INTRAVENOUS | Status: AC | PRN
  Administered 2020-11-15: 500 [IU]
  Filled 2020-11-15: qty 5

## 2020-11-15 MED ORDER — SODIUM CHLORIDE 0.9 % IV SOLN
200.0000 mg | Freq: Once | INTRAVENOUS | Status: AC
  Administered 2020-11-15: 200 mg via INTRAVENOUS
  Filled 2020-11-15: qty 8

## 2020-11-15 MED ORDER — HEPARIN SOD (PORK) LOCK FLUSH 100 UNIT/ML IV SOLN
INTRAVENOUS | Status: AC
  Filled 2020-11-15: qty 5

## 2020-11-15 MED ORDER — SODIUM CHLORIDE 0.9% FLUSH
10.0000 mL | Freq: Once | INTRAVENOUS | Status: AC
  Administered 2020-11-15: 10 mL via INTRAVENOUS
  Filled 2020-11-15: qty 10

## 2020-11-15 MED ORDER — SODIUM CHLORIDE 0.9 % IV SOLN
Freq: Once | INTRAVENOUS | Status: AC
  Filled 2020-11-15: qty 250

## 2020-11-15 NOTE — Progress Notes (Signed)
Patient here for oncology follow-up appointment,  concerns of hoarseness

## 2020-11-15 NOTE — Patient Instructions (Signed)
[facility]  Discharge Instructions: Thank you for choosing [facility] to provide your oncology and hematology care.  If you have a lab appointment with the [facility], please go directly to the [facility] and check in at the registration area.  Wear comfortable clothing and clothing appropriate for easy access to any Portacath or PICC line.   We strive to give you quality time with your provider. You may need to reschedule your appointment if you arrive late (15 or more minutes).  Arriving late affects you and other patients whose appointments are after yours.  Also, if you miss three or more appointments without notifying the office, you may be dismissed from the clinic at the provider's discretion.      For prescription refill requests, have your pharmacy contact our office and allow 72 hours for refills to be completed.    Today you received the following chemotherapy and/or immunotherapy agents Keytruda       To help prevent nausea and vomiting after your treatment, we encourage you to take your nausea medication as directed.  BELOW ARE SYMPTOMS THAT SHOULD BE REPORTED IMMEDIATELY: *FEVER GREATER THAN 100.4 F (38 C) OR HIGHER *CHILLS OR SWEATING *NAUSEA AND VOMITING THAT IS NOT CONTROLLED WITH YOUR NAUSEA MEDICATION *UNUSUAL SHORTNESS OF BREATH *UNUSUAL BRUISING OR BLEEDING *URINARY PROBLEMS (pain or burning when urinating, or frequent urination) *BOWEL PROBLEMS (unusual diarrhea, constipation, pain near the anus) TENDERNESS IN MOUTH AND THROAT WITH OR WITHOUT PRESENCE OF ULCERS (sore throat, sores in mouth, or a toothache) UNUSUAL RASH, SWELLING OR PAIN  UNUSUAL VAGINAL DISCHARGE OR ITCHING   Items with * indicate a potential emergency and should be followed up as soon as possible or go to the Emergency Department if any problems should occur.  Please show the CHEMOTHERAPY ALERT CARD or IMMUNOTHERAPY ALERT CARD at check-in  to the Emergency Department and triage nurse.  Should you have questions after your visit or need to cancel or reschedule your appointment, please contact [facility]  [phone] and follow the prompts.  Office hours are 8:00 a.m. to 4:30 p.m. Monday - Friday. Please note that voicemails left after 4:00 p.m. may not be returned until the following business day.  We are closed weekends and major holidays. You have access to a nurse at all times for urgent questions. Please call the main number to the clinic [phone] and follow the prompts.  For any non-urgent questions, you may also contact your provider using MyChart. We now offer e-Visits for anyone 80 and older to request care online for non-urgent symptoms. For details visit [email or web address].   Also download the MyChart app! Go to the app store, search "MyChart", open the app, select [facility], and log in with your MyChart username and password.  Due to Covid, a mask is required upon entering the hospital/clinic. If you do not have a mask, one will be given to you upon arrival. For doctor visits, patients may have 1 support person aged 11 or older with them. For treatment visits, patients cannot have anyone with them due to current Covid guidelines and our immunocompromised population. Pembrolizumab injection What is this medication? PEMBROLIZUMAB (pem broe liz ue mab) is a monoclonal antibody. It is used to treat certain types of cancer. This medicine may be used for other purposes; ask your health care provider or pharmacist if you have questions. COMMON BRAND NAME(S): Keytruda What should I tell my care team before  I take this medication? They need to know if you have any of these conditions: autoimmune diseases like Crohn's disease, ulcerative colitis, or lupus have had or planning to have an allogeneic stem cell transplant (uses someone else's stem cells) history of organ transplant history  of chest radiation nervous system problems like myasthenia gravis or Guillain-Barre syndrome an unusual or allergic reaction to pembrolizumab, other medicines, foods, dyes, or preservatives pregnant or trying to get pregnant breast-feeding How should I use this medication? This medicine is for infusion into a vein. It is given by a health care professional in a hospital or clinic setting. A special MedGuide will be given to you before each treatment. Be sure to read this information carefully each time. Talk to your pediatrician regarding the use of this medicine in children. While this drug may be prescribed for children as young as 6 months for selected conditions, precautions do apply. Overdosage: If you think you have taken too much of this medicine contact a poison control center or emergency room at once. NOTE: This medicine is only for you. Do not share this medicine with others. What if I miss a dose? It is important not to miss your dose. Call your doctor or health care professional if you are unable to keep an appointment. What may interact with this medication? Interactions have not been studied. This list may not describe all possible interactions. Give your health care provider a list of all the medicines, herbs, non-prescription drugs, or dietary supplements you use. Also tell them if you smoke, drink alcohol, or use illegal drugs. Some items may interact with your medicine. What should I watch for while using this medication? Your condition will be monitored carefully while you are receiving this medicine. You may need blood work done while you are taking this medicine. Do not become pregnant while taking this medicine or for 4 months after stopping it. Women should inform their doctor if they wish to become pregnant or think they might be pregnant. There is a potential for serious side effects to an unborn child. Talk to your health care professional or pharmacist for more  information. Do not breast-feed an infant while taking this medicine or for 4 months after the last dose. What side effects may I notice from receiving this medication? Side effects that you should report to your doctor or health care professional as soon as possible: allergic reactions like skin rash, itching or hives, swelling of the face, lips, or tongue bloody or black, tarry breathing problems changes in vision chest pain chills confusion constipation cough diarrhea dizziness or feeling faint or lightheaded fast or irregular heartbeat fever flushing joint pain low blood counts - this medicine may decrease the number of white blood cells, red blood cells and platelets. You may be at increased risk for infections and bleeding. muscle pain muscle weakness pain, tingling, numbness in the hands or feet persistent headache redness, blistering, peeling or loosening of the skin, including inside the mouth signs and symptoms of high blood sugar such as dizziness; dry mouth; dry skin; fruity breath; nausea; stomach pain; increased hunger or thirst; increased urination signs and symptoms of kidney injury like trouble passing urine or change in the amount of urine signs and symptoms of liver injury like dark urine, light-colored stools, loss of appetite, nausea, right upper belly pain, yellowing of the eyes or skin sweating swollen lymph nodes weight loss Side effects that usually do not require medical attention (report to your doctor or health  care professional if they continue or are bothersome): decreased appetite hair loss tiredness This list may not describe all possible side effects. Call your doctor for medical advice about side effects. You may report side effects to FDA at 1-800-FDA-1088. Where should I keep my medication? This drug is given in a hospital or clinic and will not be stored at home. NOTE: This sheet is a summary. It may not cover all possible information. If you  have questions about this medicine, talk to your doctor, pharmacist, or health care provider.  2022 Elsevier/Gold Standard (2019-01-13 21:44:53)

## 2020-11-15 NOTE — Progress Notes (Signed)
Hematology/Oncology progress note [facility] Telephone:([phone] [phone]   Patient Care Team: Casilda Carls, MD as PCP - General (Internal Medicine) Earlie Server, [name], [name]  HISTORY OF PRESENTING ILLNESS:  Alcoholism History of prostate cancer. S/p Radiation/seed, he follows up with [facility].   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  PD-L1-IHC 40%, no targetable mutations. # 09/07/2019 chemotherapy [cisplatin] and radiation.   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  Interval decrease of the posterior left upper lobe pleural-based nodule with interval decrease in size of the tiny adjacent satellite nodule.  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  No recurrent tumor in right piriform sinus.  Right lateral pharyngeal lymph node is stable.  No new or recurrent adenopathy.Suspect metastasis.  I recommend patient to obtain PET scan restaging.  I discussed about the plan of re-biopsy of either the paraspinal soft tissue mass versus other more feasible hypermetabolic sites detected on PET scan. Patient was seen by Dr. Kennyth Lose and opted to proceed with palliative radiation as soon as possible.  PET scan cannot be arranged prior to the radiation so the PET scan was canceled. 04/20/2020 -04/25/2020, palliative  radiation to thoracic spine  05/10/2020, MRI thoracic spine was obtained for worsening of back pain and left shoulder pain.  Images showed metastatic disease throughout almost the entire T2 with and associated mild superior endplate compression fracture.  Abnormal signal in the anterior, inferior endplate of T1, superior aspect of the T3.  Small metastatic deposit in T12 is also noted. Case was also discussed on multidisciplinary tumor board on 05/25/2020 05/22/2020, PET scan showed new hypermetabolic lytic bone metastasis in the left scapular near the glenoid and in the T2 vertebral body.  Persistent hypermetabolic right lateral retropharyngeal and right level 3 neck lymph node metastasis.  Peripheral left upper lobe pulmonary nodule is mildly decreased with new surrounding mild platelike postradiation changes..  No residual recurrent neoplasm at the site of vomiting in the right piriform sinus  06/07/2020 -06/09/2020 patient underwent palliative radiation to left scapular April 2022 seen by [name] and flexible laryngoscope examination showed new lesion at the right false vocal cord.  Case was discussed on tumor board on 06/22/2020. [name] recommend additional CT soft tissue neck with contrast for additional evaluation. 06/29/2020 CT images were reviewed and discussed with patient. There is new malignant lymph node in the right mid neck at the level of the hyoid, this compressing the right jugular vein with thrombus in the vein above and below the lymph node.  Cystic retropharyngeal lymph node on the right is stable.  Progressive soft tissue thickening in the larynx bilaterally suspicious for tumor.  Metastatic disease at the T2 is unchanged.  Metastatic deposit left scapula there is now a pathological fracture of the scapular. CT scan findings were discussed with ENT [name] via secure chat.  [name] feels surgery is  not feasible.  The larynx lesion currently does not compromise his airway  however further progression may in the future  07/10/2020 Seen by Radonc [name] who would offer salvage radiation to his larynx as well as neck adenopathy. Would like low dose chemotherapy as sensitizer.    Patient was also referred to establish care with Dr. Georgiann Cocker at [facility] for kyphoplasty.  Patient's wife reports that they were never contacted and she called Dr. Georgiann Cocker office with no success.  # NGS.  PD-L1-IHC 40%, no targetable mutations # 07/25/20- 07/27/2020, 08/01/20- 09/06/2020  concurrent chemoradiation [low-dose weekly cisplatin $RemoveBeforeDE'30mg'[id number]$ /m2 ] to the larynx  09/15/2020, patient was seen by ENT [name].  Laryngoscope examination reviewed clear larynx, no lesion was discovered. 10/04/2020, started on maintenance Keytruda.  INTERVAL HISTORY Gregory [facility] is a 80 y.o. male who has above history reviewed by me today presents for follow-up of head and neck cancer and lung cancer Patient was accompanied by wife.  He tolerates Keytruda.  No nausea vomiting diarrhea.  He has gained weight.  Appetite is fair. Voice is chronically hoarse.   Review of Systems  Constitutional:  Positive for fatigue. Negative for appetite change, chills, fever and unexpected weight change.  HENT:   Positive for hearing loss and voice change.   Eyes:  Negative for eye problems and icterus.  Respiratory:  Negative for chest tightness, cough and shortness of breath.   Cardiovascular:  Negative for chest pain and leg swelling.  Gastrointestinal:  Negative for abdominal distention, abdominal pain, blood in stool and nausea.  Endocrine: Negative for hot flashes.  Genitourinary:  Negative for difficulty urinating, dysuria and frequency.   Musculoskeletal:  Positive for arthralgias.  Skin:  Negative for itching and rash.  Neurological:  Negative for extremity weakness, light-headedness and numbness.  Hematological:  Negative for adenopathy. Does not bruise/bleed easily.  Psychiatric/Behavioral:  Negative for  confusion.        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   Prostate CA ([place]) 2008   Squamous cell lung cancer ([place]) 06/23/2019    SURGICAL HISTORY: Past Surgical History:  Procedure Laterality Date   BRAIN SURGERY  2012   HERNIA REPAIR     PORTA CATH INSERTION N/A 07/27/2019   Procedure: PORTA CATH INSERTION;  Surgeon: Katha Cabal, MD;  Location: [facility];  Service: Cardiovascular;  Laterality: N/A;    SOCIAL HISTORY: Social History   Socioeconomic History   Marital status: Married    Spouse name: Not on file   Number of children: Not on file   Years of education: Not on file   Highest education level: Not on file  Occupational History   Not on file  Tobacco Use   Smoking status: Former    Years: 21.00    Types: Cigarettes    Quit date: 05/11/2008    Years since quitting: 12.5   Smokeless tobacco: Never  Vaping Use   Vaping Use: Never used  Substance and Sexual Activity   Alcohol use: Yes    Comment: 0.5 pint liquor a week   Drug use: Never   Sexual activity: Not Currently  Other Topics Concern   Not on  file  Social History Narrative   Lives at home with wife. Wife states he has some dementia but is able to sign his own consent.   Social Determinants of Health   Financial Resource Strain: Not on file  Food Insecurity: Not on file  Transportation Needs: Not on file  Physical Activity: Not on file  Stress: Not on file  Social Connections: Not on file  Intimate Partner Violence: Not on file    FAMILY HISTORY: History reviewed. No pertinent family history.  ALLERGIES:  is allergic to enalapril.  MEDICATIONS:  Current Outpatient Medications  Medication Sig Dispense Refill    atorvastatin (LIPITOR) 20 MG tablet Take 20 mg by mouth daily.     donepezil (ARICEPT) 10 MG tablet Take 10 mg by mouth at bedtime.     dorzolamide-timolol (COSOPT) 22.3-6.8 MG/ML ophthalmic solution Place 1 drop into both eyes 2 (two) times daily.      ELIQUIS 2.5 MG TABS tablet TAKE 1 TABLET BY MOUTH TWICE A DAY 60 tablet 1   folic acid (FOLVITE) 1 MG tablet Take 1 mg by mouth daily.     latanoprost (XALATAN) 0.005 % ophthalmic solution Place 1 drop into both eyes at bedtime.      lidocaine-prilocaine (EMLA) cream Apply 1 application topically as needed. Apply small amount to port site approx 1-2 hours prior to appointment. 30 g 1   MAG64 64 MG TBEC TAKE 1 TABLET (64 MG TOTAL) BY MOUTH 2 (TWO) TIMES DAILY. 180 tablet 0   megestrol (MEGACE) 40 MG tablet TAKE 1 TABLET BY MOUTH TWICE A DAY 60 tablet 0   Multiple Vitamins-Minerals (CENTRUM ADULTS PO) Take by mouth.     nystatin (MYCOSTATIN) 100000 UNIT/ML suspension TAKE 5 MLS (500,000 UNITS TOTAL) BY MOUTH 4 (FOUR) TIMES DAILY. 480 mL 1   Thiamine HCl (VITAMIN B-1 PO) Take 100 mg by mouth daily.     magic mouthwash w/lidocaine SOLN Take 5 mLs by mouth 3 (three) times daily as needed for mouth pain. (Patient not taking: No sig reported) 5 mL 5   oxyCODONE (OXY IR/ROXICODONE) 5 MG immediate release tablet Take 1 tablet (5 mg total) by mouth every 4 (four) hours as needed for severe pain. (Patient not taking: No sig reported) 90 tablet 0   No current facility-administered medications for this visit.   Facility-Administered Medications Ordered in Other Visits  Medication Dose Route Frequency Provider Last Rate Last Admin   sodium chloride flush (NS) 0.9 % injection 10 mL  10 mL Intravenous PRN Earlie Server, MD   10 mL at 03/01/20 0812     PHYSICAL EXAMINATION: ECOG PERFORMANCE STATUS: 1 - Symptomatic but completely ambulatory Vitals:   11/15/20 0908  BP: (!) 143/90  Pulse: 72  Resp: 16  Temp: (!) 96.5 F (35.8 C)  SpO2: 100%   Filed Weights    11/15/20 0908  Weight: 107 lb (48.5 kg)    Physical Exam Constitutional:      General: He is not in acute distress. HENT:     Head: Normocephalic and atraumatic.  Eyes:     General: No scleral icterus. Cardiovascular:     Rate and Rhythm: Normal rate and regular rhythm.     Heart sounds: Normal heart sounds.  Pulmonary:     Effort: Pulmonary effort is normal. No respiratory distress.     Breath sounds: No wheezing.  Abdominal:     General: Bowel sounds are normal. There is no distension.     Palpations: Abdomen  is soft.  Musculoskeletal:        General: No deformity.     Cervical back: Normal range of motion and neck supple.  Lymphadenopathy:     Cervical: No cervical adenopathy.  Skin:    General: Skin is warm and dry.     Findings: No erythema or rash.  Neurological:     Mental Status: He is alert. Mental status is at baseline.     Cranial Nerves: No cranial nerve deficit.     Coordination: Coordination normal.    LABORATORY DATA:  I have reviewed the data as listed Lab Results  Component Value Date   WBC 6.0 11/15/2020   HGB 10.9 (L) 11/15/2020   HCT 32.8 (L) 11/15/2020   MCV 97.3 11/15/2020   PLT 190 11/15/2020   Recent Labs    10/13/20 0927 10/25/20 0818 11/15/20 0752  NA 139 138 138  K 3.9 4.0 3.7  CL 107 107 105  CO2 _0 GLUCOSE 120* 108* 124*  BUN _1 CREATININE 0.73 0.83 0.85  CALCIUM 9.1 9.5 9.5  GFRNONAA >60 >60 >60  PROT 6.7 7.3 7.0  ALBUMIN 3.5 3.9 4.0  AST _2 ALT _3 ALKPHOS 48 54 53  BILITOT 1.0 0.9 0.6    Iron/TIBC/Ferritin/ %Sat No results found for: IRON, TIBC, FERRITIN, IRONPCTSAT    RADIOGRAPHIC STUDIES: I have personally reviewed the radiological images as listed and agreed with the findings in the report. No results found.  ASSESSMENT & PLAN:  1. Squamous cell carcinoma of left lung ([place])   2. 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 Encounter for antineoplastic immunotherapy    Cancer Staging Larynx cancer [facility]) Staging form: Larynx - Glottis, AJCC 8th Edition - Clinical: Stage IVC (cT1, cNX, cM1) - Signed by Earlie Server, MD on 07/26/2020  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[facility]) Staging form: Pharynx - P16 Negative Oropharynx, AJCC 8th Edition - Clinical stage from 06/23/2019: Stage IVB (cT4b, cN2b, cM0, p16-) - Signed by Earlie Server, MD on 06/23/2019  Squamous cell lung cancer ([place]) Staging form: Lung, AJCC 8th Edition - Clinical: cT1, cN0, cM0 - Signed by Earlie Server, MD on 07/19/2019   #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 Proceed with Keytruda.  Plan to obtain CT neck/chest/abdomen/pelvis with contrast in October 2022.  Ordered  #Hypomagnesia, continue oral Slow-Mag, continue 1 tablet twice daily.    #Left upper lobe squamous cell carcinoma, stage I Finished SBRT on 02/07/2020.  Monitor disease status.  #Right jugular vein thrombus, continue Eliquis 2.5 mg twice daily.  We will decide duration of anticoagulation after reimaging.  #Weight loss, weight has been stable.  Continue nutrition supplementation.    all questions were answered. The patient knows to call the clinic with any problems questions or concerns.   Return of visit: lab/MD/Keytruda in 3 weeks  ZEarlie Server MD, PhD Hematology Oncology COur Lady Of Lourdes Memorial Hospitalat AArtel LLC Dba Lodi Outpatient Surgical CenterPager- [phone]/21/2022

## 2020-11-15 NOTE — Progress Notes (Signed)
Nutrition Follow-up:  Patient with metastatic head and neck cancer.  Patient has completed concurrent chemotherapy and radiation.  Patient on keytruda  Met with wife today as patient with memory issues.  Wife reports that patient has not had any nutrition impact symptoms from keytruda.  Patient drinking 3 ensure per day.  He is eating 3 meals per day that wife prepares (eggs, cheese toast, liver pudding, applesauce, juice for breakfast).  Wife has also been preparing a milkshake with banana, milk and ice cream that patient enjoys.  Likes peanut butter and jelly, beef stew and mashed potatoes and gravy    Medications: reviewed  Labs: reviewed  Anthropometrics:   Weight 107 lb  104 lb on 8/10 104 lb on 7/13 107 lb on 6/8 109 lb on 6/1 112 lb on 4/1   NUTRITION DIAGNOSIS: Inadequate oral intake stable   INTERVENTION:  Complimentary case of ensure plus given to wife today Patient to continue ensure plus TID and high calorie high protein foods    MONITORING, EVALUATION, GOAL: weight trends, intake   NEXT VISIT: Wednesday, Oct 12 during infusion   B. , RD, LDN Registered Dietitian [phone] (mobile)   

## 2020-11-20 ENCOUNTER — Telehealth: Payer: Self-pay | Admitting: *Deleted

## 2020-11-20 NOTE — Telephone Encounter (Signed)
Patient wife called reporting that his right hand is hurting and that it is swollen. Asking for a return call to let her know what she can do for it. Please advise.

## 2020-11-20 NOTE — Telephone Encounter (Signed)
Wife said she will contact Dr Rosario Jacks about this

## 2020-11-30 ENCOUNTER — Other Ambulatory Visit: Payer: Self-pay | Admitting: Oncology

## 2020-12-04 ENCOUNTER — Other Ambulatory Visit: Payer: Self-pay | Admitting: Oncology

## 2020-12-05 ENCOUNTER — Telehealth: Payer: Self-pay

## 2020-12-05 NOTE — Telephone Encounter (Signed)
PA for Eliquis 2.5 mg tabs submitted via [email or web address] and has been approved from 11/05/2020 -06/03/2021.   Key : [id number] PA case ID: [id number]

## 2020-12-06 ENCOUNTER — Inpatient Hospital Stay: Payer: Medicare Other | Attending: Oncology

## 2020-12-06 ENCOUNTER — Inpatient Hospital Stay (HOSPITAL_BASED_OUTPATIENT_CLINIC_OR_DEPARTMENT_OTHER): Payer: Medicare Other | Admitting: Oncology

## 2020-12-06 ENCOUNTER — Encounter: Payer: Self-pay | Admitting: Oncology

## 2020-12-06 ENCOUNTER — Inpatient Hospital Stay: Payer: Medicare Other

## 2020-12-06 ENCOUNTER — Other Ambulatory Visit: Payer: Self-pay

## 2020-12-06 VITALS — BP 136/86 | HR 77 | Temp 98.2°F | Wt 107.5 lb

## 2020-12-06 DIAGNOSIS — Z79899 Other long term (current) drug therapy: Secondary | ICD-10-CM | POA: Diagnosis not present

## 2020-12-06 DIAGNOSIS — C139 Malignant neoplasm of hypopharynx, unspecified: Secondary | ICD-10-CM | POA: Insufficient documentation

## 2020-12-06 DIAGNOSIS — C329 Malignant neoplasm of larynx, unspecified: Secondary | ICD-10-CM

## 2020-12-06 DIAGNOSIS — C029 Malignant neoplasm of tongue, unspecified: Secondary | ICD-10-CM | POA: Diagnosis present

## 2020-12-06 DIAGNOSIS — C3492 Malignant neoplasm of unspecified part of left bronchus or lung: Secondary | ICD-10-CM | POA: Diagnosis not present

## 2020-12-06 DIAGNOSIS — C109 Malignant neoplasm of oropharynx, unspecified: Secondary | ICD-10-CM

## 2020-12-06 DIAGNOSIS — Z1329 Encounter for screening for other suspected endocrine disorder: Secondary | ICD-10-CM

## 2020-12-06 DIAGNOSIS — Z5112 Encounter for antineoplastic immunotherapy: Secondary | ICD-10-CM

## 2020-12-06 DIAGNOSIS — C7951 Secondary malignant neoplasm of bone: Secondary | ICD-10-CM | POA: Insufficient documentation

## 2020-12-06 LAB — CBC WITH DIFFERENTIAL/PLATELET
Abs Immature Granulocytes: 0.02 10*3/uL (ref 0.00–0.07)
Basophils Absolute: 0 10*3/uL (ref 0.0–0.1)
Basophils Relative: 0 %
Eosinophils Absolute: 0.1 10*3/uL (ref 0.0–0.5)
Eosinophils Relative: 2 %
HCT: 35.3 % — ABNORMAL LOW (ref 39.0–52.0)
Hemoglobin: 11.4 g/dL — ABNORMAL LOW (ref 13.0–17.0)
Immature Granulocytes: 0 %
Lymphocytes Relative: 12 %
Lymphs Abs: 0.7 10*3/uL (ref 0.7–4.0)
MCH: 31.1 pg (ref 26.0–34.0)
MCHC: 32.3 g/dL (ref 30.0–36.0)
MCV: 96.2 fL (ref 80.0–100.0)
Monocytes Absolute: 0.7 10*3/uL (ref 0.1–1.0)
Monocytes Relative: 11 %
Neutro Abs: 4.5 10*3/uL (ref 1.7–7.7)
Neutrophils Relative %: 75 %
Platelets: 212 10*3/uL (ref 150–400)
RBC: 3.67 MIL/uL — ABNORMAL LOW (ref 4.22–5.81)
RDW: 12 % (ref 11.5–15.5)
WBC: 6.1 10*3/uL (ref 4.0–10.5)
nRBC: 0 % (ref 0.0–0.2)

## 2020-12-06 LAB — COMPREHENSIVE METABOLIC PANEL
ALT: 14 U/L (ref 0–44)
AST: 18 U/L (ref 15–41)
Albumin: 3.7 g/dL (ref 3.5–5.0)
Alkaline Phosphatase: 56 U/L (ref 38–126)
Anion gap: 7 (ref 5–15)
BUN: 16 mg/dL (ref 8–23)
CO2: 25 mmol/L (ref 22–32)
Calcium: 9.1 mg/dL (ref 8.9–10.3)
Chloride: 104 mmol/L (ref 98–111)
Creatinine, Ser: 0.79 mg/dL (ref 0.61–1.24)
GFR, Estimated: >60 mL/min (ref >60)
Glucose, Bld: 100 mg/dL — ABNORMAL HIGH (ref 70–99)
Potassium: 3.7 mmol/L (ref 3.5–5.1)
Sodium: 136 mmol/L (ref 135–145)
Total Bilirubin: 0.3 mg/dL (ref 0.3–1.2)
Total Protein: 6.8 g/dL (ref 6.5–8.1)

## 2020-12-06 LAB — T4, FREE: Free T4: 1.08 ng/dL (ref 0.61–1.12)

## 2020-12-06 LAB — TSH: TSH: 1.937 u[IU]/mL (ref 0.350–4.500)

## 2020-12-06 MED ORDER — SODIUM CHLORIDE 0.9 % IV SOLN
Freq: Once | INTRAVENOUS | Status: AC
  Filled 2020-12-06: qty 250

## 2020-12-06 MED ORDER — SODIUM CHLORIDE 0.9% FLUSH
10.0000 mL | INTRAVENOUS | Status: DC | PRN
  Administered 2020-12-06: 10 mL via INTRAVENOUS
  Filled 2020-12-06: qty 10

## 2020-12-06 MED ORDER — HEPARIN SOD (PORK) LOCK FLUSH 100 UNIT/ML IV SOLN
500.0000 [IU] | Freq: Once | INTRAVENOUS | Status: DC
  Filled 2020-12-06: qty 5

## 2020-12-06 MED ORDER — HEPARIN SOD (PORK) LOCK FLUSH 100 UNIT/ML IV SOLN
INTRAVENOUS | Status: AC
  Administered 2020-12-06: 500 [IU]
  Filled 2020-12-06: qty 5

## 2020-12-06 MED ORDER — SODIUM CHLORIDE 0.9 % IV SOLN
200.0000 mg | Freq: Once | INTRAVENOUS | Status: AC
  Administered 2020-12-06: 200 mg via INTRAVENOUS
  Filled 2020-12-06: qty 8

## 2020-12-06 NOTE — Patient Instructions (Signed)
[facility]  Discharge Instructions: Thank you for choosing [facility] to provide your oncology and hematology care.  If you have a lab appointment with the [facility], please go directly to the [facility] and check in at the registration area.  Wear comfortable clothing and clothing appropriate for easy access to any Portacath or PICC line.   We strive to give you quality time with your provider. You may need to reschedule your appointment if you arrive late (15 or more minutes).  Arriving late affects you and other patients whose appointments are after yours.  Also, if you miss three or more appointments without notifying the office, you may be dismissed from the clinic at the provider's discretion.      For prescription refill requests, have your pharmacy contact our office and allow 72 hours for refills to be completed.    Today you received the following chemotherapy and/or immunotherapy agents KEYTRUDA       To help prevent nausea and vomiting after your treatment, we encourage you to take your nausea medication as directed.  BELOW ARE SYMPTOMS THAT SHOULD BE REPORTED IMMEDIATELY: *FEVER GREATER THAN 100.4 F (38 C) OR HIGHER *CHILLS OR SWEATING *NAUSEA AND VOMITING THAT IS NOT CONTROLLED WITH YOUR NAUSEA MEDICATION *UNUSUAL SHORTNESS OF BREATH *UNUSUAL BRUISING OR BLEEDING *URINARY PROBLEMS (pain or burning when urinating, or frequent urination) *BOWEL PROBLEMS (unusual diarrhea, constipation, pain near the anus) TENDERNESS IN MOUTH AND THROAT WITH OR WITHOUT PRESENCE OF ULCERS (sore throat, sores in mouth, or a toothache) UNUSUAL RASH, SWELLING OR PAIN  UNUSUAL VAGINAL DISCHARGE OR ITCHING   Items with * indicate a potential emergency and should be followed up as soon as possible or go to the Emergency Department if any problems should occur.  Please show the CHEMOTHERAPY ALERT CARD or IMMUNOTHERAPY ALERT CARD at check-in  to the Emergency Department and triage nurse.  Should you have questions after your visit or need to cancel or reschedule your appointment, please contact [facility]  [phone] and follow the prompts.  Office hours are 8:00 a.m. to 4:30 p.m. Monday - Friday. Please note that voicemails left after 4:00 p.m. may not be returned until the following business day.  We are closed weekends and major holidays. You have access to a nurse at all times for urgent questions. Please call the main number to the clinic [phone] and follow the prompts.  For any non-urgent questions, you may also contact your provider using MyChart. We now offer e-Visits for anyone 4 and older to request care online for non-urgent symptoms. For details visit [email or web address].   Also download the MyChart app! Go to the app store, search "MyChart", open the app, select [facility], and log in with your MyChart username and password.  Due to Covid, a mask is required upon entering the hospital/clinic. If you do not have a mask, one will be given to you upon arrival. For doctor visits, patients may have 1 support person aged 31 or older with them. For treatment visits, patients cannot have anyone with them due to current Covid guidelines and our immunocompromised population.

## 2020-12-06 NOTE — Progress Notes (Signed)
Hematology/Oncology progress note [facility] Telephone:([phone] [phone]   [name], [name], [name], [name]  HISTORY OF PRESENTING ILLNESS:  History of prostate cancer. S/p Radiation/seed, he follows up with [facility].  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  PD-L1-IHC 40%, no targetable mutations. # 09/07/2019 chemotherapy [cisplatin] and radiation.   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  Interval decrease of the posterior left upper lobe pleural-based nodule with interval decrease in size of the tiny adjacent satellite nodule.  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  No recurrent tumor in right piriform sinus.  Right lateral pharyngeal lymph node is stable.  No new or recurrent adenopathy.Suspect metastasis.  I recommend patient to obtain PET scan restaging.  I discussed about the plan of re-biopsy of either the paraspinal soft tissue mass versus other more feasible hypermetabolic sites detected on PET scan. Patient was seen by Dr. Kennyth Burton and opted to proceed with palliative radiation as soon as possible.  PET scan cannot be arranged prior to the radiation so the PET scan was canceled. 04/20/2020 -04/25/2020, palliative radiation to thoracic  spine  05/10/2020, MRI thoracic spine was obtained for worsening of back pain and left shoulder pain.  Images showed metastatic disease throughout almost the entire T2 with and associated mild superior endplate compression fracture.  Abnormal signal in the anterior, inferior endplate of T1, superior aspect of the T3.  Small metastatic deposit in T12 is also noted. Case was also discussed on multidisciplinary tumor board on 05/25/2020 05/22/2020, PET scan showed new hypermetabolic lytic bone metastasis in the left scapular near the glenoid and in the T2 vertebral body.  Persistent hypermetabolic right lateral retropharyngeal and right level 3 neck lymph node metastasis.  Peripheral left upper lobe pulmonary nodule is mildly decreased with new surrounding mild platelike postradiation changes..  No residual recurrent neoplasm at the site of vomiting in the right piriform sinus  06/07/2020 -06/09/2020 patient underwent palliative radiation to left scapular April 2022 seen by Dr. Tami [facility] and flexible laryngoscope examination showed new lesion at the right false vocal cord.  Case was discussed on tumor board on 06/22/2020. Dr. Tami Burton recommend additional CT soft tissue neck with contrast for additional evaluation. 06/29/2020 CT images were reviewed and discussed with patient. There is new malignant lymph node in the right mid neck at the level of the hyoid, this compressing the right jugular vein with thrombus in the vein above and below the lymph node.  Cystic retropharyngeal lymph node on the right is stable.  Progressive soft tissue thickening in the larynx bilaterally suspicious for tumor.  Metastatic disease at the T2 is unchanged.  Metastatic deposit left scapula there is now a pathological fracture of the scapular. CT scan findings were discussed with ENT Dr. Tami [facility] via secure chat.  Dr. Tami Burton feels surgery is not feasible.  The larynx lesion currently does not compromise his airway however further  progression may in the future  07/10/2020 Seen by Radonc Gregory Burton who would offer salvage radiation to his larynx as well as neck adenopathy. Would like low dose chemotherapy as sensitizer.    Patient was also referred to establish care with Gregory Burton for kyphoplasty.  Patient's wife reports that they were never contacted and she called Gregory Burton office with no success.  # NGS.  PD-L1-IHC 40%, no targetable mutations # 07/25/20- 07/27/2020, 08/01/20- 09/06/2020  concurrent chemoradiation [low-dose weekly cisplatin 56m/m2 ] to the larynx  09/15/2020, patient was seen by ENT Gregory Burton  Laryngoscope examination reviewed clear larynx, no lesion was discovered. 10/04/2020, started on maintenance Keytruda.  [name] Patient was accompanied by wife.  He tolerates Keytruda.   No nausea vomiting diarrhea.  Patient has restaging CT scheduled.  He also has an appointment with ENT Gregory Burton  No new complaints.  Voice is hoarse.    Review of Systems  Constitutional:  Positive for fatigue. Negative for appetite change, chills, fever and unexpected weight change.  HENT:   Positive for hearing loss and voice change.   Eyes:  Negative for eye problems and icterus.  Respiratory:  Negative for chest tightness, cough and shortness of breath.   Cardiovascular:  Negative for chest pain and leg swelling.  Gastrointestinal:  Negative for abdominal distention, abdominal pain, blood in stool and nausea.  Endocrine: Negative for hot flashes.  Genitourinary:  Negative for difficulty urinating, dysuria and frequency.   Musculoskeletal:  Positive for arthralgias.  Skin:  Negative for itching and rash.  Neurological:  Negative for extremity weakness, light-headedness and numbness.  Hematological:  Negative for adenopathy. Does not bruise/bleed easily.   Psychiatric/Behavioral:  Negative for confusion.        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   Prostate CA ([facility] 2008   Squamous cell lung cancer ([facility] 06/23/2019    SURGICAL HISTORY: Past Surgical History:  Procedure Laterality Date   BRAIN SURGERY  2012   HERNIA REPAIR     PORTA CATH INSERTION N/A 07/27/2019   Procedure: PORTA CATH INSERTION;  Surgeon: SKatha Cabal MD;  [facility];  Service: Cardiovascular;  Laterality: N/A;    SOCIAL HISTORY: Social History   Socioeconomic History   Marital status: Married    Spouse name: Not on file   Number of children: Not on file   Years of education: Not on file   Highest education level: Not on file  Occupational History   Not on file  Tobacco Use   Smoking status: Former    Years: 21.00    Types: Cigarettes    Quit date: 05/11/2008    Years since quitting: 12.5   Smokeless tobacco: Never  Vaping Use   Vaping Use: Never used  Substance and Sexual Activity   Alcohol use: Yes    Comment: 0.5 pint liquor a week   Drug use: Never   Sexual activity:  Not Currently  Other Topics Concern   Not on file  Social History Narrative   Lives at home with wife. Wife states he has some dementia but is able to sign his own consent.   Social Determinants of Health   Financial Resource Strain: Not on file  Food Insecurity: Not on file  Transportation Needs: Not on file  Physical Activity: Not on file  Stress: Not on file  Social Connections: Not on file  Intimate Partner Violence: Not on file    FAMILY HISTORY: No family history on file.  ALLERGIES:  is allergic to enalapril.  MEDICATIONS:  Current Outpatient Medications  Medication Sig  Dispense Refill   atorvastatin (LIPITOR) 20 MG tablet Take 20 mg by mouth daily.     donepezil (ARICEPT) 10 MG tablet Take 10 mg by mouth at bedtime.     dorzolamide-timolol (COSOPT) 22.3-6.8 MG/ML ophthalmic solution Place 1 drop into both eyes 2 (two) times daily.      ELIQUIS 2.5 MG TABS tablet TAKE 1 TABLET BY MOUTH TWICE A DAY 60 tablet 1   folic acid (FOLVITE) 1 MG tablet Take 1 mg by mouth daily.     latanoprost (XALATAN) 0.005 % ophthalmic solution Place 1 drop into both eyes at bedtime.      lidocaine-prilocaine (EMLA) cream Apply 1 application topically as needed. Apply small amount to port site approx 1-2 hours prior to appointment. 30 g 1   MAG64 64 MG TBEC TAKE 1 TABLET (64 MG TOTAL) BY MOUTH 2 (TWO) TIMES DAILY. 180 tablet 0   magic mouthwash w/lidocaine SOLN Take 5 mLs by mouth 3 (three) times daily as needed for mouth pain. (Patient not taking: No sig reported) 5 mL 5   megestrol (MEGACE) 40 MG tablet TAKE 1 TABLET BY MOUTH TWICE A DAY 60 tablet 0   Multiple Vitamins-Minerals (CENTRUM ADULTS PO) Take by mouth.     nystatin (MYCOSTATIN) 100000 UNIT/ML suspension TAKE 5 MLS (500,000 UNITS TOTAL) BY MOUTH 4 (FOUR) TIMES DAILY. 480 mL 1   oxyCODONE (OXY IR/ROXICODONE) 5 MG immediate release tablet Take 1 tablet (5 mg total) by mouth every 4 (four) hours as needed for severe pain. (Patient not taking: No sig reported) 90 tablet 0   pantoprazole (PROTONIX) 20 MG tablet TAKE 1 TABLET BY MOUTH TWICE A DAY 180 tablet 1   Thiamine HCl (VITAMIN B-1 PO) Take 100 mg by mouth daily.     No current facility-administered medications for this visit.   Facility-Administered Medications Ordered in Other Visits  Medication Dose Route Frequency Provider Last Rate Last Admin   sodium chloride flush (NS) 0.9 % injection 10 mL  10 mL Intravenous PRN Gregory Server, MD   10 mL at 03/01/20 6599     PHYSICAL EXAMINATION: ECOG PERFORMANCE STATUS: 1 - Symptomatic but completely ambulatory Vitals:   12/06/20  0842  BP: 136/86  Pulse: 77  Temp: 98.2 F (36.8 C)  SpO2: 100%   Filed Weights   12/06/20 0842  Weight: 107 lb 8 oz (48.8 kg)    Physical Exam Constitutional:      General: He is not in acute distress. HENT:     Head: Normocephalic and atraumatic.  Eyes:     General: No scleral icterus. Cardiovascular:     Rate and Rhythm: Normal rate and regular rhythm.     Heart sounds: Normal heart sounds.  Pulmonary:     Effort: Pulmonary effort is normal. No respiratory distress.     Breath  sounds: No wheezing.  Abdominal:     General: Bowel sounds are normal. There is no distension.     Palpations: Abdomen is soft.  Musculoskeletal:        General: No deformity.     Cervical back: Normal range of motion and neck supple.  Lymphadenopathy:     Cervical: No cervical adenopathy.  Skin:    General: Skin is warm and dry.     Findings: No erythema or rash.  Neurological:     Mental Status: He is alert. Mental status is at baseline.     Cranial Nerves: No cranial nerve deficit.     Coordination: Coordination normal.    LABORATORY DATA:  I have reviewed the data as listed Lab Results  Component Value Date   WBC 6.0 11/15/2020   HGB 10.9 (L) 11/15/2020   HCT 32.8 (L) 11/15/2020   MCV 97.3 11/15/2020   PLT 190 11/15/2020   Recent Labs    10/13/20 0927 10/25/20 0818 11/15/20 0752  NA 139 138 138  K 3.9 4.0 3.7  CL 107 107 105  CO2 _0 GLUCOSE 120* 108* 124*  BUN _1 CREATININE 0.73 0.83 0.85  CALCIUM 9.1 9.5 9.5  GFRNONAA >60 >60 >60  PROT 6.7 7.3 7.0  ALBUMIN 3.5 3.9 4.0  AST _2 ALT _3 ALKPHOS 48 54 53  BILITOT 1.0 0.9 0.6    Iron/TIBC/Ferritin/ %Sat No results found for: IRON, TIBC, FERRITIN, IRONPCTSAT    RADIOGRAPHIC STUDIES: I have personally reviewed the radiological images as listed and agreed with the findings in the report. No results found.  ASSESSMENT & PLAN:  1. Squamous cell carcinoma of left lung ([place])   2.  Hypomagnesemia   3. 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 Encounter for antineoplastic immunotherapy   5. 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[facility])   Cancer Staging Larynx cancer ([place]) Staging form: Larynx - Glottis, AJCC 8th Edition - Clinical: Stage IVC (cT1, cNX, cM1) - Signed by Gregory Server, MD on 07/26/2020  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[facility]) Staging form: Pharynx - P16 Negative Oropharynx, AJCC 8th Edition - Clinical stage from 06/23/2019: Stage IVB (cT4b, cN2b, cM0, p16-) - Signed by Gregory Server, MD on 06/23/2019  Squamous cell lung cancer ([place]) Staging form: Lung, AJCC 8th Edition - Clinical: cT1, cN0, cM0 - Signed by Gregory Server, MD on 07/19/2019   #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 Pending restaging CT.  #Hypomagnesia, continue oral Slow-Mag, continue 1 tablet twice daily.    #Left upper lobe squamous cell carcinoma, stage I Finished SBRT on 02/07/2020.  Monitor disease status.  #Right jugular vein thrombus, continue Eliquis 2.5 mg twice daily.  We will decide duration of anticoagulation after reimaging.  #Weight loss, weight has been stable.  Continue nutrition supplementation.    all questions were answered. The patient knows to call the clinic with any problems questions or concerns.   Return of visit: lab/MD/Keytruda in 3 weeks  ZEarlie Server MD, PhD 12/06/2020

## 2020-12-06 NOTE — Progress Notes (Signed)
Nutrition Follow-up:  Patient with metastatic head and neck cancer.  Patient on keytruda.  Met with wife today while patient was in infusion.  Patient with memory issues.  Wife reports recent episode of gout but this has resolved.  Patient not having any nutrition impact symptoms from current treatment.  Continues drinking ensure 3 a day. Also eating the same.  Reports that patient will be having rest of teeth removed soon and new plate created.      Medications: reviewed  Labs: reviewed  Anthropometrics:   Weight 107 lb 8 oz  107 lb on 9/21 104 lb 8/10 104 lb on 7/13 107 lb on 6/8 109 lb on 6/1 112 lb on 4/1   NUTRITION DIAGNOSIS: Inadequate oral intake stable   INTERVENTION:  Complimentary case of ensure given to wife today Continue eating high calorie, high protein soft foods and ensure shakes    MONITORING, EVALUATION, GOAL: weight trends, intake   NEXT VISIT: Wednesday, Nov 2nd during infusion  Naveen Lorusso B. Zenia Resides, [place], [place] Registered Dietitian [phone] (mobile)

## 2020-12-11 ENCOUNTER — Ambulatory Visit
Admission: RE | Admit: 2020-12-11 | Discharge: 2020-12-11 | Disposition: A | Discharge status: Home / Self Care | Payer: Medicare Other | Source: Ambulatory Visit | Attending: Oncology | Admitting: Oncology

## 2020-12-11 ENCOUNTER — Other Ambulatory Visit: Payer: Self-pay

## 2020-12-11 DIAGNOSIS — C109 Malignant neoplasm of oropharynx, unspecified: Secondary | ICD-10-CM | POA: Insufficient documentation

## 2020-12-11 DIAGNOSIS — C3492 Malignant neoplasm of unspecified part of left bronchus or lung: Secondary | ICD-10-CM | POA: Insufficient documentation

## 2020-12-11 IMAGING — CT CT NECK W/ CM
3 of 4 series · 12 of 35 positions shown, 14 images · IV contrast (omnipaque)
Comparison: CT Chest, Abdomen, and Pelvis from the same day
reported separately.

Neck CT [DATE].  PET-CT [DATE].

CLINICAL DATA: 80-year-old male with a history of oropharyngeal
carcinoma, left upper lobe squamous cell lung carcinoma, thoracic
spine metastases.

EXAM:
CT NECK WITH CONTRAST
TECHNIQUE: Multidetector CT imaging of the neck was performed using the
standard protocol following the bolus administration of intravenous
contrast.
CONTRAST:  100mL OMNIPAQUE IOHEXOL 300 MG/ML SOLN in conjunction
with contrast enhanced imaging of the chest, abdomen, and pelvis
reported separately.

[Series 1: axial neck · axial · 0.49mm/px · z∈[+161,+313]mm · 4 of 116 slices shown, 5 images]
[im 20/116  soft-tissue]
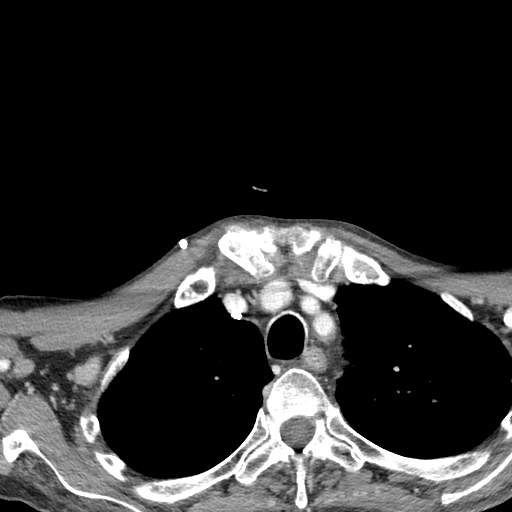
[im 20/116  bone]
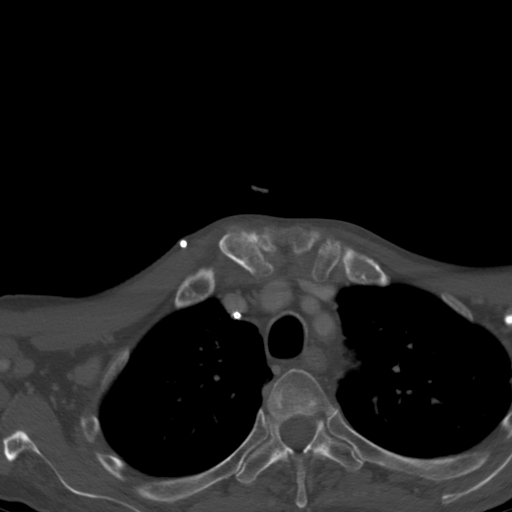
[im 39/116  bone]
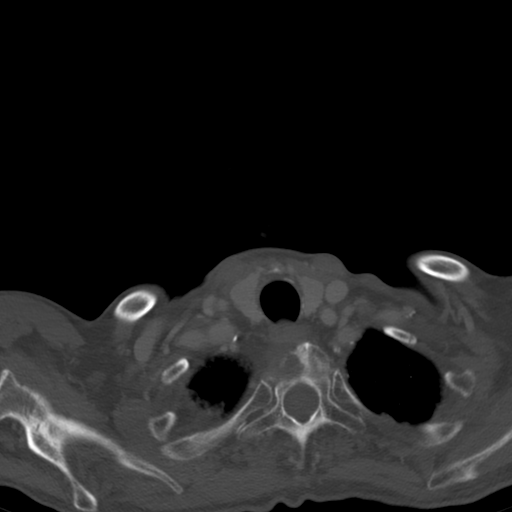
[im 77/116  bone]
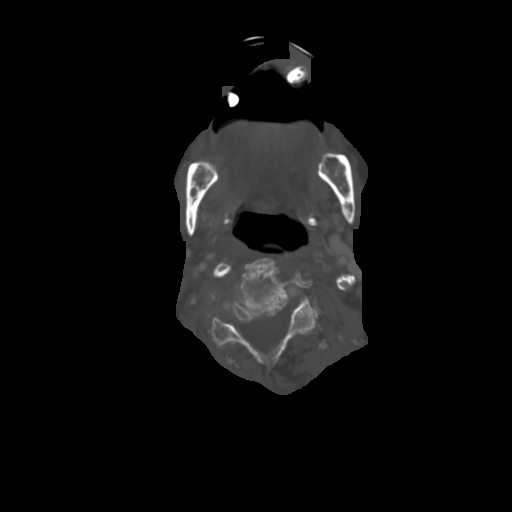
[im 96/116  bone]
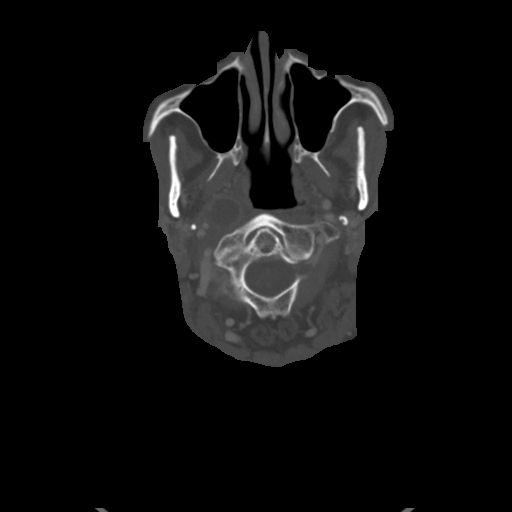

[Series 6: sag neck · sagittal · 0.40mm/px · 5 of 100 slices shown, 6 images]
[im 34/100  bone]
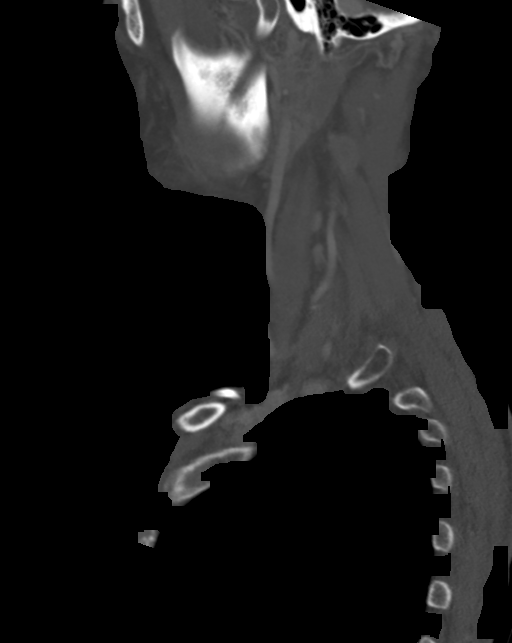
[im 42/100  bone]
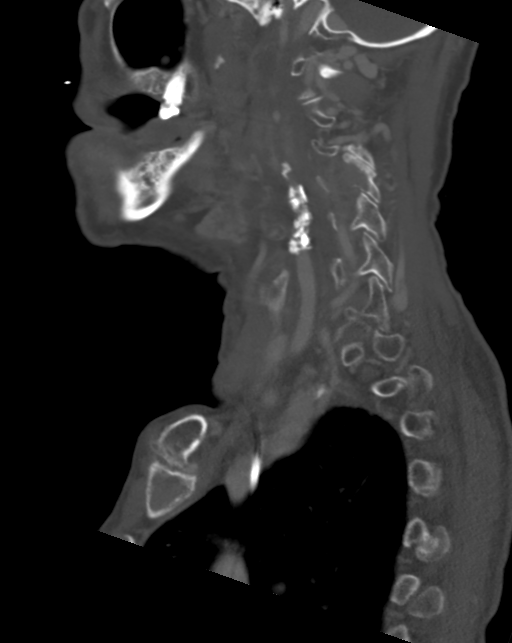
[im 50/100  soft-tissue]
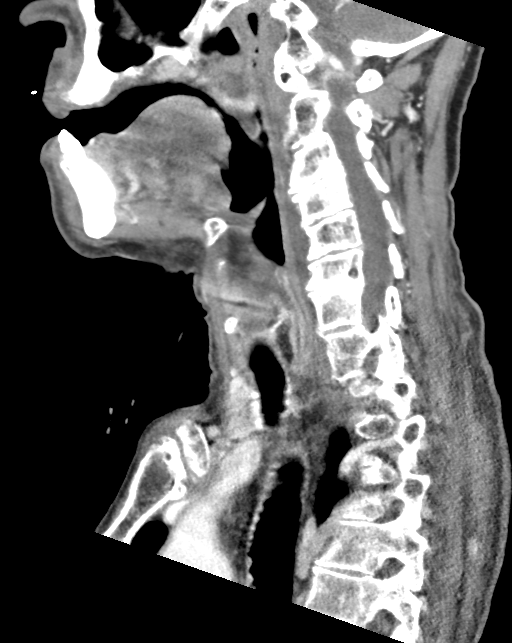
[im 50/100  bone]
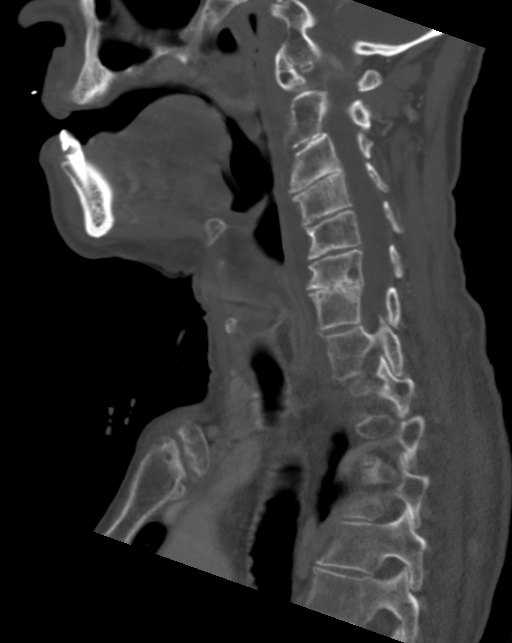
[im 58/100  bone]
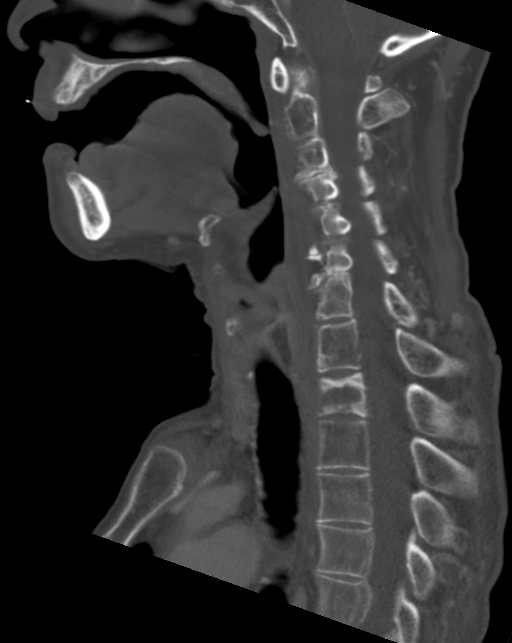
[im 67/100  bone]
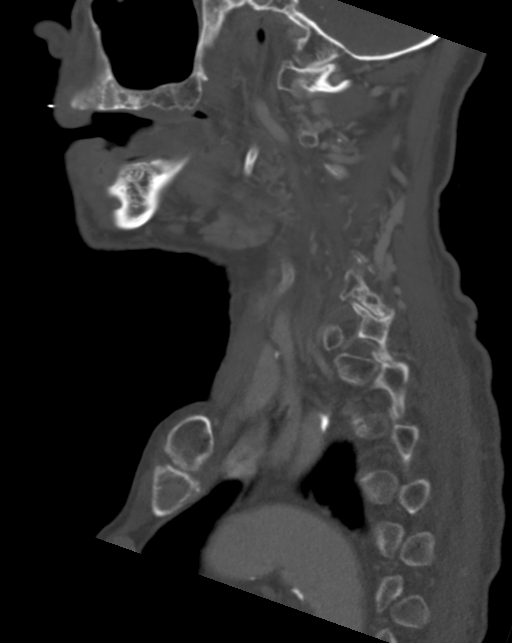

[Series 7: cor neck · coronal · 0.39mm/px · 3 of 102 slices shown]
[im 29/102  bone]
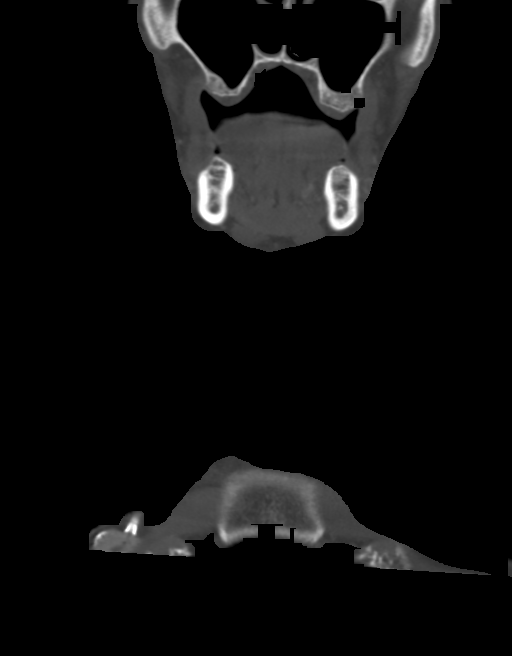
[im 44/102  bone]
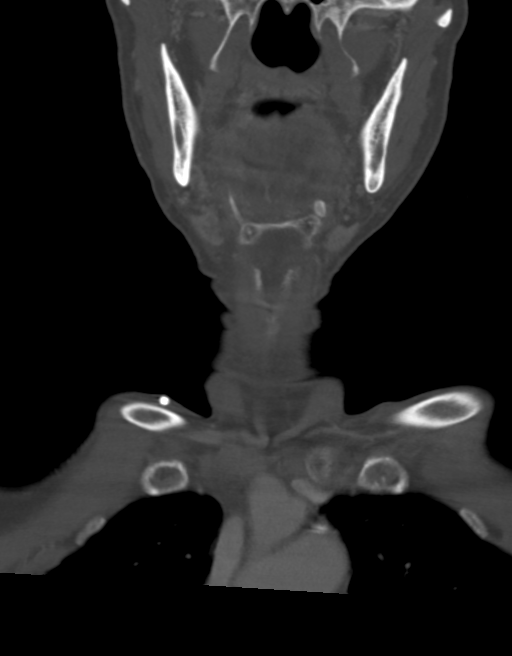
[im 58/102  bone]
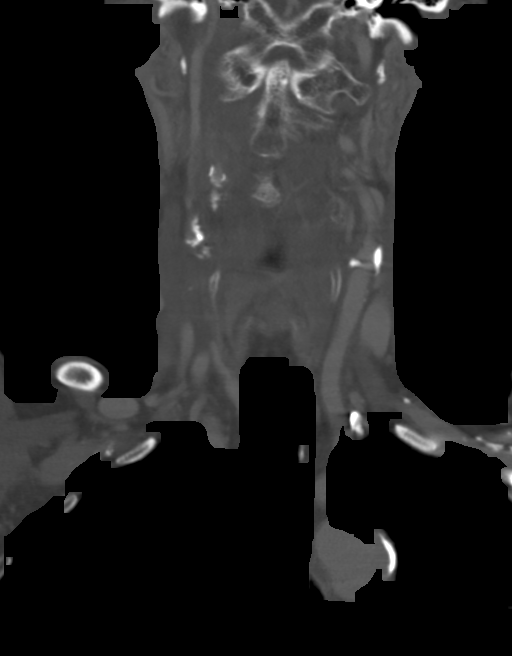

[12 of 35 positions shown; findings below may reference images not displayed]

FINDINGS: Pharynx and larynx: Pharyngeal and pharyngeal soft tissue contours
are within normal limits. Supraglottic laryngeal soft tissue
thickening has regressed [REDACTED], might be sequelae of radiation.
No discrete hyperenhancement or mass.

Retropharyngeal effusion has mildly increased. Parapharyngeal spaces
are stable.

Salivary glands: Post radiation changes, otherwise negative.

Thyroid: Negative.

Lymph nodes: Post treatment architectural distortion at the right
level 2 nodal station, and nearby cystic right retropharyngeal node
(series 1, not significantly changed from May.

However, and irregular and enhancing 7-8 mm short axis soft tissue
nodule at the right level 2 B station has increased since that time
suspicious for progressed metastatic nodal disease on series 1,
image 30. This was hypermetabolic and smaller on the [REDACTED] PET-CT.

Right level IIIa nodal station indistinct, ex nodal tumor on series
1, image 56 appears stable [REDACTED], with hypermetabolic in [REDACTED].

Small but increased level 1 lymph nodes are up to 5 mm short axis
and indeterminate. Other nodal stations appear stable.

Vascular: Chronic segmental right jugular vein thrombosis secondary
to the lymph node disease above. Right side Port-A-Cath remains in
place. Severe chronic carotid calcified atherosclerosis with
high-grade string sign stenosis on the right, but the right ICA
remains patent.

Right vertebral artery remains patent although is now engulfed by
tumor at the C3 level. See series 1, image 36 and osseous details
below.

Limited intracranial: Stable. Calcified atherosclerosis at the skull
base.

Visualized orbits: Negative.

Mastoids and visualized paranasal sinuses: Clear.

Skeleton: New [REDACTED] destructive osseous lesion involving the
right lateral body and posterior elements of C3. See series 7, image
67 and series 1, images 35 and 36. Tumor here encompasses up to 26
mm diameter and engulfs the right vertebral artery which remains
patent. The right pedicle and a portion of the facet is eroded.
Cervical vertebral alignment is maintained. The right C4 neural
foramen is fiilled with tumor and there is mild right lateral
epidural tumor.

Superimposed cervical spine degeneration including chronic ankylosis
of the right C2-C3 facet.

Congenital incomplete ossification of the posterior C1 ring. No
other osseous metastatic disease identified in the neck. Absent and
carious dentition. Chronic T2 right side vertebral body metastasis,
see chest CT reported separately.

Upper chest: Reported separately.
IMPRESSION: 1. Progression of Disease:
- new since [DATE] destructive osseous metastasis has eroded
the right pedicle and facet, with tumor filling the right C4 neural
foramen and engulfing the right vertebral artery which remains
patent. Mild right lateral epidural tumor.
- small (8 mm short axis) but progressed right level 2A nodal
disease with evidence of extracapsular extension.

2. Cystic/Necrotic right retropharyngeal node and right level 3
ex-nodal disease causing chronic segmental occlusion of the right
jugular vein are stable.

3. No recurrent laryngeal or pharyngeal tumor identified.

4. Severe carotid atherosclerosis and stenosis greater on the right.
Right ICA remains patent.

5. CT Chest, Abdomen, and Pelvis the same day are reported
separately.

## 2020-12-11 IMAGING — CT CT CHEST-ABD-PELV W/ CM
2 of 5 series · 13 of 36 positions shown, 15 images · IV contrast (omnipaque)
Comparison: PET-CT, [DATE]

CLINICAL DATA: Follow-up left upper lobe squamous cell carcinoma,
metastatic oropharyngeal carcinoma, restaging

EXAM:
CT CHEST, ABDOMEN, AND PELVIS WITH CONTRAST
TECHNIQUE: Multidetector CT imaging of the chest, abdomen and pelvis was
performed following the standard protocol during bolus
administration of intravenous contrast.
CONTRAST:  100mL OMNIPAQUE IOHEXOL 300 MG/ML  SOLN

[Series 3: cap with · axial · 0.72mm/px · z∈[-338,+167]mm · 10 of 125 slices shown, 12 images]
[im 12/125  mediastinal]
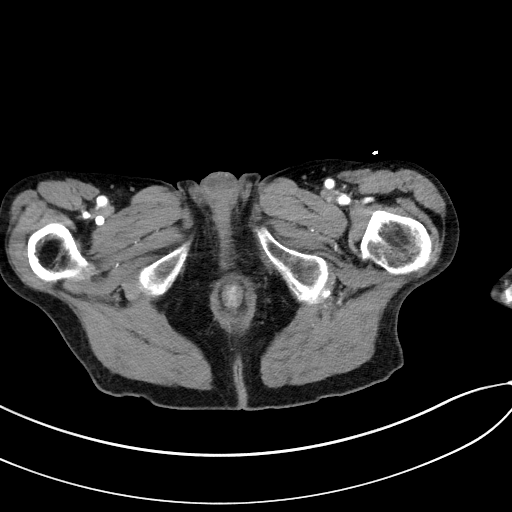
[im 12/125  bone]
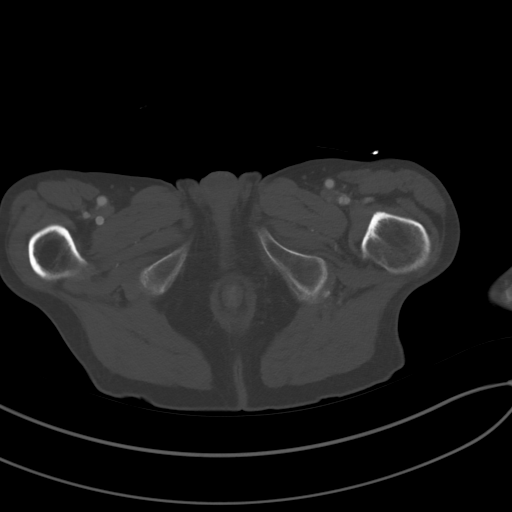
[im 23/125  mediastinal]
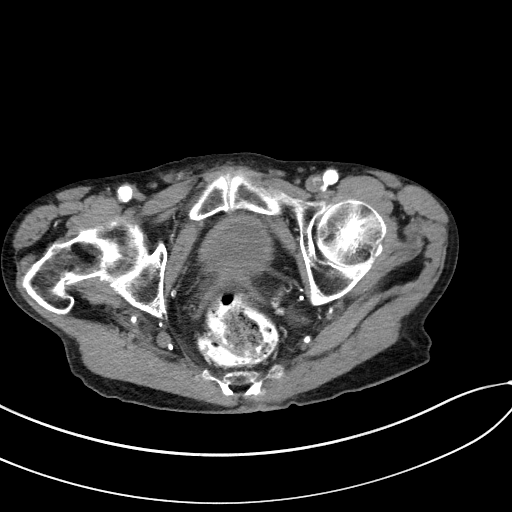
[im 34/125  mediastinal]
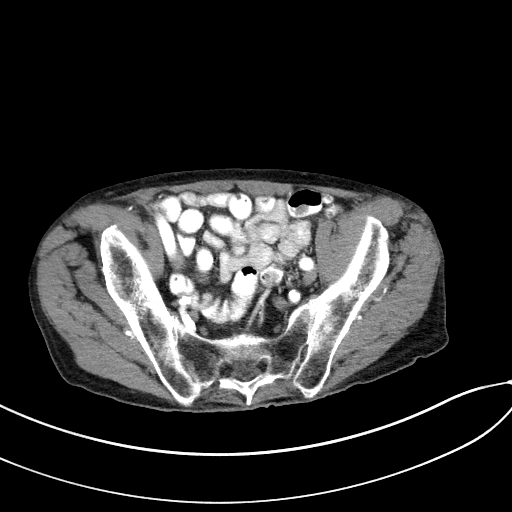
[im 46/125  mediastinal]
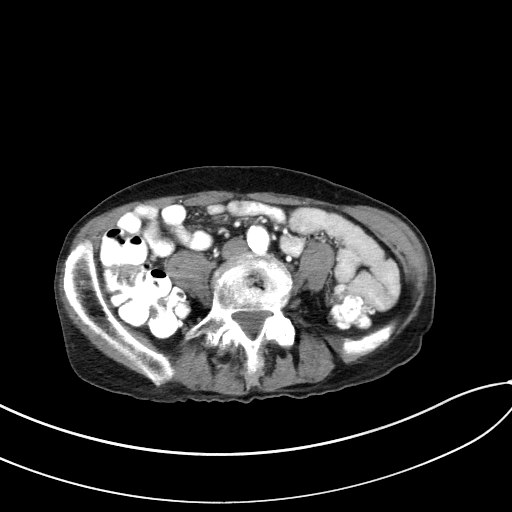
[im 57/125  mediastinal]
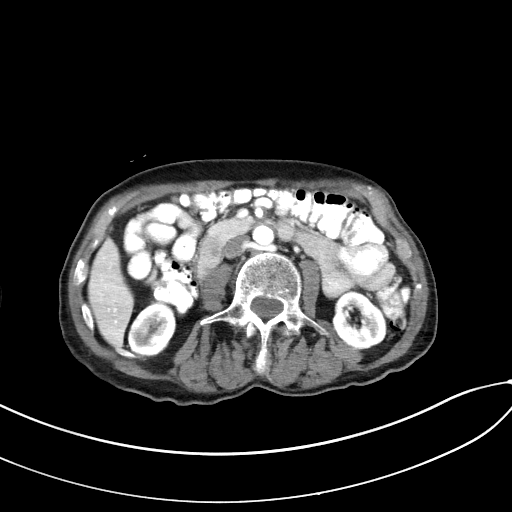
[im 68/125  mediastinal]
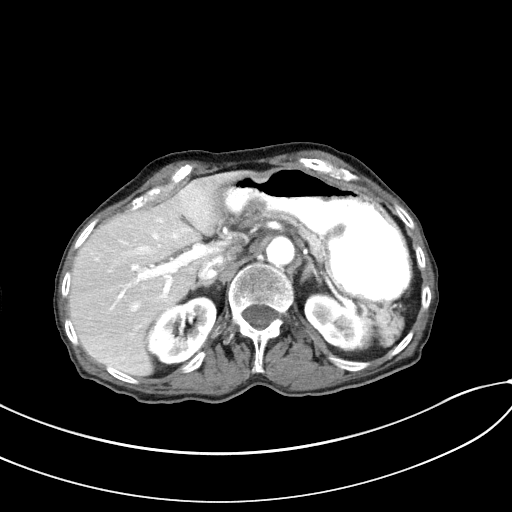
[im 79/125  mediastinal]
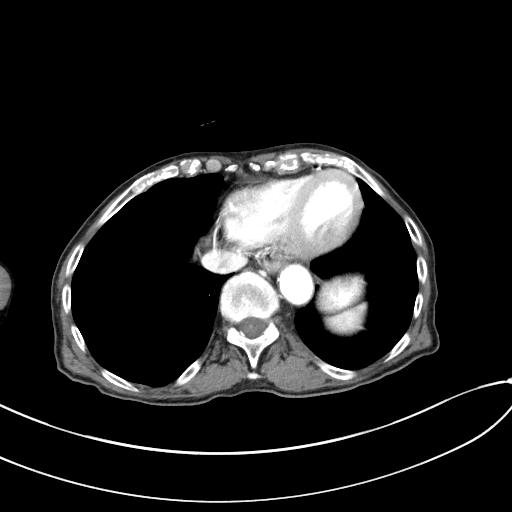
[im 91/125  mediastinal]
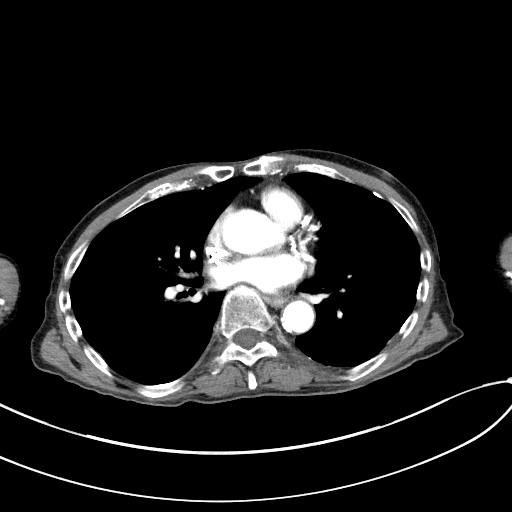
[im 102/125  mediastinal]
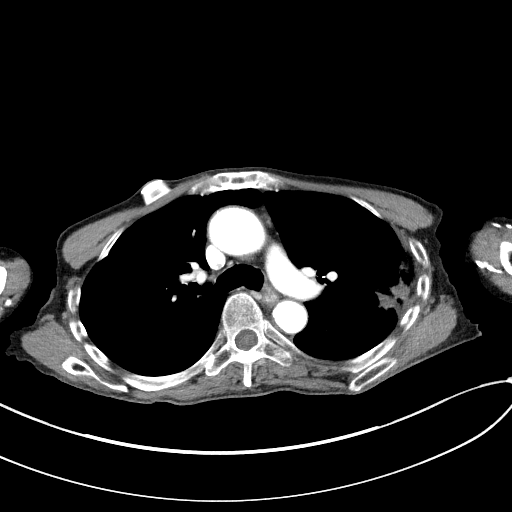
[im 102/125  bone]
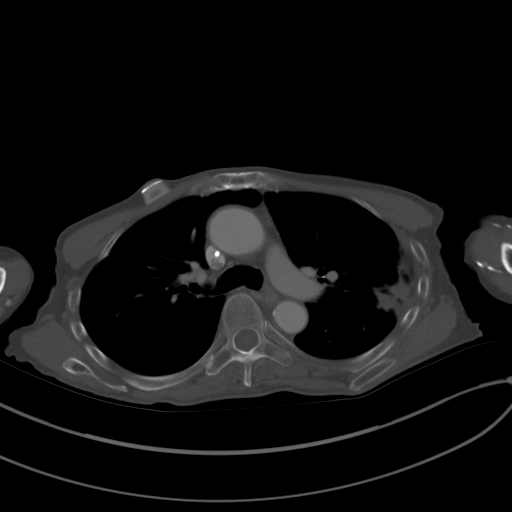
[im 113/125  mediastinal]
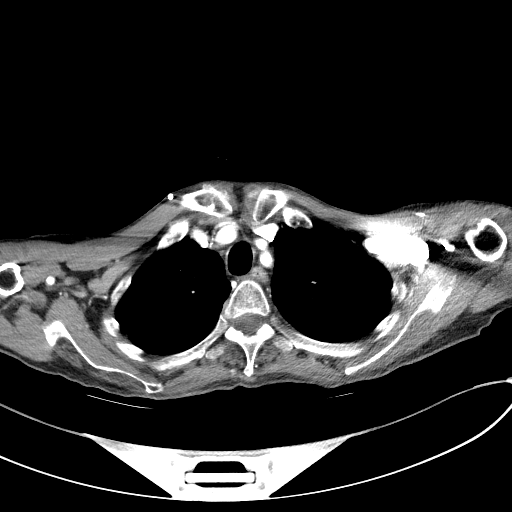

[Series 8: coronals · coronal · 0.66mm/px · 3 of 104 slices shown]
[im 21/104  mediastinal]
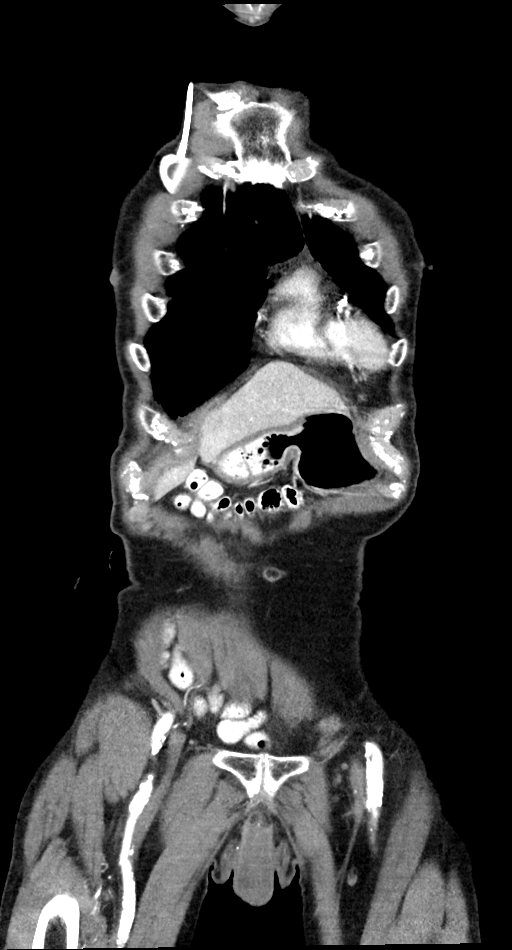
[im 42/104  mediastinal]
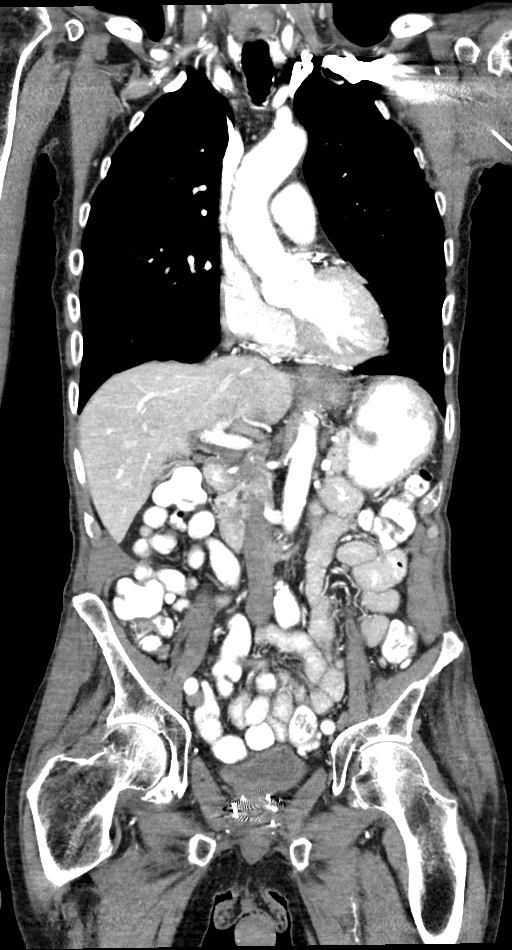
[im 62/104  mediastinal]
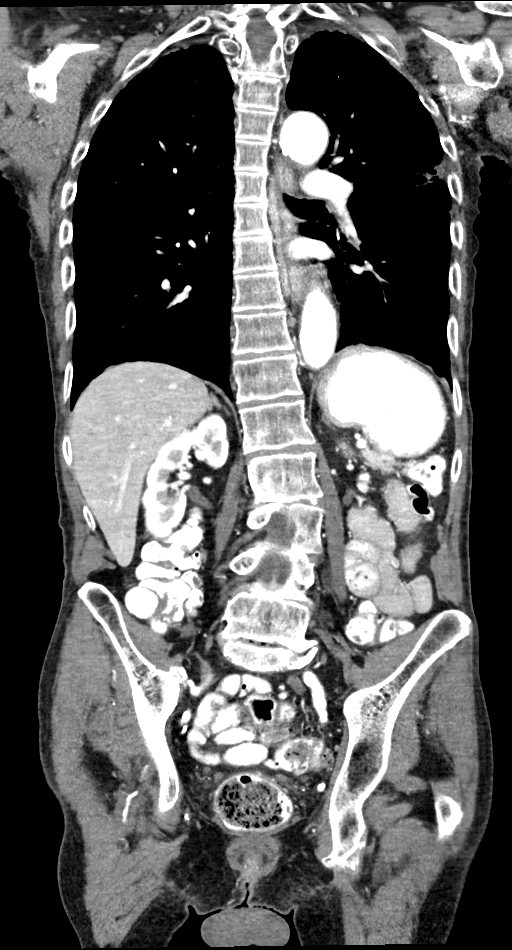

[13 of 36 positions shown; findings below may reference images not displayed]

FINDINGS: CT CHEST FINDINGS

Cardiovascular: Right chest port catheter. Aortic atherosclerosis.
Normal heart size. Extensive 3 vessel coronary artery calcifications
and/or stents status post CABG. No pericardial effusion.

Mediastinum/Nodes: No enlarged mediastinal, hilar, or axillary lymph
nodes. Thyroid gland, trachea, and esophagus demonstrate no
significant findings.

Lungs/Pleura: Expected interval evolution of radiation fibrosis
about a treated nodule of the peripheral left upper lobe, measuring
approximately 2.7 x 2.3 cm, not significantly changed (series 7,
image 59). Bilateral calcified pleural plaques. No pleural effusion
or pneumothorax.

Musculoskeletal: No chest wall mass. Redemonstrated lytic lesion of
the right aspect of T2 (series 9, image 128) as well as the left
scapula near the coronoid process and glenoid (series 7, image 10).

CT ABDOMEN PELVIS FINDINGS

Hepatobiliary: No solid liver abnormality is seen. No gallstones,
gallbladder wall thickening, or biliary dilatation.

Pancreas: Unremarkable. No pancreatic ductal dilatation or
surrounding inflammatory changes.

Spleen: Normal in size without significant abnormality.

Adrenals/Urinary Tract: Adrenal glands are unremarkable. Kidneys are
normal, without renal calculi, solid lesion, or hydronephrosis.
Bladder is unremarkable.

Stomach/Bowel: Stomach is within normal limits. Appendix appears
normal. No evidence of bowel wall thickening, distention, or
inflammatory changes. Sigmoid diverticula.

Vascular/Lymphatic: Aortic atherosclerosis. No enlarged abdominal or
pelvic lymph nodes.

Reproductive: Prostate brachytherapy pellets.

Other: No abdominal wall hernia or abnormality. No abdominopelvic
ascites.

Musculoskeletal: No acute or significant osseous findings.
IMPRESSION: 1. Expected interval evolution of radiation fibrosis about a treated
nodule of the peripheral left upper lobe, measuring approximately
2.7 x 2.3 cm, not significantly changed.
2. Redemonstrated lytic osseous metastatic lesions of the right
aspect of T2 as well as the left scapula. No new lesions identified
by CT.
3. No evidence of metastatic disease in the abdomen or pelvis.
4. Prostate brachytherapy.
5. Coronary artery disease.
6. Sigmoid diverticulosis.

Aortic Atherosclerosis ([ZW]-[ZW]).

## 2020-12-11 MED ORDER — IOHEXOL 300 MG/ML  SOLN
100.0000 mL | Freq: Once | INTRAMUSCULAR | Status: AC | PRN
  Administered 2020-12-11: 100 mL via INTRAVENOUS

## 2020-12-11 MED ORDER — IOHEXOL 350 MG/ML SOLN: 80.0000 mL | Freq: Once | INTRAVENOUS | Status: DC | PRN

## 2020-12-27 ENCOUNTER — Inpatient Hospital Stay (HOSPITAL_BASED_OUTPATIENT_CLINIC_OR_DEPARTMENT_OTHER): Payer: Medicare Other | Admitting: Oncology

## 2020-12-27 ENCOUNTER — Inpatient Hospital Stay: Payer: Medicare Other

## 2020-12-27 ENCOUNTER — Encounter: Payer: Self-pay | Admitting: Oncology

## 2020-12-27 ENCOUNTER — Inpatient Hospital Stay: Payer: Medicare Other | Attending: Oncology

## 2020-12-27 ENCOUNTER — Other Ambulatory Visit: Payer: Self-pay

## 2020-12-27 VITALS — BP 131/81 | HR 71 | Temp 97.8°F | Resp 18 | Wt 103.5 lb

## 2020-12-27 DIAGNOSIS — C3492 Malignant neoplasm of unspecified part of left bronchus or lung: Secondary | ICD-10-CM | POA: Diagnosis not present

## 2020-12-27 DIAGNOSIS — C7951 Secondary malignant neoplasm of bone: Secondary | ICD-10-CM

## 2020-12-27 DIAGNOSIS — C109 Malignant neoplasm of oropharynx, unspecified: Secondary | ICD-10-CM | POA: Insufficient documentation

## 2020-12-27 DIAGNOSIS — Z87891 Personal history of nicotine dependence: Secondary | ICD-10-CM | POA: Diagnosis not present

## 2020-12-27 DIAGNOSIS — F1027 Alcohol dependence with alcohol-induced persisting dementia: Secondary | ICD-10-CM

## 2020-12-27 DIAGNOSIS — C3412 Malignant neoplasm of upper lobe, left bronchus or lung: Secondary | ICD-10-CM | POA: Insufficient documentation

## 2020-12-27 DIAGNOSIS — C329 Malignant neoplasm of larynx, unspecified: Secondary | ICD-10-CM | POA: Diagnosis not present

## 2020-12-27 DIAGNOSIS — I82C11 Acute embolism and thrombosis of right internal jugular vein: Secondary | ICD-10-CM | POA: Insufficient documentation

## 2020-12-27 DIAGNOSIS — R634 Abnormal weight loss: Secondary | ICD-10-CM

## 2020-12-27 DIAGNOSIS — Z7901 Long term (current) use of anticoagulants: Secondary | ICD-10-CM | POA: Diagnosis not present

## 2020-12-27 DIAGNOSIS — F1097 Alcohol use, unspecified with alcohol-induced persisting dementia: Secondary | ICD-10-CM | POA: Insufficient documentation

## 2020-12-27 LAB — COMPREHENSIVE METABOLIC PANEL
ALT: 14 U/L (ref 0–44)
AST: 18 U/L (ref 15–41)
Albumin: 3.9 g/dL (ref 3.5–5.0)
Alkaline Phosphatase: 61 U/L (ref 38–126)
Anion gap: 6 (ref 5–15)
BUN: 23 mg/dL (ref 8–23)
CO2: 27 mmol/L (ref 22–32)
Calcium: 9.1 mg/dL (ref 8.9–10.3)
Chloride: 108 mmol/L (ref 98–111)
Creatinine, Ser: 0.92 mg/dL (ref 0.61–1.24)
GFR, Estimated: >60 mL/min (ref >60)
Glucose, Bld: 117 mg/dL — ABNORMAL HIGH (ref 70–99)
Potassium: 4.1 mmol/L (ref 3.5–5.1)
Sodium: 141 mmol/L (ref 135–145)
Total Bilirubin: 0.6 mg/dL (ref 0.3–1.2)
Total Protein: 7.4 g/dL (ref 6.5–8.1)

## 2020-12-27 LAB — CBC WITH DIFFERENTIAL/PLATELET
Abs Immature Granulocytes: 0.02 10*3/uL (ref 0.00–0.07)
Basophils Absolute: 0 10*3/uL (ref 0.0–0.1)
Basophils Relative: 0 %
Eosinophils Absolute: 0.1 10*3/uL (ref 0.0–0.5)
Eosinophils Relative: 1 %
HCT: 36.7 % — ABNORMAL LOW (ref 39.0–52.0)
Hemoglobin: 11.8 g/dL — ABNORMAL LOW (ref 13.0–17.0)
Immature Granulocytes: 0 %
Lymphocytes Relative: 10 %
Lymphs Abs: 0.8 10*3/uL (ref 0.7–4.0)
MCH: 30.2 pg (ref 26.0–34.0)
MCHC: 32.2 g/dL (ref 30.0–36.0)
MCV: 93.9 fL (ref 80.0–100.0)
Monocytes Absolute: 0.7 10*3/uL (ref 0.1–1.0)
Monocytes Relative: 9 %
Neutro Abs: 6 10*3/uL (ref 1.7–7.7)
Neutrophils Relative %: 80 %
Platelets: 225 10*3/uL (ref 150–400)
RBC: 3.91 MIL/uL — ABNORMAL LOW (ref 4.22–5.81)
RDW: 12.1 % (ref 11.5–15.5)
WBC: 7.6 10*3/uL (ref 4.0–10.5)
nRBC: 0 % (ref 0.0–0.2)

## 2020-12-27 MED ORDER — HEPARIN SOD (PORK) LOCK FLUSH 100 UNIT/ML IV SOLN
500.0000 [IU] | Freq: Once | INTRAVENOUS | Status: DC
  Filled 2020-12-27: qty 5

## 2020-12-27 MED ORDER — HEPARIN SOD (PORK) LOCK FLUSH 100 UNIT/ML IV SOLN
500.0000 [IU] | Freq: Once | INTRAVENOUS | Status: AC
  Administered 2020-12-27: 500 [IU] via INTRAVENOUS
  Filled 2020-12-27: qty 5

## 2020-12-27 MED ORDER — SODIUM CHLORIDE 0.9% FLUSH
10.0000 mL | INTRAVENOUS | Status: DC | PRN
  Administered 2020-12-27: 10 mL via INTRAVENOUS
  Filled 2020-12-27: qty 10

## 2020-12-27 NOTE — Progress Notes (Signed)
Pt here for follow up. Pt reports feeling weakness.

## 2020-12-27 NOTE — Progress Notes (Signed)
Nutrition Follow-up:  Patient with metastatic head and neck cancer.  Patient on keytruda.  Spoke with wife while patient in bathroom this pm.  Wife reports that he is not receiving treatment today, scan showed progression.  Wife reports that they will be meeting with Dr Baruch Gouty about more radiation.    Wife says that patient is doing ok.  Still trying to encourage him to eat and drink ensure shakes.  "I can't only do so much."    Medications: reviewed  Labs: reviewed  Anthropometrics:   Weight 103 lb 8 oz today  107 lb 8 oz on 10/12 107 lb on 9/21 104 lb on 8/10 104 lb on 7/13 107 lb on 6/8 109 lb on 6/1 112 lb on 4/1   NUTRITION DIAGNOSIS: Inadequate oral intake stable   INTERVENTION:  Complimentary case of ensure given to wife today. Active listening provided for wife Continue to encourage high calorie, high protein foods    MONITORING, EVALUATION, GOAL: weight trends, intake   NEXT VISIT: to be determined   Lis Savitt B. Zenia Resides, [place], [place] Registered Dietitian [phone] (mobile)

## 2020-12-27 NOTE — Progress Notes (Signed)
Hematology/Oncology progress note [facility] Telephone:([phone] [phone]   Patient Care Team: Casilda Carls, MD as PCP - General (Internal Medicine) Earlie Server, [name], [name]  HISTORY OF PRESENTING ILLNESS:  History of prostate cancer. S/p Radiation/seed, he follows up with [facility].  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  PD-L1-IHC 40%, no targetable mutations. # 09/07/2019 chemotherapy [cisplatin] and radiation.   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  Interval decrease of the posterior left upper lobe pleural-based nodule with interval decrease in size of the tiny adjacent satellite nodule.  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  No recurrent tumor in right piriform sinus.  Right lateral pharyngeal lymph node is stable.  No new or recurrent adenopathy.Suspect metastasis.  I recommend patient to obtain PET scan restaging.  I discussed about the plan of re-biopsy of either the paraspinal soft tissue mass versus other more feasible hypermetabolic sites detected on PET scan. Patient was seen by Dr. Kennyth Lose and opted to proceed with palliative radiation as soon as possible.  PET scan cannot be arranged prior to the radiation so the PET scan was canceled. 04/20/2020 -04/25/2020, palliative radiation to thoracic  spine  05/10/2020, MRI thoracic spine was obtained for worsening of back pain and left shoulder pain.  Images showed metastatic disease throughout almost the entire T2 with and associated mild superior endplate compression fracture.  Abnormal signal in the anterior, inferior endplate of T1, superior aspect of the T3.  Small metastatic deposit in T12 is also noted. Case was also discussed on multidisciplinary tumor board on 05/25/2020 05/22/2020, PET scan showed new hypermetabolic lytic bone metastasis in the left scapular near the glenoid and in the T2 vertebral body.  Persistent hypermetabolic right lateral retropharyngeal and right level 3 neck lymph node metastasis.  Peripheral left upper lobe pulmonary nodule is mildly decreased with new surrounding mild platelike postradiation changes..  No residual recurrent neoplasm at the site of vomiting in the right piriform sinus  06/07/2020 -06/09/2020 patient underwent palliative radiation to left scapular April 2022 seen by [name] and flexible laryngoscope examination showed new lesion at the right false vocal cord.  Case was discussed on tumor board on 06/22/2020. [name] recommend additional CT soft tissue neck with contrast for additional evaluation. 06/29/2020 CT images were reviewed and discussed with patient. There is new malignant lymph node in the right mid neck at the level of the hyoid, this compressing the right jugular vein with thrombus in the vein above and below the lymph node.  Cystic retropharyngeal lymph node on the right is stable.  Progressive soft tissue thickening in the larynx bilaterally suspicious for tumor.  Metastatic disease at the T2 is unchanged.  Metastatic deposit left scapula there is now a pathological fracture of the scapular. CT scan findings were discussed with ENT [name] via secure chat.  [name] feels surgery is not feasible.  The larynx lesion currently does not compromise his airway however further  progression may in the future  07/10/2020 Seen by Radonc [name] who would offer salvage radiation to his larynx as well as neck adenopathy. Would like low dose chemotherapy as sensitizer.    Patient was also referred to establish care with Dr. Georgiann Cocker at [facility] for kyphoplasty.  Patient's wife reports that they were never contacted and she called Dr. Georgiann Cocker office with no success.  # NGS.  PD-L1-IHC 40%, no targetable mutations # 07/25/20- 07/27/2020, 08/01/20- 09/06/2020  concurrent chemoradiation [low-dose weekly cisplatin $RemoveBeforeDE'30mg'[id number]$ /m2 ] to the larynx  09/15/2020, patient was seen by ENT [name].  Laryngoscope examination reviewed clear larynx, no lesion was discovered. 10/04/2020, started on maintenance Keytruda.  INTERVAL HISTORY Gregory Burton is a 80 y.o. male who has above history reviewed by me today presents for follow-up of head and neck cancer and lung cancer Patient was accompanied by wife.  He tolerates Keytruda.   12/11/2020 restaging CT 1. Progression of Disease: - new since May right C3 destructive osseous metastasis has eroded the right pedicle and facet, with tumor filling the right C4 neural foramen and engulfing the right vertebral artery which remains patent. Mild right lateral epidural tumor. - small (8 mm short axis) but progressed right level 2A nodal disease with evidence of extracapsular extension. 2. 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  3. No recurrent laryngeal or pharyngeal tumor identified.  4. Severe carotid atherosclerosis and stenosis greater on the right.Right ICA remains patent. 5. CT Chest, Abdomen, and Pelvis the same day are reported separately.    Review of Systems  Constitutional:  Positive for fatigue. Negative for appetite change, chills, fever and unexpected weight change.  HENT:   Positive for hearing loss and voice change.   Eyes:   Negative for eye problems and icterus.  Respiratory:  Negative for chest tightness, cough and shortness of breath.   Cardiovascular:  Negative for chest pain and leg swelling.  Gastrointestinal:  Negative for abdominal distention, abdominal pain, blood in stool and nausea.  Endocrine: Negative for hot flashes.  Genitourinary:  Negative for difficulty urinating, dysuria and frequency.   Musculoskeletal:  Positive for arthralgias.  Skin:  Negative for itching and rash.  Neurological:  Negative for extremity weakness, light-headedness and numbness.  Hematological:  Negative for adenopathy. Does not bruise/bleed easily.  Psychiatric/Behavioral:  Negative for confusion.        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   Prostate CA ([place]) 2008   Squamous cell lung cancer ([place]) 06/23/2019    SURGICAL HISTORY: Past Surgical History:  Procedure Laterality Date   BRAIN SURGERY  2012   HERNIA REPAIR     PORTA CATH INSERTION N/A 07/27/2019   Procedure: PORTA CATH INSERTION;  Surgeon: Katha Cabal, MD;  Location: [facility];  Service: Cardiovascular;  Laterality: N/A;    SOCIAL HISTORY: Social History   Socioeconomic History   Marital status: Married    Spouse name: Not on file   Number of children: Not on file   Years of education:  Not on file   Highest education level: Not on file  Occupational History   Not on file  Tobacco Use   Smoking status: Former    Years: 21.00    Types: Cigarettes    Quit date: 05/11/2008    Years since quitting: 12.6   Smokeless tobacco: Never  Vaping Use   Vaping Use: Never used  Substance and Sexual Activity   Alcohol use: Yes    Comment: 0.5 pint liquor a week   Drug use: Never    Sexual activity: Not Currently  Other Topics Concern   Not on file  Social History Narrative   Lives at home with wife. Wife states he has some dementia but is able to sign his own consent.   Social Determinants of Health   Financial Resource Strain: Not on file  Food Insecurity: Not on file  Transportation Needs: Not on file  Physical Activity: Not on file  Stress: Not on file  Social Connections: Not on file  Intimate Partner Violence: Not on file    FAMILY HISTORY: History reviewed. No pertinent family history.  ALLERGIES:  is allergic to enalapril.  MEDICATIONS:  Current Outpatient Medications  Medication Sig Dispense Refill   atorvastatin (LIPITOR) 20 MG tablet Take 20 mg by mouth daily.     donepezil (ARICEPT) 10 MG tablet Take 10 mg by mouth at bedtime.     dorzolamide-timolol (COSOPT) 22.3-6.8 MG/ML ophthalmic solution Place 1 drop into both eyes 2 (two) times daily.      ELIQUIS 2.5 MG TABS tablet TAKE 1 TABLET BY MOUTH TWICE A DAY 60 tablet 1   folic acid (FOLVITE) 1 MG tablet Take 1 mg by mouth daily.     latanoprost (XALATAN) 0.005 % ophthalmic solution Place 1 drop into both eyes at bedtime.      lidocaine-prilocaine (EMLA) cream Apply 1 application topically as needed. Apply small amount to port site approx 1-2 hours prior to appointment. 30 g 1   MAG64 64 MG TBEC TAKE 1 TABLET (64 MG TOTAL) BY MOUTH 2 (TWO) TIMES DAILY. 180 tablet 0   magic mouthwash w/lidocaine SOLN Take 5 mLs by mouth 3 (three) times daily as needed for mouth pain. 5 mL 5   megestrol (MEGACE) 40 MG tablet TAKE 1 TABLET BY MOUTH TWICE A DAY 60 tablet 0   Multiple Vitamins-Minerals (CENTRUM ADULTS PO) Take by mouth.     nystatin (MYCOSTATIN) 100000 UNIT/ML suspension TAKE 5 MLS (500,000 UNITS TOTAL) BY MOUTH 4 (FOUR) TIMES DAILY. 480 mL 1   oxyCODONE (OXY IR/ROXICODONE) 5 MG immediate release tablet Take 1 tablet (5 mg total) by mouth every 4 (four) hours as needed for severe pain. 90 tablet 0    Thiamine HCl (VITAMIN B-1 PO) Take 100 mg by mouth daily.     colchicine 0.6 MG tablet Take 0.6 mg by mouth daily. SMARTSIG:1 Tablet(s) By Mouth Daily (Patient not taking: Reported on 12/27/2020)     No current facility-administered medications for this visit.   Facility-Administered Medications Ordered in Other Visits  Medication Dose Route Frequency Provider Last Rate Last Admin   sodium chloride flush (NS) 0.9 % injection 10 mL  10 mL Intravenous PRN Earlie Server, MD   10 mL at 03/01/20 0812     PHYSICAL EXAMINATION: ECOG PERFORMANCE STATUS: 1 - Symptomatic but completely ambulatory Vitals:   12/27/20 1347  BP: 131/81  Pulse: 71  Resp: 18  Temp: 97.8 F (36.6 C)   Filed Weights  12/27/20 1347  Weight: 103 lb 8 oz (46.9 kg)    Physical Exam Constitutional:      General: He is not in acute distress. HENT:     Head: Normocephalic and atraumatic.  Eyes:     General: No scleral icterus. Cardiovascular:     Rate and Rhythm: Normal rate and regular rhythm.     Heart sounds: Normal heart sounds.  Pulmonary:     Effort: Pulmonary effort is normal. No respiratory distress.     Breath sounds: No wheezing.  Abdominal:     General: Bowel sounds are normal. There is no distension.     Palpations: Abdomen is soft.  Musculoskeletal:        General: No deformity.     Cervical back: Normal range of motion and neck supple.  Lymphadenopathy:     Cervical: No cervical adenopathy.  Skin:    General: Skin is warm and dry.     Findings: No erythema or rash.  Neurological:     Mental Status: He is alert. Mental status is at baseline.     Cranial Nerves: No cranial nerve deficit.     Coordination: Coordination normal.    LABORATORY DATA:  I have reviewed the data as listed Lab Results  Component Value Date   WBC 7.6 12/27/2020   HGB 11.8 (L) 12/27/2020   HCT 36.7 (L) 12/27/2020   MCV 93.9 12/27/2020   PLT 225 12/27/2020   Recent Labs    11/15/20 0752 12/06/20 0822  12/27/20 1335  NA 138 136 141  K 3.7 3.7 4.1  CL 105 104 108  CO2 $Re'25 25 27  'JbB$ GLUCOSE 124* 100* 117*  BUN $Re'15 16 23  'JUG$ CREATININE 0.85 0.79 0.92  CALCIUM 9.5 9.1 9.1  GFRNONAA >60 >60 >60  PROT 7.0 6.8 7.4  ALBUMIN 4.0 3.7 3.9  AST $Re'18 18 18  'Ruw$ ALT $R'13 14 14  'Up$ ALKPHOS 53 56 61  BILITOT 0.6 0.3 0.6    Iron/TIBC/Ferritin/ %Sat No results found for: IRON, TIBC, FERRITIN, IRONPCTSAT    RADIOGRAPHIC STUDIES: I have personally reviewed the radiological images as listed and agreed with the findings in the report. CT SOFT TISSUE NECK W CONTRAST  Result Date: 12/12/2020 CLINICAL DATA:  80 year old male with a history of oropharyngeal carcinoma, left upper lobe squamous cell lung carcinoma, thoracic spine metastases. EXAM: CT NECK WITH CONTRAST TECHNIQUE: Multidetector CT imaging of the neck was performed using the standard protocol following the bolus administration of intravenous contrast. CONTRAST:  138mL OMNIPAQUE IOHEXOL 300 MG/ML SOLN in conjunction with contrast enhanced imaging of the chest, abdomen, and pelvis reported separately. COMPARISON:  CT Chest, Abdomen, and Pelvis from the same day reported separately. Neck CT 06/29/2020.  PET-CT 05/22/2020. FINDINGS: Pharynx and larynx: Pharyngeal and pharyngeal soft tissue contours are within normal limits. Supraglottic laryngeal soft tissue thickening has regressed since May, might be sequelae of radiation. No discrete hyperenhancement or mass. Retropharyngeal effusion has mildly increased. Parapharyngeal spaces are stable. Salivary glands: Post radiation changes, otherwise negative. Thyroid: Negative. Lymph nodes: Post treatment architectural distortion at the right level 2 nodal station, and nearby cystic right retropharyngeal node (series 1, not significantly changed from May. However, and irregular and enhancing 7-8 mm short axis soft tissue nodule at the right level 2 B station has increased since that time suspicious for progressed metastatic nodal  disease on series 1, image 30. This was hypermetabolic and smaller on the March PET-CT. Right level IIIa nodal station indistinct, ex nodal tumor  on series 1, image 56 appears stable since May, with hypermetabolic in March. Small but increased level 1 lymph nodes are up to 5 mm short axis and indeterminate. Other nodal stations appear stable. Vascular: Chronic segmental right jugular vein thrombosis secondary to the lymph node disease above. Right side Port-A-Cath remains in place. Severe chronic carotid calcified atherosclerosis with high-grade string sign stenosis on the right, but the right ICA remains patent. Right vertebral artery remains patent although is now engulfed by tumor at the C3 level. See series 1, image 36 and osseous details below. Limited intracranial: Stable. Calcified atherosclerosis at the skull base. Visualized orbits: Negative. Mastoids and visualized paranasal sinuses: Clear. Skeleton: New since May destructive osseous lesion involving the right lateral body and posterior elements of C3. See series 7, image 67 and series 1, images 35 and 36. Tumor here encompasses up to 26 mm diameter and engulfs the right vertebral artery which remains patent. The right pedicle and a portion of the facet is eroded. Cervical vertebral alignment is maintained. The right C4 neural foramen is fiilled with tumor and there is mild right lateral epidural tumor. Superimposed cervical spine degeneration including chronic ankylosis of the right C2-C3 facet. Congenital incomplete ossification of the posterior C1 ring. No other osseous metastatic disease identified in the neck. Absent and carious dentition. Chronic T2 right side vertebral body metastasis, see chest CT reported separately. Upper chest: Reported separately. IMPRESSION: 1. Progression of Disease: - new since May right C3 destructive osseous metastasis has eroded the right pedicle and facet, with tumor filling the right C4 neural foramen and engulfing the  right vertebral artery which remains patent. Mild right lateral epidural tumor. - small (8 mm short axis) but progressed right level 2A nodal disease with evidence of extracapsular extension. 2. 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 3. No recurrent laryngeal or pharyngeal tumor identified. 4. Severe carotid atherosclerosis and stenosis greater on the right. Right ICA remains patent. 5. CT Chest, Abdomen, and Pelvis the same day are reported separately. Electronically Signed   By: Genevie Ann M.D.   On: 12/12/2020 08:59   CT CHEST ABDOMEN PELVIS W CONTRAST  Result Date: 12/12/2020 CLINICAL DATA:  Follow-up left upper lobe squamous cell carcinoma, metastatic oropharyngeal carcinoma, restaging EXAM: CT CHEST, ABDOMEN, AND PELVIS WITH CONTRAST TECHNIQUE: Multidetector CT imaging of the chest, abdomen and pelvis was performed following the standard protocol during bolus administration of intravenous contrast. CONTRAST:  122mL OMNIPAQUE IOHEXOL 300 MG/ML  SOLN COMPARISON:  PET-CT, 05/22/2020 FINDINGS: CT CHEST FINDINGS Cardiovascular: Right chest port catheter. Aortic atherosclerosis. Normal heart size. Extensive 3 vessel coronary artery calcifications and/or stents status post CABG. No pericardial effusion. Mediastinum/Nodes: No enlarged mediastinal, hilar, or axillary lymph nodes. Thyroid gland, trachea, and esophagus demonstrate no significant findings. Lungs/Pleura: Expected interval evolution of radiation fibrosis about a treated nodule of the peripheral left upper lobe, measuring approximately 2.7 x 2.3 cm, not significantly changed (series 7, image 59). Bilateral calcified pleural plaques. No pleural effusion or pneumothorax. Musculoskeletal: No chest wall mass. Redemonstrated lytic lesion of the right aspect of T2 (series 9, image 128) as well as the left scapula near the coronoid process and glenoid  (series 7, image 10). CT ABDOMEN PELVIS FINDINGS Hepatobiliary: No solid liver abnormality is seen. No gallstones, gallbladder wall thickening, or biliary dilatation. Pancreas: Unremarkable. No pancreatic ductal dilatation or surrounding inflammatory changes. Spleen: Normal in size without significant abnormality.  Adrenals/Urinary Tract: Adrenal glands are unremarkable. Kidneys are normal, without renal calculi, solid lesion, or hydronephrosis. Bladder is unremarkable. Stomach/Bowel: Stomach is within normal limits. Appendix appears normal. No evidence of bowel wall thickening, distention, or inflammatory changes. Sigmoid diverticula. Vascular/Lymphatic: Aortic atherosclerosis. No enlarged abdominal or pelvic lymph nodes. Reproductive: Prostate brachytherapy pellets. Other: No abdominal wall hernia or abnormality. No abdominopelvic ascites. Musculoskeletal: No acute or significant osseous findings. IMPRESSION: 1. Expected interval evolution of radiation fibrosis about a treated nodule of the peripheral left upper lobe, measuring approximately 2.7 x 2.3 cm, not significantly changed. 2. Redemonstrated lytic osseous metastatic lesions of the right aspect of T2 as well as the left scapula. No new lesions identified by CT. 3. No evidence of metastatic disease in the abdomen or pelvis. 4. Prostate brachytherapy. 5. Coronary artery disease. 6. Sigmoid diverticulosis. Aortic Atherosclerosis (ICD10-I70.0). Electronically Signed   By: Delanna Ahmadi M.D.   On: 12/12/2020 10:46    ASSESSMENT & PLAN:  1. Squamous cell carcinoma of left lung ([place])   2. 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 Hypomagnesemia   5. Weight loss   6. 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  New C3 destructive metastatic disease Small but progressed right level 2A nodal disease with evidence of extracapsular extension.  Discussed about switching to other chemotheapy/target therapy options.  Patient previously did not tolerate chemotherapy well and is not interested in chemo.  We discussed about rationale and potential side effects of Cetuximab +/- Taxane. Patient and family are concern about side effects.  I discussed with Radonc [name] who will see patient to see if any additional RT is feasible.  Hold Beryle Flock for now If able to be radiated, will continue [place] and consider switch regimen if he develops further progression.   #Hypomagnesia, continue oral Slow-Mag, continue 1 tablet twice daily.    #Left upper lobe squamous cell carcinoma, stage I Finished SBRT on 02/07/2020.  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  We will decide duration of anticoagulation after reimaging.  #Weight loss, weight has been stable.  Continue nutrition  supplementation.    all questions were answered. The patient knows to call the clinic with any problems questions or concerns.   Return of visit: TBD, I ask wife to call office and update [place] patient's radiation schedule. Plan to see him 2 weeks after RT.  Talbert Cage  Tasia Catchings, MD, PhD 12/27/2020

## 2020-12-29 ENCOUNTER — Encounter: Payer: Self-pay | Admitting: Radiation Oncology

## 2020-12-29 ENCOUNTER — Ambulatory Visit
Admission: RE | Admit: 2020-12-29 | Discharge: 2020-12-29 | Disposition: A | Discharge status: Home / Self Care | Payer: Medicare Other | Source: Ambulatory Visit | Attending: Radiation Oncology | Admitting: Radiation Oncology

## 2020-12-29 ENCOUNTER — Other Ambulatory Visit: Payer: Self-pay

## 2020-12-29 VITALS — BP 156/98 | HR 69 | Temp 98.4°F | Resp 16 | Wt 104.3 lb

## 2020-12-29 DIAGNOSIS — C01 Malignant neoplasm of base of tongue: Secondary | ICD-10-CM | POA: Insufficient documentation

## 2020-12-29 DIAGNOSIS — C7839 Secondary malignant neoplasm of other respiratory organs: Secondary | ICD-10-CM | POA: Insufficient documentation

## 2020-12-29 DIAGNOSIS — Z923 Personal history of irradiation: Secondary | ICD-10-CM | POA: Diagnosis not present

## 2020-12-29 DIAGNOSIS — C109 Malignant neoplasm of oropharynx, unspecified: Secondary | ICD-10-CM

## 2020-12-29 DIAGNOSIS — C7951 Secondary malignant neoplasm of bone: Secondary | ICD-10-CM | POA: Insufficient documentation

## 2020-12-29 NOTE — Progress Notes (Signed)
Radiation Oncology Follow up Note  Name: Gregory Burton   Date:   12/29/2020 MRN:  [id number] DOB: [birth year]    This 80 y.o. 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.Marland Kitchen  REFERRING PROVIDER: Casilda Carls, MD  HPI: Patient is an 80 year old male whose had multiple treatments in our department including SBRT to a left upper lobe squamous cell carcinoma as well as multiple treatments to his head and neck for stage IV squamous cell carcinoma the tongue base.Marland Kitchen  He has been treated with Beryle Flock although recently having increasing pain in his right neck and an recent CT scan shows C3 destructive osseous metastatic disease eroding the pedicle and facet with tumor filling the right C4 neuralgia neuroforamina engulfing the right vertebral artery which remains patent.  I have asked to evaluate him for possible further palliative treatment.Marland Kitchen  He is having considerable pain in the right neck no problems with rotation of his neck.  COMPLICATIONS OF TREATMENT: none  FOLLOW UP COMPLIANCE: keeps appointments   PHYSICAL EXAM:  BP (!) 156/98   Pulse 69   Temp 98.4 F (36.9 C) (Tympanic)   Resp 16   Wt 104 lb 4.8 oz (47.3 kg)   BMI 15.40 kg/m  Range of motion of his neck does not elicit pain.  Patient is a bit has ability to shrug his shoulders.  No palpable masses noted although the neck is distinctively fibrotic based on prior radiation therapy.  RADIOLOGY RESULTS: CT scans of chest abdomen pelvis and neck all reviewed compatible with above-stated findings  PLAN: At this time elect to go ahead with palliative radiation therapy would plan on delivering 20 Gray in 5 fractions.  We will know better how much further radiation I can deliver based on those accumulation studies done at the time after CT simulation.  Risks and benefits of treatment including rear radiating previously  radiated treatment and possible dysphagia skin reaction fatigue all were reviewed in detail with the patient and his wife.  They both comprehend my treatment plan well.  I personally set up and ordered CT simulation for early next week.  I would like to take this opportunity to thank you for allowing me to participate in the care of your patient.Noreene Filbert, MD

## 2021-01-02 ENCOUNTER — Inpatient Hospital Stay: Payer: Medicare Other

## 2021-01-02 ENCOUNTER — Other Ambulatory Visit: Payer: Self-pay

## 2021-01-02 ENCOUNTER — Ambulatory Visit
Admission: RE | Admit: 2021-01-02 | Discharge: 2021-01-02 | Disposition: A | Discharge status: Home / Self Care | Payer: Medicare Other | Source: Ambulatory Visit | Attending: Radiation Oncology | Admitting: Radiation Oncology

## 2021-01-02 DIAGNOSIS — C7951 Secondary malignant neoplasm of bone: Secondary | ICD-10-CM | POA: Diagnosis present

## 2021-01-02 DIAGNOSIS — Z510 Encounter for antineoplastic radiation therapy: Secondary | ICD-10-CM | POA: Diagnosis present

## 2021-01-02 DIAGNOSIS — C01 Malignant neoplasm of base of tongue: Secondary | ICD-10-CM | POA: Insufficient documentation

## 2021-01-08 DIAGNOSIS — Z510 Encounter for antineoplastic radiation therapy: Secondary | ICD-10-CM | POA: Diagnosis not present

## 2021-01-09 ENCOUNTER — Ambulatory Visit
Admission: RE | Admit: 2021-01-09 | Discharge: 2021-01-09 | Disposition: A | Discharge status: Home / Self Care | Payer: Medicare Other | Source: Ambulatory Visit | Attending: Radiation Oncology | Admitting: Radiation Oncology

## 2021-01-09 ENCOUNTER — Inpatient Hospital Stay: Payer: Medicare Other

## 2021-01-09 DIAGNOSIS — Z510 Encounter for antineoplastic radiation therapy: Secondary | ICD-10-CM | POA: Diagnosis not present

## 2021-01-10 ENCOUNTER — Other Ambulatory Visit: Payer: Self-pay

## 2021-01-10 ENCOUNTER — Inpatient Hospital Stay: Payer: Medicare Other

## 2021-01-10 ENCOUNTER — Ambulatory Visit
Admission: RE | Admit: 2021-01-10 | Discharge: 2021-01-10 | Disposition: A | Discharge status: Home / Self Care | Payer: Medicare Other | Source: Ambulatory Visit | Attending: Radiation Oncology | Admitting: Radiation Oncology

## 2021-01-10 DIAGNOSIS — Z510 Encounter for antineoplastic radiation therapy: Secondary | ICD-10-CM | POA: Diagnosis not present

## 2021-01-11 ENCOUNTER — Ambulatory Visit: Payer: Medicare Other

## 2021-01-11 ENCOUNTER — Other Ambulatory Visit: Payer: Self-pay

## 2021-01-11 ENCOUNTER — Emergency Department
Admission: EM | Admit: 2021-01-11 | Discharge: 2021-01-11 | Disposition: A | Discharge status: Home / Self Care | Payer: Medicare Other | Attending: Emergency Medicine | Admitting: Emergency Medicine

## 2021-01-11 ENCOUNTER — Emergency Department: Payer: Medicare Other

## 2021-01-11 ENCOUNTER — Encounter: Payer: Self-pay | Admitting: Emergency Medicine

## 2021-01-11 ENCOUNTER — Inpatient Hospital Stay: Payer: Medicare Other

## 2021-01-11 DIAGNOSIS — E876 Hypokalemia: Secondary | ICD-10-CM | POA: Diagnosis not present

## 2021-01-11 DIAGNOSIS — Z85818 Personal history of malignant neoplasm of other sites of lip, oral cavity, and pharynx: Secondary | ICD-10-CM | POA: Diagnosis not present

## 2021-01-11 DIAGNOSIS — Z7901 Long term (current) use of anticoagulants: Secondary | ICD-10-CM | POA: Insufficient documentation

## 2021-01-11 DIAGNOSIS — R531 Weakness: Secondary | ICD-10-CM | POA: Insufficient documentation

## 2021-01-11 DIAGNOSIS — Z8546 Personal history of malignant neoplasm of prostate: Secondary | ICD-10-CM | POA: Insufficient documentation

## 2021-01-11 DIAGNOSIS — Z8521 Personal history of malignant neoplasm of larynx: Secondary | ICD-10-CM | POA: Diagnosis not present

## 2021-01-11 DIAGNOSIS — F0390 Unspecified dementia without behavioral disturbance: Secondary | ICD-10-CM | POA: Insufficient documentation

## 2021-01-11 DIAGNOSIS — Z87891 Personal history of nicotine dependence: Secondary | ICD-10-CM | POA: Insufficient documentation

## 2021-01-11 DIAGNOSIS — I10 Essential (primary) hypertension: Secondary | ICD-10-CM | POA: Insufficient documentation

## 2021-01-11 DIAGNOSIS — Z85118 Personal history of other malignant neoplasm of bronchus and lung: Secondary | ICD-10-CM | POA: Diagnosis not present

## 2021-01-11 LAB — CBC
HCT: 34.8 % — ABNORMAL LOW (ref 39.0–52.0)
Hemoglobin: 11.2 g/dL — ABNORMAL LOW (ref 13.0–17.0)
MCH: 29.9 pg (ref 26.0–34.0)
MCHC: 32.2 g/dL (ref 30.0–36.0)
MCV: 92.8 fL (ref 80.0–100.0)
Platelets: 221 10*3/uL (ref 150–400)
RBC: 3.75 MIL/uL — ABNORMAL LOW (ref 4.22–5.81)
RDW: 12.4 % (ref 11.5–15.5)
WBC: 6.4 10*3/uL (ref 4.0–10.5)
nRBC: 0 % (ref 0.0–0.2)

## 2021-01-11 LAB — URINALYSIS, ROUTINE W REFLEX MICROSCOPIC
Bilirubin Urine: NEGATIVE
Glucose, UA: NEGATIVE mg/dL
Hgb urine dipstick: NEGATIVE
Ketones, ur: NEGATIVE mg/dL
Leukocytes,Ua: NEGATIVE
Nitrite: NEGATIVE
Protein, ur: NEGATIVE mg/dL
Specific Gravity, Urine: 1.021 (ref 1.005–1.030)
pH: 5 (ref 5.0–8.0)

## 2021-01-11 LAB — CBC WITH DIFFERENTIAL/PLATELET
Abs Immature Granulocytes: 0.02 10*3/uL (ref 0.00–0.07)
Basophils Absolute: 0 10*3/uL (ref 0.0–0.1)
Basophils Relative: 0 %
Eosinophils Absolute: 0.1 10*3/uL (ref 0.0–0.5)
Eosinophils Relative: 1 %
HCT: 34.5 % — ABNORMAL LOW (ref 39.0–52.0)
Hemoglobin: 11.2 g/dL — ABNORMAL LOW (ref 13.0–17.0)
Immature Granulocytes: 0 %
Lymphocytes Relative: 14 %
Lymphs Abs: 0.8 10*3/uL (ref 0.7–4.0)
MCH: 30.1 pg (ref 26.0–34.0)
MCHC: 32.5 g/dL (ref 30.0–36.0)
MCV: 92.7 fL (ref 80.0–100.0)
Monocytes Absolute: 0.5 10*3/uL (ref 0.1–1.0)
Monocytes Relative: 9 %
Neutro Abs: 4.6 10*3/uL (ref 1.7–7.7)
Neutrophils Relative %: 76 %
Platelets: 196 10*3/uL (ref 150–400)
RBC: 3.72 MIL/uL — ABNORMAL LOW (ref 4.22–5.81)
RDW: 12.3 % (ref 11.5–15.5)
WBC: 6 10*3/uL (ref 4.0–10.5)
nRBC: 0 % (ref 0.0–0.2)

## 2021-01-11 LAB — BASIC METABOLIC PANEL
Anion gap: UNDETERMINED (ref 5–15)
Anion gap: 6 (ref 5–15)
BUN: UNDETERMINED mg/dL (ref 8–23)
BUN: 14 mg/dL (ref 8–23)
CO2: UNDETERMINED mmol/L (ref 22–32)
CO2: 24 mmol/L (ref 22–32)
Calcium: UNDETERMINED mg/dL (ref 8.9–10.3)
Calcium: 9.2 mg/dL (ref 8.9–10.3)
Chloride: UNDETERMINED mmol/L (ref 98–111)
Chloride: 105 mmol/L (ref 98–111)
Creatinine, Ser: UNDETERMINED mg/dL (ref 0.61–1.24)
Creatinine, Ser: 0.72 mg/dL (ref 0.61–1.24)
GFR, Estimated: UNDETERMINED mL/min (ref >60)
GFR, Estimated: >60 mL/min (ref >60)
Glucose, Bld: UNDETERMINED mg/dL (ref 70–99)
Glucose, Bld: 85 mg/dL (ref 70–99)
Potassium: UNDETERMINED mmol/L (ref 3.5–5.1)
Potassium: 4.8 mmol/L (ref 3.5–5.1)
Sodium: UNDETERMINED mmol/L (ref 135–145)
Sodium: 135 mmol/L (ref 135–145)

## 2021-01-11 LAB — HEPATIC FUNCTION PANEL
ALT: 8 U/L (ref 0–44)
AST: 9 U/L — ABNORMAL LOW (ref 15–41)
Albumin: 2 g/dL — ABNORMAL LOW (ref 3.5–5.0)
Alkaline Phosphatase: 30 U/L — ABNORMAL LOW (ref 38–126)
Bilirubin, Direct: <0.1 mg/dL (ref 0.0–0.2)
Total Bilirubin: 0.5 mg/dL (ref 0.3–1.2)
Total Protein: 3.8 g/dL — ABNORMAL LOW (ref 6.5–8.1)

## 2021-01-11 LAB — PROCALCITONIN: Procalcitonin: <0.1 ng/mL

## 2021-01-11 LAB — MAGNESIUM
Magnesium: 1.1 mg/dL — ABNORMAL LOW (ref 1.7–2.4)
Magnesium: 4.2 mg/dL — ABNORMAL HIGH (ref 1.7–2.4)

## 2021-01-11 LAB — TROPONIN I (HIGH SENSITIVITY)
Troponin I (High Sensitivity): 5 ng/L (ref <18)
Troponin I (High Sensitivity): 7 ng/L (ref <18)

## 2021-01-11 LAB — TSH: TSH: 1.086 u[IU]/mL (ref 0.350–4.500)

## 2021-01-11 IMAGING — DX DG CHEST 1V PORT
1 series · 1 of 1 positions shown · non-contrast
Comparison: [DATE].  CT [DATE].

CLINICAL DATA: Weakness.  Stage IV cancer.

EXAM:
PORTABLE CHEST 1 VIEW

[chest ap]
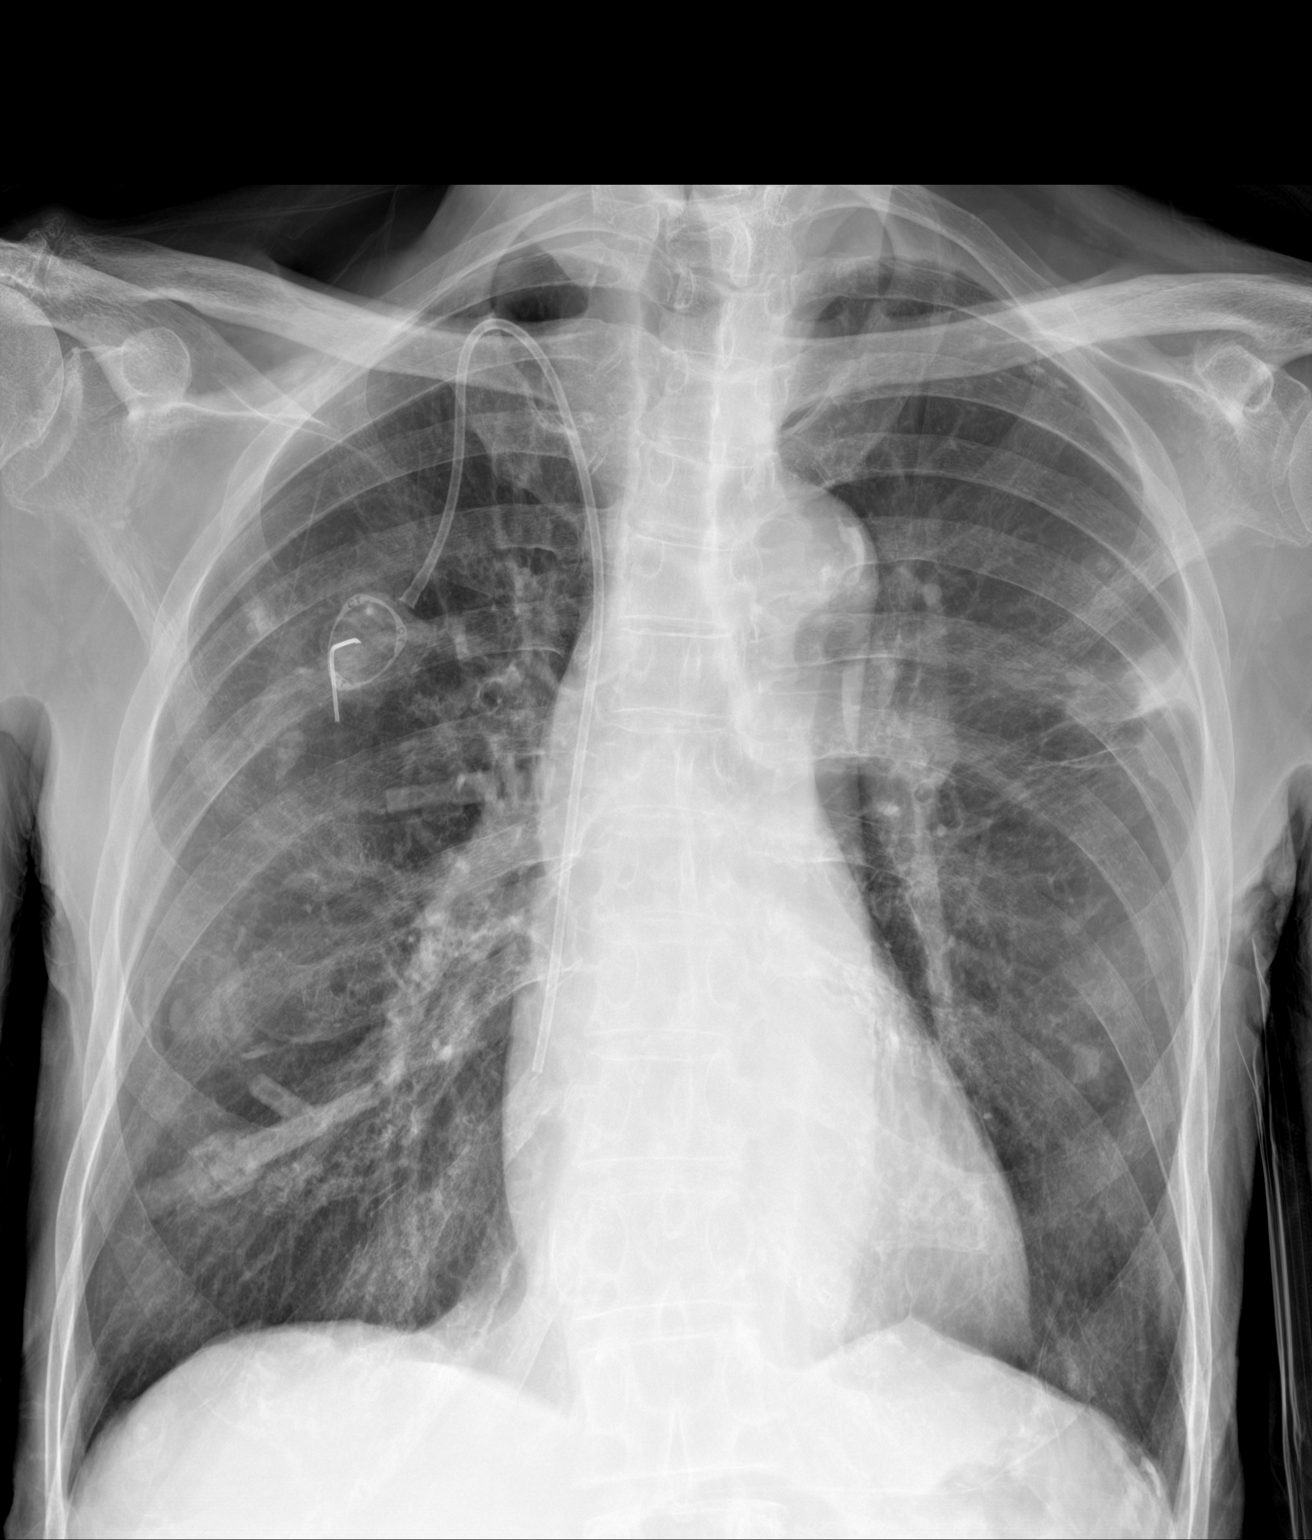

[1 of 1 positions shown; findings below may reference images not displayed]

FINDINGS: Power port on the right with tip in the proximal right atrium. Focal
density in the left lateral chest related to previous mass with
subsequent radiation change. See results of CT scan [DATE].
Question mild patchy infiltrate in the right lower lobe. Bilateral
nipple shadows and bilateral pleural calcifications remain visible.
IMPRESSION: Focal density in the left lateral midlung related to tumor with
previous radiation treatment change.

Question mild patchy pneumonia at the right lung base.

Bilateral nipple shadows and pleural calcifications as seen
previously.

## 2021-01-11 MED ORDER — HEPARIN SOD (PORK) LOCK FLUSH 1 UNIT/ML IV SOLN: 0.5000 mL | INTRAVENOUS | Status: DC | PRN

## 2021-01-11 MED ORDER — POTASSIUM CHLORIDE 20 MEQ PO PACK
40.0000 meq | PACK | Freq: Every day | ORAL | Status: DC
  Administered 2021-01-11: 13:00:00 40 meq via ORAL
  Filled 2021-01-11: qty 2

## 2021-01-11 MED ORDER — HEPARIN SOD (PORK) LOCK FLUSH 100 UNIT/ML IV SOLN
INTRAVENOUS | Status: AC
  Administered 2021-01-11: 16:00:00 500 [IU] via INTRAVENOUS
  Filled 2021-01-11: qty 5

## 2021-01-11 MED ORDER — MAGNESIUM SULFATE 2 GM/50ML IV SOLN
2.0000 g | Freq: Once | INTRAVENOUS | Status: AC
  Administered 2021-01-11: 13:00:00 2 g via INTRAVENOUS
  Filled 2021-01-11: qty 50

## 2021-01-11 MED ORDER — HEPARIN SOD (PORK) LOCK FLUSH 100 UNIT/ML IV SOLN: 500.0000 [IU] | Freq: Once | INTRAVENOUS | Status: AC

## 2021-01-11 NOTE — Discharge Instructions (Signed)
Please continue to eat as much as you can.  Milkshakes or Ensure or ice cream or may be even tapioca pudding or something similar.  If you wants some meat and cannot swallow pieces easily you can try some baby food.  I tried that in the past myself and it takes fairly decent.  Please return if you are any worse and please do your best to do the radiation therapy tomorrow as planned.

## 2021-01-11 NOTE — ED Provider Notes (Signed)
[facility] Emergency Department Provider Note   ____________________________________________   None    (approximate)  I have reviewed the triage vital signs and the nursing notes.   HISTORY  Chief Complaint Weakness    HPI Gregory Burton is a 80 y.o. male patient has stage IV cancer.  He is getting radiation patient therapy from Dr. Donella Stade.  He was post to have his third dose today.  He got up and felt very weak and did not feel like he could make it.  He is complaining of pain in his right back and making his legs weak.  He says its been going on for months.  His oncologist know about it.  He is not running a fever.  He is not coughing.  He just felt very weak and tired could not make it to the cancer center.        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   Neoplasm related pain 05/26/2020   Hypomagnesemia 05/26/2020   Dysuria 11/15/2019   Encounter for antineoplastic chemotherapy 08/11/2019   Weight loss 08/11/2019   Non-intractable vomiting 08/11/2019   Squamous cell carcinoma of left lung ([place]) 08/04/2019   Goals of care, counseling/discussion 06/23/2019   Encounter for audiology evaluation 06/23/2019   Squamous cell lung cancer ([place]) 06/23/2019   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 Authorizing Provider  atorvastatin (LIPITOR) 20 MG tablet Take 20 mg by mouth daily.    [provider]  colchicine 0.6 MG tablet Take 0.6 mg by mouth daily. SMARTSIG:1 Tablet(s) By Mouth Daily 11/21/20   [provider]  donepezil (ARICEPT) 10 MG tablet Take 10 mg by mouth at bedtime.    [provider]  dorzolamide-timolol (COSOPT) 22.3-6.8 MG/ML ophthalmic solution Place 1 drop into both eyes 2 (two) times daily.     [provider]  ELIQUIS 2.5 MG TABS tablet TAKE 1 TABLET BY MOUTH TWICE A DAY 10/30/20   Earlie Server, MD  folic acid (FOLVITE) 1 MG tablet Take 1 mg by mouth daily.    [provider]  latanoprost (XALATAN) 0.005 % ophthalmic solution Place 1 drop into both eyes at bedtime.     [provider]  lidocaine-prilocaine (EMLA) cream Apply 1 application topically as needed. Apply small amount to port site approx 1-2 hours prior to appointment. 08/23/20  Earlie Server, MD  MAG64 64 MG TBEC TAKE 1 TABLET (64 MG TOTAL) BY MOUTH 2 (TWO) TIMES DAILY. 11/01/20   Earlie Server, MD  magic mouthwash w/lidocaine SOLN Take 5 mLs by mouth 3 (three) times daily as needed for mouth pain. 10/13/20   Jacquelin Hawking, NP  megestrol (MEGACE) 40 MG tablet TAKE 1 TABLET BY MOUTH TWICE A DAY 10/23/20   Earlie Server, MD  Multiple Vitamins-Minerals (CENTRUM ADULTS PO) Take by mouth.    [provider]  nystatin (MYCOSTATIN) 100000 UNIT/ML suspension TAKE 5 MLS (500,000 UNITS TOTAL) BY MOUTH 4 (FOUR) TIMES DAILY. 11/01/20   Earlie Server, MD  oxyCODONE (OXY IR/ROXICODONE) 5 MG immediate release tablet  Take 1 tablet (5 mg total) by mouth every 4 (four) hours as needed for severe pain. 05/08/20   Verlon Au, NP  Thiamine HCl (VITAMIN B-1 PO) Take 100 mg by mouth daily.    [provider]    Allergies Enalapril  No family history on file.  Social History Social History   Tobacco Use   Smoking status: Former    Years: 21.00    Types: Cigarettes    Quit date: 05/11/2008    Years since quitting: 12.6   Smokeless tobacco: Never  Vaping Use   Vaping Use: Never used  Substance Use Topics   Alcohol use: Yes    Comment: 0.5 pint liquor a week   Drug use: Never    Review of Systems  Constitutional: No fever/chills Eyes: No visual changes. ENT: No sore throat. Cardiovascular: Denies chest pain. Respiratory: Denies shortness of breath. Gastrointestinal: No abdominal pain.  No nausea, no vomiting.  No diarrhea.  No constipation. Genitourinary: Negative for dysuria. Musculoskeletal: Chronic right sided back pain. Skin: Negative for rash. Neurological: Negative for headaches, focal weakness   ____________________________________________   PHYSICAL EXAM:  VITAL SIGNS: ED Triage Vitals  Enc Vitals Group     BP 01/11/21 0857 137/87     Pulse Rate 01/11/21 0857 71     Resp 01/11/21 0857 16     Temp 01/11/21 0857 97.7 F (36.5 C)     Temp Source 01/11/21 0857 Oral     SpO2 01/11/21 0857 100 %     Weight --      Height --      Head Circumference --      Peak Flow --      Pain Score 01/11/21 0829 0     Pain Loc --      Pain Edu? --      Excl. in [place]? --     Constitutional: Alert and oriented.  Cachectic and tired looking Eyes: Conjunctivae are normal. . Head: Atraumatic. Nose: No congestion/rhinnorhea. Mouth/Throat: Mucous membranes are moist.  Oropharynx non-erythematous. Neck: No stridor.  Cardiovascular: Normal rate, regular rhythm. Grossly normal heart sounds.  Good peripheral circulation. Respiratory: Normal respiratory effort.  No retractions. Lungs  CTAB. Gastrointestinal: Soft and nontender. No distention. No abdominal bruits.  Musculoskeletal: No lower extremity tenderness nor edema.  No spinal tenderness Neurologic:  Normal speech and language. No gross focal neurologic deficits are appreciated.  Patient has good motor strength in arms and legs. Skin:  Skin is warm, dry and intact. No rash noted.   ____________________________________________   LABS (all labs ordered are listed, but only abnormal results are displayed)  Labs Reviewed  CBC - Abnormal; Notable for the following components:      Result Value   RBC 3.75 (*)  Hemoglobin 11.2 (*)    HCT 34.8 (*)    All other components within normal limits  URINALYSIS, ROUTINE W REFLEX MICROSCOPIC - Abnormal; Notable for the following components:   Color, Urine YELLOW (*)    APPearance CLEAR (*)    All other components within normal limits  HEPATIC FUNCTION PANEL - Abnormal; Notable for the following components:   Total Protein 3.8 (*)    Albumin 2.0 (*)    AST 9 (*)    Alkaline Phosphatase 30 (*)    All other components within normal limits  MAGNESIUM - Abnormal; Notable for the following components:   Magnesium 1.1 (*)    All other components within normal limits  CBC WITH DIFFERENTIAL/PLATELET - Abnormal; Notable for the following components:   RBC 3.72 (*)    Hemoglobin 11.2 (*)    HCT 34.5 (*)    All other components within normal limits  MAGNESIUM - Abnormal; Notable for the following components:   Magnesium 4.2 (*)    All other components within normal limits  BASIC METABOLIC PANEL  TSH  PROCALCITONIN  BASIC METABOLIC PANEL  CALCIUM, IONIZED  CBG MONITORING, ED  TROPONIN I (HIGH SENSITIVITY)  TROPONIN I (HIGH SENSITIVITY)   ____________________________________________  EKG  EKG read interpreted by me shows normal sinus rhythm rate of 77 normal axis right atrial enlargement no other obvious acute ST-T changes are  seen ____________________________________________  RADIOLOGY Gertha Calkin, personally viewed and evaluated these images (plain radiographs) as part of my medical decision making, as well as reviewing the written report by the radiologist.  ED MD interpretation:    Official radiology report(s): DG Chest Portable 1 View  Result Date: 01/11/2021 CLINICAL DATA:  Weakness.  Stage IV cancer. EXAM: PORTABLE CHEST 1 VIEW COMPARISON:  06/29/2019.  CT 12/11/2020. FINDINGS: Power port on the right with tip in the proximal right atrium. Focal density in the left lateral chest related to previous mass with subsequent radiation change. See results of CT scan 12/11/2020. Question mild patchy infiltrate in the right lower lobe. Bilateral nipple shadows and bilateral pleural calcifications remain visible. IMPRESSION: Focal density in the left lateral midlung related to tumor with previous radiation treatment change. Question mild patchy pneumonia at the right lung base. Bilateral nipple shadows and pleural calcifications as seen previously. Electronically Signed   By: Nelson Chimes M.D.   On: 01/11/2021 11:36    ____________________________________________   PROCEDURES  Procedure(s) performed (including Critical Care):  Procedures   ____________________________________________   INITIAL IMPRESSION / ASSESSMENT AND PLAN / ED COURSE ----------------------------------------- 1:17 PM on 01/11/2021 ----------------------------------------- Patient with hypokalemia and hypomagnesemia.  Also his calcium is low.  This is likely low because of the decreased albumin and serum protein.  I have ordered an ionized calcium to be sure about this.  Patient does not have any facial twitching when I tap of the facial nerves.  He does not have any carpopedal spasm with inflation of blood pressure cuff.  He is getting IV magnesium and then potassium and some more p.o. potassium.  We will repeat his labs after they  are done.    ----------------------------------------- 3:46 PM on 01/11/2021 ----------------------------------------- Patient's calcium magnesium and potassium are all back to normal at the current time.  Patient reports he feels less weak.  He is able to walk without any difficulty.  In fact his wife says he made it to the bathroom by himself.  Radiation therapy is ready to do him tomorrow.  I will let him go at this time.  He can return at any time and he and his wife know that.      Clinical Course as of 01/11/21 1550  Thu Jan 11, 2021  1211 Magnesium(!): 1.1 [PM]    Clinical Course User Index [PM] Nena Polio, MD     ____________________________________________   FINAL CLINICAL IMPRESSION(S) / ED DIAGNOSES  Final diagnoses:  Weakness  Hypocalcemia  Hypokalemia  Hypomagnesemia     ED Discharge Orders     None        Note:  This document was prepared using Dragon voice recognition software and may include unintentional dictation errors.    Nena Polio, MD 01/11/21 1550

## 2021-01-11 NOTE — ED Notes (Signed)
Port was accessed by this Probation officer. Pt tolerated well.

## 2021-01-11 NOTE — ED Notes (Signed)
Patient stable and discharged with all personal belongings and AVS. AVS and discharge instructions reviewed with patient and opportunity for questions provided.   

## 2021-01-11 NOTE — ED Triage Notes (Signed)
Pt to ED via [facility] with c/o weakness, he has stage 4 cancer and was to get treatment today, but felt weaker than normal and wanted to be evaluated. VS with [facility] 149/67 HR 67 99% on room air.

## 2021-01-12 ENCOUNTER — Inpatient Hospital Stay: Payer: Medicare Other

## 2021-01-12 ENCOUNTER — Telehealth: Payer: Self-pay

## 2021-01-12 ENCOUNTER — Ambulatory Visit
Admission: RE | Admit: 2021-01-12 | Discharge: 2021-01-12 | Disposition: A | Discharge status: Home / Self Care | Payer: Medicare Other | Source: Ambulatory Visit | Attending: Radiation Oncology | Admitting: Radiation Oncology

## 2021-01-12 DIAGNOSIS — Z510 Encounter for antineoplastic radiation therapy: Secondary | ICD-10-CM | POA: Diagnosis not present

## 2021-01-12 LAB — CALCIUM, IONIZED: Calcium, Ionized, Serum: 5.4 mg/dL (ref 4.5–5.6)

## 2021-01-12 NOTE — Telephone Encounter (Signed)
Patient will complete XRT on 11/22. Please schedule him to see MD approx 2 weeks after that and notify wife of appt. thanks

## 2021-01-15 ENCOUNTER — Ambulatory Visit
Admission: RE | Admit: 2021-01-15 | Discharge: 2021-01-15 | Disposition: A | Discharge status: Home / Self Care | Payer: Medicare Other | Source: Ambulatory Visit | Attending: Radiation Oncology | Admitting: Radiation Oncology

## 2021-01-15 ENCOUNTER — Ambulatory Visit: Payer: Medicare Other

## 2021-01-15 ENCOUNTER — Encounter: Payer: Self-pay | Admitting: Oncology

## 2021-01-15 ENCOUNTER — Inpatient Hospital Stay: Payer: Medicare Other

## 2021-01-15 DIAGNOSIS — Z510 Encounter for antineoplastic radiation therapy: Secondary | ICD-10-CM | POA: Diagnosis not present

## 2021-01-15 NOTE — Telephone Encounter (Signed)
Done per Benjamine Mola DB

## 2021-01-16 ENCOUNTER — Ambulatory Visit
Admission: RE | Admit: 2021-01-16 | Discharge: 2021-01-16 | Disposition: A | Discharge status: Home / Self Care | Payer: Medicare Other | Source: Ambulatory Visit | Attending: Radiation Oncology | Admitting: Radiation Oncology

## 2021-01-16 DIAGNOSIS — Z510 Encounter for antineoplastic radiation therapy: Secondary | ICD-10-CM | POA: Diagnosis not present

## 2021-01-23 ENCOUNTER — Other Ambulatory Visit: Payer: Self-pay | Admitting: Oncology

## 2021-02-05 ENCOUNTER — Emergency Department: Payer: Medicare Other

## 2021-02-05 ENCOUNTER — Other Ambulatory Visit: Payer: Self-pay

## 2021-02-05 ENCOUNTER — Emergency Department
Admission: EM | Admit: 2021-02-05 | Discharge: 2021-02-05 | Disposition: A | Discharge status: Home / Self Care | Payer: Medicare Other | Attending: Emergency Medicine | Admitting: Emergency Medicine

## 2021-02-05 DIAGNOSIS — W01198A Fall on same level from slipping, tripping and stumbling with subsequent striking against other object, initial encounter: Secondary | ICD-10-CM | POA: Diagnosis not present

## 2021-02-05 DIAGNOSIS — Z87891 Personal history of nicotine dependence: Secondary | ICD-10-CM | POA: Insufficient documentation

## 2021-02-05 DIAGNOSIS — Z85118 Personal history of other malignant neoplasm of bronchus and lung: Secondary | ICD-10-CM | POA: Diagnosis not present

## 2021-02-05 DIAGNOSIS — Z8521 Personal history of malignant neoplasm of larynx: Secondary | ICD-10-CM | POA: Insufficient documentation

## 2021-02-05 DIAGNOSIS — F0390 Unspecified dementia without behavioral disturbance: Secondary | ICD-10-CM | POA: Insufficient documentation

## 2021-02-05 DIAGNOSIS — W19XXXA Unspecified fall, initial encounter: Secondary | ICD-10-CM

## 2021-02-05 DIAGNOSIS — Z043 Encounter for examination and observation following other accident: Secondary | ICD-10-CM | POA: Insufficient documentation

## 2021-02-05 DIAGNOSIS — Z79899 Other long term (current) drug therapy: Secondary | ICD-10-CM | POA: Insufficient documentation

## 2021-02-05 DIAGNOSIS — Z7901 Long term (current) use of anticoagulants: Secondary | ICD-10-CM | POA: Diagnosis not present

## 2021-02-05 DIAGNOSIS — Z8546 Personal history of malignant neoplasm of prostate: Secondary | ICD-10-CM | POA: Diagnosis not present

## 2021-02-05 LAB — BASIC METABOLIC PANEL
Anion gap: 7 (ref 5–15)
BUN: 19 mg/dL (ref 8–23)
CO2: 29 mmol/L (ref 22–32)
Calcium: 9.7 mg/dL (ref 8.9–10.3)
Chloride: 103 mmol/L (ref 98–111)
Creatinine, Ser: 0.91 mg/dL (ref 0.61–1.24)
GFR, Estimated: >60 mL/min (ref >60)
Glucose, Bld: 134 mg/dL — ABNORMAL HIGH (ref 70–99)
Potassium: 4.4 mmol/L (ref 3.5–5.1)
Sodium: 139 mmol/L (ref 135–145)

## 2021-02-05 LAB — CBC
HCT: 34.2 % — ABNORMAL LOW (ref 39.0–52.0)
Hemoglobin: 11.1 g/dL — ABNORMAL LOW (ref 13.0–17.0)
MCH: 29.8 pg (ref 26.0–34.0)
MCHC: 32.5 g/dL (ref 30.0–36.0)
MCV: 91.7 fL (ref 80.0–100.0)
Platelets: 258 10*3/uL (ref 150–400)
RBC: 3.73 MIL/uL — ABNORMAL LOW (ref 4.22–5.81)
RDW: 12.7 % (ref 11.5–15.5)
WBC: 10 10*3/uL (ref 4.0–10.5)
nRBC: 0 % (ref 0.0–0.2)

## 2021-02-05 LAB — MAGNESIUM: Magnesium: 2 mg/dL (ref 1.7–2.4)

## 2021-02-05 IMAGING — CT CT CERVICAL SPINE W/O CM
3 of 4 series · 12 of 35 positions shown, 14 images · non-contrast
Comparison: Neck CT [DATE]

CLINICAL DATA: Fall. Neck trauma. History of or pharyngeal
carcinoma and left upper lobe squamous cell lung carcinoma.

EXAM:
CT CERVICAL SPINE WITHOUT CONTRAST
TECHNIQUE: Multidetector CT imaging of the cervical spine was performed without
intravenous contrast. Multiplanar CT image reconstructions were also
generated.

[Series 2: sagittal bone · sagittal · 0.31mm/px · 5 of 60 slices shown, 6 images]
[im 20/60  bone]
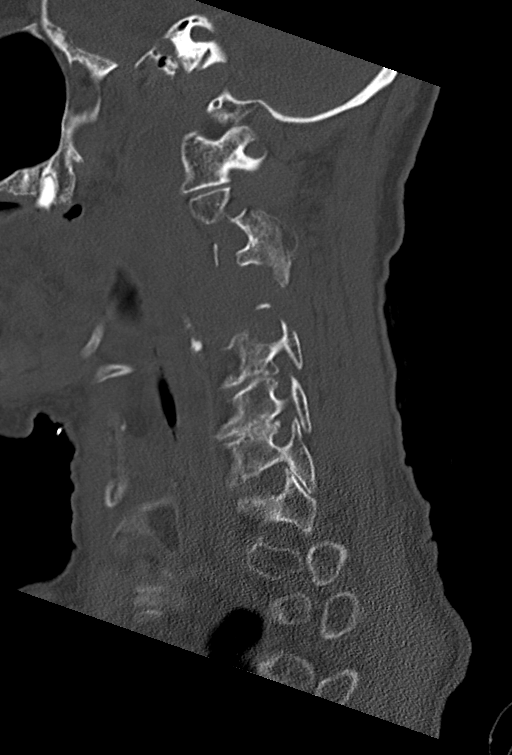
[im 25/60  bone]
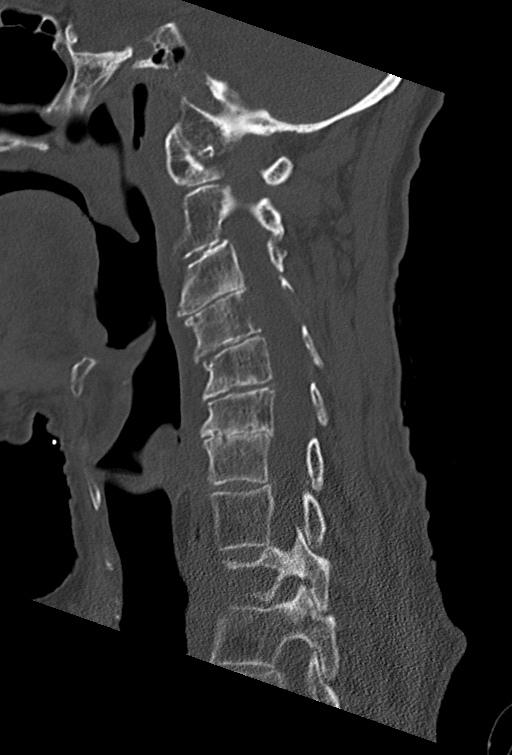
[im 30/60  soft-tissue]
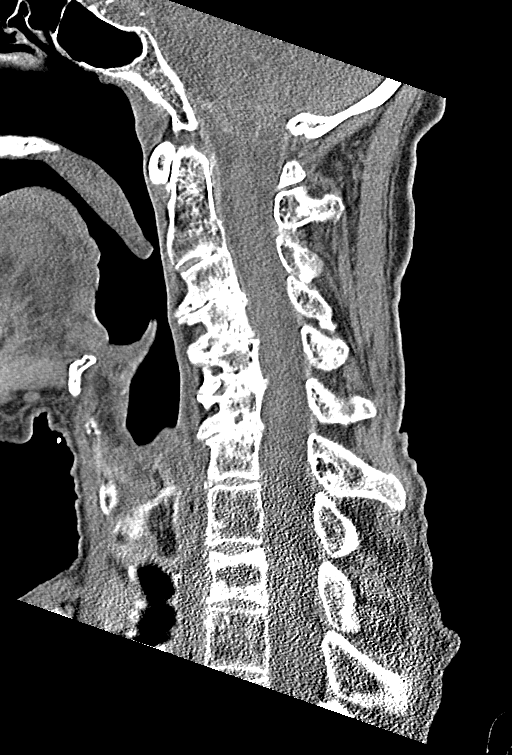
[im 30/60  bone]
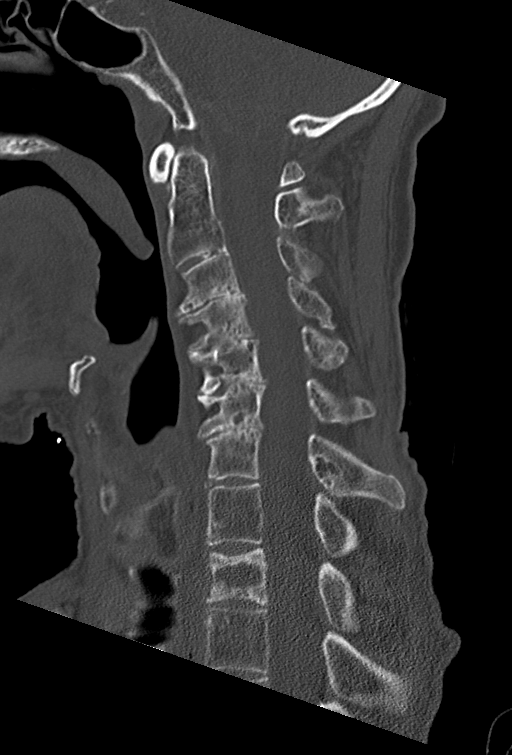
[im 35/60  bone]
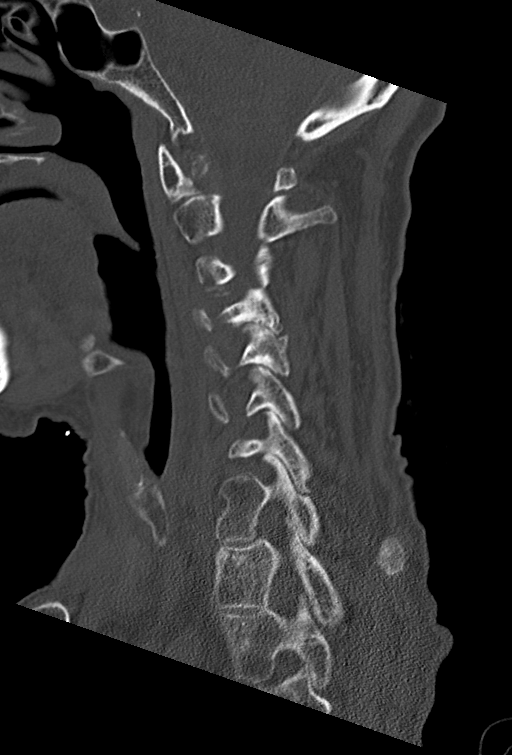
[im 40/60  bone]
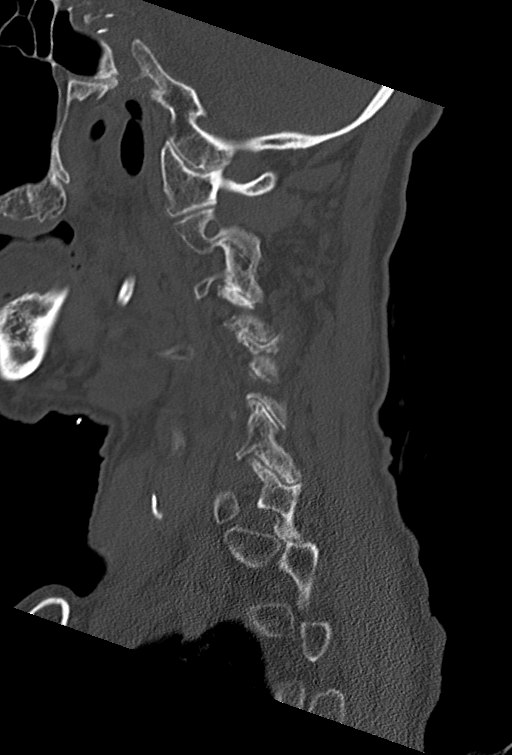

[Series 7: orthogonal bone · axial · 0.27mm/px · z∈[-297,-149]mm · 4 of 116 slices shown, 5 images]
[im 17/116  soft-tissue]
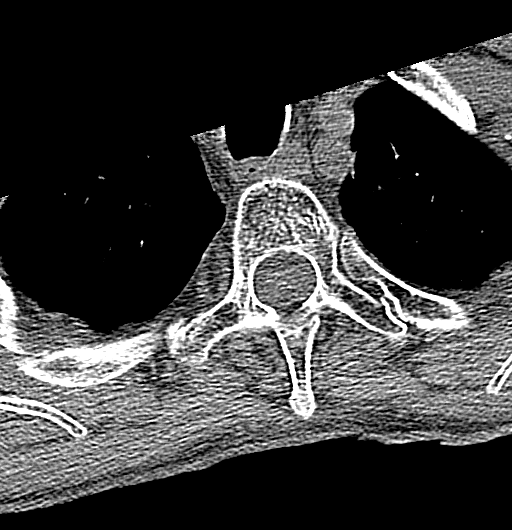
[im 17/116  bone]
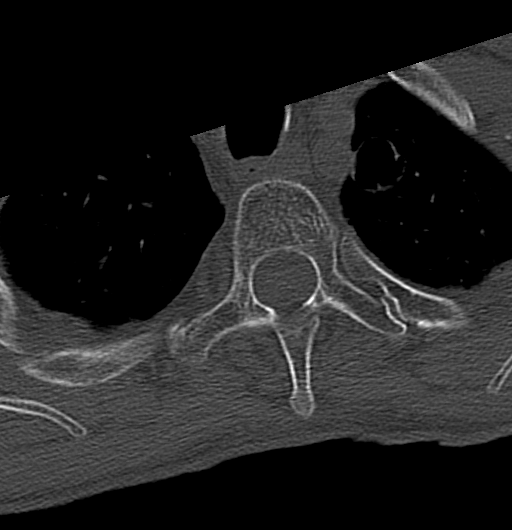
[im 50/116  bone]
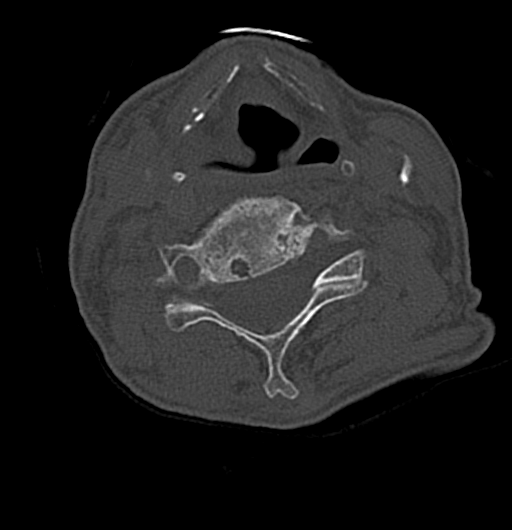
[im 66/116  bone]
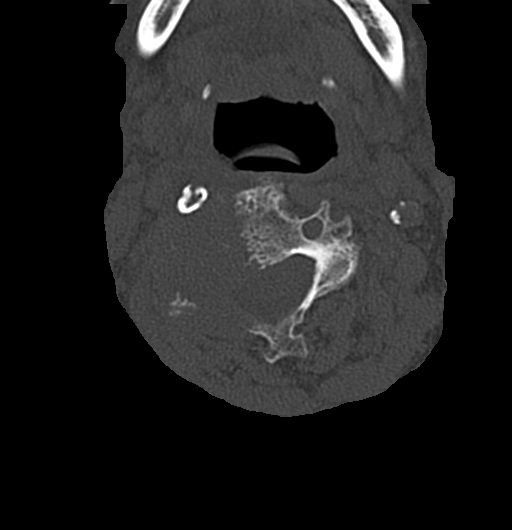
[im 99/116  bone]
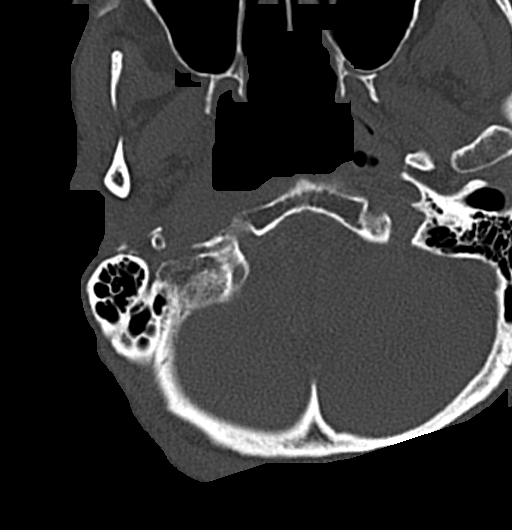

[Series 8: coronal bone · coronal · 0.25mm/px · 3 of 68 slices shown]
[im 14/68  bone]
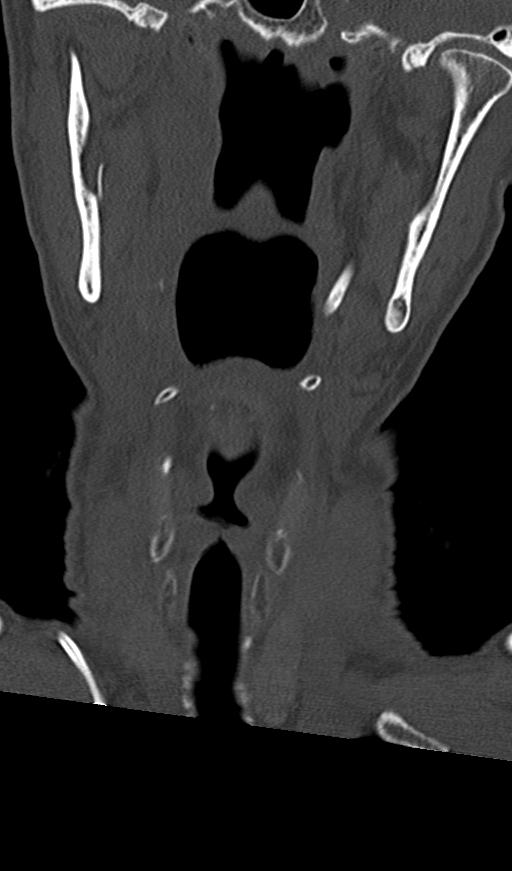
[im 27/68  bone]
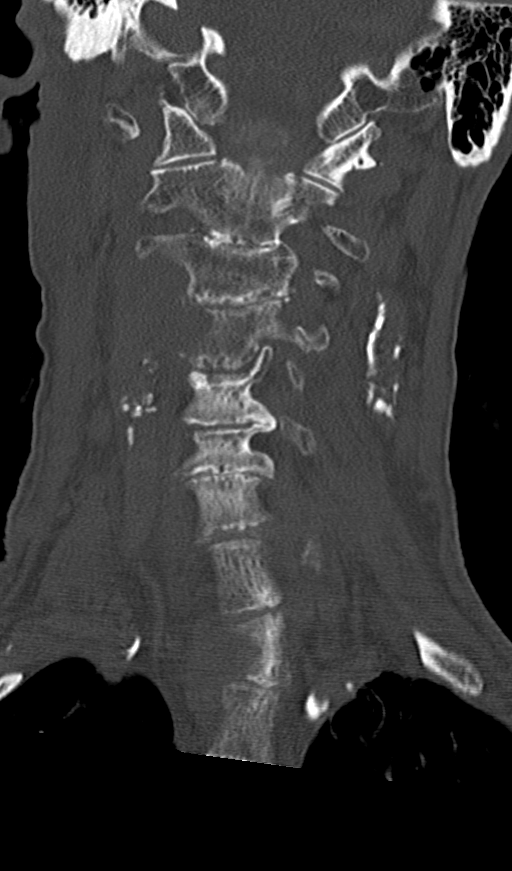
[im 41/68  bone]
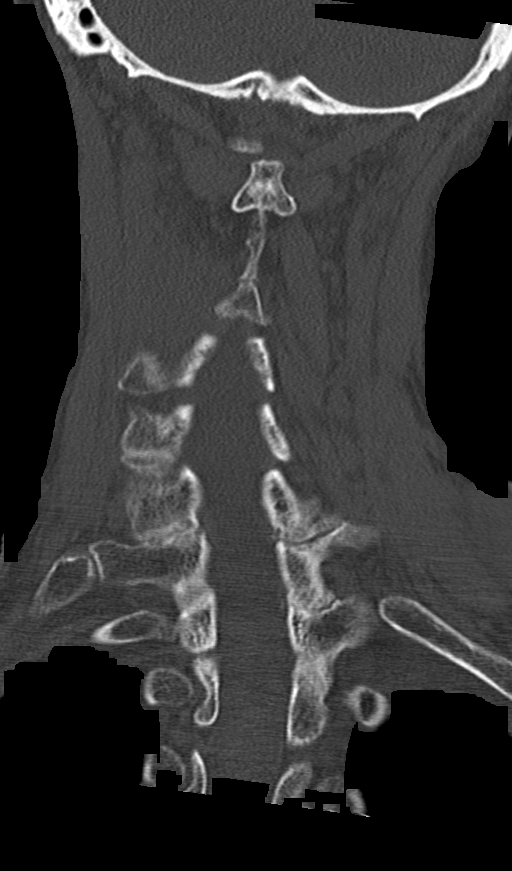

[12 of 35 positions shown; findings below may reference images not displayed]

FINDINGS: Alignment: No subluxation.

Skull base and vertebrae: Destructive lesions in the right side of
the C4 vertebral body and posterior elements and T2 vertebral body
are again noted. T2 is stable. Progressive destruction noted in the
right side of C4 compatible with metastatic disease. No fracture.

Soft tissues and spinal canal: No prevertebral fluid or swelling. No
visible canal hematoma.

Disc levels: Advanced diffuse degenerative disc disease and facet
disease bilaterally.

Upper chest: Cystic area/lesion in the left upper lobe measures 14
mm, stable.

Other: No acute findings
IMPRESSION: Stable destructive process in the right side of the T2 vertebral
body.

Progressive destructive process in the right side and posterior
elements of C4.

Advanced diffuse degenerative disc and facet disease.

No fracture.

## 2021-02-05 IMAGING — CT CT HEAD W/O CM
4 series · 17 of 47 positions shown, 19 images · non-contrast
Comparison: None.

CLINICAL DATA: Fall.  Head trauma, moderate-severe

EXAM:
CT HEAD WITHOUT CONTRAST
TECHNIQUE: Contiguous axial images were obtained from the base of the skull
through the vertex without intravenous contrast.

[Series 2: head wo · axial · 0.43mm/px · z∈[-100,+20]mm · 7 of 33 slices shown, 9 images]
[im 5/33  brain]
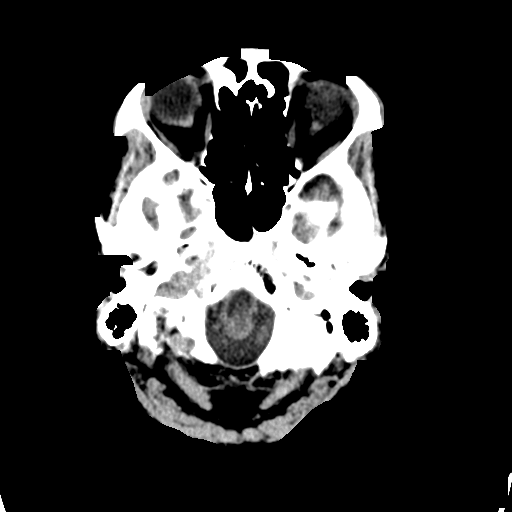
[im 5/33  bone]
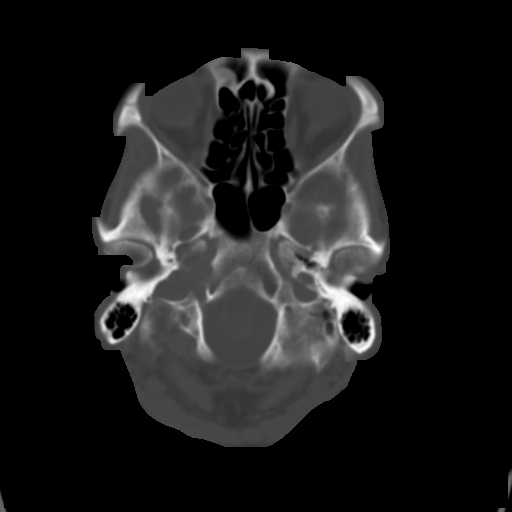
[im 9/33  brain]
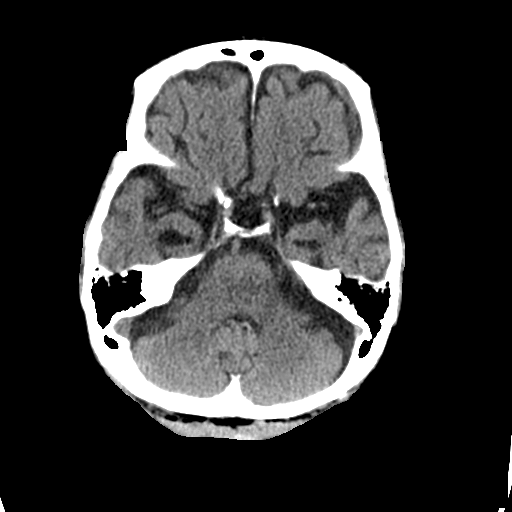
[im 13/33  brain]
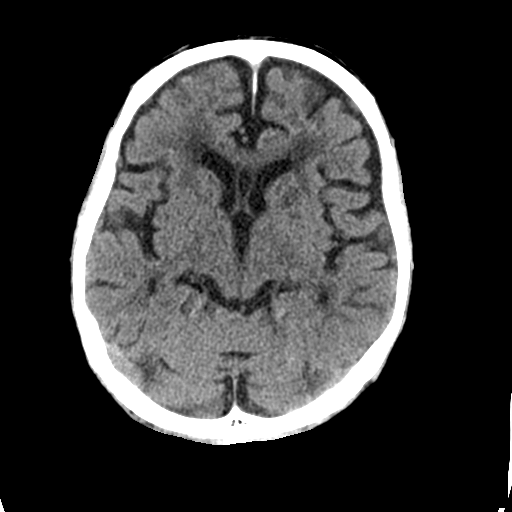
[im 17/33  brain]
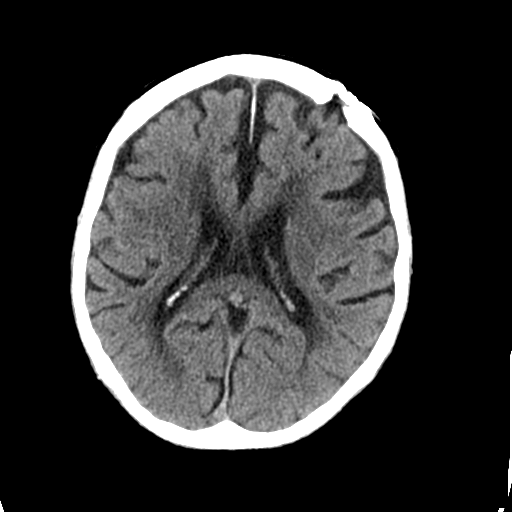
[im 21/33  brain]
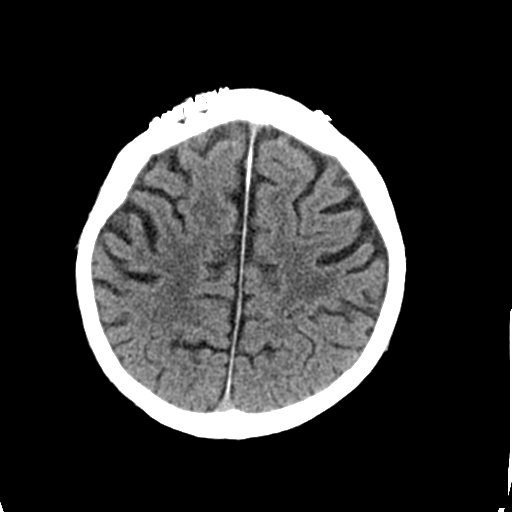
[im 21/33  bone]
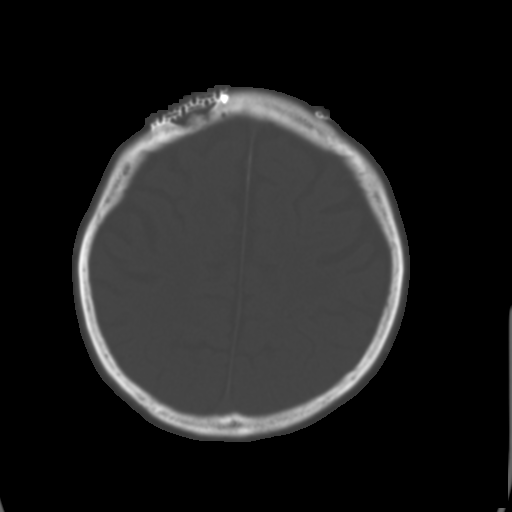
[im 25/33  brain]
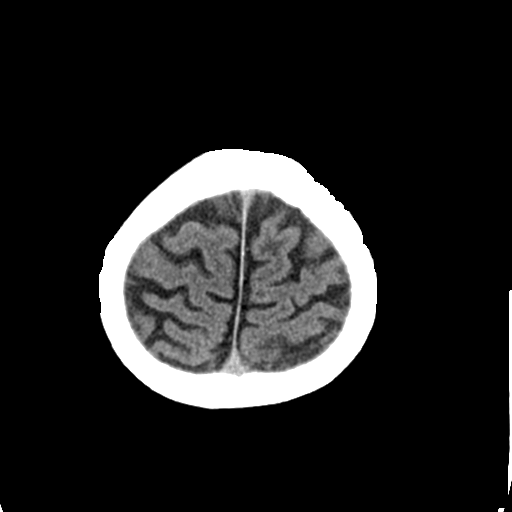
[im 29/33  brain]
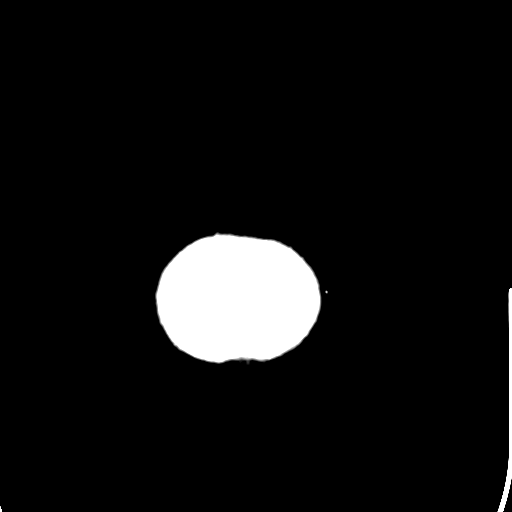

[Series 3: head bone · axial · 0.43mm/px · z∈[-104,-48]mm · 4 of 81 slices shown]
[im 9/81  bone]
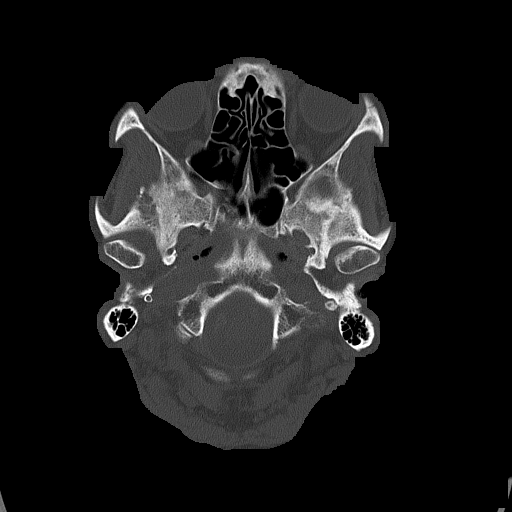
[im 17/81  bone]
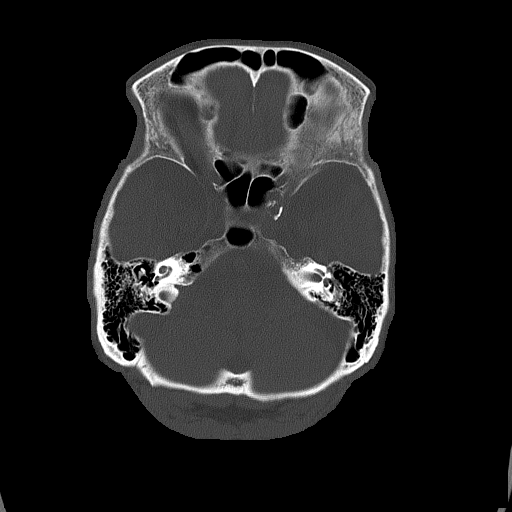
[im 25/81  bone]
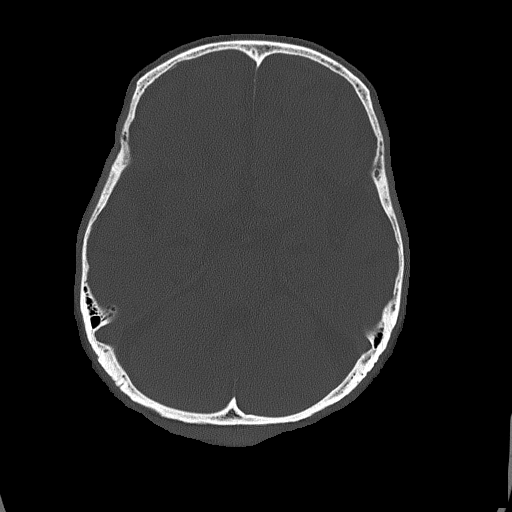
[im 37/81  bone]
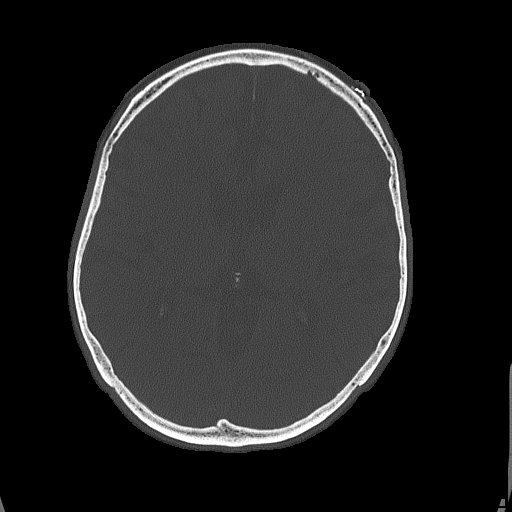

[Series 4: coronal soft tissue · coronal · 0.36mm/px · 3 of 69 slices shown]
[im 23/69  brain]
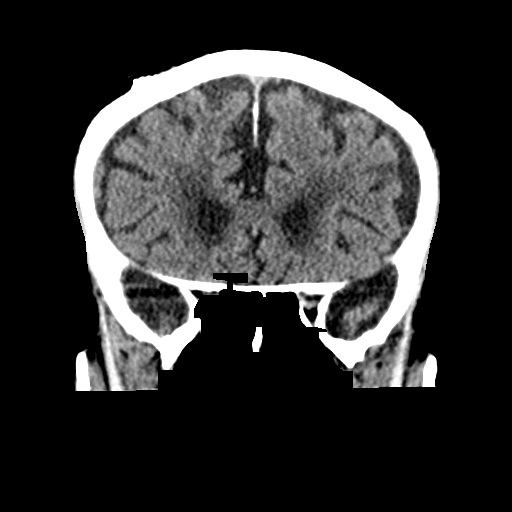
[im 31/69  brain]
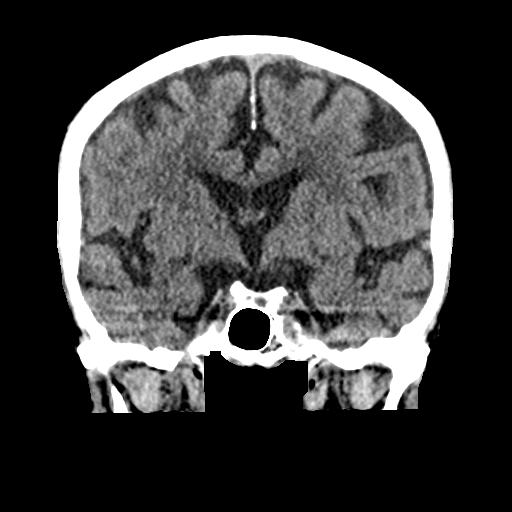
[im 38/69  brain]
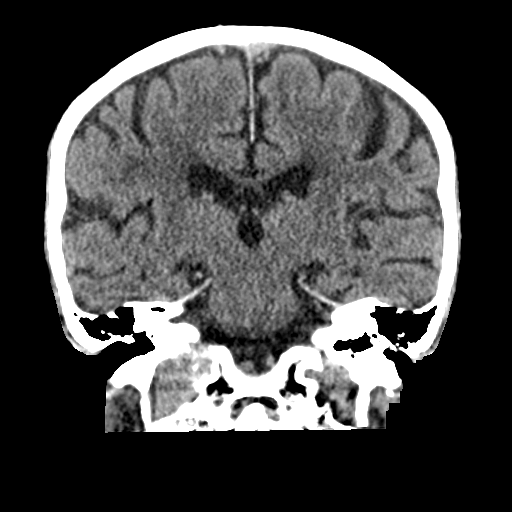

[Series 5: sagittal soft tissue · sagittal · 0.34mm/px · 3 of 59 slices shown]
[im 20/59  brain]
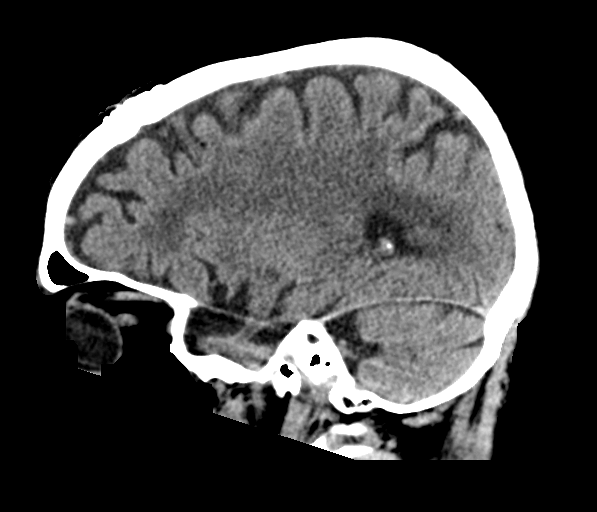
[im 30/59  brain]
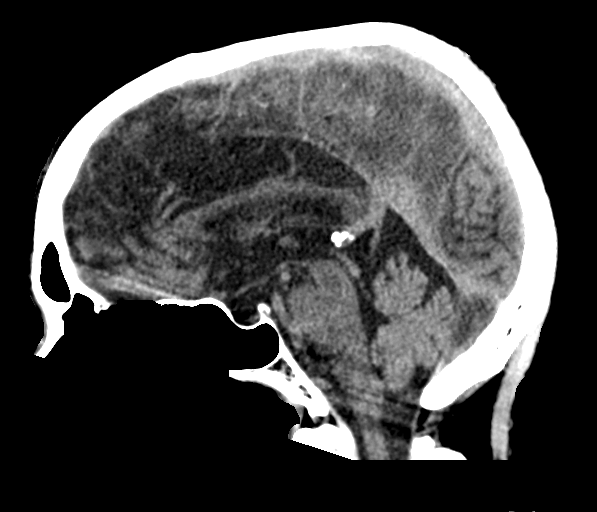
[im 39/59  brain]
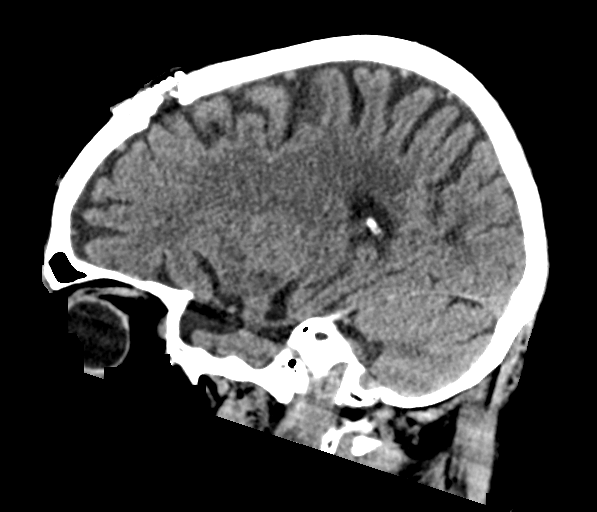

[17 of 47 positions shown; findings below may reference images not displayed]

FINDINGS: Brain: There is atrophy and chronic small vessel disease changes. No
acute intracranial abnormality. Specifically, no hemorrhage,
hydrocephalus, mass lesion, acute infarction, or significant
intracranial injury.

Vascular: No hyperdense vessel or unexpected calcification.

Skull: No acute calvarial abnormality. Postoperative changes in the
frontal regions bilaterally.

Sinuses/Orbits: No acute findings

Other: None
IMPRESSION: Atrophy, chronic microvascular disease.

No acute intracranial abnormality.

## 2021-02-05 MED ORDER — ACETAMINOPHEN 325 MG PO TABS
650.0000 mg | ORAL_TABLET | Freq: Once | ORAL | Status: AC
  Administered 2021-02-05: 650 mg via ORAL
  Filled 2021-02-05: qty 2

## 2021-02-05 NOTE — ED Triage Notes (Signed)
Pt to ED [facility] for fall at [employer], states his left knee gave out. Reports did hit head, +eliquis. No LOC Pt alert and oriented.  C/o left knee pain  Wife reports just finished radiation a week ago

## 2021-02-05 NOTE — ED Triage Notes (Signed)
Per EMS:  patient had a fall at home.  ? Weakness, and he said his left knee went out.  Hit head, but no loc. Cbg 163.  Initial bp 90/ and then most recent was 127 over 80s.  Witnessed by wife. Ems says wife says  patient has some dementia and is at baseline.

## 2021-02-05 NOTE — ED Notes (Signed)
Lab contacted [facility]

## 2021-02-05 NOTE — Discharge Instructions (Addendum)
CT scans do not show any injury from the fall.  There is cancer in the bones that we know about.  Blood work looks okay.  I think you should be okay to go home.  If you have any further problems do not hesitate to return or follow-up with your doctor.  Make sure you keep your follow-up appointments.

## 2021-02-05 NOTE — ED Provider Notes (Signed)
Emergency Medicine Provider Triage Evaluation Note  Gregory Burton , a 80 y.o. male  was evaluated in triage.  Pt complains of mechanical fall.  Patient presents via EMS from a [facility] [employer] [facility], where he states he fell after his left knee gave out.  He did fall backwards, hitting the back of his head.  Patient is currently on Eliquis.  He also has some history of weakness, with the wife noting some abnormalities to his magnesium and potassium.   Review of Systems  Positive: Head injury Negative: syncope  Physical Exam  BP 104/75   Pulse 75   Temp 97.9 F (36.6 C) (Oral)   Resp 20   Ht 5\' 9"  (1.753 m)   Wt 47.2 kg   SpO2 100%   BMI 15.36 kg/m  Gen:   Awake, no distress  NAD Resp:  Normal effort CAT MSK:   Moves extremities without difficulty  Other:  CVS: RRR  Medical Decision Making  Medically screening exam initiated at 4:36 PM.  Appropriate orders placed.  Mariano Doshi was informed that the remainder of the evaluation will be completed by another provider, this initial triage assessment does not replace that evaluation, and the importance of remaining in the ED until their evaluation is complete.  Geriatric patient with ED evaluation of head injury following mechanical fall.  No syncope or head laceration is reported.   Melvenia Needles, PA-C 02/05/21 1639    Arta Silence, MD 02/05/21 [phone]

## 2021-02-05 NOTE — ED Provider Notes (Signed)
[facility] Emergency Department Provider Note   ____________________________________________   Event Date/Time   First MD Initiated Contact with Patient 02/05/21 2001     (approximate)  I have reviewed the triage vital signs and the nursing notes.   HISTORY  Chief Complaint Fall    HPI Owens Hara is a 80 y.o. male who reports he was walking with his wife at the [employer] store and was turning around and tripped over his knee gave out he fell backwards hitting his head.  He is on Eliquis.  He has bone cancer and actually has an appointment with his cancer doctor tomorrow and Dr. Donella Stade has been giving him radiotherapy for bone mets.  His wife noted abnormalities of magnesium and potassium.  Patient denies having any chest pain then or now.  He is able to get up and walk easily in the room without any difficulty.         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   Dementia without behavioral disturbance ([place]) 07/05/2020   Bone metastasis ([place]) 05/26/2020   Neoplasm related pain 05/26/2020   Hypomagnesemia 05/26/2020   Dysuria 11/15/2019   Encounter for antineoplastic chemotherapy 08/11/2019   Weight loss 08/11/2019   Non-intractable vomiting 08/11/2019   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   Goals of care, counseling/discussion 06/23/2019   Encounter for audiology evaluation 06/23/2019   Squamous cell lung cancer ([place]) 06/23/2019   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 Authorizing Provider  atorvastatin (LIPITOR) 20 MG tablet Take 20 mg by mouth daily.    [provider]  colchicine 0.6 MG tablet Take 0.6 mg by mouth daily. SMARTSIG:1 Tablet(s) By Mouth Daily 11/21/20   [provider]  donepezil (ARICEPT) 10 MG tablet Take 10 mg by mouth at bedtime.    [provider]  dorzolamide-timolol (COSOPT) 22.3-6.8 MG/ML ophthalmic solution Place 1 drop into both eyes 2 (two) times daily.     [provider]  ELIQUIS 2.5 MG TABS tablet TAKE 1 TABLET BY MOUTH TWICE A DAY 01/23/21   Earlie Server, MD  folic acid (FOLVITE) 1 MG tablet Take 1 mg by mouth daily.    [provider]  latanoprost (XALATAN) 0.005 % ophthalmic solution Place 1 drop into both eyes at bedtime.     [provider]  lidocaine-prilocaine (EMLA) cream Apply 1 application topically as needed. Apply small amount to port  site approx 1-2 hours prior to appointment. 08/23/20   Earlie Server, MD  MAG64 64 MG TBEC TAKE 1 TABLET (64 MG TOTAL) BY MOUTH 2 (TWO) TIMES DAILY. 11/01/20   Earlie Server, MD  magic mouthwash w/lidocaine SOLN Take 5 mLs by mouth 3 (three) times daily as needed for mouth pain. 10/13/20   Jacquelin Hawking, NP  megestrol (MEGACE) 40 MG tablet TAKE 1 TABLET BY MOUTH TWICE A DAY 10/23/20   Earlie Server, MD  Multiple Vitamins-Minerals (CENTRUM ADULTS PO) Take by mouth.    [provider]  nystatin (MYCOSTATIN) 100000 UNIT/ML suspension TAKE 5 MLS (500,000 UNITS TOTAL) BY  MOUTH 4 (FOUR) TIMES DAILY. 11/01/20   Earlie Server, MD  oxyCODONE (OXY IR/ROXICODONE) 5 MG immediate release tablet Take 1 tablet (5 mg total) by mouth every 4 (four) hours as needed for severe pain. 05/08/20   Verlon Au, NP  Thiamine HCl (VITAMIN B-1 PO) Take 100 mg by mouth daily.    [provider]    Allergies Enalapril  No family history on file.  Social History Social History   Tobacco Use   Smoking status: Former    Years: 21.00    Types: Cigarettes    Quit date: 05/11/2008    Years since quitting: 12.7   Smokeless tobacco: Never  Vaping Use   Vaping Use: Never used  Substance Use Topics   Alcohol use: Yes    Comment: 0.5 pint liquor a week   Drug use: Never    Review of Systems  Constitutional: No fever/chills Eyes: No visual changes. ENT: No sore throat. Cardiovascular: Denies chest pain. Respiratory: Denies shortness of breath. Gastrointestinal: No abdominal pain.  No nausea, no vomiting.  No diarrhea.  No constipation. Genitourinary: Negative for dysuria. Musculoskeletal: Negative for back pain. Skin: Negative for rash. Neurological: Negative for headaches, focal weakness   ____________________________________________   PHYSICAL EXAM:  VITAL SIGNS: ED Triage Vitals  Enc Vitals Group     BP 02/05/21 1613 104/75     Pulse Rate 02/05/21 1612 75     Resp 02/05/21 1612 20     Temp 02/05/21 1614 97.9 F (36.6 C)     Temp Source 02/05/21 1614 Oral     SpO2 02/05/21 1612 100 %     Weight 02/05/21 1612 104 lb (47.2 kg)     Height 02/05/21 1612 5\' 9"  (1.753 m)     Head Circumference --      Peak Flow --      Pain Score 02/05/21 1611 6     Pain Loc --      Pain Edu? --      Excl. in [place]? --     Constitutional: Alert and oriented. Well appearing and in no acute distress. Eyes: Conjunctivae are normal.  Head: Atraumatic. Nose: No congestion/rhinnorhea. Mouth/Throat: Mucous membranes are moist.   Neck: No stridor.   Cardiovascular:Good  peripheral circulation. Respiratory: Normal respiratory effort.  No retractions.  Musculoskeletal: No lower extremity tenderness nor edema.  Left knee shows no bruising no pain on palpation full range of motion appears to be stable on exam there is no bruising.  Patient is able to get up and walk without any difficulty. Neurologic:  Normal speech and language. No gross focal neurologic deficits are appreciated. No gait instability. Skin:  Skin is warm, dry and intact. No rash noted.  ____________________________________________   LABS (all labs ordered are listed, but only abnormal results are displayed)  Labs Reviewed  CBC - Abnormal; Notable for the following components:      Result Value   RBC 3.73 (*)    Hemoglobin 11.1 (*)    HCT 34.2 (*)    All other components within normal limits  BASIC METABOLIC PANEL - Abnormal; Notable for the following components:   Glucose, Bld 134 (*)    All other components within normal limits  MAGNESIUM   ____________________________________________  EKG  EKG read interpreted by me shows normal sinus rhythm rate of 77 left atrial enlargement rightward axis but limb lead reversal suspected by the computer and I believe that this is true.  Patient does not have any signs of acute MI. ____________________________________________  RADIOLOGY Gertha Calkin, personally viewed and evaluated these images (plain radiographs) as part of my medical decision making, as well as reviewing the written report by the radiologist.  ED MD interpretation: CT of head and neck read by radiology reviewed by me shows no sign of acute traumatic injury.   Official radiology report(s): CT HEAD WO CONTRAST (5MM)  Result Date: 02/05/2021 CLINICAL DATA:  Fall.  Head trauma, moderate-severe EXAM: CT HEAD WITHOUT CONTRAST TECHNIQUE: Contiguous axial images were obtained from the base of the skull through the vertex without intravenous contrast. COMPARISON:  None.  FINDINGS: Brain: There is atrophy and chronic small vessel disease changes. No acute intracranial abnormality. Specifically, no hemorrhage, hydrocephalus, mass lesion, acute infarction, or significant intracranial injury. Vascular: No hyperdense vessel or unexpected calcification. Skull: No acute calvarial abnormality. Postoperative changes in the frontal regions bilaterally. Sinuses/Orbits: No acute findings Other: None IMPRESSION: Atrophy, chronic microvascular disease. No acute intracranial abnormality. Electronically Signed   By: Rolm Baptise M.D.   On: 02/05/2021 18:22   CT Cervical Spine Wo Contrast  Result Date: 02/05/2021 CLINICAL DATA:  Fall. Neck trauma. History of or pharyngeal carcinoma and left upper lobe squamous cell lung carcinoma. EXAM: CT CERVICAL SPINE WITHOUT CONTRAST TECHNIQUE: Multidetector CT imaging of the cervical spine was performed without intravenous contrast. Multiplanar CT image reconstructions were also generated. COMPARISON:  Neck CT 12/11/2020 FINDINGS: Alignment: No subluxation. Skull base and vertebrae: Destructive lesions in the right side of the C4 vertebral body and posterior elements and T2 vertebral body are again noted. T2 is stable. Progressive destruction noted in the right side of C4 compatible with metastatic disease. No fracture. Soft tissues and spinal canal: No prevertebral fluid or swelling. No visible canal hematoma. Disc levels: Advanced diffuse degenerative disc disease and facet disease bilaterally. Upper chest: Cystic area/lesion in the left upper lobe measures 14 mm, stable. Other: No acute findings IMPRESSION: Stable destructive process in the right side of the T2 vertebral body. Progressive destructive process in the right side and posterior elements of C4. Advanced diffuse degenerative disc and facet disease. No fracture. Electronically Signed   By: Rolm Baptise M.D.   On: 02/05/2021 18:21     ____________________________________________   PROCEDURES  Procedure(s) performed (including Critical Care):  Procedures   ____________________________________________   INITIAL IMPRESSION / ASSESSMENT AND PLAN / ED COURSE  Patient with mechanical fall.  No sign of any acute injury.  He is on Eliquis but CT of the head neck is normal and has been waiting for some time and is perfectly stable.  I believe he can go.  He has a follow-up with his cancer doctor tomorrow who can review the CT scans to double check on the bone mets.  I have discussed this with the patient and his wife  and they understand the need to do so.  Patient has been seeing Dr. Donella Stade for radiotherapy of the bone mets.              ____________________________________________   FINAL CLINICAL IMPRESSION(S) / ED DIAGNOSES  Final diagnoses:  Fall, initial encounter     ED Discharge Orders     None        Note:  This document was prepared using Dragon voice recognition software and may include unintentional dictation errors.    Nena Polio, MD 02/05/21 2017

## 2021-02-06 ENCOUNTER — Inpatient Hospital Stay (HOSPITAL_BASED_OUTPATIENT_CLINIC_OR_DEPARTMENT_OTHER): Payer: Medicare Other | Admitting: Oncology

## 2021-02-06 ENCOUNTER — Inpatient Hospital Stay: Payer: Medicare Other | Attending: Oncology

## 2021-02-06 VITALS — BP 141/85 | HR 72 | Temp 97.3°F | Resp 18 | Wt 101.6 lb

## 2021-02-06 DIAGNOSIS — I82C11 Acute embolism and thrombosis of right internal jugular vein: Secondary | ICD-10-CM | POA: Diagnosis not present

## 2021-02-06 DIAGNOSIS — R634 Abnormal weight loss: Secondary | ICD-10-CM | POA: Insufficient documentation

## 2021-02-06 DIAGNOSIS — C3492 Malignant neoplasm of unspecified part of left bronchus or lung: Secondary | ICD-10-CM | POA: Diagnosis not present

## 2021-02-06 DIAGNOSIS — C7951 Secondary malignant neoplasm of bone: Secondary | ICD-10-CM | POA: Diagnosis not present

## 2021-02-06 DIAGNOSIS — Z8546 Personal history of malignant neoplasm of prostate: Secondary | ICD-10-CM | POA: Diagnosis not present

## 2021-02-06 DIAGNOSIS — C109 Malignant neoplasm of oropharynx, unspecified: Secondary | ICD-10-CM

## 2021-02-06 DIAGNOSIS — Z87891 Personal history of nicotine dependence: Secondary | ICD-10-CM | POA: Diagnosis not present

## 2021-02-06 DIAGNOSIS — Z923 Personal history of irradiation: Secondary | ICD-10-CM | POA: Insufficient documentation

## 2021-02-06 DIAGNOSIS — C320 Malignant neoplasm of glottis: Secondary | ICD-10-CM | POA: Insufficient documentation

## 2021-02-06 DIAGNOSIS — C329 Malignant neoplasm of larynx, unspecified: Secondary | ICD-10-CM | POA: Diagnosis not present

## 2021-02-06 DIAGNOSIS — C3412 Malignant neoplasm of upper lobe, left bronchus or lung: Secondary | ICD-10-CM | POA: Insufficient documentation

## 2021-02-06 NOTE — Progress Notes (Signed)
Pt here for follow up. Wife reports pt had a fall yesterday and went to ER. At ER he had labs, EKG and CT done.

## 2021-02-06 NOTE — Progress Notes (Signed)
Hematology/Oncology progress note  [phone] [phone]   Patient Care Team: Casilda Carls, MD as PCP - General (Internal Medicine) Earlie Server, [name], [name]  HISTORY OF PRESENTING ILLNESS:  History of prostate cancer. S/p Radiation/seed, he follows up with [facility].  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  PD-L1-IHC 40%, no targetable mutations. # 09/07/2019 chemotherapy [cisplatin] and radiation.   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  Interval decrease of the posterior left upper lobe pleural-based nodule with interval decrease in size of the tiny adjacent satellite nodule.  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  No recurrent tumor in right piriform sinus.  Right lateral pharyngeal lymph node is stable.  No new or recurrent adenopathy.Suspect metastasis.  I recommend patient to obtain PET scan restaging.  I discussed about the plan of re-biopsy of either the paraspinal soft tissue mass versus other more feasible hypermetabolic sites detected on PET scan. Patient was seen by Dr. Kennyth Lose and opted to proceed with palliative radiation as soon as possible.  PET scan cannot be arranged prior to the radiation so the PET scan was canceled. 04/20/2020 -04/25/2020, palliative radiation to thoracic spine  05/10/2020, MRI  thoracic spine was obtained for worsening of back pain and left shoulder pain.  Images showed metastatic disease throughout almost the entire T2 with and associated mild superior endplate compression fracture.  Abnormal signal in the anterior, inferior endplate of T1, superior aspect of the T3.  Small metastatic deposit in T12 is also noted. Case was also discussed on multidisciplinary tumor board on 05/25/2020 05/22/2020, PET scan showed new hypermetabolic lytic bone metastasis in the left scapular near the glenoid and in the T2 vertebral body.  Persistent hypermetabolic right lateral retropharyngeal and right level 3 neck lymph node metastasis.  Peripheral left upper lobe pulmonary nodule is mildly decreased with new surrounding mild platelike postradiation changes..  No residual recurrent neoplasm at the site of vomiting in the right piriform sinus  06/07/2020 -06/09/2020 patient underwent palliative radiation to left scapular April 2022 seen by [name] and flexible laryngoscope examination showed new lesion at the right false vocal cord.  Case was discussed on tumor board on 06/22/2020. [name] recommend additional CT soft tissue neck with contrast for additional evaluation. 06/29/2020 CT images were reviewed and discussed with patient. There is new malignant lymph node in the right mid neck at the level of the hyoid, this compressing the right jugular vein with thrombus in the vein above and below the lymph node.  Cystic retropharyngeal lymph node on the right is stable.  Progressive soft tissue thickening in the larynx bilaterally suspicious for tumor.  Metastatic disease at the T2 is unchanged.  Metastatic deposit left scapula there is now a pathological fracture of the scapular. CT scan findings were discussed with ENT [name] via secure chat.  [name] feels surgery is not feasible.  The larynx  lesion currently does not compromise his airway however further progression may in the  future  07/10/2020 Seen by Radonc [name] who would offer salvage radiation to his larynx as well as neck adenopathy. Would like low dose chemotherapy as sensitizer.    Patient was also referred to establish care with Dr. Georgiann Cocker at [facility] for kyphoplasty.  Patient's wife reports that they were never contacted and she called Dr. Georgiann Cocker office with no success.  # NGS.  PD-L1-IHC 40%, no targetable mutations # 07/25/20- 07/27/2020, 08/01/20- 09/06/2020  concurrent chemoradiation [low-dose weekly cisplatin 5m/m2 ] to the larynx  09/15/2020, patient was seen by ENT Dr. MTami Ribas  Laryngoscope examination reviewed clear larynx, no lesion was discovered. 10/04/2020, started on maintenance Keytruda. 12/11/2020 restaging CT 1. Progression of Disease: - new since May right C3 destructive osseous metastasis has eroded the right pedicle and facet, with tumor filling the right C4 neural foramen and engulfing the right vertebral artery which remains patent. Mild right lateral epidural tumor. - small (8 mm short axis) but progressed right level 2A nodal disease with evidence of extracapsular extension. 2. 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  3. No recurrent laryngeal or pharyngeal tumor identified.  4. Severe carotid atherosclerosis and stenosis greater on the right.Right ICA remains patent. 5. CT Chest, Abdomen, and Pelvis the same day are reported separately.    [name] Patient was accompanied by wife.  He tolerates Keytruda.   During the interval patient has had a CT scan done Patient was seen by Dr. CBaruch Goutyon 12/29/2020, patient received additional palliative radiation to his C-spine.  Finished on 01/17/2021.  Patient had a fall and went to  emergency room on 02/05/2021.   CT cervical spine without contrast showed stable destructive process in the right side of the T3 vertebral body.  Progressive destructive process in the right side and the posterior element of C4.  No fracture.   Review of Systems  Constitutional:  Positive for fatigue. Negative for appetite change, chills, fever and unexpected weight change.  HENT:   Positive for hearing loss and voice change.   Eyes:  Negative for eye problems and icterus.  Respiratory:  Negative for chest tightness, cough and shortness of breath.   Cardiovascular:  Negative for chest pain and leg swelling.  Gastrointestinal:  Negative for abdominal distention, abdominal pain, blood in stool and nausea.  Endocrine: Negative for hot flashes.  Genitourinary:  Negative for difficulty urinating, dysuria and frequency.   Musculoskeletal:  Positive for arthralgias.  Skin:  Negative for itching and rash.  Neurological:  Negative for extremity weakness, light-headedness and numbness.  Hematological:  Negative for adenopathy. Does not bruise/bleed easily.  Psychiatric/Behavioral:  Negative for confusion.        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   Prostate CA ([facility] 2008   Squamous cell lung cancer ([facility] 06/23/2019    SURGICAL HISTORY: Past Surgical History:  Procedure Laterality Date  BRAIN SURGERY  2012   HERNIA REPAIR     PORTA CATH INSERTION N/A 07/27/2019   Procedure: PORTA CATH INSERTION;  Surgeon: Katha Cabal, MD;  Location: [facility];  Service: Cardiovascular;  Laterality: N/A;    SOCIAL HISTORY: Social History   Socioeconomic History   Marital status: Married    Spouse name: Not on file   Number of  children: Not on file   Years of education: Not on file   Highest education level: Not on file  Occupational History   Not on file  Tobacco Use   Smoking status: Former    Years: 21.00    Types: Cigarettes    Quit date: 05/11/2008    Years since quitting: 12.7   Smokeless tobacco: Never  Vaping Use   Vaping Use: Never used  Substance and Sexual Activity   Alcohol use: Yes    Comment: 0.5 pint liquor a week   Drug use: Never   Sexual activity: Not Currently  Other Topics Concern   Not on file  Social History Narrative   Lives at home with wife. Wife states he has some dementia but is able to sign his own consent.   Social Determinants of Health   Financial Resource Strain: Not on file  Food Insecurity: Not on file  Transportation Needs: Not on file  Physical Activity: Not on file  Stress: Not on file  Social Connections: Not on file  Intimate Partner Violence: Not on file    FAMILY HISTORY: No family history on file.  ALLERGIES:  is allergic to enalapril.  MEDICATIONS:  Current Outpatient Medications  Medication Sig Dispense Refill   atorvastatin (LIPITOR) 20 MG tablet Take 20 mg by mouth daily.     donepezil (ARICEPT) 10 MG tablet Take 10 mg by mouth at bedtime.     dorzolamide-timolol (COSOPT) 22.3-6.8 MG/ML ophthalmic solution Place 1 drop into both eyes 2 (two) times daily.      ELIQUIS 2.5 MG TABS tablet TAKE 1 TABLET BY MOUTH TWICE A DAY 60 tablet 1   folic acid (FOLVITE) 1 MG tablet Take 1 mg by mouth daily.     latanoprost (XALATAN) 0.005 % ophthalmic solution Place 1 drop into both eyes at bedtime.      lidocaine-prilocaine (EMLA) cream Apply 1 application topically as needed. Apply small amount to port site approx 1-2 hours prior to appointment. 30 g 1   MAG64 64 MG TBEC TAKE 1 TABLET (64 MG TOTAL) BY MOUTH 2 (TWO) TIMES DAILY. 180 tablet 0   Multiple Vitamins-Minerals (CENTRUM ADULTS PO) Take by mouth.     nystatin (MYCOSTATIN) 100000 UNIT/ML suspension  TAKE 5 MLS (500,000 UNITS TOTAL) BY MOUTH 4 (FOUR) TIMES DAILY. 480 mL 1   oxyCODONE (OXY IR/ROXICODONE) 5 MG immediate release tablet Take 1 tablet (5 mg total) by mouth every 4 (four) hours as needed for severe pain. 90 tablet 0   Thiamine HCl (VITAMIN B-1 PO) Take 100 mg by mouth daily.     colchicine 0.6 MG tablet Take 0.6 mg by mouth daily. SMARTSIG:1 Tablet(s) By Mouth Daily (Patient not taking: Reported on 02/06/2021)     magic mouthwash w/lidocaine SOLN Take 5 mLs by mouth 3 (three) times daily as needed for mouth pain. (Patient not taking: Reported on 02/06/2021) 5 mL 5   megestrol (MEGACE) 40 MG tablet TAKE 1 TABLET BY MOUTH TWICE A DAY (Patient not taking: Reported on 02/06/2021) 60 tablet 0   No current facility-administered  medications for this visit.   Facility-Administered Medications Ordered in Other Visits  Medication Dose Route Frequency Provider Last Rate Last Admin   sodium chloride flush (NS) 0.9 % injection 10 mL  10 mL Intravenous PRN Earlie Server, MD   10 mL at 03/01/20 1324     PHYSICAL EXAMINATION: ECOG PERFORMANCE STATUS: 1 - Symptomatic but completely ambulatory Vitals:   02/06/21 1511  BP: (!) 141/85  Pulse: 72  Resp: 18  Temp: (!) 97.3 F (36.3 C)   Filed Weights   02/06/21 1511  Weight: 101 lb 9.6 oz (46.1 kg)    Physical Exam Constitutional:      General: He is not in acute distress. HENT:     Head: Normocephalic and atraumatic.  Eyes:     General: No scleral icterus. Cardiovascular:     Rate and Rhythm: Normal rate and regular rhythm.     Heart sounds: Normal heart sounds.  Pulmonary:     Effort: Pulmonary effort is normal. No respiratory distress.     Breath sounds: No wheezing.  Abdominal:     General: Bowel sounds are normal. There is no distension.     Palpations: Abdomen is soft.  Musculoskeletal:        General: No deformity.     Cervical back: Normal range of motion and neck supple.  Lymphadenopathy:     Cervical: No cervical  adenopathy.  Skin:    General: Skin is warm and dry.     Findings: No erythema or rash.  Neurological:     Mental Status: He is alert. Mental status is at baseline.     Cranial Nerves: No cranial nerve deficit.     Coordination: Coordination normal.    LABORATORY DATA:  I have reviewed the data as listed Lab Results  Component Value Date   WBC 10.0 02/05/2021   HGB 11.1 (L) 02/05/2021   HCT 34.2 (L) 02/05/2021   MCV 91.7 02/05/2021   PLT 258 02/05/2021   Recent Labs    12/06/20 0822 12/27/20 1335 01/11/21 0918 01/11/21 1351 02/05/21 1653  NA 136 141 SPECIMEN CONTAMINATED, UNABLE TO PERFORM TEST(S). 135 139  K 3.7 4.1 SPECIMEN CONTAMINATED, UNABLE TO PERFORM TEST(S). 4.8 4.4  CL 104 108 SPECIMEN CONTAMINATED, UNABLE TO PERFORM TEST(S). 105 103  CO2 25 27 SPECIMEN CONTAMINATED, UNABLE TO PERFORM TEST(S). 24 29  GLUCOSE 100* 117* SPECIMEN CONTAMINATED, UNABLE TO PERFORM TEST(S). 85 134*  BUN 16 23 SPECIMEN CONTAMINATED, UNABLE TO PERFORM TEST(S). 14 19  CREATININE 0.79 0.92 SPECIMEN CONTAMINATED, UNABLE TO PERFORM TEST(S). 0.72 0.91  CALCIUM 9.1 9.1 SPECIMEN CONTAMINATED, UNABLE TO PERFORM TEST(S). 9.2 9.7  GFRNONAA >60 >60 SPECIMEN CONTAMINATED, UNABLE TO PERFORM TEST(S). >60 >60  PROT 6.8 7.4 3.8*  --   --   ALBUMIN 3.7 3.9 2.0*  --   --   AST 18 18 9*  --   --   ALT 14 14 8   --   --   ALKPHOS 56 61 30*  --   --   BILITOT 0.3 0.6 0.5  --   --   BILIDIR  --   --  <0.1  --   --   IBILI  --   --  NOT CALCULATED  --   --     Iron/TIBC/Ferritin/ %Sat No results found for: IRON, TIBC, FERRITIN, IRONPCTSAT    RADIOGRAPHIC STUDIES: I have personally reviewed the radiological images as listed and agreed with the findings in the report. CT HEAD WO  CONTRAST (5MM)  Result Date: 02/05/2021 CLINICAL DATA:  Fall.  Head trauma, moderate-severe EXAM: CT HEAD WITHOUT CONTRAST TECHNIQUE: Contiguous axial images were obtained from the base of the skull through the vertex without  intravenous contrast. COMPARISON:  None. FINDINGS: Brain: There is atrophy and chronic small vessel disease changes. No acute intracranial abnormality. Specifically, no hemorrhage, hydrocephalus, mass lesion, acute infarction, or significant intracranial injury. Vascular: No hyperdense vessel or unexpected calcification. Skull: No acute calvarial abnormality. Postoperative changes in the frontal regions bilaterally. Sinuses/Orbits: No acute findings Other: None IMPRESSION: Atrophy, chronic microvascular disease. No acute intracranial abnormality. Electronically Signed   By: Rolm Baptise M.D.   On: 02/05/2021 18:22   CT SOFT TISSUE NECK W CONTRAST  Result Date: 12/12/2020 CLINICAL DATA:  80 year old male with a history of oropharyngeal carcinoma, left upper lobe squamous cell lung carcinoma, thoracic spine metastases. EXAM: CT NECK WITH CONTRAST TECHNIQUE: Multidetector CT imaging of the neck was performed using the standard protocol following the bolus administration of intravenous contrast. CONTRAST:  118m OMNIPAQUE IOHEXOL 300 MG/ML SOLN in conjunction with contrast enhanced imaging of the chest, abdomen, and pelvis reported separately. COMPARISON:  CT Chest, Abdomen, and Pelvis from the same day reported separately. Neck CT 06/29/2020.  PET-CT 05/22/2020. FINDINGS: Pharynx and larynx: Pharyngeal and pharyngeal soft tissue contours are within normal limits. Supraglottic laryngeal soft tissue thickening has regressed since May, might be sequelae of radiation. No discrete hyperenhancement or mass. Retropharyngeal effusion has mildly increased. Parapharyngeal spaces are stable. Salivary glands: Post radiation changes, otherwise negative. Thyroid: Negative. Lymph nodes: Post treatment architectural distortion at the right level 2 nodal station, and nearby cystic right retropharyngeal node (series 1, not significantly changed from May. However, and irregular and enhancing 7-8 mm short axis soft tissue nodule at  the right level 2 B station has increased since that time suspicious for progressed metastatic nodal disease on series 1, image 30. This was hypermetabolic and smaller on the March PET-CT. Right level IIIa nodal station indistinct, ex nodal tumor on series 1, image 56 appears stable since May, with hypermetabolic in March. Small but increased level 1 lymph nodes are up to 5 mm short axis and indeterminate. Other nodal stations appear stable. Vascular: Chronic segmental right jugular vein thrombosis secondary to the lymph node disease above. Right side Port-A-Cath remains in place. Severe chronic carotid calcified atherosclerosis with high-grade string sign stenosis on the right, but the right ICA remains patent. Right vertebral artery remains patent although is now engulfed by tumor at the C3 level. See series 1, image 36 and osseous details below. Limited intracranial: Stable. Calcified atherosclerosis at the skull base. Visualized orbits: Negative. Mastoids and visualized paranasal sinuses: Clear. Skeleton: New since May destructive osseous lesion involving the right lateral body and posterior elements of C3. See series 7, image 67 and series 1, images 35 and 36. Tumor here encompasses up to 26 mm diameter and engulfs the right vertebral artery which remains patent. The right pedicle and a portion of the facet is eroded. Cervical vertebral alignment is maintained. The right C4 neural foramen is fiilled with tumor and there is mild right lateral epidural tumor. Superimposed cervical spine degeneration including chronic ankylosis of the right C2-C3 facet. Congenital incomplete ossification of the posterior C1 ring. No other osseous metastatic disease identified in the neck. Absent and carious dentition. Chronic T2 right side vertebral body metastasis, see chest CT reported separately. Upper chest: Reported separately. IMPRESSION: 1. Progression of Disease: - new since May right C3  destructive osseous metastasis has  eroded the right pedicle and facet, with tumor filling the right C4 neural foramen and engulfing the right vertebral artery which remains patent. Mild right lateral epidural tumor. - small (8 mm short axis) but progressed right level 2A nodal disease with evidence of extracapsular extension. 2. 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 3. No recurrent laryngeal or pharyngeal tumor identified. 4. Severe carotid atherosclerosis and stenosis greater on the right. Right ICA remains patent. 5. CT Chest, Abdomen, and Pelvis the same day are reported separately. Electronically Signed   By: Genevie Ann M.D.   On: 12/12/2020 08:59   CT Cervical Spine Wo Contrast  Result Date: 02/05/2021 CLINICAL DATA:  Fall. Neck trauma. History of or pharyngeal carcinoma and left upper lobe squamous cell lung carcinoma. EXAM: CT CERVICAL SPINE WITHOUT CONTRAST TECHNIQUE: Multidetector CT imaging of the cervical spine was performed without intravenous contrast. Multiplanar CT image reconstructions were also generated. COMPARISON:  Neck CT 12/11/2020 FINDINGS: Alignment: No subluxation. Skull base and vertebrae: Destructive lesions in the right side of the C4 vertebral body and posterior elements and T2 vertebral body are again noted. T2 is stable. Progressive destruction noted in the right side of C4 compatible with metastatic disease. No fracture. Soft tissues and spinal canal: No prevertebral fluid or swelling. No visible canal hematoma. Disc levels: Advanced diffuse degenerative disc disease and facet disease bilaterally. Upper chest: Cystic area/lesion in the left upper lobe measures 14 mm, stable. Other: No acute findings IMPRESSION: Stable destructive process in the right side of the T2 vertebral body. Progressive destructive process in the right side and posterior elements of C4. Advanced diffuse degenerative disc and facet  disease. No fracture. Electronically Signed   By: Rolm Baptise M.D.   On: 02/05/2021 18:21   CT CHEST ABDOMEN PELVIS W CONTRAST  Result Date: 12/12/2020 CLINICAL DATA:  Follow-up left upper lobe squamous cell carcinoma, metastatic oropharyngeal carcinoma, restaging EXAM: CT CHEST, ABDOMEN, AND PELVIS WITH CONTRAST TECHNIQUE: Multidetector CT imaging of the chest, abdomen and pelvis was performed following the standard protocol during bolus administration of intravenous contrast. CONTRAST:  164m OMNIPAQUE IOHEXOL 300 MG/ML  SOLN COMPARISON:  PET-CT, 05/22/2020 FINDINGS: CT CHEST FINDINGS Cardiovascular: Right chest port catheter. Aortic atherosclerosis. Normal heart size. Extensive 3 vessel coronary artery calcifications and/or stents status post CABG. No pericardial effusion. Mediastinum/Nodes: No enlarged mediastinal, hilar, or axillary lymph nodes. Thyroid gland, trachea, and esophagus demonstrate no significant findings. Lungs/Pleura: Expected interval evolution of radiation fibrosis about a treated nodule of the peripheral left upper lobe, measuring approximately 2.7 x 2.3 cm, not significantly changed (series 7, image 59). Bilateral calcified pleural plaques. No pleural effusion or pneumothorax. Musculoskeletal: No chest wall mass. Redemonstrated lytic lesion of the right aspect of T2 (series 9, image 128) as well as the left scapula near the coronoid process and glenoid (series 7, image 10). CT ABDOMEN PELVIS FINDINGS Hepatobiliary: No solid liver abnormality is seen. No gallstones, gallbladder wall thickening, or biliary dilatation. Pancreas: Unremarkable. No pancreatic ductal dilatation or surrounding inflammatory changes. Spleen: Normal in size without significant abnormality. Adrenals/Urinary Tract: Adrenal glands are unremarkable. Kidneys are normal, without renal calculi, solid lesion, or hydronephrosis. Bladder is unremarkable. Stomach/Bowel: Stomach is within normal limits. Appendix appears  normal. No evidence of bowel wall thickening, distention, or inflammatory changes. Sigmoid diverticula. Vascular/Lymphatic: Aortic atherosclerosis. No enlarged abdominal or pelvic lymph nodes. Reproductive: Prostate brachytherapy pellets. Other:  No abdominal wall hernia or abnormality. No abdominopelvic ascites. Musculoskeletal: No acute or significant osseous findings. IMPRESSION: 1. Expected interval evolution of radiation fibrosis about a treated nodule of the peripheral left upper lobe, measuring approximately 2.7 x 2.3 cm, not significantly changed. 2. Redemonstrated lytic osseous metastatic lesions of the right aspect of T2 as well as the left scapula. No new lesions identified by CT. 3. No evidence of metastatic disease in the abdomen or pelvis. 4. Prostate brachytherapy. 5. Coronary artery disease. 6. Sigmoid diverticulosis. Aortic Atherosclerosis (ICD10-I70.0). Electronically Signed   By: Delanna Ahmadi M.D.   On: 12/12/2020 10:46   DG Chest Portable 1 View  Result Date: 01/11/2021 CLINICAL DATA:  Weakness.  Stage IV cancer. EXAM: PORTABLE CHEST 1 VIEW COMPARISON:  06/29/2019.  CT 12/11/2020. FINDINGS: Power port on the right with tip in the proximal right atrium. Focal density in the left lateral chest related to previous mass with subsequent radiation change. See results of CT scan 12/11/2020. Question mild patchy infiltrate in the right lower lobe. Bilateral nipple shadows and bilateral pleural calcifications remain visible. IMPRESSION: Focal density in the left lateral midlung related to tumor with previous radiation treatment change. Question mild patchy pneumonia at the right lung base. Bilateral nipple shadows and pleural calcifications as seen previously. Electronically Signed   By: Nelson Chimes M.D.   On: 01/11/2021 11:36    ASSESSMENT & PLAN:  No diagnosis found.  Cancer Staging  Larynx cancer 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 01/16/2021, patient underwent additional palliative radiation to C-spine.  Repeat CT cervical spine without contrast on 02/05/2021 showed stable destructive process of T2.  Progressive destructive process in the right side in the posterior element of C4 comparing to neck CT on 12/11/2020.  I recommend patient to establish care with a neurosurgeon for further evaluation.    #Hypomagnesia, continue oral Slow-Mag, continue 1 tablet twice daily.    #Left upper lobe squamous cell carcinoma, stage I Finished SBRT on 02/07/2020.  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  We will decide duration of anticoagulation after reimaging.  #Weight loss, weight has been stable.  Continue nutrition supplementation.    all questions were answered. The patient knows to call the clinic with any problems questions or concerns.   Return of visit: I will see him back in a few weeks to restart Keytruda. ZEarlie Server MD, PhD 02/06/2021

## 2021-02-11 ENCOUNTER — Other Ambulatory Visit: Payer: Self-pay

## 2021-02-14 ENCOUNTER — Telehealth: Payer: Self-pay | Admitting: *Deleted

## 2021-02-14 NOTE — Telephone Encounter (Signed)
Patient wife and son both has tried numerous times to reach Dr Georgiann Cocker and was told to call us back if she could not reach him. She said that other plans were to be made and patient to be seen by [place] if she could not reach Dr Georgiann Cocker. Please advise

## 2021-02-14 NOTE — Telephone Encounter (Signed)
Contacted a previous number that we had for Dr. Georgiann Cocker office. [phone]. Left message for them to call back and let us know if number has changed or if Dr. Georgiann Cocker no longer works there

## 2021-02-20 ENCOUNTER — Encounter: Payer: Self-pay | Admitting: Oncology

## 2021-02-21 ENCOUNTER — Telehealth: Payer: Self-pay

## 2021-02-21 NOTE — Telephone Encounter (Signed)
Please schedule pt for lab/MD/ Keytruda (next avail chemo slot) and notify pt of appt.

## 2021-02-21 NOTE — Telephone Encounter (Signed)
-----   Message from Earlie Server, MD sent at 02/20/2021 10:50 PM EST ----- Any update on his appt with [facility] neurosurgeon?  Please schedule him to do a follow up with me lab md [place]. Thanks.

## 2021-02-22 ENCOUNTER — Inpatient Hospital Stay: Payer: Medicare Other

## 2021-02-22 ENCOUNTER — Other Ambulatory Visit: Payer: Self-pay

## 2021-02-22 ENCOUNTER — Ambulatory Visit
Admission: RE | Admit: 2021-02-22 | Discharge: 2021-02-22 | Disposition: A | Discharge status: Home / Self Care | Payer: Medicare Other | Source: Ambulatory Visit | Attending: Radiation Oncology | Admitting: Radiation Oncology

## 2021-02-22 ENCOUNTER — Encounter: Payer: Self-pay | Admitting: Radiation Oncology

## 2021-02-22 VITALS — BP 129/73 | HR 69 | Temp 97.6°F | Wt 100.6 lb

## 2021-02-22 DIAGNOSIS — C7802 Secondary malignant neoplasm of left lung: Secondary | ICD-10-CM | POA: Diagnosis not present

## 2021-02-22 DIAGNOSIS — Z923 Personal history of irradiation: Secondary | ICD-10-CM | POA: Insufficient documentation

## 2021-02-22 DIAGNOSIS — C01 Malignant neoplasm of base of tongue: Secondary | ICD-10-CM | POA: Insufficient documentation

## 2021-02-22 DIAGNOSIS — C7951 Secondary malignant neoplasm of bone: Secondary | ICD-10-CM | POA: Diagnosis present

## 2021-02-22 DIAGNOSIS — R911 Solitary pulmonary nodule: Secondary | ICD-10-CM

## 2021-02-22 NOTE — Progress Notes (Signed)
Nutrition  Patient and wife saw RD in lobby area today and requesting ensure.  Complimentary case of ensure plus given to patient.   Gregory Burton B. Zenia Resides, [place], [place] Registered Dietitian [phone] (mobile)

## 2021-02-22 NOTE — Progress Notes (Signed)
Radiation Oncology Follow up Note  Name: Gregory Burton   Date:   02/22/2021 MRN:  [id number] DOB: March 31, 1940    This 80 y.o. 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  REFERRING PROVIDER: Casilda Carls, MD  HPI: Patient is an 80 year old male well-known to our department having received multiple courses of radiation therapy including SBRT to his left upper lobe as well as multiple treatments to head and neck for stage IV squamous cell carcinoma the tongue base.  Have been treated with [facility] although started having significant pain in his right neck and CT scan showed C3 destructive metastatic lesion eroding the pedicle and facet with tumor filling the right C4 neural foramina.  He is now 1 month out from palliative radiation to this area.  He states he is having no significant pain at this time.  He does look frail.Marland Kitchen  He recently took a fall CT scan did show destruction C4 which is area we have previously treated.  He is being referred to neurosurgery although doubtful of any surgical procedure to stabilize his neck at this time based on his advanced stage IV disease.  He may be reestablishing care with [facility].  COMPLICATIONS OF TREATMENT: none  FOLLOW UP COMPLIANCE: keeps appointments   PHYSICAL EXAM:  BP 129/73    Pulse 69    Temp 97.6 F (36.4 C) (Tympanic)    Wt 100 lb 9.6 oz (45.6 kg)    BMI 14.86 kg/m  Frail-appearing male in NAD range of motion neck does not elicit pain.  Motor or sensory in detail levels are equal and symmetric in upper lower extremities.  Well-developed well-nourished patient in NAD. HEENT reveals PERLA, EOMI, discs not visualized.  Oral cavity is clear. No oral mucosal lesions are identified. Neck is clear without evidence of cervical or supraclavicular adenopathy. Lungs are clear to A&P. Cardiac examination is  essentially unremarkable with regular rate and rhythm without murmur rub or thrill. Abdomen is benign with no organomegaly or masses noted. Motor sensory and DTR levels are equal and symmetric in the upper and lower extremities. Cranial nerves II through XII are grossly intact. Proprioception is intact. No peripheral adenopathy or edema is identified. No motor or sensory levels are noted. Crude visual fields are within normal range.  RADIOLOGY RESULTS: CT scans reviewed compatible with above-stated findings  PLAN: At this time I will turn follow-up care over to medical oncology.  Be happy to reevaluate patient anytime should further palliative treatment indicated.  Patient and wife both comprehend my recommendations well.  I would like to take this opportunity to thank you for allowing me to participate in the care of your patient.Noreene Filbert, MD

## 2021-02-28 NOTE — Telephone Encounter (Signed)
Called a couple times, still no call back, so scheduled pt to see [name]. He will see her on Friday.

## 2021-03-01 ENCOUNTER — Encounter: Payer: Self-pay | Admitting: Oncology

## 2021-03-02 ENCOUNTER — Inpatient Hospital Stay: Payer: Medicare Other | Attending: Oncology

## 2021-03-02 ENCOUNTER — Encounter: Payer: Self-pay | Admitting: Oncology

## 2021-03-02 ENCOUNTER — Inpatient Hospital Stay (HOSPITAL_BASED_OUTPATIENT_CLINIC_OR_DEPARTMENT_OTHER): Payer: Medicare Other | Admitting: Oncology

## 2021-03-02 ENCOUNTER — Inpatient Hospital Stay: Payer: Medicare Other

## 2021-03-02 ENCOUNTER — Other Ambulatory Visit: Payer: Self-pay

## 2021-03-02 VITALS — BP 134/99 | HR 66 | Temp 97.4°F | Wt 101.3 lb

## 2021-03-02 DIAGNOSIS — C109 Malignant neoplasm of oropharynx, unspecified: Secondary | ICD-10-CM | POA: Insufficient documentation

## 2021-03-02 DIAGNOSIS — Z79899 Other long term (current) drug therapy: Secondary | ICD-10-CM | POA: Diagnosis not present

## 2021-03-02 DIAGNOSIS — C329 Malignant neoplasm of larynx, unspecified: Secondary | ICD-10-CM

## 2021-03-02 DIAGNOSIS — Z5112 Encounter for antineoplastic immunotherapy: Secondary | ICD-10-CM | POA: Insufficient documentation

## 2021-03-02 DIAGNOSIS — Z1329 Encounter for screening for other suspected endocrine disorder: Secondary | ICD-10-CM

## 2021-03-02 DIAGNOSIS — C3412 Malignant neoplasm of upper lobe, left bronchus or lung: Secondary | ICD-10-CM | POA: Diagnosis not present

## 2021-03-02 DIAGNOSIS — F1027 Alcohol dependence with alcohol-induced persisting dementia: Secondary | ICD-10-CM

## 2021-03-02 DIAGNOSIS — C320 Malignant neoplasm of glottis: Secondary | ICD-10-CM | POA: Insufficient documentation

## 2021-03-02 DIAGNOSIS — C3492 Malignant neoplasm of unspecified part of left bronchus or lung: Secondary | ICD-10-CM | POA: Diagnosis not present

## 2021-03-02 DIAGNOSIS — C7951 Secondary malignant neoplasm of bone: Secondary | ICD-10-CM | POA: Insufficient documentation

## 2021-03-02 LAB — COMPREHENSIVE METABOLIC PANEL
ALT: 17 U/L (ref 0–44)
AST: 20 U/L (ref 15–41)
Albumin: 3.8 g/dL (ref 3.5–5.0)
Alkaline Phosphatase: 69 U/L (ref 38–126)
Anion gap: 7 (ref 5–15)
BUN: 17 mg/dL (ref 8–23)
CO2: 27 mmol/L (ref 22–32)
Calcium: 9.1 mg/dL (ref 8.9–10.3)
Chloride: 103 mmol/L (ref 98–111)
Creatinine, Ser: 0.76 mg/dL (ref 0.61–1.24)
GFR, Estimated: >60 mL/min (ref >60)
Glucose, Bld: 105 mg/dL — ABNORMAL HIGH (ref 70–99)
Potassium: 3.5 mmol/L (ref 3.5–5.1)
Sodium: 137 mmol/L (ref 135–145)
Total Bilirubin: 0.8 mg/dL (ref 0.3–1.2)
Total Protein: 6.8 g/dL (ref 6.5–8.1)

## 2021-03-02 LAB — T4, FREE: Free T4: 1 ng/dL (ref 0.61–1.12)

## 2021-03-02 LAB — CBC WITH DIFFERENTIAL/PLATELET
Abs Immature Granulocytes: 0.02 10*3/uL (ref 0.00–0.07)
Basophils Absolute: 0 10*3/uL (ref 0.0–0.1)
Basophils Relative: 0 %
Eosinophils Absolute: 0.1 10*3/uL (ref 0.0–0.5)
Eosinophils Relative: 3 %
HCT: 34 % — ABNORMAL LOW (ref 39.0–52.0)
Hemoglobin: 10.8 g/dL — ABNORMAL LOW (ref 13.0–17.0)
Immature Granulocytes: 0 %
Lymphocytes Relative: 13 %
Lymphs Abs: 0.6 10*3/uL — ABNORMAL LOW (ref 0.7–4.0)
MCH: 29.1 pg (ref 26.0–34.0)
MCHC: 31.8 g/dL (ref 30.0–36.0)
MCV: 91.6 fL (ref 80.0–100.0)
Monocytes Absolute: 0.5 10*3/uL (ref 0.1–1.0)
Monocytes Relative: 11 %
Neutro Abs: 3.5 10*3/uL (ref 1.7–7.7)
Neutrophils Relative %: 73 %
Platelets: 196 10*3/uL (ref 150–400)
RBC: 3.71 MIL/uL — ABNORMAL LOW (ref 4.22–5.81)
RDW: 13.7 % (ref 11.5–15.5)
WBC: 4.8 10*3/uL (ref 4.0–10.5)
nRBC: 0 % (ref 0.0–0.2)

## 2021-03-02 LAB — TSH: TSH: 3.347 u[IU]/mL (ref 0.350–4.500)

## 2021-03-02 MED ORDER — SODIUM CHLORIDE 0.9 % IV SOLN
Freq: Once | INTRAVENOUS | Status: AC
  Filled 2021-03-02: qty 250

## 2021-03-02 MED ORDER — HEPARIN SOD (PORK) LOCK FLUSH 100 UNIT/ML IV SOLN
INTRAVENOUS | Status: AC
  Filled 2021-03-02: qty 5

## 2021-03-02 MED ORDER — SODIUM CHLORIDE 0.9 % IV SOLN
200.0000 mg | Freq: Once | INTRAVENOUS | Status: AC
  Administered 2021-03-02: 200 mg via INTRAVENOUS
  Filled 2021-03-02: qty 8

## 2021-03-02 NOTE — Progress Notes (Signed)
Hematology/Oncology progress note  [phone] [phone]   [name], [name], [name], [name]  HISTORY OF PRESENTING ILLNESS:  History of prostate cancer. S/p Radiation/seed, he follows up with [facility].  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  PD-L1-IHC 40%, no targetable mutations. # 09/07/2019 chemotherapy [cisplatin] and radiation.   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  Interval decrease of the posterior left upper lobe pleural-based nodule with interval decrease in size of the tiny adjacent satellite nodule.  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  No recurrent tumor in right piriform sinus.  Right lateral pharyngeal lymph node is stable.  No new or recurrent adenopathy.Suspect metastasis.  I recommend patient to obtain PET scan restaging.  I discussed about the plan of re-biopsy of either the paraspinal soft tissue mass versus other more feasible hypermetabolic sites detected on PET scan. Patient was seen by Dr. Kennyth Burton and opted to proceed with palliative radiation as soon as possible.  PET scan cannot be arranged prior to the radiation so the PET scan was canceled. 04/20/2020 -04/25/2020, palliative radiation to thoracic spine  05/10/2020, MRI  thoracic spine was obtained for worsening of back pain and left shoulder pain.  Images showed metastatic disease throughout almost the entire T2 with and associated mild superior endplate compression fracture.  Abnormal signal in the anterior, inferior endplate of T1, superior aspect of the T3.  Small metastatic deposit in T12 is also noted. Case was also discussed on multidisciplinary tumor board on 05/25/2020 05/22/2020, PET scan showed new hypermetabolic lytic bone metastasis in the left scapular near the glenoid and in the T2 vertebral body.  Persistent hypermetabolic right lateral retropharyngeal and right level 3 neck lymph node metastasis.  Peripheral left upper lobe pulmonary nodule is mildly decreased with new surrounding mild platelike postradiation changes..  No residual recurrent neoplasm at the site of vomiting in the right piriform sinus  06/07/2020 -06/09/2020 patient underwent palliative radiation to left scapular April 2022 seen by Dr. Tami Burton and flexible laryngoscope examination showed new lesion at the right false vocal cord.  Case was discussed on tumor board on 06/22/2020. Dr. Tami Burton recommend additional CT soft tissue neck with contrast for additional evaluation. 06/29/2020 CT images were reviewed and discussed with patient. There is new malignant lymph node in the right mid neck at the level of the hyoid, this compressing the right jugular vein with thrombus in the vein above and below the lymph node.  Cystic retropharyngeal lymph node on the right is stable.  Progressive soft tissue thickening in the larynx bilaterally suspicious for tumor.  Metastatic disease at the T2 is unchanged.  Metastatic deposit left scapula there is now a pathological fracture of the scapular. CT scan findings were discussed with ENT Dr. Tami Burton via secure chat.  Dr. Tami Burton feels surgery is not feasible.  The larynx  lesion currently does not compromise his airway however further progression may in the  future  07/10/2020 Seen by Radonc GregoryChrystal who would offer salvage radiation to his larynx as well as neck adenopathy. Would like low dose chemotherapy as sensitizer.    Patient was also referred to establish care with Gregory Burton at [facility] for kyphoplasty.  Patient's wife reports that they were never contacted and she called Gregory Burton office with no success.  # NGS.  PD-L1-IHC 40%, no targetable mutations # 07/25/20- 07/27/2020, 08/01/20- 09/06/2020  concurrent chemoradiation [low-dose weekly cisplatin 30m/m2 ] to the larynx  09/15/2020, patient was seen by ENT Gregory Burton  Laryngoscope examination reviewed clear larynx, no lesion was discovered. 10/04/2020, started on maintenance Keytruda. 12/11/2020 restaging CT 1. Progression of Disease: - new since May right C3 destructive osseous metastasis has eroded the right pedicle and facet, with tumor filling the right C4 neural foramen and engulfing the right vertebral artery which remains patent. Mild right lateral epidural tumor. - small (8 mm short axis) but progressed right level 2A nodal disease with evidence of extracapsular extension. 2. 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  3. No recurrent laryngeal or pharyngeal tumor identified.  4. Severe carotid atherosclerosis and stenosis greater on the right.Right ICA remains patent. 5. CT Chest, Abdomen, and Pelvis the same day are reported separately.  Patient was seen by Dr. CBaruch Goutyon 12/29/2020, patient received additional palliative radiation to his C-spine.  Finished on 01/17/2021.  Patient had a fall and went to emergency room on 02/05/2021.   CT cervical spine without contrast showed stable destructive process in the right side of the T3 vertebral body.  Progressive destructive process in the right side and the posterior element of C4.  No fracture.  INTERVAL HISTORY RTome Wilsonis a 81y.o. male who has above history reviewed by me today presents for follow-up of head and neck cancer and lung cancer Patient was accompanied by wife.  He tolerates Keytruda.   Appetite is not good.  Patient has Megace and the patient is not taking.   Review of Systems  Constitutional:  Positive for fatigue. Negative for appetite change, chills, fever and unexpected weight change.  HENT:   Positive for hearing loss.   Eyes:  Negative for eye problems and icterus.  Respiratory:  Negative for chest tightness, cough and shortness of breath.   Cardiovascular:  Negative for chest pain and leg swelling.  Gastrointestinal:  Negative for abdominal distention, abdominal pain, blood in stool and nausea.  Endocrine: Negative for hot flashes.  Genitourinary:  Negative for difficulty urinating, dysuria and frequency.   Musculoskeletal:  Positive for arthralgias.  Skin:  Negative for itching and rash.  Neurological:  Negative for extremity weakness, light-headedness and numbness.  Hematological:  Negative for adenopathy. Does not bruise/bleed easily.  Psychiatric/Behavioral:  Negative for confusion.        Forgetful   MEDICAL HISTORY:  Past Medical History:  Diagnosis Date   Alcohol abuse    Blood transfusion without reported diagnosis 2013   Cataract    Dementia ([place]    Glaucoma    Gout    Hypercholesteremia    Hypertension    [place] cancer ([place] 07/14/2020   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 02[place]   [place]   Squamous cell lung cancer [place] 06/23/2019    SURGICAL HISTORY: Past Surgical History:  Procedure Laterality Date  BRAIN SURGERY  2012   HERNIA REPAIR     PORTA CATH INSERTION N/A 07/27/2019   Procedure: PORTA CATH INSERTION;  Surgeon: Katha Cabal, MD;  Location: [facility];  Service: Cardiovascular;  Laterality: N/A;    SOCIAL HISTORY: Social History   Socioeconomic History   Marital status: Married    Spouse name: Not on file   Number  of children: Not on file   Years of education: Not on file   Highest education level: Not on file  Occupational History   Not on file  Tobacco Use   Smoking status: Former    Years: 21.00    Types: Cigarettes    Quit date: 05/11/2008    Years since quitting: 12.8   Smokeless tobacco: Never  Vaping Use   Vaping Use: Never used  Substance and Sexual Activity   Alcohol use: Yes    Comment: 0.5 pint liquor a week   Drug use: Never   Sexual activity: Not Currently  Other Topics Concern   Not on file  Social History Narrative   Lives at home with wife. Wife states he has some dementia but is able to sign his own consent.   Social Determinants of Health   Financial Resource Strain: Not on file  Food Insecurity: Not on file  Transportation Needs: Not on file  Physical Activity: Not on file  Stress: Not on file  Social Connections: Not on file  Intimate Partner Violence: Not on file    FAMILY HISTORY: History reviewed. No pertinent family history.  ALLERGIES:  is allergic to enalapril.  MEDICATIONS:  Current Outpatient Medications  Medication Sig Dispense Refill   atorvastatin (LIPITOR) 20 MG tablet Take 20 mg by mouth daily.     donepezil (ARICEPT) 10 MG tablet Take 10 mg by mouth at bedtime.     dorzolamide-timolol (COSOPT) 22.3-6.8 MG/ML ophthalmic solution Place 1 drop into both eyes 2 (two) times daily.      ELIQUIS 2.5 MG TABS tablet TAKE 1 TABLET BY MOUTH TWICE A DAY 60 tablet 1   folic acid (FOLVITE) 1 MG tablet Take 1 mg by mouth daily.     latanoprost (XALATAN) 0.005 % ophthalmic solution Place 1 drop into both eyes at bedtime.      lidocaine-prilocaine (EMLA) cream Apply 1 application topically as needed. Apply small amount to port site approx 1-2 hours prior to appointment. 30 g 1   MAG64 64 MG TBEC TAKE 1 TABLET (64 MG TOTAL) BY MOUTH 2 (TWO) TIMES DAILY. 180 tablet 0   Multiple Vitamins-Minerals (CENTRUM ADULTS PO) Take by mouth.     nystatin (MYCOSTATIN)  100000 UNIT/ML suspension TAKE 5 MLS (500,000 UNITS TOTAL) BY MOUTH 4 (FOUR) TIMES DAILY. 480 mL 1   oxyCODONE (OXY IR/ROXICODONE) 5 MG immediate release tablet Take 1 tablet (5 mg total) by mouth every 4 (four) hours as needed for severe pain. 90 tablet 0   Thiamine HCl (VITAMIN B-1 PO) Take 100 mg by mouth daily.     colchicine 0.6 MG tablet Take 0.6 mg by mouth daily. SMARTSIG:1 Tablet(s) By Mouth Daily (Patient not taking: Reported on 02/06/2021)     magic mouthwash w/lidocaine SOLN Take 5 mLs by mouth 3 (three) times daily as needed for mouth pain. (Patient not taking: Reported on 02/06/2021) 5 mL 5   megestrol (MEGACE) 40 MG tablet TAKE 1 TABLET BY MOUTH TWICE A DAY (Patient not taking: Reported on 02/06/2021) 60 tablet 0   No current  facility-administered medications for this visit.   Facility-Administered Medications Ordered in Other Visits  Medication Dose Route Frequency Provider Last Rate Last Admin   sodium chloride flush (NS) 0.9 % injection 10 mL  10 mL Intravenous PRN Gregory Server, MD   10 mL at 03/01/20 0812     PHYSICAL EXAMINATION: ECOG PERFORMANCE STATUS: 1 - Symptomatic but completely ambulatory Vitals:   03/02/21 0836  BP: (!) 134/99  Pulse: 66  Temp: (!) 97.4 F (36.3 C)   Filed Weights   03/02/21 0836  Weight: 101 lb 4.8 oz (45.9 kg)    Physical Exam Constitutional:      General: He is not in acute distress. HENT:     Head: Normocephalic and atraumatic.  Eyes:     General: No scleral icterus. Cardiovascular:     Rate and Rhythm: Normal rate and regular rhythm.     Heart sounds: Normal heart sounds.  Pulmonary:     Effort: Pulmonary effort is normal. No respiratory distress.     Breath sounds: No wheezing.  Abdominal:     General: Bowel sounds are normal. There is no distension.     Palpations: Abdomen is soft.  Musculoskeletal:        General: No deformity.     Cervical back: Normal range of motion and neck supple.  Lymphadenopathy:     Cervical: No  cervical adenopathy.  Skin:    General: Skin is warm and dry.     Findings: No erythema or rash.  Neurological:     Mental Status: He is alert. Mental status is at baseline.     Cranial Nerves: No cranial nerve deficit.     Coordination: Coordination normal.    LABORATORY DATA:  I have reviewed the data as listed Lab Results  Component Value Date   WBC 4.8 03/02/2021   HGB 10.8 (L) 03/02/2021   HCT 34.0 (L) 03/02/2021   MCV 91.6 03/02/2021   PLT 196 03/02/2021   Recent Labs    12/27/20 1335 01/11/21 0918 01/11/21 1351 02/05/21 1653 03/02/21 0742  NA 141 SPECIMEN CONTAMINATED, UNABLE TO PERFORM TEST(S). 135 139 137  K 4.1 SPECIMEN CONTAMINATED, UNABLE TO PERFORM TEST(S). 4.8 4.4 3.5  CL 108 SPECIMEN CONTAMINATED, UNABLE TO PERFORM TEST(S). 105 103 103  CO2 27 SPECIMEN CONTAMINATED, UNABLE TO PERFORM TEST(S). 24 29 27   GLUCOSE 117* SPECIMEN CONTAMINATED, UNABLE TO PERFORM TEST(S). 85 134* 105*  BUN 23 SPECIMEN CONTAMINATED, UNABLE TO PERFORM TEST(S). 14 19 17   CREATININE 0.92 SPECIMEN CONTAMINATED, UNABLE TO PERFORM TEST(S). 0.72 0.91 0.76  CALCIUM 9.1 SPECIMEN CONTAMINATED, UNABLE TO PERFORM TEST(S). 9.2 9.7 9.1  GFRNONAA >60 SPECIMEN CONTAMINATED, UNABLE TO PERFORM TEST(S). >60 >60 >60  PROT 7.4 3.8*  --   --  6.8  ALBUMIN 3.9 2.0*  --   --  3.8  AST 18 9*  --   --  20  ALT 14 8  --   --  17  ALKPHOS 61 30*  --   --  69  BILITOT 0.6 0.5  --   --  0.8  BILIDIR  --  <0.1  --   --   --   IBILI  --  NOT CALCULATED  --   --   --     Iron/TIBC/Ferritin/ %Sat No results found for: IRON, TIBC, FERRITIN, IRONPCTSAT    RADIOGRAPHIC STUDIES: I have personally reviewed the radiological images as listed and agreed with the findings in the report. CT HEAD WO CONTRAST (5MM)  Result Date: 02/05/2021 CLINICAL DATA:  Fall.  Head trauma, moderate-severe EXAM: CT HEAD WITHOUT CONTRAST TECHNIQUE: Contiguous axial images were obtained from the base of the skull through the vertex  without intravenous contrast. COMPARISON:  None. FINDINGS: Brain: There is atrophy and chronic small vessel disease changes. No acute intracranial abnormality. Specifically, no hemorrhage, hydrocephalus, mass lesion, acute infarction, or significant intracranial injury. Vascular: No hyperdense vessel or unexpected calcification. Skull: No acute calvarial abnormality. Postoperative changes in the frontal regions bilaterally. Sinuses/Orbits: No acute findings Other: None IMPRESSION: Atrophy, chronic microvascular disease. No acute intracranial abnormality. Electronically Signed   By: Rolm Baptise M.D.   On: 02/05/2021 18:22   CT SOFT TISSUE NECK W CONTRAST  Result Date: 12/12/2020 CLINICAL DATA:  81 year old male with a history of oropharyngeal carcinoma, left upper lobe squamous cell lung carcinoma, thoracic spine metastases. EXAM: CT NECK WITH CONTRAST TECHNIQUE: Multidetector CT imaging of the neck was performed using the standard protocol following the bolus administration of intravenous contrast. CONTRAST:  147m OMNIPAQUE IOHEXOL 300 MG/ML SOLN in conjunction with contrast enhanced imaging of the chest, abdomen, and pelvis reported separately. COMPARISON:  CT Chest, Abdomen, and Pelvis from the same day reported separately. Neck CT 06/29/2020.  PET-CT 05/22/2020. FINDINGS: Pharynx and larynx: Pharyngeal and pharyngeal soft tissue contours are within normal limits. Supraglottic laryngeal soft tissue thickening has regressed since May, might be sequelae of radiation. No discrete hyperenhancement or mass. Retropharyngeal effusion has mildly increased. Parapharyngeal spaces are stable. Salivary glands: Post radiation changes, otherwise negative. Thyroid: Negative. Lymph nodes: Post treatment architectural distortion at the right level 2 nodal station, and nearby cystic right retropharyngeal node (series 1, not significantly changed from May. However, and irregular and enhancing 7-8 mm short axis soft tissue  nodule at the right level 2 B station has increased since that time suspicious for progressed metastatic nodal disease on series 1, image 30. This was hypermetabolic and smaller on the March PET-CT. Right level IIIa nodal station indistinct, ex nodal tumor on series 1, image 56 appears stable since May, with hypermetabolic in March. Small but increased level 1 lymph nodes are up to 5 mm short axis and indeterminate. Other nodal stations appear stable. Vascular: Chronic segmental right jugular vein thrombosis secondary to the lymph node disease above. Right side Port-A-Cath remains in place. Severe chronic carotid calcified atherosclerosis with high-grade string sign stenosis on the right, but the right ICA remains patent. Right vertebral artery remains patent although is now engulfed by tumor at the C3 level. See series 1, image 36 and osseous details below. Limited intracranial: Stable. Calcified atherosclerosis at the skull base. Visualized orbits: Negative. Mastoids and visualized paranasal sinuses: Clear. Skeleton: New since May destructive osseous lesion involving the right lateral body and posterior elements of C3. See series 7, image 67 and series 1, images 35 and 36. Tumor here encompasses up to 26 mm diameter and engulfs the right vertebral artery which remains patent. The right pedicle and a portion of the facet is eroded. Cervical vertebral alignment is maintained. The right C4 neural foramen is fiilled with tumor and there is mild right lateral epidural tumor. Superimposed cervical spine degeneration including chronic ankylosis of the right C2-C3 facet. Congenital incomplete ossification of the posterior C1 ring. No other osseous metastatic disease identified in the neck. Absent and carious dentition. Chronic T2 right side vertebral body metastasis, see chest CT reported separately. Upper chest: Reported separately. IMPRESSION: 1. Progression of Disease: - new since May right C3 destructive osseous  metastasis has eroded the right pedicle and facet, with tumor filling the right C4 neural foramen and engulfing the right vertebral artery which remains patent. Mild right lateral epidural tumor. - small (8 mm short axis) but progressed right level 2A nodal disease with evidence of extracapsular extension. 2. 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 3. No recurrent laryngeal or pharyngeal tumor identified. 4. Severe carotid atherosclerosis and stenosis greater on the right. Right ICA remains patent. 5. CT Chest, Abdomen, and Pelvis the same day are reported separately. Electronically Signed   By: Genevie Ann M.D.   On: 12/12/2020 08:59   CT Cervical Spine Wo Contrast  Result Date: 02/05/2021 CLINICAL DATA:  Fall. Neck trauma. History of or pharyngeal carcinoma and left upper lobe squamous cell lung carcinoma. EXAM: CT CERVICAL SPINE WITHOUT CONTRAST TECHNIQUE: Multidetector CT imaging of the cervical spine was performed without intravenous contrast. Multiplanar CT image reconstructions were also generated. COMPARISON:  Neck CT 12/11/2020 FINDINGS: Alignment: No subluxation. Skull base and vertebrae: Destructive lesions in the right side of the C4 vertebral body and posterior elements and T2 vertebral body are again noted. T2 is stable. Progressive destruction noted in the right side of C4 compatible with metastatic disease. No fracture. Soft tissues and spinal canal: No prevertebral fluid or swelling. No visible canal hematoma. Disc levels: Advanced diffuse degenerative disc disease and facet disease bilaterally. Upper chest: Cystic area/lesion in the left upper lobe measures 14 mm, stable. Other: No acute findings IMPRESSION: Stable destructive process in the right side of the T2 vertebral body. Progressive destructive process in the right side and posterior elements of C4. Advanced diffuse degenerative  disc and facet disease. No fracture. Electronically Signed   By: Rolm Baptise M.D.   On: 02/05/2021 18:21   CT CHEST ABDOMEN PELVIS W CONTRAST  Result Date: 12/12/2020 CLINICAL DATA:  Follow-up left upper lobe squamous cell carcinoma, metastatic oropharyngeal carcinoma, restaging EXAM: CT CHEST, ABDOMEN, AND PELVIS WITH CONTRAST TECHNIQUE: Multidetector CT imaging of the chest, abdomen and pelvis was performed following the standard protocol during bolus administration of intravenous contrast. CONTRAST:  191m OMNIPAQUE IOHEXOL 300 MG/ML  SOLN COMPARISON:  PET-CT, 05/22/2020 FINDINGS: CT CHEST FINDINGS Cardiovascular: Right chest port catheter. Aortic atherosclerosis. Normal heart size. Extensive 3 vessel coronary artery calcifications and/or stents status post CABG. No pericardial effusion. Mediastinum/Nodes: No enlarged mediastinal, hilar, or axillary lymph nodes. Thyroid gland, trachea, and esophagus demonstrate no significant findings. Lungs/Pleura: Expected interval evolution of radiation fibrosis about a treated nodule of the peripheral left upper lobe, measuring approximately 2.7 x 2.3 cm, not significantly changed (series 7, image 59). Bilateral calcified pleural plaques. No pleural effusion or pneumothorax. Musculoskeletal: No chest wall mass. Redemonstrated lytic lesion of the right aspect of T2 (series 9, image 128) as well as the left scapula near the coronoid process and glenoid (series 7, image 10). CT ABDOMEN PELVIS FINDINGS Hepatobiliary: No solid liver abnormality is seen. No gallstones, gallbladder wall thickening, or biliary dilatation. Pancreas: Unremarkable. No pancreatic ductal dilatation or surrounding inflammatory changes. Spleen: Normal in size without significant abnormality. Adrenals/Urinary Tract: Adrenal glands are unremarkable. Kidneys are normal, without renal calculi, solid lesion, or hydronephrosis. Bladder is unremarkable. Stomach/Bowel: Stomach is within normal limits.  Appendix appears normal. No evidence of bowel wall thickening, distention, or inflammatory changes. Sigmoid diverticula. Vascular/Lymphatic: Aortic atherosclerosis. No enlarged abdominal or pelvic lymph nodes. Reproductive: Prostate brachytherapy pellets. Other: No abdominal wall  hernia or abnormality. No abdominopelvic ascites. Musculoskeletal: No acute or significant osseous findings. IMPRESSION: 1. Expected interval evolution of radiation fibrosis about a treated nodule of the peripheral left upper lobe, measuring approximately 2.7 x 2.3 cm, not significantly changed. 2. Redemonstrated lytic osseous metastatic lesions of the right aspect of T2 as well as the left scapula. No new lesions identified by CT. 3. No evidence of metastatic disease in the abdomen or pelvis. 4. Prostate brachytherapy. 5. Coronary artery disease. 6. Sigmoid diverticulosis. Aortic Atherosclerosis (ICD10-I70.0). Electronically Signed   By: Delanna Ahmadi M.D.   On: 12/12/2020 10:46   DG Chest Portable 1 View  Result Date: 01/11/2021 CLINICAL DATA:  Weakness.  Stage IV cancer. EXAM: PORTABLE CHEST 1 VIEW COMPARISON:  06/29/2019.  CT 12/11/2020. FINDINGS: Power port on the right with tip in the proximal right atrium. Focal density in the left lateral chest related to previous mass with subsequent radiation change. See results of CT scan 12/11/2020. Question mild patchy infiltrate in the right lower lobe. Bilateral nipple shadows and bilateral pleural calcifications remain visible. IMPRESSION: Focal density in the left lateral midlung related to tumor with previous radiation treatment change. Question mild patchy pneumonia at the right lung base. Bilateral nipple shadows and pleural calcifications as seen previously. Electronically Signed   By: Nelson Chimes M.D.   On: 01/11/2021 11:36    ASSESSMENT & PLAN:  1. 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 Squamous cell carcinoma of left lung ([place])   3. Larynx cancer ([place])   4. Hypomagnesemia   5.  Bone metastasis ([place])   6. 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 Encounter for antineoplastic immunotherapy     Cancer Staging  Larynx cancer [facility]) Staging form: Larynx - Glottis, AJCC 8th Edition - Clinical: Stage IVC (cT1, cNX, cM1) - Signed by Gregory Server, MD on 07/26/2020  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[facility]) Staging form: Pharynx - P16 Negative Oropharynx, AJCC 8th Edition - Clinical stage from 06/23/2019: Stage IVB (cT4b, cN2b, cM0, p16-) - Signed by Gregory Server, MD on 06/23/2019  Squamous cell lung cancer ([place]) Staging form: Lung, AJCC 8th Edition - Clinical: cT1, cN0, cM0 - Signed by Gregory Server, MD on 07/19/2019   #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 01/16/2021, patient underwent additional palliative radiation to C-spine. CT cervical spine 02/05/2021 showed stable destructive process of T2.  Progressive destructive process of C4. Labs are reviewed and discussed with patient.  Continue Keytruda for now.-Patient has borderline performance status, previously had difficulties tolerating chemotherapy. Neurosurgery evaluation for feasibility of intervention options.  Discussed with Dr. YCari CarawayRepeat CT chest abdomen pelvis with contrast as well as CT neck with contrast.  #Hypomagnesia, continue oral Slow-Mag, continue 1 tablet twice daily.    #Left upper lobe squamous cell carcinoma, stage I Finished SBRT on 02/07/2020.  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  We will decide duration of  anticoagulation after reimaging.  #Weight loss, weight has been stable.  Continue nutrition supplementation.    all questions were answered. The patient knows to call the clinic with any problems questions or concerns.  Return of visit: 3 weeks lab MD [place]. Gregory Server, MD, PhD 03/02/2021

## 2021-03-02 NOTE — Patient Instructions (Signed)
[facility] CTR AT [place]  Discharge Instructions: Thank you for choosing [facility] to provide your oncology and hematology care.  If you have a lab appointment with the [facility], please go directly to the [facility] and check in at the registration area.  Wear comfortable clothing and clothing appropriate for easy access to any Portacath or PICC line.   We strive to give you quality time with your provider. You may need to reschedule your appointment if you arrive late (15 or more minutes).  Arriving late affects you and other patients whose appointments are after yours.  Also, if you miss three or more appointments without notifying the office, you may be dismissed from the clinic at the providers discretion.      For prescription refill requests, have your pharmacy contact our office and allow 72 hours for refills to be completed.    Today you received the following chemotherapy and/or immunotherapy agents : Keytruda   To help prevent nausea and vomiting after your treatment, we encourage you to take your nausea medication as directed.  BELOW ARE SYMPTOMS THAT SHOULD BE REPORTED IMMEDIATELY: *FEVER GREATER THAN 100.4 F (38 C) OR HIGHER *CHILLS OR SWEATING *NAUSEA AND VOMITING THAT IS NOT CONTROLLED WITH YOUR NAUSEA MEDICATION *UNUSUAL SHORTNESS OF BREATH *UNUSUAL BRUISING OR BLEEDING *URINARY PROBLEMS (pain or burning when urinating, or frequent urination) *BOWEL PROBLEMS (unusual diarrhea, constipation, pain near the anus) TENDERNESS IN MOUTH AND THROAT WITH OR WITHOUT PRESENCE OF ULCERS (sore throat, sores in mouth, or a toothache) UNUSUAL RASH, SWELLING OR PAIN  UNUSUAL VAGINAL DISCHARGE OR ITCHING   Items with * indicate a potential emergency and should be followed up as soon as possible or go to the Emergency Department if any problems should occur.  Please show the CHEMOTHERAPY ALERT CARD or IMMUNOTHERAPY ALERT CARD at check-in to the  Emergency Department and triage nurse.  Should you have questions after your visit or need to cancel or reschedule your appointment, please contact [facility]CER [place] AT [place]  [phone] and follow the prompts.  Office hours are 8:00 a.m. to 4:30 p.m. Monday - Friday. Please note that voicemails left after 4:00 p.m. may not be returned until the following business day.  We are closed weekends and major holidays. You have access to a nurse at all times for urgent questions. Please call the main number to the clinic [phone] and follow the prompts.  For any non-urgent questions, you may also contact your provider using MyChart. We now offer e-Visits for anyone 70 and older to request care online for non-urgent symptoms. For details visit [email or web address].   Also download the MyChart app! Go to the app store, search "MyChart", open the app, select [facility], and log in with your MyChart username and password.  Due to Covid, a mask is required upon entering the hospital/clinic. If you do not have a mask, one will be given to you upon arrival. For doctor visits, patients may have 1 support person aged 75 or older with them. For treatment visits, patients cannot have anyone with them due to current Covid guidelines and our immunocompromised population.

## 2021-03-02 NOTE — Progress Notes (Signed)
Nutrition Follow-up:  Patient with metastatic head and neck cancer.  Patient has completed radiation.  Patient receiving [place].  Met with wife while patient in infusion as patient with dementia.  Wife says that patient eats but small amounts.  Sometimes patient says he does not want anything to eat but wife still tries to prepare food for him.  Says yesterday he was able to eat canned ravioli and milkshake (milk, ice cream, chocolate syrup and banana).  Drank and ensure before coming today.  Likes mashed potatoes per wife.        Medications: Not taking megace per wife.    Labs: reviewed  Anthropometrics:   Weight 101 lb 4.8 oz today  103 lb 8 oz on 11/2 107 lb on 10/12 107 lb on 9/21 104 lb on 8/10 104 lb on 7/13 107 lb on 6/8 109 lb on 6/1 112 lb on 4/1  NUTRITION DIAGNOSIS: Inadequate oral intake decreased   INTERVENTION:  Encouraged ensure plus or equivalent 3-4 time per day if able Wife requested recipes for shakes and provided Wife aware of ways to add calories to food Trial of appetite stimulant maybe an option and will reach out to provider.  Currently on Eliquis for right jugular vein thrombus.    MONITORING, EVALUATION, GOAL: weight trends, intake   NEXT VISIT: to be determined with treatment  Azalynn Maxim B. Zenia Resides, [place], [place] Registered Dietitian [phone] (mobile)

## 2021-03-05 ENCOUNTER — Telehealth: Payer: Self-pay

## 2021-03-05 DIAGNOSIS — C109 Malignant neoplasm of oropharynx, unspecified: Secondary | ICD-10-CM

## 2021-03-05 LAB — MISC LABCORP TEST (SEND OUT): Labcorp test code: 650003

## 2021-03-05 NOTE — Telephone Encounter (Signed)
Please schedule STAT MRI cervical spine w/wo and inform pt of appt. Wife informed of plan and states they will need Lucianne Lei pickup too. Please call her with appt details.

## 2021-03-06 ENCOUNTER — Other Ambulatory Visit: Payer: Self-pay

## 2021-03-06 ENCOUNTER — Ambulatory Visit
Admission: RE | Admit: 2021-03-06 | Discharge: 2021-03-06 | Disposition: A | Discharge status: Home / Self Care | Payer: Medicare Other | Source: Ambulatory Visit | Attending: Oncology | Admitting: Oncology

## 2021-03-06 ENCOUNTER — Encounter: Payer: Self-pay | Admitting: Oncology

## 2021-03-06 DIAGNOSIS — C109 Malignant neoplasm of oropharynx, unspecified: Secondary | ICD-10-CM | POA: Insufficient documentation

## 2021-03-06 IMAGING — MR MR CERVICAL SPINE WO/W CM
5 of 8 series · 28 of 48 positions shown · IV contrast (gadavist)
Comparison: Prior CT imaging

CLINICAL DATA: Head and neck cancer, C spine lesion

EXAM:
MRI CERVICAL SPINE WITHOUT AND WITH CONTRAST
TECHNIQUE: Multiplanar and multiecho pulse sequences of the cervical spine, to
include the craniocervical junction and cervicothoracic junction,
were obtained without and with intravenous contrast.
CONTRAST:  4mL GADAVIST GADOBUTROL 1 MMOL/ML IV SOLN

[Series 5: T2 · sagittal · 3.0mm · 0.62mm/px · 4 of 15 slices shown (1 of 2)]
[im 1/15]
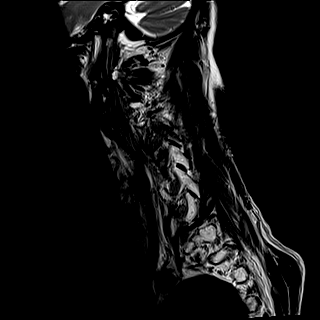
[im 5/15]
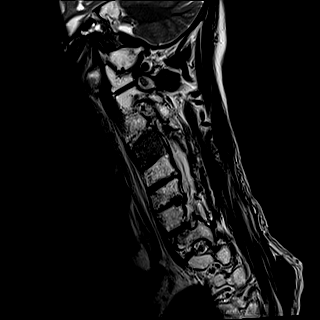
[im 10/15]
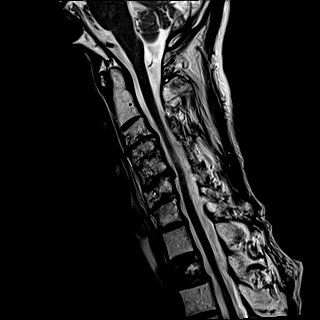
[im 15/15]
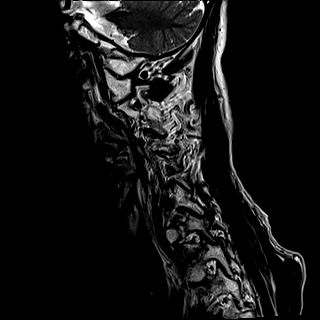

[Series 7: STIR · sagittal · 3.0mm · 0.62mm/px · 4 of 15 slices shown]
[im 1/15]
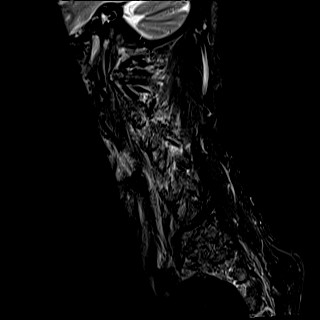
[im 5/15]
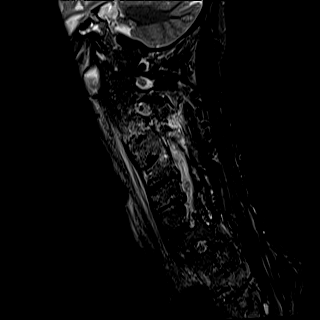
[im 10/15]
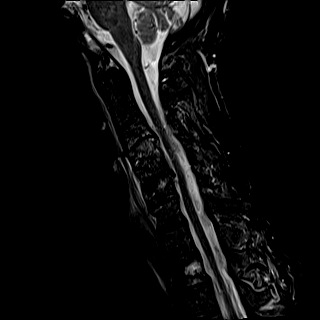
[im 15/15]
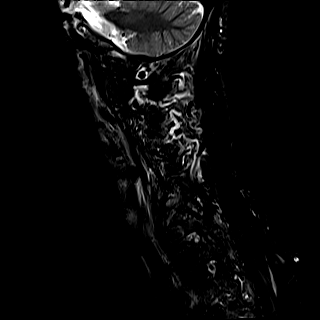

[Series 8: T2 · axial · 3.0mm · 0.70mm/px · z∈[-138,-21]mm · 8 of 35 slices shown (2 of 2)]
[im 1/35]
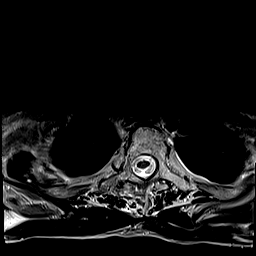
[im 5/35]
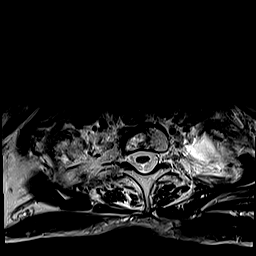
[im 10/35]
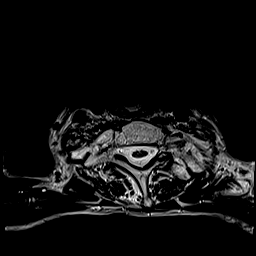
[im 15/35]
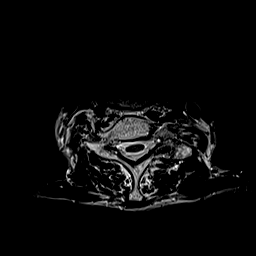
[im 20/35]
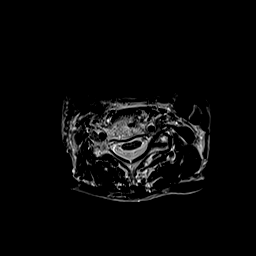
[im 25/35]
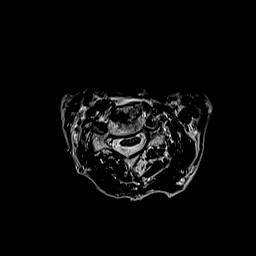
[im 30/35]
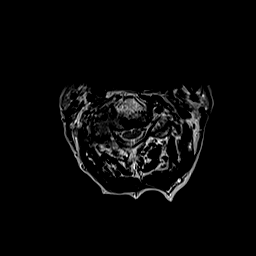
[im 35/35]
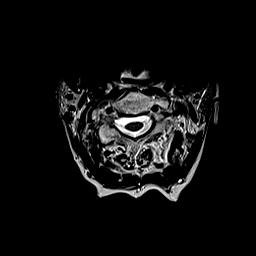

[Series 10: T1 · axial · non-contrast · 3.0mm · 0.35mm/px · z∈[-138,-21]mm · 8 of 35 slices shown]
[im 1/35]
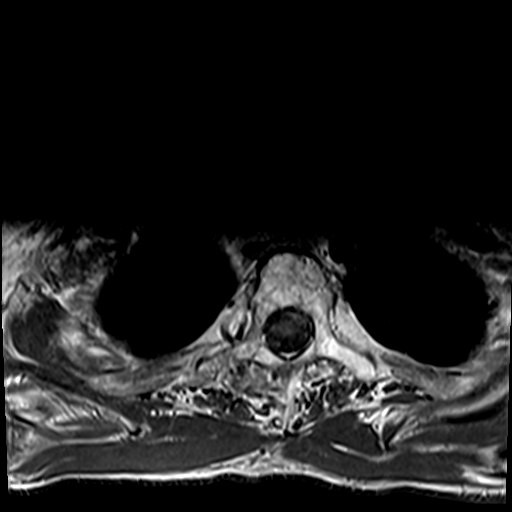
[im 5/35]
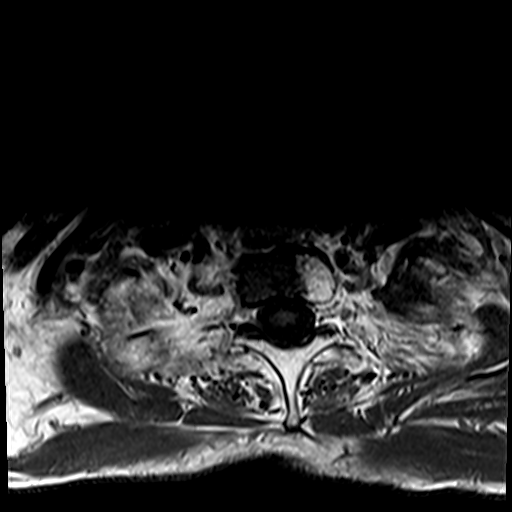
[im 10/35]
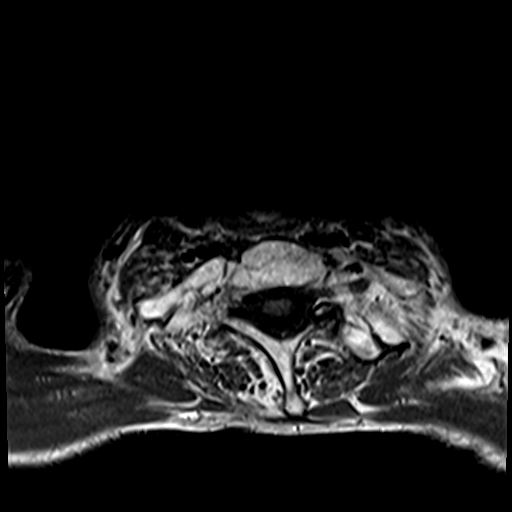
[im 15/35]
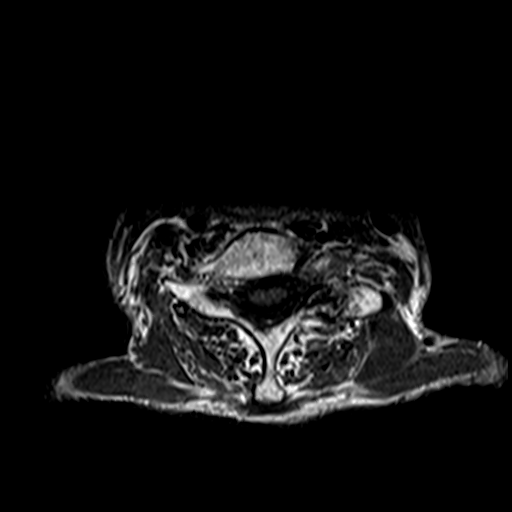
[im 20/35]
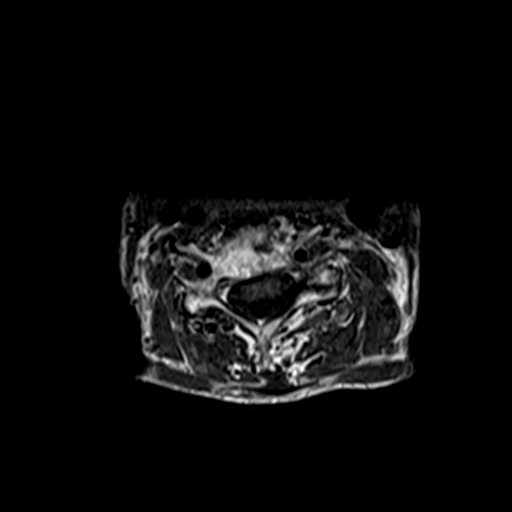
[im 25/35]
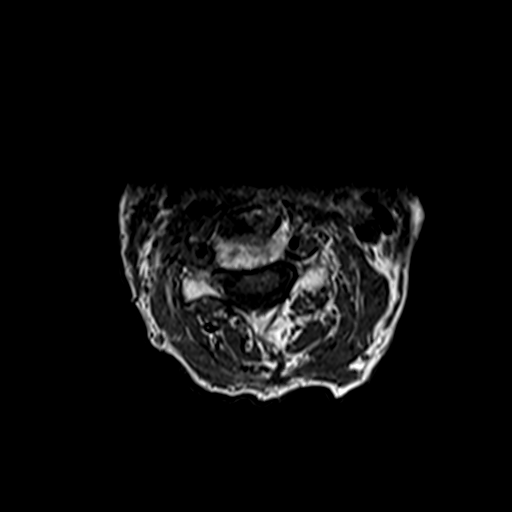
[im 30/35]
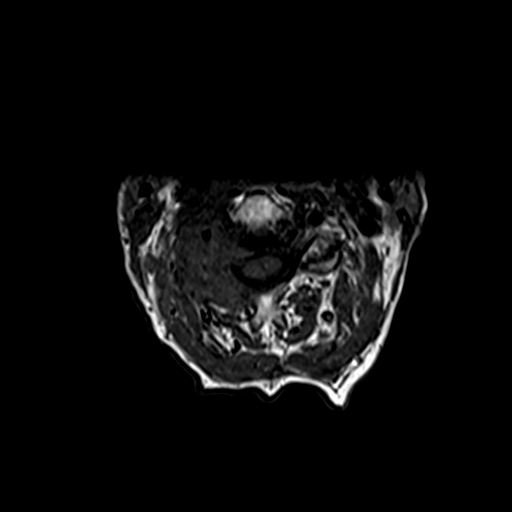
[im 35/35]
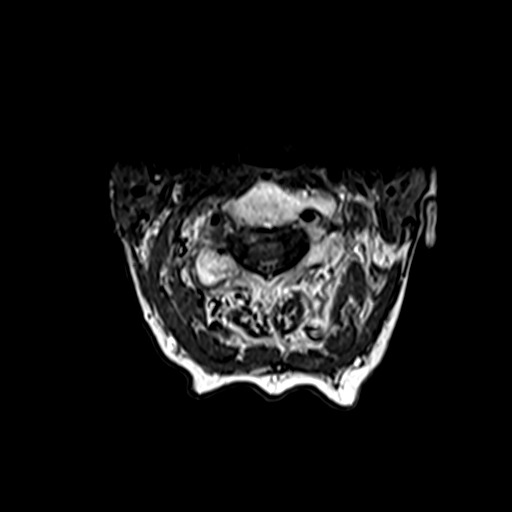

[Series 12: T1 post-contrast · axial · 3.0mm · 0.35mm/px · z∈[-138,-90]mm · 4 of 35 slices shown]
[im 1/35]
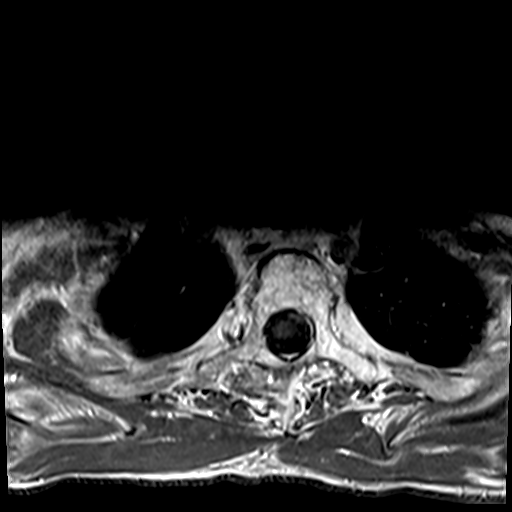
[im 5/35]
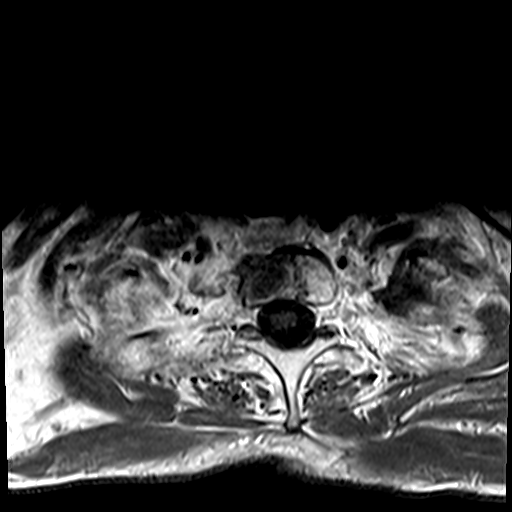
[im 10/35]
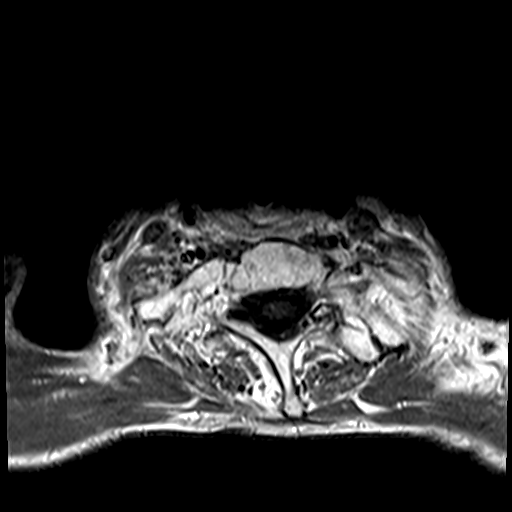
[im 15/35]
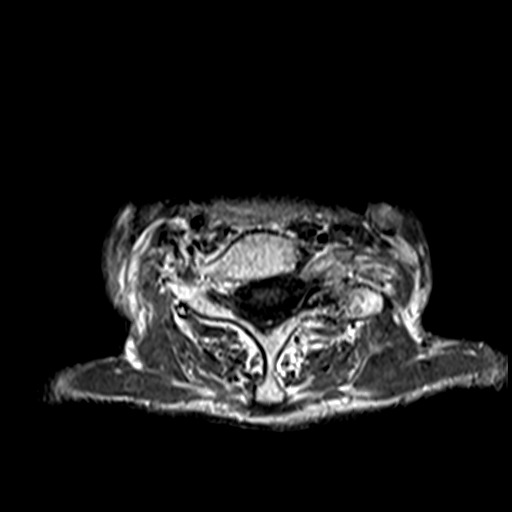

[28 of 48 positions shown; findings below may reference images not displayed]

FINDINGS: Alignment: Minor degenerative listhesis.

Vertebrae: Abnormal marrow signal and enhancement at C4 with
involvement of the right posterior elements. Abnormal marrow signal
at the inferior C3 endp[REDACTED] reflect additional disease or may be
on a degenerative basis. Abnormal signal at the right aspect of the
T2 vertebral body with pathologic fracture. Multilevel degenerative
endplate irregularity.

Cord: No abnormal signal.  No abnormal intrathecal enhancement.

Posterior Fossa, vertebral arteries, paraspinal tissues: Right
vertebral artery is surrounded by abnormal soft tissue at the C4
level but the flow void remains preserved. Mild prevertebral edema.
Right retropharyngeal cystic/necrotic lesion measures similar to
prior CT (series 5, image 4). Left cerebellar developmental venous
anomaly.

Disc levels:

C2-C3:  Fusion of facets.  No significant stenosis.

C3-C4: Disc bulge with endplate osteophytes. Uncovertebral and facet
hypertrophy. Minor right ventral, dorsal, and lateral epidural
extension of above described disease without canal stenosis. Above
described disease involves the right neural foramen. Mild left
foraminal degenerative stenosis.

C4-C5: Disc bulge with endplate osteophytes. Uncovertebral and facet
hypertrophy. Above described disease encroaches on the right neural
foramen. There is no significant canal or left foraminal stenosis.

C5-C6: Disc bulge with endplate osteophytes. Uncovertebral and facet
hypertrophy. Effacement of the ventral subarachnoid space without
significant canal stenosis. Mild right foraminal stenosis. No left
foraminal stenosis.

C6-C7: Disc bulge with endplate osteophytes. Uncovertebral and facet
hypertrophy. Partial effacement of the ventral subarachnoid space
without significant canal stenosis. Minor right foraminal stenosis.
No left foraminal stenosis.

C7-T1: Disc bulge with endplate osteophytes. Facet hypertrophy. No
canal or right foraminal stenosis. Minor left foraminal stenosis.
IMPRESSION: Destructive metastatic lesion at C4 involving the right posterior
elements. There is mild epidural disease without canal stenosis.
Involvement of right C3-C4 and C4-C5 foramina.

Abnormal signal at the inferior C3 endplate is probably on a
degenerative basis, less likely additional metastasis.

Destructive metastatic lesion at T2 with pathologic fracture.

Probably no substantial progression from [DATE] cervical
spine CT.

## 2021-03-06 MED ORDER — GADOBUTROL 1 MMOL/ML IV SOLN
4.0000 mL | Freq: Once | INTRAVENOUS | Status: AC | PRN
  Administered 2021-03-06: 4 mL via INTRAVENOUS

## 2021-03-20 ENCOUNTER — Ambulatory Visit: Payer: Medicare Other

## 2021-03-20 ENCOUNTER — Ambulatory Visit
Admission: RE | Admit: 2021-03-20 | Discharge: 2021-03-20 | Disposition: A | Discharge status: Home / Self Care | Payer: Medicare Other | Source: Ambulatory Visit | Attending: Oncology | Admitting: Oncology

## 2021-03-20 ENCOUNTER — Other Ambulatory Visit: Payer: Medicare Other

## 2021-03-20 ENCOUNTER — Inpatient Hospital Stay: Payer: Medicare Other

## 2021-03-20 ENCOUNTER — Other Ambulatory Visit: Payer: Self-pay

## 2021-03-20 DIAGNOSIS — C329 Malignant neoplasm of larynx, unspecified: Secondary | ICD-10-CM | POA: Diagnosis present

## 2021-03-20 DIAGNOSIS — C3492 Malignant neoplasm of unspecified part of left bronchus or lung: Secondary | ICD-10-CM | POA: Insufficient documentation

## 2021-03-20 IMAGING — CT CT NECK W/ CM
4 of 5 series · 13 of 33 positions shown, 15 images · IV contrast (agent unspecified)
Comparison: CT cervical spine [DATE]. MRI cervical spine
[DATE]

CLINICAL DATA: [place] pharyngeal cancer. Assess response to treatment.
Small cell lung cancer. Postop chemo and radiation to neck.

EXAM:
CT NECK WITH CONTRAST
TECHNIQUE: Multidetector CT imaging of the neck was performed using the
standard protocol following the bolus administration of intravenous
contrast.

[Series 4: bone windows neck 2.00 · axial · 0.51mm/px · z∈[-750,-614]mm · 3 of 137 slices shown, 4 images]
[im 35/137  soft-tissue]
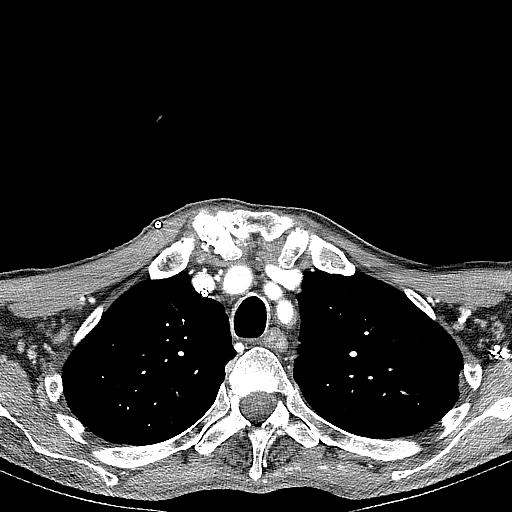
[im 35/137  bone]
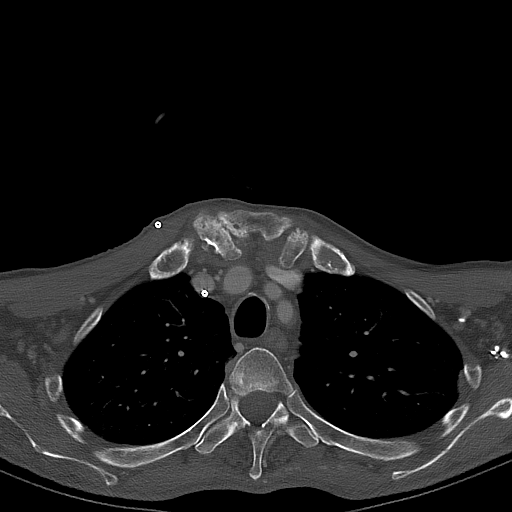
[im 69/137  bone]
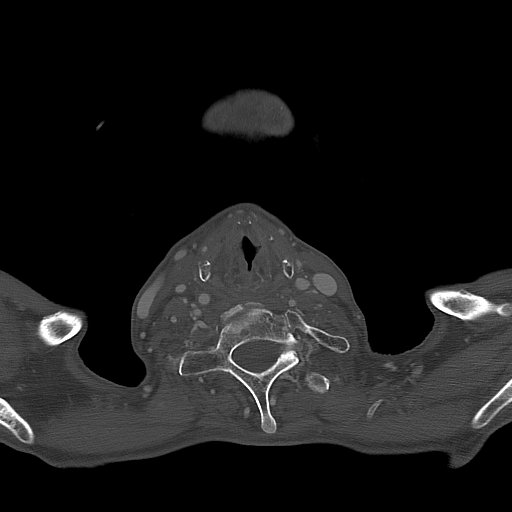
[im 103/137  bone]
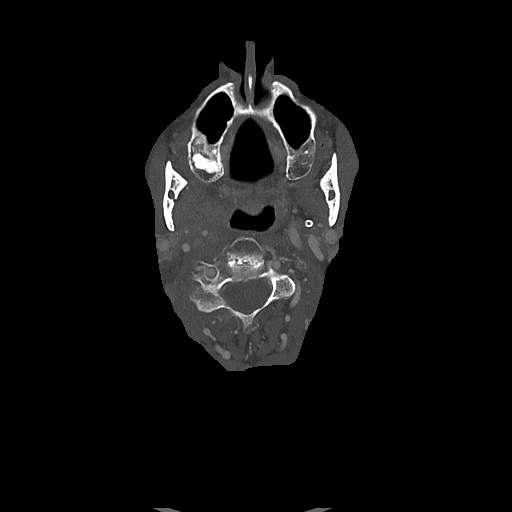

[Series 5: coronals neck 2.00 cor · coronal · 0.51mm/px · 3 of 113 slices shown]
[im 23/113  bone]
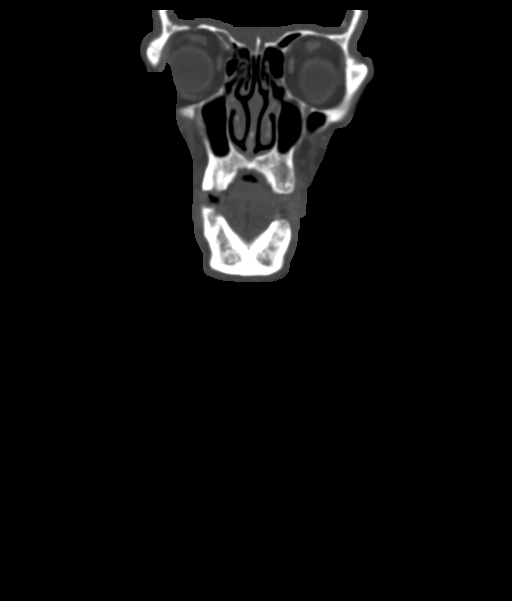
[im 45/113  bone]
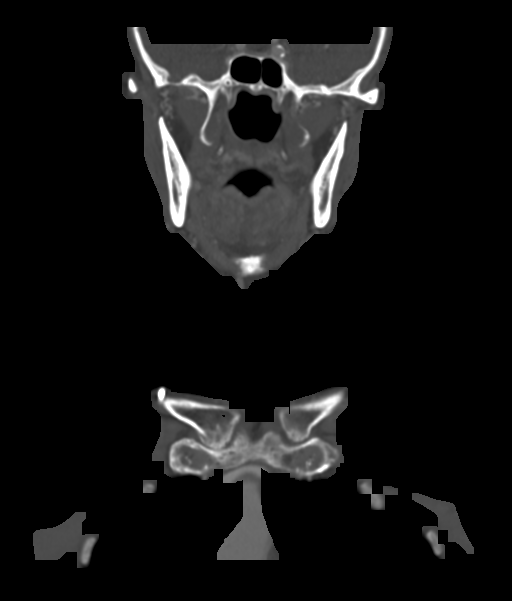
[im 68/113  bone]
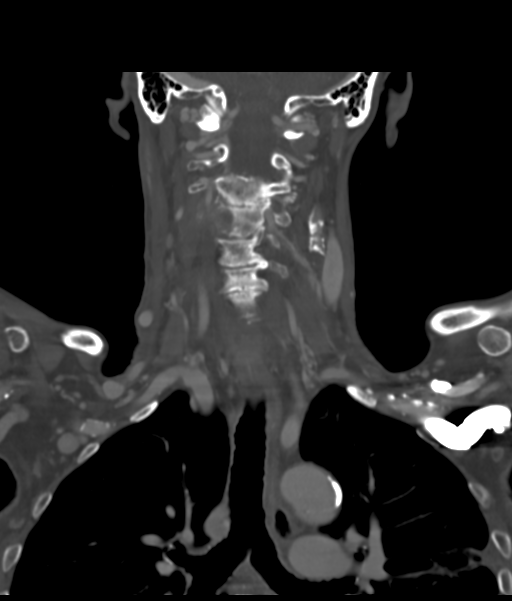

[Series 7: sagittals neck 2.00 sag · sagittal · 0.44mm/px · 5 of 130 slices shown, 6 images]
[im 44/130  bone]
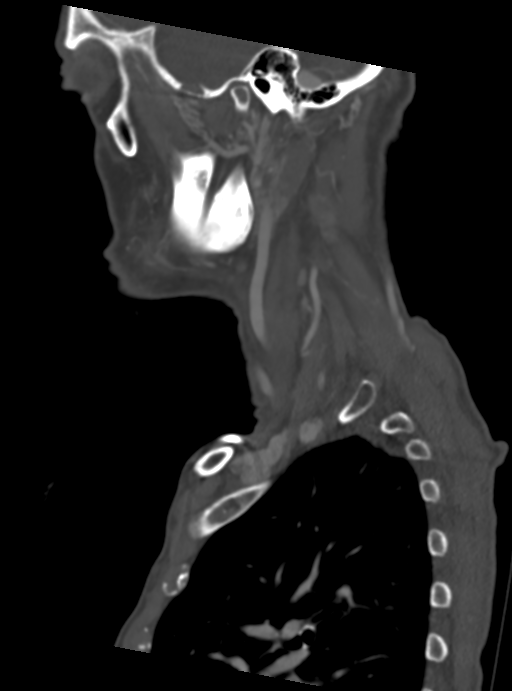
[im 54/130  bone]
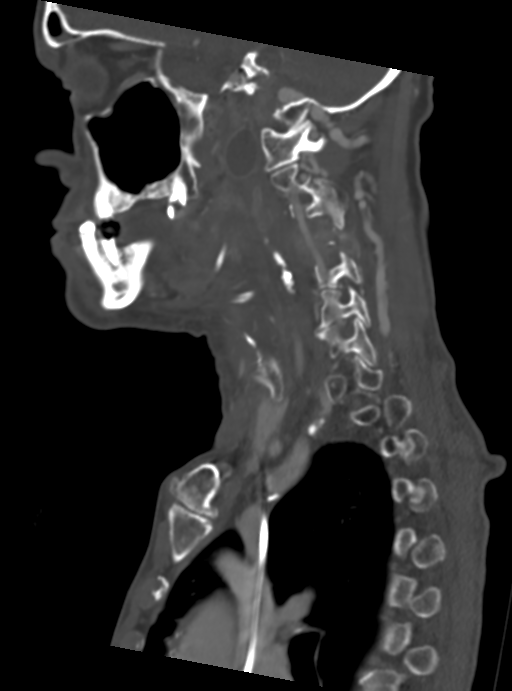
[im 65/130  soft-tissue]
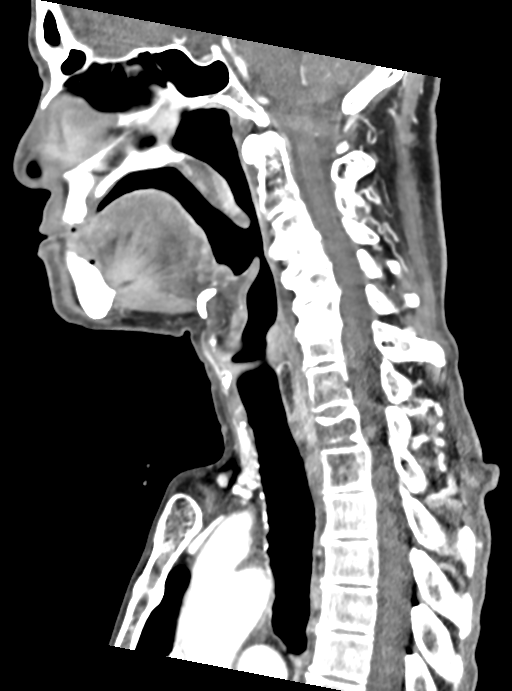
[im 65/130  bone]
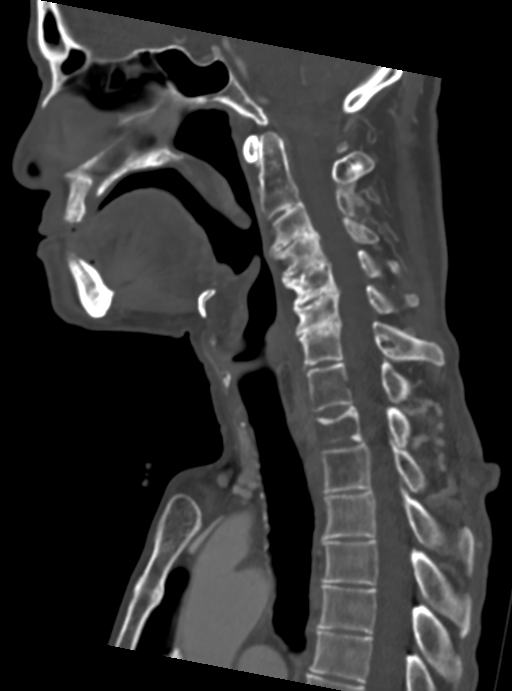
[im 76/130  bone]
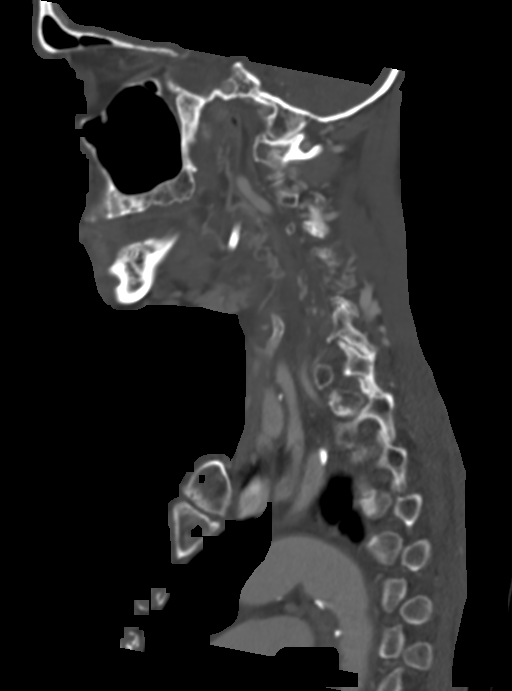
[im 87/130  bone]
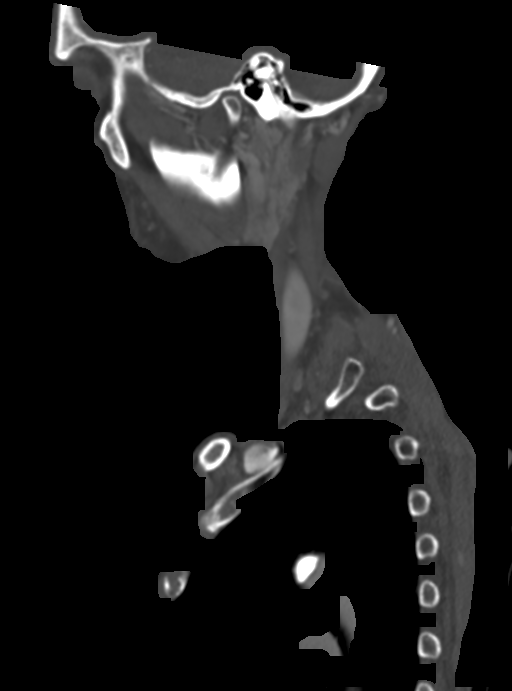

[Series 9: ax oropharynx neck 2.00 ax · axial · 0.44mm/px · z∈[-774,-700]mm · 2 of 152 slices shown]
[im 38/152  bone]
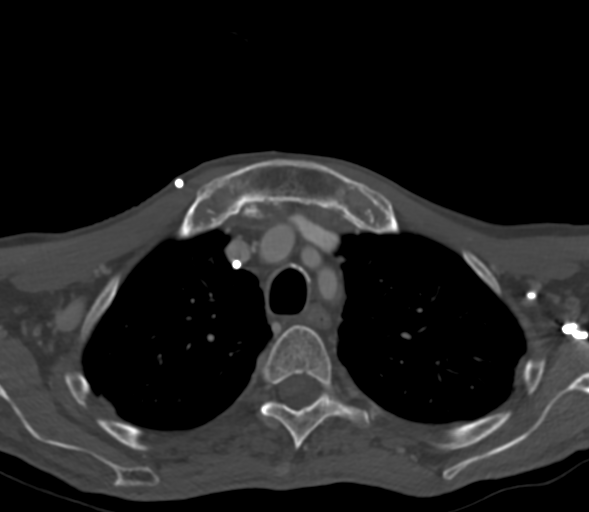
[im 76/152  bone]
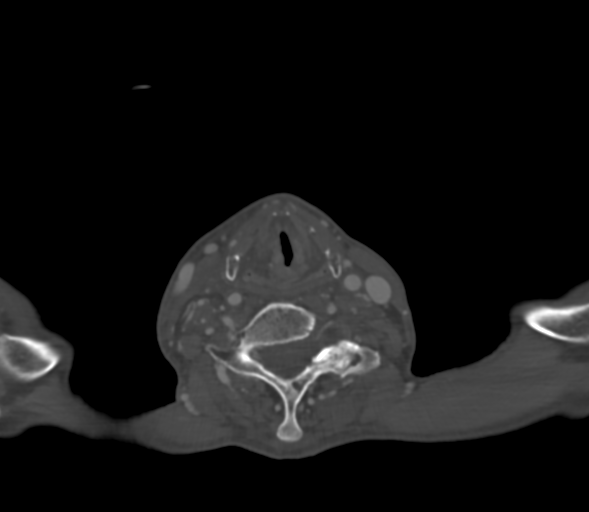

[13 of 33 positions shown; findings below may reference images not displayed]

RADIATION DOSE REDUCTION: This exam was performed according to the
departmental dose-optimization program which includes automated
exposure control, adjustment of the mA and/or kV according to
patient size and/or use of iterative reconstruction technique.

CONTRAST:  100mL OMNIPAQUE IOHEXOL 300 MG/ML  SOLN
FINDINGS: Pharynx and larynx: Post radiation changes in the larynx and
epiglottis with edema. There is retropharyngeal edema similar to the
prior study.

Salivary glands: Post radiation change with hyperenhancement of the
parotid and submandibular gland bilaterally. No mass lesion.

Thyroid: Negative

Lymph nodes: Retropharyngeal lymph node on the right is slightly
smaller measuring 15 x 20 mm with central necrosis. Treated lymph
node mass around the right carotid artery on the right appears
stable. 8 mm lymph node posterior to the sternocleidomastoid muscle
in the upper neck is stable. See axial image 35.

No pathologic lymph nodes in the left neck.

Vascular: Severe atherosclerotic stenosis right carotid bifurcation.
Right internal carotid artery is small but patent. Moderate
atherosclerotic stenosis left internal carotid artery. Chronic
occlusion right jugular vein. Left jugular vein patent.

Limited intracranial: No enhancing lesion in the brain.

Visualized orbits: Negative

Mastoids and visualized paranasal sinuses: Minimal mucosal edema
paranasal sinuses. Mastoid clear bilaterally.

Skeleton: Destructive mass lesion in the right skull base lateral to
the clivus has developed since the prior CT. In retrospect this is
seen on the prior MRI. No significant intracranial extension of
tumor.

Destructive mass in the right lateral C3 vertebral body appears
progressive. There is destruction of the right C3 pedicle and
progressive destruction of the lamina on the right. There is tumor
filling the C3-4 foramen on the right. Progressive epidural tumor in
the canal on the right without cord compression.

Destructive mass lesion on the right at T2 is unchanged and appears
to be a treated metastatic deposit. Associated levoscoliosis
unchanged.

Upper chest: Chest CT from today reported separately

Other: None
IMPRESSION: 1. Progressive metastatic disease in the right skull base involving
the right occipital bone. No intracranial extension.
2. Progression of metastatic disease to the right lateral mass of C3
with progressive bone destruction and progressive epidural tumor in
the canal. No spinal stenosis. There is tumor filling the right C3-4
foramen.
3. Stable treated metastatic disease right T2 vertebral body
4. Necrotic retropharyngeal node on the right slightly smaller.
Stable lymph nodes in the right neck.
5. Post radiation changes in the neck as above.
6. Severe stenosis right internal carotid artery due to
atherosclerotic calcification with string sign. Moderate stenosis
left internal carotid artery.

## 2021-03-20 IMAGING — CT CT CHEST-ABD-PELV W/ CM
2 of 5 series · 12 of 36 positions shown, 14 images · IV contrast (agent unspecified)
Comparison: [DATE]

CLINICAL DATA: Left upper lobe squamous cell carcinoma, metastatic
oropharyngeal carcinoma restaging

EXAM:
CT CHEST, ABDOMEN, AND PELVIS WITH CONTRAST
TECHNIQUE: Multidetector CT imaging of the chest, abdomen and pelvis was
performed following the standard protocol during bolus
administration of intravenous contrast.

[Series 3: cap with 5.00 ax · axial · 0.67mm/px · z∈[-1237,-742]mm · 9 of 125 slices shown, 11 images]
[im 13/125  mediastinal]
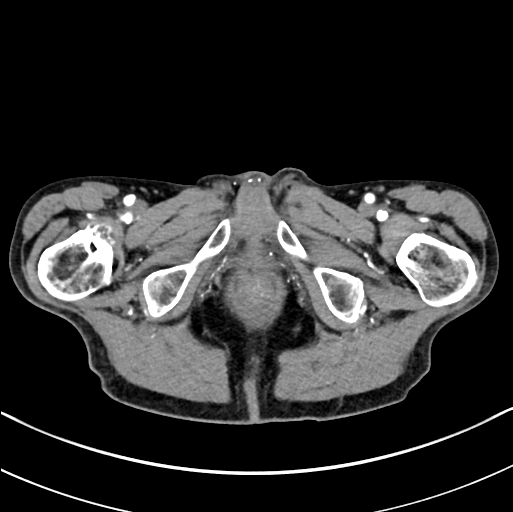
[im 13/125  bone]
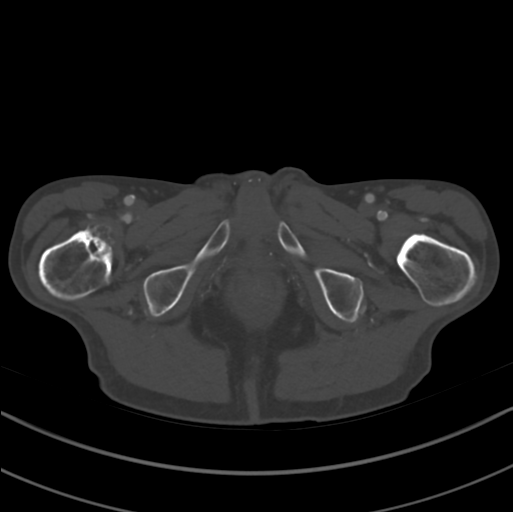
[im 25/125  mediastinal]
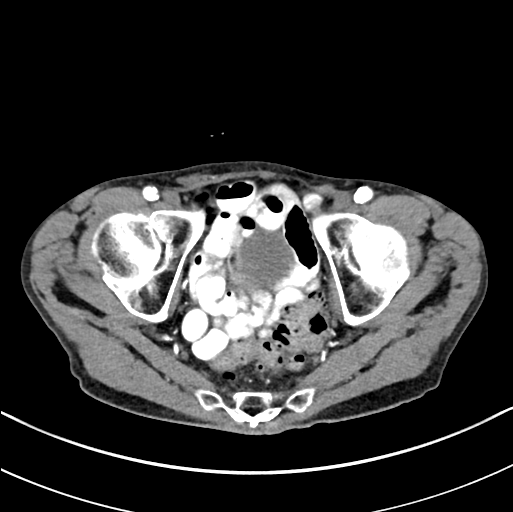
[im 38/125  mediastinal]
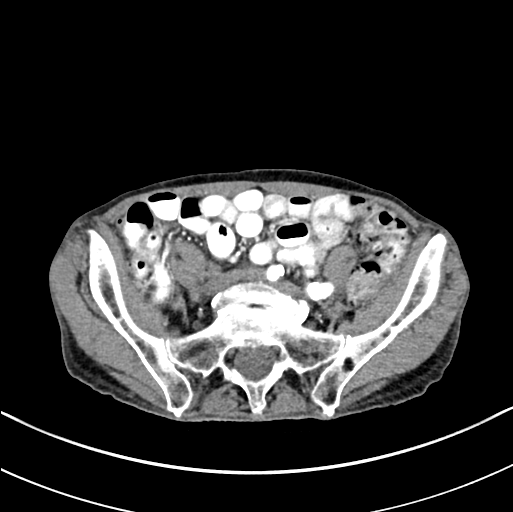
[im 50/125  mediastinal]
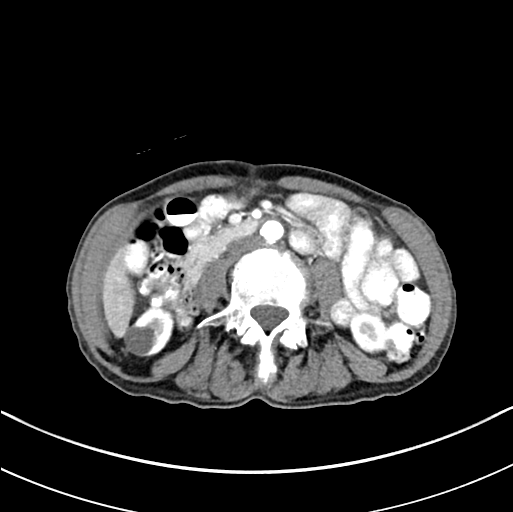
[im 63/125  mediastinal]
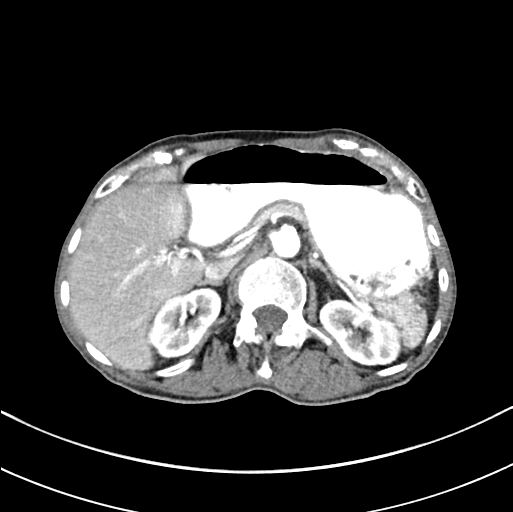
[im 75/125  mediastinal]
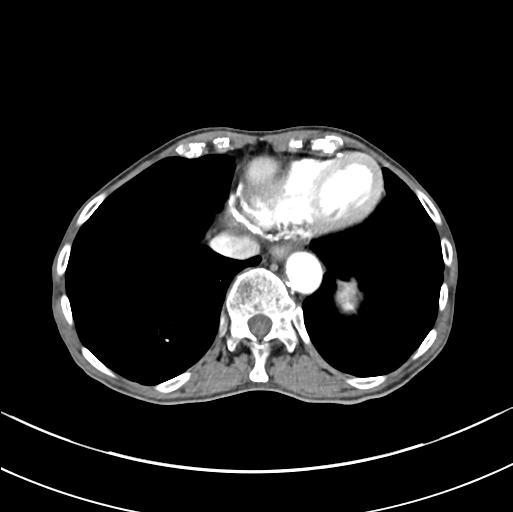
[im 87/125  mediastinal]
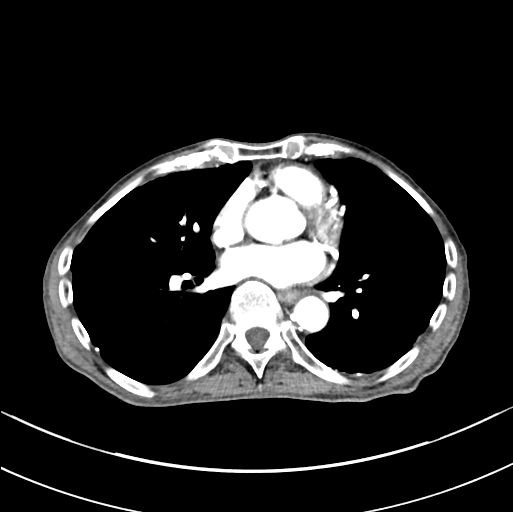
[im 100/125  mediastinal]
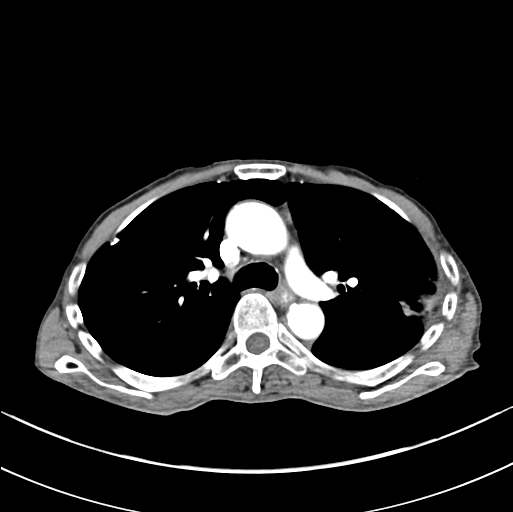
[im 112/125  mediastinal]
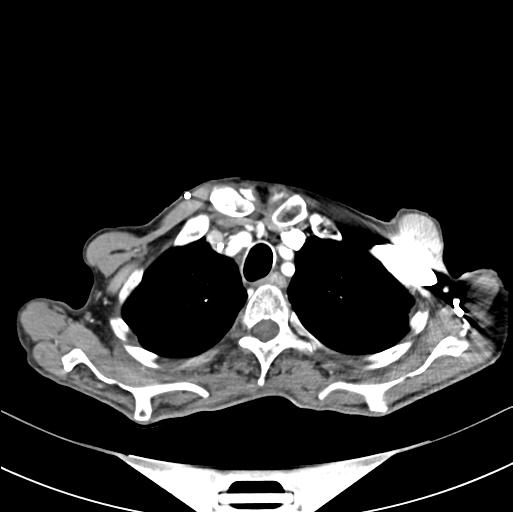
[im 112/125  bone]
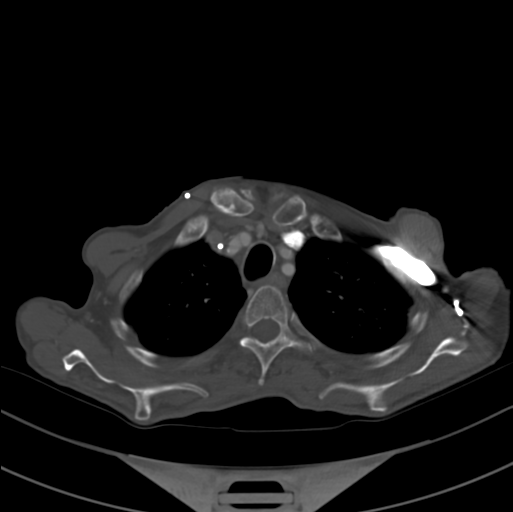

[Series 7: coronal cap with 2.00 cor · coronal · 0.67mm/px · 3 of 112 slices shown]
[im 23/112  mediastinal]
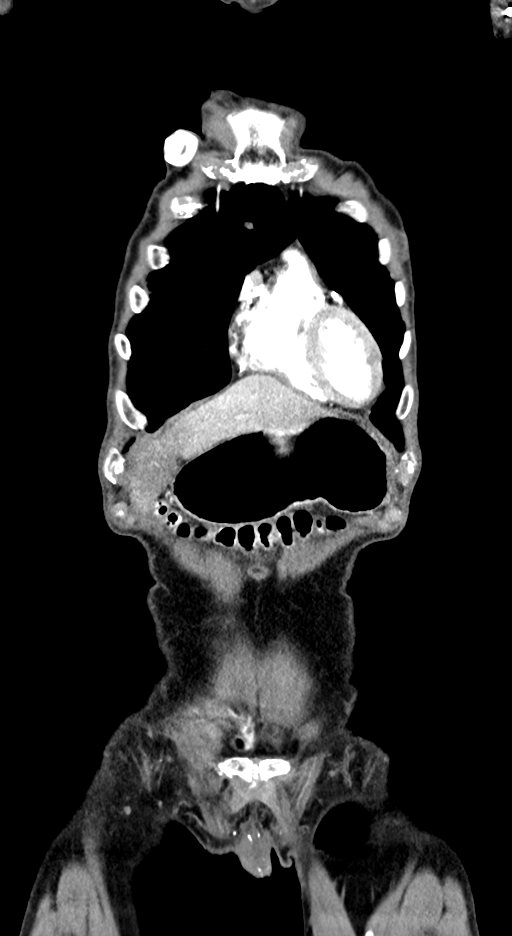
[im 45/112  mediastinal]
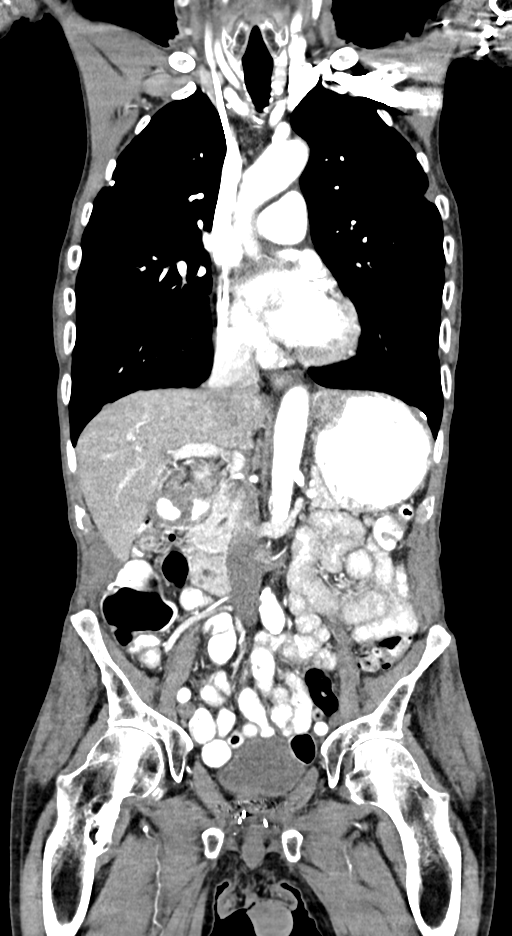
[im 67/112  mediastinal]
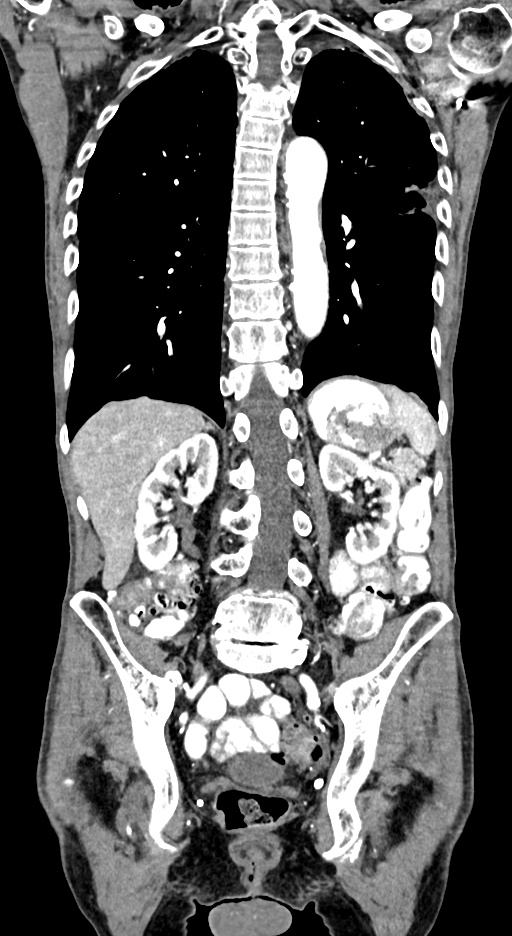

[12 of 36 positions shown; findings below may reference images not displayed]

RADIATION DOSE REDUCTION: This exam was performed according to the
departmental dose-optimization program which includes automated
exposure control, adjustment of the mA and/or kV according to
patient size and/or use of iterative reconstruction technique.

CONTRAST:  100mL OMNIPAQUE IOHEXOL 300 MG/ML SOLN additional oral
enteric contrast
FINDINGS: CT CHEST FINDINGS

Cardiovascular: Right chest port catheter. Aortic atherosclerosis.
Normal heart size. Extensive three-vessel coronary artery
calcifications. No pericardial effusion.

Mediastinum/Nodes: No enlarged mediastinal, hilar, or axillary lymph
nodes. Thyroid gland, trachea, and esophagus demonstrate no
significant findings.

Lungs/Pleura: Continued slight interval decrease in size of a
subpleural nodule of the peripheral posterior left upper lobe,
measuring 2.2 x 2.2 cm, previously 2.7 x 2.3 cm, with adjacent
ground-glass and retraction (series 5, image 65). Interval increase
in size of a cystic lesion of the paramedian left upper lobe,
measuring 1.6 x 1.4 cm with new wall thickening, previously 1.1 x
1.1 cm (series 5, image 26). Interval increase in size of a multiple
additional subpleural cystic lesions of the anterior left upper
lobe, measuring 1.5 x 1.1 cm, previously 1.1 x 0.7 cm, however this
remains thin walled (series 5, image 65), as well as similar lesions
of the bilateral lower lobes (series 5, image 115). Interval
increase in size of a subpleural nodule of the medial anterior right
upper lobe, measuring 1.2 x 0.8 cm, previously 0.7 x 0.5 cm (series
5, image 76). There is additionally new irregular ground-glass and
consolidation in the dependent right upper lobe (series 5, image
50), largest nodular component in this vicinity measuring 1.1 x
cm (series 5, image 57). New scattered centrilobular nodularity and
ground-glass in the lateral segment right middle lobe (series 5,
image 102), with a discrete nodule in this vicinity measuring 0.5 x
0.4 cm (series 5, image 115). Mild predominantly paraseptal
emphysema. No pleural effusion or pneumothorax.

Musculoskeletal: No chest wall mass or suspicious osseous lesions
identified.

CT ABDOMEN PELVIS FINDINGS

Hepatobiliary: No solid liver abnormality is seen. No gallstones,
gallbladder wall thickening, or biliary dilatation.

Pancreas: Unremarkable. No pancreatic ductal dilatation or
surrounding inflammatory changes.

Spleen: Normal in size without significant abnormality.

Adrenals/Urinary Tract: Adrenal glands are unremarkable. Kidneys are
normal, without renal calculi, solid lesion, or hydronephrosis.
Bladder is unremarkable.

Stomach/Bowel: Stomach is within normal limits. Appendix appears
normal. No evidence of bowel wall thickening, distention, or
inflammatory changes. Pancolonic diverticulosis.

Vascular/Lymphatic: No significant vascular findings are present. No
enlarged abdominal or pelvic lymph nodes.

Reproductive: Prostate brachytherapy.

Other: No abdominal wall hernia or abnormality. No ascites.

Musculoskeletal: No acute osseous findings.
IMPRESSION: 1. Continued slight interval decrease in size of a subpleural nodule
of the peripheral posterior left upper lobe, with adjacent
ground-glass and retraction, consistent with evolving radiation
pneumonitis and fibrosis about a treated nodule.
2. There are however multiple bilateral cystic pulmonary lesions,
which are increased in size, at least one of which with significant
wall thickening, although unusual in appearance highly concerning
for squamous cell metastases given enlargement and wall thickening.
3. Interval enlargement of a solid pulmonary nodule of the right
upper lobe, consistent with an enlarging metastasis.
4. There is additionally new irregular ground-glass and
consolidation in the dependent right upper lobe, as well as in the
lateral segment right middle lobe, configuration and distribution of
these findings most suggestive of nonspecific infection or
aspiration, however an additional manifestation of malignancy is
difficult to exclude. Attention on follow-up.
5. Emphysema.
6. Coronary artery disease.

Aortic Atherosclerosis ([UR]-[UR]).

## 2021-03-20 MED ORDER — IOHEXOL 300 MG/ML  SOLN
100.0000 mL | Freq: Once | INTRAMUSCULAR | Status: AC | PRN
  Administered 2021-03-20: 14:00:00 100 mL via INTRAVENOUS

## 2021-03-23 ENCOUNTER — Inpatient Hospital Stay (HOSPITAL_BASED_OUTPATIENT_CLINIC_OR_DEPARTMENT_OTHER): Payer: Medicare Other | Admitting: Oncology

## 2021-03-23 ENCOUNTER — Inpatient Hospital Stay: Payer: Medicare Other

## 2021-03-23 ENCOUNTER — Other Ambulatory Visit: Payer: Self-pay

## 2021-03-23 ENCOUNTER — Encounter: Payer: Self-pay | Admitting: Oncology

## 2021-03-23 VITALS — BP 152/82 | HR 66 | Temp 96.7°F | Resp 16 | Wt 102.0 lb

## 2021-03-23 DIAGNOSIS — C329 Malignant neoplasm of larynx, unspecified: Secondary | ICD-10-CM

## 2021-03-23 DIAGNOSIS — Z95828 Presence of other vascular implants and grafts: Secondary | ICD-10-CM

## 2021-03-23 DIAGNOSIS — Z5112 Encounter for antineoplastic immunotherapy: Secondary | ICD-10-CM | POA: Diagnosis not present

## 2021-03-23 DIAGNOSIS — C109 Malignant neoplasm of oropharynx, unspecified: Secondary | ICD-10-CM | POA: Diagnosis not present

## 2021-03-23 DIAGNOSIS — C3492 Malignant neoplasm of unspecified part of left bronchus or lung: Secondary | ICD-10-CM | POA: Diagnosis not present

## 2021-03-23 DIAGNOSIS — F1027 Alcohol dependence with alcohol-induced persisting dementia: Secondary | ICD-10-CM | POA: Diagnosis not present

## 2021-03-23 LAB — CBC WITH DIFFERENTIAL/PLATELET
Abs Immature Granulocytes: 0.03 10*3/uL (ref 0.00–0.07)
Basophils Absolute: 0 10*3/uL (ref 0.0–0.1)
Basophils Relative: 1 %
Eosinophils Absolute: 0.2 10*3/uL (ref 0.0–0.5)
Eosinophils Relative: 3 %
HCT: 32.3 % — ABNORMAL LOW (ref 39.0–52.0)
Hemoglobin: 10.1 g/dL — ABNORMAL LOW (ref 13.0–17.0)
Immature Granulocytes: 1 %
Lymphocytes Relative: 11 %
Lymphs Abs: 0.7 10*3/uL (ref 0.7–4.0)
MCH: 28.9 pg (ref 26.0–34.0)
MCHC: 31.3 g/dL (ref 30.0–36.0)
MCV: 92.3 fL (ref 80.0–100.0)
Monocytes Absolute: 0.7 10*3/uL (ref 0.1–1.0)
Monocytes Relative: 11 %
Neutro Abs: 4.5 10*3/uL (ref 1.7–7.7)
Neutrophils Relative %: 73 %
Platelets: 249 10*3/uL (ref 150–400)
RBC: 3.5 MIL/uL — ABNORMAL LOW (ref 4.22–5.81)
RDW: 13.9 % (ref 11.5–15.5)
WBC: 6.1 10*3/uL (ref 4.0–10.5)
nRBC: 0 % (ref 0.0–0.2)

## 2021-03-23 LAB — COMPREHENSIVE METABOLIC PANEL
ALT: 16 U/L (ref 0–44)
AST: 21 U/L (ref 15–41)
Albumin: 3.4 g/dL — ABNORMAL LOW (ref 3.5–5.0)
Alkaline Phosphatase: 74 U/L (ref 38–126)
Anion gap: 8 (ref 5–15)
BUN: 15 mg/dL (ref 8–23)
CO2: 27 mmol/L (ref 22–32)
Calcium: 8.9 mg/dL (ref 8.9–10.3)
Chloride: 102 mmol/L (ref 98–111)
Creatinine, Ser: 0.76 mg/dL (ref 0.61–1.24)
GFR, Estimated: >60 mL/min (ref >60)
Glucose, Bld: 117 mg/dL — ABNORMAL HIGH (ref 70–99)
Potassium: 3.5 mmol/L (ref 3.5–5.1)
Sodium: 137 mmol/L (ref 135–145)
Total Bilirubin: 0.8 mg/dL (ref 0.3–1.2)
Total Protein: 6.9 g/dL (ref 6.5–8.1)

## 2021-03-23 LAB — TSH: TSH: 3.504 u[IU]/mL (ref 0.350–4.500)

## 2021-03-23 MED ORDER — SODIUM CHLORIDE 0.9 % IV SOLN
Freq: Once | INTRAVENOUS | Status: AC
  Filled 2021-03-23: qty 250

## 2021-03-23 MED ORDER — SODIUM CHLORIDE 0.9 % IV SOLN
200.0000 mg | Freq: Once | INTRAVENOUS | Status: AC
  Administered 2021-03-23: 200 mg via INTRAVENOUS
  Filled 2021-03-23: qty 8

## 2021-03-23 MED ORDER — HEPARIN SOD (PORK) LOCK FLUSH 100 UNIT/ML IV SOLN
500.0000 [IU] | Freq: Once | INTRAVENOUS | Status: AC | PRN
  Administered 2021-03-23: 500 [IU]
  Filled 2021-03-23: qty 5

## 2021-03-23 MED ORDER — SODIUM CHLORIDE 0.9% FLUSH
10.0000 mL | Freq: Once | INTRAVENOUS | Status: AC
  Administered 2021-03-23: 10 mL via INTRAVENOUS
  Filled 2021-03-23: qty 10

## 2021-03-23 NOTE — Progress Notes (Signed)
Pt in for follow up, reports has seen multiple doctors in the past few days.  Pt had some soreness in chest wall, right side from port placement.

## 2021-03-23 NOTE — Patient Instructions (Signed)
[facility] [place]  Discharge Instructions: ?Thank you for choosing [facility] to provide your oncology and hematology care.  ?If you have a lab appointment with the Cancer Center, please go directly to the Cancer Center and check in at the registration area. ? ?Wear comfortable clothing and clothing appropriate for easy access to any Portacath or PICC line.  ? ?We strive to give you quality time with your provider. You may need to reschedule your appointment if you arrive late (15 or more minutes).  Arriving late affects you and other patients whose appointments are after yours.  Also, if you miss three or more appointments without notifying the office, you may be dismissed from the clinic at the provider?s discretion.    ?  ?For prescription refill requests, have your pharmacy contact our office and allow 72 hours for refills to be completed.   ? ?  ?To help prevent nausea and vomiting after your treatment, we encourage you to take your nausea medication as directed. ? ?BELOW ARE SYMPTOMS THAT SHOULD BE REPORTED IMMEDIATELY: ?*FEVER GREATER THAN 100.4 F (38 ?C) OR HIGHER ?*CHILLS OR SWEATING ?*NAUSEA AND VOMITING THAT IS NOT CONTROLLED WITH YOUR NAUSEA MEDICATION ?*UNUSUAL SHORTNESS OF BREATH ?*UNUSUAL BRUISING OR BLEEDING ?*URINARY PROBLEMS (pain or burning when urinating, or frequent urination) ?*BOWEL PROBLEMS (unusual diarrhea, constipation, pain near the anus) ?TENDERNESS IN MOUTH AND THROAT WITH OR WITHOUT PRESENCE OF ULCERS (sore throat, sores in mouth, or a toothache) ?UNUSUAL RASH, SWELLING OR PAIN  ?UNUSUAL VAGINAL DISCHARGE OR ITCHING  ? ?Items with * indicate a potential emergency and should be followed up as soon as possible or go to the Emergency Department if any problems should occur. ? ?Please show the CHEMOTHERAPY ALERT CARD or IMMUNOTHERAPY ALERT CARD at check-in to the Emergency Department and triage nurse. ? ?Should you have questions after your visit  or need to cancel or reschedule your appointment, please contact [facility] [place]  [phone] and follow the prompts.  Office hours are 8:00 a.m. to 4:30 p.m. Monday - Friday. Please note that voicemails left after 4:00 p.m. may not be returned until the following business day.  We are closed weekends and major holidays. You have access to a nurse at all times for urgent questions. Please call the main number to the clinic [phone] and follow the prompts. ? ?For any non-urgent questions, you may also contact your provider using MyChart. We now offer e-Visits for anyone 18 and older to request care online for non-urgent symptoms. For details visit [email or web address]. ?  ?Also download the MyChart app! Go to the app store, search "MyChart", open the app, select [facility], and log in with your MyChart username and password. ? ?Due to Covid, a mask is required upon entering the hospital/clinic. If you do not have a mask, one will be given to you upon arrival. For doctor visits, patients may have 1 support person aged 18 or older with them. For treatment visits, patients cannot have anyone with them due to current Covid guidelines and our immunocompromised population.  ?

## 2021-03-23 NOTE — Progress Notes (Signed)
Hematology/Oncology progress note  [phone] [phone]   Patient Care Team: Casilda Carls, MD as PCP - General (Internal Medicine) Earlie Server, [name], [name]  HISTORY OF PRESENTING ILLNESS:  History of prostate cancer. 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  PD-L1-IHC 40%, no targetable mutations. # 09/07/2019 chemotherapy [cisplatin] and radiation.   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  Interval decrease of the posterior left upper lobe pleural-based nodule with interval decrease in size of the tiny adjacent satellite nodule.  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  No recurrent tumor in right piriform sinus.  Right lateral pharyngeal lymph node is stable.  No new or recurrent adenopathy.Suspect metastasis.  I recommend patient to obtain PET scan restaging.  I discussed about the plan of re-biopsy of either the paraspinal soft tissue mass versus other more feasible hypermetabolic sites detected on PET scan. Patient was seen by Dr. Kennyth Lose and opted to proceed with palliative radiation as soon as possible.  PET scan cannot be arranged prior to the radiation so the PET scan was canceled. 04/20/2020 -04/25/2020, palliative radiation to thoracic spine  05/10/2020, MRI thoracic spine was obtained for  worsening of back pain and left shoulder pain.  Images showed metastatic disease throughout almost the entire T2 with and associated mild superior endplate compression fracture.  Abnormal signal in the anterior, inferior endplate of T1, superior aspect of the T3.  Small metastatic deposit in T12 is also noted. Case was also discussed on multidisciplinary tumor board on 05/25/2020 05/22/2020, PET scan showed new hypermetabolic lytic bone metastasis in the left scapular near the glenoid and in the T2 vertebral body.  Persistent hypermetabolic right lateral retropharyngeal and right level 3 neck lymph node metastasis.  Peripheral left upper lobe pulmonary nodule is mildly decreased with new surrounding mild platelike postradiation changes..  No residual recurrent neoplasm at the site of vomiting in the right piriform sinus  06/07/2020 -06/09/2020 patient underwent palliative radiation to left scapular April 2022 seen by [name] and flexible laryngoscope examination showed new lesion at the right false vocal cord.  Case was discussed on tumor board on 06/22/2020. [name] recommend additional CT soft tissue neck with contrast for additional evaluation. 06/29/2020 CT images were reviewed and discussed with patient. There is new malignant lymph node in the right mid neck at the level of the hyoid, this compressing the right jugular vein with thrombus in the vein above and below the lymph node.  Cystic retropharyngeal lymph node on the right is stable.  Progressive soft tissue thickening in the larynx bilaterally suspicious for tumor.  Metastatic disease at the T2 is unchanged.  Metastatic deposit left scapula there is now a pathological fracture of the scapular. CT scan findings were discussed with ENT [name] via secure chat.  [name] feels surgery is not feasible.  The larynx lesion currently does not compromise  his airway however further progression may in the future  07/10/2020 Seen by Radonc  [name] who would offer salvage radiation to his larynx as well as neck adenopathy. Would like low dose chemotherapy as sensitizer.    Patient was also referred to establish care with Dr. Georgiann Cocker at [facility] for kyphoplasty.  Patient's wife reports that they were never contacted and she called Dr. Georgiann Cocker office with no success.  # NGS.  PD-L1-IHC 40%, no targetable mutations # 07/25/20- 07/27/2020, 08/01/20- 09/06/2020  concurrent chemoradiation [low-dose weekly cisplatin 84m/m2 ] to the larynx  09/15/2020, patient was seen by ENT Dr. MTami Ribas  Laryngoscope examination reviewed clear larynx, no lesion was discovered. 10/04/2020, started on maintenance Keytruda. 12/11/2020 restaging CT 1. Progression of Disease: - new since May right C3 destructive osseous metastasis has eroded the right pedicle and facet, with tumor filling the right C4 neural foramen and engulfing the right vertebral artery which remains patent. Mild right lateral epidural tumor. - small (8 mm short axis) but progressed right level 2A nodal disease with evidence of extracapsular extension. 2. 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  3. No recurrent laryngeal or pharyngeal tumor identified. 4. 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 CT Chest, Abdomen, and Pelvis the same day are reported separately.  01/16/2021, patient underwent additional palliative radiation to C-spine. 02/05/2021 Patient had a fall and went to emergency room on .   CT cervical spine without contrast showed stable destructive process in the right side of the T3 vertebral body.  Progressive destructive process in the right side and the posterior element of C4.  No fracture.    INTERVAL HISTORY RDeaken Burton a 81y.o. male who has above history reviewed by me today presents for follow-up of head  and neck cancer and lung cancer Patient was accompanied by wife.  He tolerates Keytruda.   Appetite is fair.  Weight is stable. 03/06/2021, MRI cervical spine with and without contrast showed destructive metastatic lesion at C4 involving the right posterior elements.  There is mild epidural disease without canal stenosis.  Involvement of right C3-C4 and C4-C5 foraminal.  Abnormal signal at the inferior C3 endplate is probably on degenerative basis.  Destructive metastatic lesion at T2 with pathological fracture. 03/09/2021 patient was seen by neurosurgeon Dr. YCari Caraway  Based on his current disease burden, dementia, lack of obvious unstable lesion, surgical fixation was not recommended.  Imaging surveillance was recommended and Dr. YCari Carawayis happy to review his imaging over time.  If he has worsening tumor burden, could consider vertebroplasty at T2 or at C4. 03/20/2021, CT chest abdomen pelvis with contrast showed Left upper lobe subpleural nodule[previously treated with radiation] showed interval decrease, with evolving radiation pneumonitis/fibrosis. Multiple bilateral cystic pulmonary lesions, increased in size, at least 1 of which with significant wall thickening.  Interval enlargement of a solid nodule of the right upper lobe, consistent with an enlarging metastasis.  New irregular groundglass and consolidation in the dependent right upper lobe, lateral segment right middle lobe, suggesting nonspecific infection or aspiration.  An additional manifestation of malignancy is difficult to exclude.  Emphysema.  CAD.   Review of Systems  Constitutional:  Positive for fatigue. Negative for appetite change, chills, fever and unexpected weight change.  HENT:   Positive for hearing loss.   Eyes:  Negative for eye problems and icterus.  Respiratory:  Negative for chest tightness,  cough and shortness of breath.   Cardiovascular:  Negative for chest pain and leg swelling.  Gastrointestinal:  Negative for  abdominal distention, abdominal pain, blood in stool and nausea.  Endocrine: Negative for hot flashes.  Genitourinary:  Negative for difficulty urinating, dysuria and frequency.   Musculoskeletal:  Positive for arthralgias.  Skin:  Negative for itching and rash.  Neurological:  Negative for extremity weakness, light-headedness and numbness.  Hematological:  Negative for adenopathy. Does not bruise/bleed easily.  Psychiatric/Behavioral:  Negative for confusion.        Forgetful   MEDICAL HISTORY:  Past Medical History:  Diagnosis Date   Alcohol abuse    Blood transfusion without reported diagnosis 2013   Cataract    Dementia ([place])    Glaucoma    Gout    Hypercholesteremia    Hypertension    Larynx cancer (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   Prostate CA ([place]) 2008   Squamous cell lung cancer ([place]) 06/23/2019    SURGICAL HISTORY: Past Surgical History:  Procedure Laterality Date   BRAIN SURGERY  2012   HERNIA REPAIR     PORTA CATH INSERTION N/A 07/27/2019   Procedure: PORTA CATH INSERTION;  Surgeon: Katha Cabal, MD;  Location: [facility];  Service: Cardiovascular;  Laterality: N/A;    SOCIAL HISTORY: Social History   Socioeconomic History   Marital status: Married    Spouse name: Not on file   Number of children: Not on file   Years of education: Not on file   Highest education level: Not on file  Occupational History   Not on file  Tobacco Use   Smoking status: Former    Years: 21.00    Types: Cigarettes    Quit date: 05/11/2008    Years since quitting: 12.8   Smokeless tobacco: Never  Vaping Use   Vaping Use: Never used  Substance and Sexual Activity   Alcohol use: Yes    Comment: 0.5 pint liquor a week   Drug use: Never   Sexual activity: Not Currently  Other Topics Concern   Not on file  Social History Narrative   Lives at home with wife. Wife states he has some dementia but is able to sign his own consent.   Social  Determinants of Health   Financial Resource Strain: Not on file  Food Insecurity: Not on file  Transportation Needs: Not on file  Physical Activity: Not on file  Stress: Not on file  Social Connections: Not on file  Intimate Partner Violence: Not on file    FAMILY HISTORY: No family history on file.  ALLERGIES:  is allergic to enalapril.  MEDICATIONS:  Current Outpatient Medications  Medication Sig Dispense Refill   atorvastatin (LIPITOR) 20 MG tablet Take 20 mg by mouth daily.     donepezil (ARICEPT) 10 MG tablet Take 10 mg by mouth at bedtime.     dorzolamide-timolol (COSOPT) 22.3-6.8 MG/ML ophthalmic solution Place 1 drop into both eyes 2 (two) times daily.      ELIQUIS 2.5 MG TABS tablet TAKE 1 TABLET BY MOUTH TWICE A DAY 60 tablet 1   folic acid (FOLVITE) 1 MG tablet Take 1 mg by mouth daily.     latanoprost (XALATAN) 0.005 % ophthalmic solution Place 1 drop into both eyes at bedtime.      lidocaine-prilocaine (EMLA) cream Apply 1 application topically as needed. Apply small amount to port site approx 1-2 hours prior to appointment. 30 g  1   MAG64 64 MG TBEC TAKE 1 TABLET (64 MG TOTAL) BY MOUTH 2 (TWO) TIMES DAILY. 180 tablet 0   Multiple Vitamins-Minerals (CENTRUM ADULTS PO) Take by mouth.     oxyCODONE (OXY IR/ROXICODONE) 5 MG immediate release tablet Take 1 tablet (5 mg total) by mouth every 4 (four) hours as needed for severe pain. 90 tablet 0   Thiamine HCl (VITAMIN B-1 PO) Take 100 mg by mouth daily.     colchicine 0.6 MG tablet Take 0.6 mg by mouth daily. SMARTSIG:1 Tablet(s) By Mouth Daily (Patient not taking: Reported on 02/06/2021)     magic mouthwash w/lidocaine SOLN Take 5 mLs by mouth 3 (three) times daily as needed for mouth pain. (Patient not taking: Reported on 02/06/2021) 5 mL 5   nystatin (MYCOSTATIN) 100000 UNIT/ML suspension TAKE 5 MLS (500,000 UNITS TOTAL) BY MOUTH 4 (FOUR) TIMES DAILY. (Patient not taking: Reported on 03/23/2021) 480 mL 1   No current  facility-administered medications for this visit.   Facility-Administered Medications Ordered in Other Visits  Medication Dose Route Frequency Provider Last Rate Last Admin   sodium chloride flush (NS) 0.9 % injection 10 mL  10 mL Intravenous PRN Earlie Server, MD   10 mL at 03/01/20 0812     PHYSICAL EXAMINATION: ECOG PERFORMANCE STATUS: 1 - Symptomatic but completely ambulatory Vitals:   03/23/21 0845  BP: (!) 152/82  Pulse: 66  Resp: 16  Temp: (!) 96.7 F (35.9 C)  SpO2: 96%   Filed Weights   03/23/21 0845  Weight: 102 lb (46.3 kg)    Physical Exam Constitutional:      General: He is not in acute distress. HENT:     Head: Normocephalic and atraumatic.  Eyes:     General: No scleral icterus. Cardiovascular:     Rate and Rhythm: Normal rate and regular rhythm.     Heart sounds: Normal heart sounds.  Pulmonary:     Effort: Pulmonary effort is normal. No respiratory distress.     Breath sounds: No wheezing.  Abdominal:     General: Bowel sounds are normal. There is no distension.     Palpations: Abdomen is soft.  Musculoskeletal:        General: No deformity.     Cervical back: Normal range of motion and neck supple.  Lymphadenopathy:     Cervical: No cervical adenopathy.  Skin:    General: Skin is warm and dry.     Findings: No erythema or rash.  Neurological:     Mental Status: He is alert. Mental status is at baseline.     Cranial Nerves: No cranial nerve deficit.     Coordination: Coordination normal.    LABORATORY DATA:  I have reviewed the data as listed Lab Results  Component Value Date   WBC 6.1 03/23/2021   HGB 10.1 (L) 03/23/2021   HCT 32.3 (L) 03/23/2021   MCV 92.3 03/23/2021   PLT 249 03/23/2021   Recent Labs    01/11/21 0918 01/11/21 1351 02/05/21 1653 03/02/21 0742 03/23/21 0757  NA SPECIMEN CONTAMINATED, UNABLE TO PERFORM TEST(S).   < > 139 137 137  K SPECIMEN CONTAMINATED, UNABLE TO PERFORM TEST(S).   < > 4.4 3.5 3.5  CL SPECIMEN  CONTAMINATED, UNABLE TO PERFORM TEST(S).   < > 103 103 102  CO2 SPECIMEN CONTAMINATED, UNABLE TO PERFORM TEST(S).   < > _0 GLUCOSE SPECIMEN CONTAMINATED, UNABLE TO PERFORM TEST(S).   < > 134* 105* 117*  BUN SPECIMEN CONTAMINATED, UNABLE TO PERFORM TEST(S).   < > _0 CREATININE SPECIMEN CONTAMINATED, UNABLE TO PERFORM TEST(S).   < > 0.91 0.76 0.76  CALCIUM SPECIMEN CONTAMINATED, UNABLE TO PERFORM TEST(S).   < > 9.7 9.1 8.9  GFRNONAA SPECIMEN CONTAMINATED, UNABLE TO PERFORM TEST(S).   < > >60 >60 >60  PROT 3.8*  --   --  6.8 6.9  ALBUMIN 2.0*  --   --  3.8 3.4*  AST 9*  --   --  20 21  ALT 8  --   --  17 16  ALKPHOS 30*  --   --  69 74  BILITOT 0.5  --   --  0.8 0.8  BILIDIR <0.1  --   --   --   --   IBILI NOT CALCULATED  --   --   --   --    < > = values in this interval not displayed.    Iron/TIBC/Ferritin/ %Sat No results found for: IRON, TIBC, FERRITIN, IRONPCTSAT    RADIOGRAPHIC STUDIES: I have personally reviewed the radiological images as listed and agreed with the findings in the report. CT HEAD WO CONTRAST (5MM)  Result Date: 02/05/2021 CLINICAL DATA:  Fall.  Head trauma, moderate-severe EXAM: CT HEAD WITHOUT CONTRAST TECHNIQUE: Contiguous axial images were obtained from the base of the skull through the vertex without intravenous contrast. COMPARISON:  None. FINDINGS: Brain: There is atrophy and chronic small vessel disease changes. No acute intracranial abnormality. Specifically, no hemorrhage, hydrocephalus, mass lesion, acute infarction, or significant intracranial injury. Vascular: No hyperdense vessel or unexpected calcification. Skull: No acute calvarial abnormality. Postoperative changes in the frontal regions bilaterally. Sinuses/Orbits: No acute findings Other: None IMPRESSION: Atrophy, chronic microvascular disease. No acute intracranial abnormality. Electronically Signed   By: Rolm Baptise M.D.   On: 02/05/2021 18:22   CT SOFT TISSUE NECK W  CONTRAST  Result Date: 03/21/2021 CLINICAL DATA:  [place] pharyngeal cancer. Assess response to treatment. 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 EXAM: CT NECK WITH CONTRAST TECHNIQUE: Multidetector CT imaging of the neck was performed using the standard protocol following the bolus administration of intravenous contrast. RADIATION DOSE REDUCTION: This exam was performed according to the departmental dose-optimization program which includes automated exposure control, adjustment of the mA and/or kV according to patient size and/or use of iterative reconstruction technique. CONTRAST:  125m OMNIPAQUE IOHEXOL 300 MG/ML  SOLN COMPARISON:  CT cervical spine 12/12/2020. MRI cervical spine 03/06/2021 FINDINGS: Pharynx and larynx: Post radiation changes in the larynx and epiglottis with edema. There is retropharyngeal edema similar to the prior study. Salivary glands: Post radiation change with hyperenhancement of the parotid and submandibular gland bilaterally. No mass lesion. Thyroid: Negative Lymph nodes: Retropharyngeal lymph node on the right is slightly smaller measuring 15 x 20 mm with central necrosis. Treated lymph node mass around the right carotid artery on the right appears stable. 8 mm lymph node posterior to the sternocleidomastoid muscle in the upper neck is stable. See axial image 35. No pathologic lymph nodes in the left neck. Vascular: Severe atherosclerotic stenosis right carotid bifurcation. Right internal carotid artery is small but patent. Moderate atherosclerotic stenosis left internal carotid artery. Chronic occlusion right jugular vein. Left jugular vein patent. Limited intracranial: No enhancing lesion in the brain. Visualized orbits: Negative Mastoids and visualized paranasal sinuses: Minimal mucosal edema paranasal sinuses. Mastoid clear bilaterally. Skeleton: Destructive mass lesion in the right skull base lateral to the  clivus has developed since the prior CT. In retrospect  this is seen on the prior MRI. No significant intracranial extension of tumor. Destructive mass in the right lateral C3 vertebral body appears progressive. There is destruction of the right C3 pedicle and progressive destruction of the lamina on the right. There is tumor filling the C3-4 foramen on the right. Progressive epidural tumor in the canal on the right without cord compression. Destructive mass lesion on the right at T2 is unchanged and appears to be a treated metastatic deposit. Associated levoscoliosis unchanged. Upper chest: Chest CT from today reported separately Other: None IMPRESSION: 1. Progressive metastatic disease in the right skull base involving the right occipital bone. No intracranial extension. 2. Progression of metastatic disease to the right lateral mass of C3 with progressive bone destruction and progressive epidural tumor in the canal. No spinal stenosis. There is tumor filling the right C3-4 foramen. 3. Stable treated metastatic disease right T2 vertebral body 4. Necrotic retropharyngeal node on the right slightly smaller. Stable lymph nodes in the right neck. 5. Post radiation changes in the neck as above. 6. Severe stenosis right internal carotid artery due to atherosclerotic calcification with string sign. Moderate stenosis left internal carotid artery. Electronically Signed   By: Franchot Gallo M.D.   On: 03/21/2021 15:43   CT Cervical Spine Wo Contrast  Result Date: 02/05/2021 CLINICAL DATA:  Fall. Neck trauma. History of or pharyngeal carcinoma and left upper lobe squamous cell lung carcinoma. EXAM: CT CERVICAL SPINE WITHOUT CONTRAST TECHNIQUE: Multidetector CT imaging of the cervical spine was performed without intravenous contrast. Multiplanar CT image reconstructions were also generated. COMPARISON:  Neck CT 12/11/2020 FINDINGS: Alignment: No subluxation. Skull base and vertebrae: Destructive lesions in the right side of the C4 vertebral body and posterior elements and T2  vertebral body are again noted. T2 is stable. Progressive destruction noted in the right side of C4 compatible with metastatic disease. No fracture. Soft tissues and spinal canal: No prevertebral fluid or swelling. No visible canal hematoma. Disc levels: Advanced diffuse degenerative disc disease and facet disease bilaterally. Upper chest: Cystic area/lesion in the left upper lobe measures 14 mm, stable. Other: No acute findings IMPRESSION: Stable destructive process in the right side of the T2 vertebral body. Progressive destructive process in the right side and posterior elements of C4. Advanced diffuse degenerative disc and facet disease. No fracture. Electronically Signed   By: Rolm Baptise M.D.   On: 02/05/2021 18:21   MR Cervical Spine W Wo Contrast  Result Date: 03/06/2021 CLINICAL DATA:  Head and neck cancer, C spine lesion EXAM: MRI CERVICAL SPINE WITHOUT AND WITH CONTRAST TECHNIQUE: Multiplanar and multiecho pulse sequences of the cervical spine, to include the craniocervical junction and cervicothoracic junction, were obtained without and with intravenous contrast. CONTRAST:  51m GADAVIST GADOBUTROL 1 MMOL/ML IV SOLN COMPARISON:  Prior CT imaging FINDINGS: Alignment: Minor degenerative listhesis. Vertebrae: Abnormal marrow signal and enhancement at C4 with involvement of the right posterior elements. Abnormal marrow signal at the inferior C3 endplate may reflect additional disease or may be on a degenerative basis. Abnormal signal at the right aspect of the T2 vertebral body with pathologic fracture. Multilevel degenerative endplate irregularity. Cord: No abnormal signal.  No abnormal intrathecal enhancement. Posterior Fossa, vertebral arteries, paraspinal tissues: Right vertebral artery is surrounded by abnormal soft tissue at the C4 level but the flow void remains preserved. Mild prevertebral edema. Right retropharyngeal cystic/necrotic lesion measures similar to prior CT (series 5, image 4). Left  cerebellar developmental venous anomaly. Disc levels: C2-C3:  Fusion of facets.  No significant stenosis. C3-C4: Disc bulge with endplate osteophytes. Uncovertebral and facet hypertrophy. Minor right ventral, dorsal, and lateral epidural extension of above described disease without canal stenosis. Above described disease involves the right neural foramen. Mild left foraminal degenerative stenosis. C4-C5: Disc bulge with endplate osteophytes. Uncovertebral and facet hypertrophy. Above described disease encroaches on the right neural foramen. There is no significant canal or left foraminal stenosis. C5-C6: Disc bulge with endplate osteophytes. Uncovertebral and facet hypertrophy. Effacement of the ventral subarachnoid space without significant canal stenosis. Mild right foraminal stenosis. No left foraminal stenosis. C6-C7: Disc bulge with endplate osteophytes. Uncovertebral and facet hypertrophy. Partial effacement of the ventral subarachnoid space without significant canal stenosis. Minor right foraminal stenosis. No left foraminal stenosis. C7-T1: Disc bulge with endplate osteophytes. Facet hypertrophy. No canal or right foraminal stenosis. Minor left foraminal stenosis. IMPRESSION: Destructive metastatic lesion at C4 involving the right posterior elements. There is mild epidural disease without canal stenosis. Involvement of right C3-C4 and C4-C5 foramina. Abnormal signal at the inferior C3 endplate is probably on a degenerative basis, less likely additional metastasis. Destructive metastatic lesion at T2 with pathologic fracture. Probably no substantial progression from December 2022 cervical spine CT. Electronically Signed   By: Macy Mis M.D.   On: 03/06/2021 12:14   CT CHEST ABDOMEN PELVIS W CONTRAST  Result Date: 03/21/2021 CLINICAL DATA:  Left upper lobe squamous cell carcinoma, metastatic oropharyngeal carcinoma restaging EXAM: CT CHEST, ABDOMEN, AND PELVIS WITH CONTRAST TECHNIQUE: Multidetector CT  imaging of the chest, abdomen and pelvis was performed following the standard protocol during bolus administration of intravenous contrast. RADIATION DOSE REDUCTION: This exam was performed according to the departmental dose-optimization program which includes automated exposure control, adjustment of the mA and/or kV according to patient size and/or use of iterative reconstruction technique. CONTRAST:  120m OMNIPAQUE IOHEXOL 300 MG/ML SOLN additional oral enteric contrast COMPARISON:  12/11/2020 FINDINGS: CT CHEST FINDINGS Cardiovascular: Right chest port catheter. Aortic atherosclerosis. Normal heart size. Extensive three-vessel coronary artery calcifications. No pericardial effusion. Mediastinum/Nodes: No enlarged mediastinal, hilar, or axillary lymph nodes. Thyroid gland, trachea, and esophagus demonstrate no significant findings. Lungs/Pleura: Continued slight interval decrease in size of a subpleural nodule of the peripheral posterior left upper lobe, measuring 2.2 x 2.2 cm, previously 2.7 x 2.3 cm, with adjacent ground-glass and retraction (series 5, image 65). Interval increase in size of a cystic lesion of the paramedian left upper lobe, measuring 1.6 x 1.4 cm with new wall thickening, previously 1.1 x 1.1 cm (series 5, image 26). Interval increase in size of a multiple additional subpleural cystic lesions of the anterior left upper lobe, measuring 1.5 x 1.1 cm, previously 1.1 x 0.7 cm, however this remains thin walled (series 5, image 65), as well as similar lesions of the bilateral lower lobes (series 5, image 115). Interval increase in size of a subpleural nodule of the medial anterior right upper lobe, measuring 1.2 x 0.8 cm, previously 0.7 x 0.5 cm (series 5, image 76). There is additionally new irregular ground-glass and consolidation in the dependent right upper lobe (series 5, image 50), largest nodular component in this vicinity measuring 1.1 x 0.9 cm (series 5, image 57). New scattered  centrilobular nodularity and ground-glass in the lateral segment right middle lobe (series 5, image 102), with a discrete nodule in this vicinity measuring 0.5 x 0.4 cm (series 5, image 115). Mild predominantly paraseptal emphysema. No pleural effusion or pneumothorax.  Musculoskeletal: No chest wall mass or suspicious osseous lesions identified. CT ABDOMEN PELVIS FINDINGS Hepatobiliary: No solid liver abnormality is seen. No gallstones, gallbladder wall thickening, or biliary dilatation. Pancreas: Unremarkable. No pancreatic ductal dilatation or surrounding inflammatory changes. Spleen: Normal in size without significant abnormality. Adrenals/Urinary Tract: Adrenal glands are unremarkable. Kidneys are normal, without renal calculi, solid lesion, or hydronephrosis. Bladder is unremarkable. Stomach/Bowel: Stomach is within normal limits. Appendix appears normal. No evidence of bowel wall thickening, distention, or inflammatory changes. Pancolonic diverticulosis. Vascular/Lymphatic: No significant vascular findings are present. No enlarged abdominal or pelvic lymph nodes. Reproductive: Prostate brachytherapy. Other: No abdominal wall hernia or abnormality. No ascites. Musculoskeletal: No acute osseous findings. IMPRESSION: 1. Continued slight interval decrease in size of a subpleural nodule of the peripheral posterior left upper lobe, with adjacent ground-glass and retraction, consistent with evolving radiation pneumonitis and fibrosis about a treated nodule. 2. There are however multiple bilateral cystic pulmonary lesions, which are increased in size, at least one of which with significant wall thickening, although unusual in appearance highly concerning for squamous cell metastases given enlargement and wall thickening. 3. Interval enlargement of a solid pulmonary nodule of the right upper lobe, consistent with an enlarging metastasis. 4. There is additionally new irregular ground-glass and consolidation in the  dependent right upper lobe, as well as in the lateral segment right middle lobe, configuration and distribution of these findings most suggestive of nonspecific infection or aspiration, however an additional manifestation of malignancy is difficult to exclude. Attention on follow-up. 5. Emphysema. 6. Coronary artery disease. Aortic Atherosclerosis (ICD10-I70.0). Electronically Signed   By: Delanna Ahmadi M.D.   On: 03/21/2021 13:51   DG Chest Portable 1 View  Result Date: 01/11/2021 CLINICAL DATA:  Weakness.  Stage IV cancer. EXAM: PORTABLE CHEST 1 VIEW COMPARISON:  06/29/2019.  CT 12/11/2020. FINDINGS: Power port on the right with tip in the proximal right atrium. Focal density in the left lateral chest related to previous mass with subsequent radiation change. See results of CT scan 12/11/2020. Question mild patchy infiltrate in the right lower lobe. Bilateral nipple shadows and bilateral pleural calcifications remain visible. IMPRESSION: Focal density in the left lateral midlung related to tumor with previous radiation treatment change. Question mild patchy pneumonia at the right lung base. Bilateral nipple shadows and pleural calcifications as seen previously. Electronically Signed   By: Nelson Chimes M.D.   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[facility]) Staging form: Pharynx - P16 Negative Oropharynx, AJCC 8th Edition - Clinical stage from 06/23/2019: Stage IVB (cT4b, cN2b, cM0, p16-) - Signed by Earlie Server, MD on 06/23/2019  Squamous cell lung cancer ([place]) Staging form: Lung, AJCC 8th  Edition - Clinical: cT1, cN0, cM0 - Signed by Earlie Server, MD on 07/19/2019   #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 -02/2021 neurosurgery recommended no intervention -January 2023 CT showed progression in lung lesions. Labs reviewed and discussed with patient. Recent MRI, CT findings were reviewed. I had a lengthy discussion with patient, his wife, and also discussed with his son over the phone. Progressive disease in his lungs could be secondary to metastatic head and neck SCC or metastatic lung SCC. They are aware that patient's disease has progressed while on Keytruda and I recommend adding chemotherapy to his regimen if he desires treatment.  Given his dementia, performance status, I do not think he is able to tolerate combination chemotherapy.  Recommend single agent chemotherapy option, such as carboplatin, 5-FU, etc.  Patient has a high alpha gal IgG level and I will avoid cetuximab.  Alternative of not continuing any additional treatments and focus on life quality, proceed with comfort care/hospice was discussed. Rationale and potential side effects of carboplatin was discussed with patient and his family members and they would like to try additional treatments.  Plan carboplatin AUC 4. Patient will proceed with Keytruda today.  He will return next week lab and proceed with  carboplatin. .. #Hypomagnesia, continue oral Slow-Mag, continue 1 tablet twice daily.    #Left upper lobe squamous cell carcinoma, stage I Finished SBRT on 02/07/2020. -See above  #Right jugular vein thrombus, with his cancer progression, I recommend patient to stay on Eliquis 2.5 mg twice daily for prophylaxis..   #weight has been stable.  Continue nutrition supplementation.    All questions were answered. The patient knows to call the clinic with any problems questions or concerns.   Return of visit: Lab carboplatin next week Follow-up lab MD IV fluid 1 week  ZEarlie Server MD, PhD 03/23/2021

## 2021-03-25 ENCOUNTER — Other Ambulatory Visit: Payer: Self-pay | Admitting: Oncology

## 2021-03-27 ENCOUNTER — Inpatient Hospital Stay: Payer: Medicare Other

## 2021-03-27 ENCOUNTER — Encounter: Payer: Self-pay | Admitting: Oncology

## 2021-03-27 ENCOUNTER — Other Ambulatory Visit: Payer: Self-pay

## 2021-03-27 VITALS — BP 121/54 | HR 57 | Temp 97.5°F | Resp 16 | Wt 102.2 lb

## 2021-03-27 DIAGNOSIS — C329 Malignant neoplasm of larynx, unspecified: Secondary | ICD-10-CM

## 2021-03-27 DIAGNOSIS — Z5112 Encounter for antineoplastic immunotherapy: Secondary | ICD-10-CM | POA: Diagnosis not present

## 2021-03-27 LAB — CBC WITH DIFFERENTIAL/PLATELET
Abs Immature Granulocytes: 0.02 10*3/uL (ref 0.00–0.07)
Basophils Absolute: 0 10*3/uL (ref 0.0–0.1)
Basophils Relative: 0 %
Eosinophils Absolute: 0.1 10*3/uL (ref 0.0–0.5)
Eosinophils Relative: 2 %
HCT: 34.2 % — ABNORMAL LOW (ref 39.0–52.0)
Hemoglobin: 10.8 g/dL — ABNORMAL LOW (ref 13.0–17.0)
Immature Granulocytes: 0 %
Lymphocytes Relative: 10 %
Lymphs Abs: 0.7 10*3/uL (ref 0.7–4.0)
MCH: 29.3 pg (ref 26.0–34.0)
MCHC: 31.6 g/dL (ref 30.0–36.0)
MCV: 92.7 fL (ref 80.0–100.0)
Monocytes Absolute: 0.6 10*3/uL (ref 0.1–1.0)
Monocytes Relative: 9 %
Neutro Abs: 5.7 10*3/uL (ref 1.7–7.7)
Neutrophils Relative %: 79 %
Platelets: 238 10*3/uL (ref 150–400)
RBC: 3.69 MIL/uL — ABNORMAL LOW (ref 4.22–5.81)
RDW: 13.8 % (ref 11.5–15.5)
WBC: 7.2 10*3/uL (ref 4.0–10.5)
nRBC: 0 % (ref 0.0–0.2)

## 2021-03-27 LAB — COMPREHENSIVE METABOLIC PANEL
ALT: 17 U/L (ref 0–44)
AST: 20 U/L (ref 15–41)
Albumin: 3.6 g/dL (ref 3.5–5.0)
Alkaline Phosphatase: 75 U/L (ref 38–126)
Anion gap: 7 (ref 5–15)
BUN: 18 mg/dL (ref 8–23)
CO2: 29 mmol/L (ref 22–32)
Calcium: 9.4 mg/dL (ref 8.9–10.3)
Chloride: 102 mmol/L (ref 98–111)
Creatinine, Ser: 0.77 mg/dL (ref 0.61–1.24)
GFR, Estimated: >60 mL/min (ref >60)
Glucose, Bld: 114 mg/dL — ABNORMAL HIGH (ref 70–99)
Potassium: 3.9 mmol/L (ref 3.5–5.1)
Sodium: 138 mmol/L (ref 135–145)
Total Bilirubin: 0.5 mg/dL (ref 0.3–1.2)
Total Protein: 7.3 g/dL (ref 6.5–8.1)

## 2021-03-27 MED ORDER — PALONOSETRON HCL INJECTION 0.25 MG/5ML
0.2500 mg | Freq: Once | INTRAVENOUS | Status: AC
  Administered 2021-03-27: 0.25 mg via INTRAVENOUS
  Filled 2021-03-27: qty 5

## 2021-03-27 MED ORDER — DIPHENHYDRAMINE HCL 50 MG/ML IJ SOLN
50.0000 mg | Freq: Once | INTRAMUSCULAR | Status: AC
  Administered 2021-03-27: 50 mg via INTRAVENOUS
  Filled 2021-03-27: qty 1

## 2021-03-27 MED ORDER — SODIUM CHLORIDE 0.9% FLUSH
10.0000 mL | INTRAVENOUS | Status: DC | PRN
  Administered 2021-03-27: 10 mL via INTRAVENOUS
  Filled 2021-03-27: qty 10

## 2021-03-27 MED ORDER — HEPARIN SOD (PORK) LOCK FLUSH 100 UNIT/ML IV SOLN
INTRAVENOUS | Status: AC
  Administered 2021-03-27: 500 [IU]
  Filled 2021-03-27: qty 5

## 2021-03-27 MED ORDER — SODIUM CHLORIDE 0.9 % IV SOLN
150.0000 mg | Freq: Once | INTRAVENOUS | Status: AC
  Administered 2021-03-27: 150 mg via INTRAVENOUS
  Filled 2021-03-27: qty 150

## 2021-03-27 MED ORDER — SODIUM CHLORIDE 0.9 % IV SOLN
INTRAVENOUS | Status: DC
  Filled 2021-03-27: qty 250

## 2021-03-27 MED ORDER — HEPARIN SOD (PORK) LOCK FLUSH 100 UNIT/ML IV SOLN
500.0000 [IU] | Freq: Once | INTRAVENOUS | Status: AC
  Filled 2021-03-27: qty 5

## 2021-03-27 MED ORDER — SODIUM CHLORIDE 0.9 % IV SOLN
257.2000 mg | Freq: Once | INTRAVENOUS | Status: AC
  Administered 2021-03-27: 260 mg via INTRAVENOUS
  Filled 2021-03-27: qty 26

## 2021-03-27 MED ORDER — SODIUM CHLORIDE 0.9 % IV SOLN
10.0000 mg | Freq: Once | INTRAVENOUS | Status: AC
  Administered 2021-03-27: 10 mg via INTRAVENOUS
  Filled 2021-03-27: qty 10

## 2021-03-27 NOTE — Patient Instructions (Signed)
Carboplatin injection What is this medication? CARBOPLATIN (KAR boe pla tin) is a chemotherapy drug. It targets fast dividing cells, like cancer cells, and causes these cells to die. This medicine is used to treat ovarian cancer and many other cancers. This medicine may be used for other purposes; ask your health care provider or pharmacist if you have questions. COMMON BRAND NAME(S): Paraplatin What should I tell my care team before I take this medication? They need to know if you have any of these conditions: blood disorders hearing problems kidney disease recent or ongoing radiation therapy an unusual or allergic reaction to carboplatin, cisplatin, other chemotherapy, other medicines, foods, dyes, or preservatives pregnant or trying to get pregnant breast-feeding How should I use this medication? This drug is usually given as an infusion into a vein. It is administered in a hospital or clinic by a specially trained health care professional. Talk to your pediatrician regarding the use of this medicine in children. Special care may be needed. Overdosage: If you think you have taken too much of this medicine contact a poison control center or emergency room at once. NOTE: This medicine is only for you. Do not share this medicine with others. What if I miss a dose? It is important not to miss a dose. Call your doctor or health care professional if you are unable to keep an appointment. What may interact with this medication? medicines for seizures medicines to increase blood counts like filgrastim, pegfilgrastim, sargramostim some antibiotics like amikacin, gentamicin, neomycin, streptomycin, tobramycin vaccines Talk to your doctor or health care professional before taking any of these medicines: acetaminophen aspirin ibuprofen ketoprofen naproxen This list may not describe all possible interactions. Give your health care provider a list of all the medicines, herbs, non-prescription  drugs, or dietary supplements you use. Also tell them if you smoke, drink alcohol, or use illegal drugs. Some items may interact with your medicine. What should I watch for while using this medication? Your condition will be monitored carefully while you are receiving this medicine. You will need important blood work done while you are taking this medicine. This drug may make you feel generally unwell. This is not uncommon, as chemotherapy can affect healthy cells as well as cancer cells. Report any side effects. Continue your course of treatment even though you feel ill unless your doctor tells you to stop. In some cases, you may be given additional medicines to help with side effects. Follow all directions for their use. Call your doctor or health care professional for advice if you get a fever, chills or sore throat, or other symptoms of a cold or flu. Do not treat yourself. This drug decreases your body's ability to fight infections. Try to avoid being around people who are sick. This medicine may increase your risk to bruise or bleed. Call your doctor or health care professional if you notice any unusual bleeding. Be careful brushing and flossing your teeth or using a toothpick because you may get an infection or bleed more easily. If you have any dental work done, tell your dentist you are receiving this medicine. Avoid taking products that contain aspirin, acetaminophen, ibuprofen, naproxen, or ketoprofen unless instructed by your doctor. These medicines may hide a fever. Do not become pregnant while taking this medicine. Women should inform their doctor if they wish to become pregnant or think they might be pregnant. There is a potential for serious side effects to an unborn child. Talk to your health care professional or pharmacist  for more information. Do not breast-feed an infant while taking this medicine. What side effects may I notice from receiving this medication? Side effects that you  should report to your doctor or health care professional as soon as possible: allergic reactions like skin rash, itching or hives, swelling of the face, lips, or tongue signs of infection - fever or chills, cough, sore throat, pain or difficulty passing urine signs of decreased platelets or bleeding - bruising, pinpoint red spots on the skin, black, tarry stools, nosebleeds signs of decreased red blood cells - unusually weak or tired, fainting spells, lightheadedness breathing problems changes in hearing changes in vision chest pain high blood pressure low blood counts - This drug may decrease the number of white blood cells, red blood cells and platelets. You may be at increased risk for infections and bleeding. nausea and vomiting pain, swelling, redness or irritation at the injection site pain, tingling, numbness in the hands or feet problems with balance, talking, walking trouble passing urine or change in the amount of urine Side effects that usually do not require medical attention (report to your doctor or health care professional if they continue or are bothersome): hair loss loss of appetite metallic taste in the mouth or changes in taste This list may not describe all possible side effects. Call your doctor for medical advice about side effects. You may report side effects to FDA at 1-800-FDA-1088. Where should I keep my medication? This drug is given in a hospital or clinic and will not be stored at home. NOTE: This sheet is a summary. It may not cover all possible information. If you have questions about this medicine, talk to your doctor, pharmacist, or health care provider.  2022 Elsevier/Gold Standard (2007-07-22 00:00:00) [facility] [place] ONCOLOGY  Discharge Instructions: Thank you for choosing [facility] to provide your oncology and hematology care.  If you have a lab appointment with the [facility], please go directly to the [employer] and check in at the registration area.  Wear comfortable clothing and clothing appropriate for easy access to any Portacath or PICC line.   We strive to give you quality time with your provider. You may need to reschedule your appointment if you arrive late (15 or more minutes).  Arriving late affects you and other patients whose appointments are after yours.  Also, if you miss three or more appointments without notifying the office, you may be dismissed from the clinic at the providers discretion.      For prescription refill requests, have your pharmacy contact our office and allow 72 hours for refills to be completed.    Today you received the following chemotherapy and/or immunotherapy agents: Carboplatin      To help prevent nausea and vomiting after your treatment, we encourage you to take your nausea medication as directed.  BELOW ARE SYMPTOMS THAT SHOULD BE REPORTED IMMEDIATELY: *FEVER GREATER THAN 100.4 F (38 C) OR HIGHER *CHILLS OR SWEATING *NAUSEA AND VOMITING THAT IS NOT CONTROLLED WITH YOUR NAUSEA MEDICATION *UNUSUAL SHORTNESS OF BREATH *UNUSUAL BRUISING OR BLEEDING *URINARY PROBLEMS (pain or burning when urinating, or frequent urination) *BOWEL PROBLEMS (unusual diarrhea, constipation, pain near the anus) TENDERNESS IN MOUTH AND THROAT WITH OR WITHOUT PRESENCE OF ULCERS (sore throat, sores in mouth, or a toothache) UNUSUAL RASH, SWELLING OR PAIN  UNUSUAL VAGINAL DISCHARGE OR ITCHING   Items with * indicate a potential emergency and should be followed up as soon as possible or go  to the Emergency Department if any problems should occur.  Please show the CHEMOTHERAPY ALERT CARD or IMMUNOTHERAPY ALERT CARD at check-in to the Emergency Department and triage nurse.  Should you have questions after your visit or need to cancel or reschedule your appointment, please contact [facility] [place] [facility] [place]  [phone] and follow the prompts.  Office hours  are 8:00 a.m. to 4:30 p.m. Monday - Friday. Please note that voicemails left after 4:00 p.m. may not be returned until the following business day.  We are closed weekends and major holidays. You have access to a nurse at all times for urgent questions. Please call the main number to the clinic [phone] and follow the prompts.  For any non-urgent questions, you may also contact your provider using MyChart. We now offer e-Visits for anyone 34 and older to request care online for non-urgent symptoms. For details visit [email or web address].   Also download the MyChart app! Go to the app store, search "MyChart", open the app, select [facility], and log in with your MyChart username and password.  Due to Covid, a mask is required upon entering the hospital/clinic. If you do not have a mask, one will be given to you upon arrival. For doctor visits, patients may have 1 support person aged 56 or older with them. For treatment visits, patients cannot have anyone with them due to current Covid guidelines and our immunocompromised population.

## 2021-03-30 ENCOUNTER — Ambulatory Visit: Payer: Medicare Other

## 2021-04-04 ENCOUNTER — Inpatient Hospital Stay: Payer: Medicare Other

## 2021-04-04 ENCOUNTER — Inpatient Hospital Stay: Payer: Medicare Other | Attending: Oncology

## 2021-04-04 ENCOUNTER — Inpatient Hospital Stay (HOSPITAL_BASED_OUTPATIENT_CLINIC_OR_DEPARTMENT_OTHER): Payer: Medicare Other | Admitting: Oncology

## 2021-04-04 ENCOUNTER — Other Ambulatory Visit: Payer: Self-pay

## 2021-04-04 ENCOUNTER — Encounter: Payer: Self-pay | Admitting: Oncology

## 2021-04-04 VITALS — BP 144/75 | HR 69 | Temp 98.7°F | Resp 20 | Wt 99.0 lb

## 2021-04-04 DIAGNOSIS — C320 Malignant neoplasm of glottis: Secondary | ICD-10-CM | POA: Diagnosis present

## 2021-04-04 DIAGNOSIS — C329 Malignant neoplasm of larynx, unspecified: Secondary | ICD-10-CM

## 2021-04-04 DIAGNOSIS — C109 Malignant neoplasm of oropharynx, unspecified: Secondary | ICD-10-CM | POA: Diagnosis not present

## 2021-04-04 DIAGNOSIS — Z5112 Encounter for antineoplastic immunotherapy: Secondary | ICD-10-CM | POA: Insufficient documentation

## 2021-04-04 DIAGNOSIS — C3492 Malignant neoplasm of unspecified part of left bronchus or lung: Secondary | ICD-10-CM | POA: Diagnosis not present

## 2021-04-04 DIAGNOSIS — R634 Abnormal weight loss: Secondary | ICD-10-CM

## 2021-04-04 DIAGNOSIS — Z79899 Other long term (current) drug therapy: Secondary | ICD-10-CM | POA: Diagnosis not present

## 2021-04-04 DIAGNOSIS — Z5111 Encounter for antineoplastic chemotherapy: Secondary | ICD-10-CM | POA: Diagnosis not present

## 2021-04-04 DIAGNOSIS — E860 Dehydration: Secondary | ICD-10-CM

## 2021-04-04 LAB — CBC WITH DIFFERENTIAL/PLATELET
Abs Immature Granulocytes: 0.03 10*3/uL (ref 0.00–0.07)
Basophils Absolute: 0 10*3/uL (ref 0.0–0.1)
Basophils Relative: 0 %
Eosinophils Absolute: 0.1 10*3/uL (ref 0.0–0.5)
Eosinophils Relative: 2 %
HCT: 32.8 % — ABNORMAL LOW (ref 39.0–52.0)
Hemoglobin: 10.4 g/dL — ABNORMAL LOW (ref 13.0–17.0)
Immature Granulocytes: 1 %
Lymphocytes Relative: 11 %
Lymphs Abs: 0.7 10*3/uL (ref 0.7–4.0)
MCH: 29.3 pg (ref 26.0–34.0)
MCHC: 31.7 g/dL (ref 30.0–36.0)
MCV: 92.4 fL (ref 80.0–100.0)
Monocytes Absolute: 0.6 10*3/uL (ref 0.1–1.0)
Monocytes Relative: 11 %
Neutro Abs: 4.5 10*3/uL (ref 1.7–7.7)
Neutrophils Relative %: 75 %
Platelets: 208 10*3/uL (ref 150–400)
RBC: 3.55 MIL/uL — ABNORMAL LOW (ref 4.22–5.81)
RDW: 13.8 % (ref 11.5–15.5)
WBC: 5.9 10*3/uL (ref 4.0–10.5)
nRBC: 0 % (ref 0.0–0.2)

## 2021-04-04 LAB — COMPREHENSIVE METABOLIC PANEL
ALT: 16 U/L (ref 0–44)
AST: 20 U/L (ref 15–41)
Albumin: 3.6 g/dL (ref 3.5–5.0)
Alkaline Phosphatase: 75 U/L (ref 38–126)
Anion gap: 8 (ref 5–15)
BUN: 20 mg/dL (ref 8–23)
CO2: 28 mmol/L (ref 22–32)
Calcium: 9.4 mg/dL (ref 8.9–10.3)
Chloride: 103 mmol/L (ref 98–111)
Creatinine, Ser: 0.68 mg/dL (ref 0.61–1.24)
GFR, Estimated: >60 mL/min (ref >60)
Glucose, Bld: 110 mg/dL — ABNORMAL HIGH (ref 70–99)
Potassium: 4 mmol/L (ref 3.5–5.1)
Sodium: 139 mmol/L (ref 135–145)
Total Bilirubin: 0.4 mg/dL (ref 0.3–1.2)
Total Protein: 7.2 g/dL (ref 6.5–8.1)

## 2021-04-04 MED ORDER — NYSTATIN 100000 UNIT/ML MT SUSP: 5.0000 mL | Freq: Four times a day (QID) | OROMUCOSAL | 1 refills | Status: DC

## 2021-04-04 MED ORDER — PROCHLORPERAZINE MALEATE 10 MG PO TABS: 10.0000 mg | ORAL_TABLET | Freq: Three times a day (TID) | ORAL | 0 refills | Status: AC | PRN

## 2021-04-04 MED ORDER — SODIUM CHLORIDE 0.9 % IV SOLN
Freq: Once | INTRAVENOUS | Status: AC
  Filled 2021-04-04: qty 250

## 2021-04-04 MED ORDER — HEPARIN SOD (PORK) LOCK FLUSH 100 UNIT/ML IV SOLN
500.0000 [IU] | Freq: Once | INTRAVENOUS | Status: AC
  Administered 2021-04-04: 500 [IU] via INTRAVENOUS
  Filled 2021-04-04: qty 5

## 2021-04-04 NOTE — Progress Notes (Signed)
Hematology/Oncology Progress note [phone] [phone]      Patient Care Team: Casilda Carls, MD as PCP - General (Internal Medicine) Earlie Server, [name], [name]  HISTORY OF PRESENTING ILLNESS:  History of prostate cancer. 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  PD-L1-IHC 40%, no targetable mutations. # 09/07/2019 chemotherapy [cisplatin] and radiation.   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  Interval decrease of the posterior left upper lobe pleural-based nodule with interval decrease in size of the tiny adjacent satellite nodule.  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  No recurrent tumor in right piriform sinus.  Right lateral pharyngeal lymph node is stable.  No new or recurrent adenopathy.Suspect metastasis.  I recommend patient to obtain PET scan restaging.  I discussed about the plan of re-biopsy of either the paraspinal soft tissue mass versus other more feasible hypermetabolic sites detected on PET scan. Patient was seen by Dr. Kennyth Lose and opted to proceed with palliative radiation as soon as possible.  PET scan cannot be arranged prior to the radiation so the PET scan was canceled. 04/20/2020 -04/25/2020, palliative radiation to thoracic spine  05/10/2020, MRI thoracic spine was obtained for  worsening of back pain and left shoulder pain.  Images showed metastatic disease throughout almost the entire T2 with and associated mild superior endplate compression fracture.  Abnormal signal in the anterior, inferior endplate of T1, superior aspect of the T3.  Small metastatic deposit in T12 is also noted. Case was also discussed on multidisciplinary tumor board on 05/25/2020 05/22/2020, PET scan showed new hypermetabolic lytic bone metastasis in the left scapular near the glenoid and in the T2 vertebral body.  Persistent hypermetabolic right lateral retropharyngeal and right level 3 neck lymph node metastasis.  Peripheral left upper lobe pulmonary nodule is mildly decreased with new surrounding mild platelike postradiation changes..  No residual recurrent neoplasm at the site of vomiting in the right piriform sinus  06/07/2020 -06/09/2020 patient underwent palliative radiation to left scapular April 2022 seen by [name] and flexible laryngoscope examination showed new lesion at the right false vocal cord.  Case was discussed on tumor board on 06/22/2020. [name] recommend additional CT soft tissue neck with contrast for additional evaluation. 06/29/2020 CT images were reviewed and discussed with patient. There is new malignant lymph node in the right mid neck at the level of the hyoid, this compressing the right jugular vein with thrombus in the vein above and below the lymph node.  Cystic retropharyngeal lymph node on the right is stable.  Progressive soft tissue thickening in the larynx bilaterally suspicious for tumor.  Metastatic disease at the T2 is unchanged.  Metastatic deposit left scapula there is now a pathological fracture of the scapular. CT scan findings were discussed with ENT [name] via secure chat.  [name] feels surgery is not feasible.  The larynx lesion currently does not  compromise his airway however further progression may in the future  07/10/2020 Seen by Radonc  [name] who would offer salvage radiation to his larynx as well as neck adenopathy. Would like low dose chemotherapy as sensitizer.    Patient was also referred to establish care with Dr. Georgiann Cocker at [facility] for kyphoplasty.  Patient's wife reports that they were never contacted and she called Dr. Georgiann Cocker office with no success.  # NGS.  PD-L1-IHC 40%, no targetable mutations # 07/25/20- 07/27/2020, 08/01/20- 09/06/2020  concurrent chemoradiation [low-dose weekly cisplatin 6m/m2 ] to the larynx  09/15/2020, patient was seen by ENT Dr. MTami Ribas  Laryngoscope examination reviewed clear larynx, no lesion was discovered. 10/04/2020, started on maintenance Keytruda. 12/11/2020 restaging CT 1. Progression of Disease: - new since May right C3 destructive osseous metastasis has eroded the right pedicle and facet, with tumor filling the right C4 neural foramen and engulfing the right vertebral artery which remains patent. Mild right lateral epidural tumor. - small (8 mm short axis) but progressed right level 2A nodal disease with evidence of extracapsular extension. 2. 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  3. No recurrent laryngeal or pharyngeal tumor identified. 4. 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 CT Chest, Abdomen, and Pelvis the same day are reported separately.  01/16/2021, patient underwent additional palliative radiation to C-spine. 02/05/2021 Patient had a fall and went to emergency room on .   CT cervical spine without contrast showed stable destructive process in the right side of the T3 vertebral body.  Progressive destructive process in the right side and the posterior element of C4.  No fracture.  03/06/2021, MRI cervical spine with and without contrast showed destructive metastatic lesion at C4 involving the right posterior  elements.  There is mild epidural disease without canal stenosis.  Involvement of right C3-C4 and C4-C5 foraminal.  Abnormal signal at the inferior C3 endplate is probably on degenerative basis.  Destructive metastatic lesion at T2 with pathological fracture. 03/09/2021 patient was seen by neurosurgeon Dr. YCari Caraway  Based on his current disease burden, dementia, lack of obvious unstable lesion, surgical fixation was not recommended.  Imaging surveillance was recommended and Dr. YCari Carawayis happy to review his imaging over time.  If he has worsening tumor burden, could consider vertebroplasty at T2 or at C4. 03/20/2021, CT chest abdomen pelvis with contrast showed Left upper lobe subpleural nodule[previously treated with radiation] showed interval decrease, with evolving radiation pneumonitis/fibrosis. Multiple bilateral cystic pulmonary lesions, increased in size, at least 1 of which with significant wall thickening.  Interval enlargement of a solid nodule of the right upper lobe, consistent with an enlarging metastasis.  New irregular groundglass and consolidation in the dependent right upper lobe, lateral segment right middle lobe, suggesting nonspecific infection or aspiration.  An additional manifestation of malignancy is difficult to exclude.  Emphysema.  CAD.  INTERVAL HISTORY RWeyman Burton a 81y.o. male who has above history reviewed by me today presents for follow-up of head and neck cancer and lung cancer Patient is on Keytruda treatments.  Dose reduced carboplatin was added last week. Patient reports feeling at her baseline.  Accompanied by wife.  Per wife, appetite is fair, he is similar portion since last treatment.  No nausea vomiting diarrhea.  Chronic right shoulder stiffness/pain.   Review of Systems  Constitutional:  Positive for fatigue. Negative for appetite change, chills, fever and  unexpected weight change.  HENT:   Positive for hearing loss.   Eyes:  Negative for eye  problems and icterus.  Respiratory:  Negative for chest tightness, cough and shortness of breath.   Cardiovascular:  Negative for chest pain and leg swelling.  Gastrointestinal:  Negative for abdominal distention, abdominal pain, blood in stool and nausea.  Endocrine: Negative for hot flashes.  Genitourinary:  Negative for difficulty urinating, dysuria and frequency.   Musculoskeletal:  Positive for arthralgias.  Skin:  Negative for itching and rash.  Neurological:  Negative for extremity weakness, light-headedness and numbness.  Hematological:  Negative for adenopathy. Does not bruise/bleed easily.  Psychiatric/Behavioral:  Negative for confusion.        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   Prostate CA ([place]) 2008   Squamous cell lung cancer ([place]) 06/23/2019    SURGICAL HISTORY: Past Surgical History:  Procedure Laterality Date   BRAIN SURGERY  2012   HERNIA REPAIR     PORTA CATH INSERTION N/A 07/27/2019   Procedure: PORTA CATH INSERTION;  Surgeon: Katha Cabal, MD;  Location: [facility];  Service: Cardiovascular;  Laterality: N/A;    SOCIAL HISTORY: Social History   Socioeconomic History   Marital status: Married    Spouse name: Not on file   Number of children: Not on file   Years of education: Not on file   Highest education level: Not on file  Occupational History   Not on file  Tobacco Use   Smoking status: Former    Years: 21.00    Types: Cigarettes    Quit date: 05/11/2008    Years since quitting: 12.9   Smokeless tobacco: Never  Vaping Use   Vaping Use: Never used  Substance and Sexual Activity   Alcohol use: Yes    Comment: 0.5 pint liquor a week   Drug use: Never   Sexual activity:  Not Currently  Other Topics Concern   Not on file  Social History Narrative   Lives at home with wife. Wife states he has some dementia but is able to sign his own consent.   Social Determinants of Health   Financial Resource Strain: Not on file  Food Insecurity: Not on file  Transportation Needs: Not on file  Physical Activity: Not on file  Stress: Not on file  Social Connections: Not on file  Intimate Partner Violence: Not on file    FAMILY HISTORY: History reviewed. No pertinent family history.  ALLERGIES:  is allergic to ace inhibitors and enalapril.  MEDICATIONS:  Current Outpatient Medications  Medication Sig Dispense Refill   donepezil (ARICEPT) 10 MG tablet Take 10 mg by mouth at bedtime.     dorzolamide-timolol (COSOPT) 22.3-6.8 MG/ML ophthalmic solution Place 1 drop into both eyes 2 (two) times daily.      ELIQUIS 2.5 MG TABS tablet TAKE 1 TABLET BY MOUTH TWICE A DAY 60 tablet 1   folic acid (FOLVITE) 1 MG tablet Take 1 mg by mouth daily.     latanoprost (XALATAN) 0.005 % ophthalmic solution Place 1 drop into both eyes at bedtime.      lidocaine-prilocaine (EMLA) cream Apply 1 application  topically as needed. Apply small amount to port site approx 1-2 hours prior to appointment. 30 g 1   MAG64 64 MG TBEC TAKE 1 TABLET (64 MG TOTAL) BY MOUTH 2 (TWO) TIMES DAILY. 180 tablet 0   Multiple Vitamins-Minerals (CENTRUM ADULTS PO) Take by mouth.     prochlorperazine (COMPAZINE) 10 MG tablet Take 1 tablet (10 mg total) by mouth every 8 (eight) hours as needed for nausea or vomiting. 60 tablet 0   Thiamine HCl (VITAMIN B-1 PO) Take 100 mg by mouth daily.     atorvastatin (LIPITOR) 20 MG tablet Take 20 mg by mouth daily. (Patient not taking: Reported on 04/04/2021)     colchicine 0.6 MG tablet Take 0.6 mg by mouth daily. SMARTSIG:1 Tablet(s) By Mouth Daily (Patient not taking: Reported on 02/06/2021)     nystatin (MYCOSTATIN) 100000 UNIT/ML suspension Take 5 mLs (500,000 Units total)  by mouth 4 (four) times daily. 480 mL 1   oxyCODONE (OXY IR/ROXICODONE) 5 MG immediate release tablet Take 1 tablet (5 mg total) by mouth every 4 (four) hours as needed for severe pain. (Patient not taking: Reported on 04/04/2021) 90 tablet 0   No current facility-administered medications for this visit.   Facility-Administered Medications Ordered in Other Visits  Medication Dose Route Frequency Provider Last Rate Last Admin   sodium chloride flush (NS) 0.9 % injection 10 mL  10 mL Intravenous PRN Earlie Server, MD   10 mL at 03/01/20 0812     PHYSICAL EXAMINATION: ECOG PERFORMANCE STATUS: 1 - Symptomatic but completely ambulatory Vitals:   04/04/21 0904  BP: (!) 144/75  Pulse: 69  Resp: 20  Temp: 98.7 F (37.1 C)  SpO2: 100%   Filed Weights   04/04/21 0904  Weight: 99 lb (44.9 kg)    Physical Exam Constitutional:      General: He is not in acute distress. HENT:     Head: Normocephalic and atraumatic.  Eyes:     General: No scleral icterus. Cardiovascular:     Rate and Rhythm: Normal rate and regular rhythm.     Heart sounds: Normal heart sounds.  Pulmonary:     Effort: Pulmonary effort is normal. No respiratory distress.     Breath sounds: No wheezing.  Abdominal:     General: Bowel sounds are normal. There is no distension.     Palpations: Abdomen is soft.  Musculoskeletal:        General: No deformity.     Cervical back: Normal range of motion and neck supple.  Lymphadenopathy:     Cervical: No cervical adenopathy.  Skin:    General: Skin is warm and dry.     Findings: No erythema or rash.  Neurological:     Mental Status: He is alert. Mental status is at baseline.     Cranial Nerves: No cranial nerve deficit.     Coordination: Coordination normal.    LABORATORY DATA:  I have reviewed the data as listed Lab Results  Component Value Date   WBC 5.9 04/04/2021   HGB 10.4 (L) 04/04/2021   HCT 32.8 (L) 04/04/2021   MCV 92.4 04/04/2021   PLT 208 04/04/2021    Recent Labs    01/11/21 0918 01/11/21 1351 03/23/21 0757 03/27/21 0829 04/04/21 0833  NA SPECIMEN CONTAMINATED, UNABLE TO PERFORM TEST(S).   < > 137 138 139  K SPECIMEN CONTAMINATED, UNABLE TO PERFORM TEST(S).   < > 3.5 3.9 4.0  CL SPECIMEN CONTAMINATED, UNABLE TO PERFORM TEST(S).   < >  102 102 103  CO2 SPECIMEN CONTAMINATED, UNABLE TO PERFORM TEST(S).   < > _0 GLUCOSE SPECIMEN CONTAMINATED, UNABLE TO PERFORM TEST(S).   < > 117* 114* 110*  BUN SPECIMEN CONTAMINATED, UNABLE TO PERFORM TEST(S).   < > _1 CREATININE SPECIMEN CONTAMINATED, UNABLE TO PERFORM TEST(S).   < > 0.76 0.77 0.68  CALCIUM SPECIMEN CONTAMINATED, UNABLE TO PERFORM TEST(S).   < > 8.9 9.4 9.4  GFRNONAA SPECIMEN CONTAMINATED, UNABLE TO PERFORM TEST(S).   < > >60 >60 >60  PROT 3.8*   < > 6.9 7.3 7.2  ALBUMIN 2.0*   < > 3.4* 3.6 3.6  AST 9*   < > _2 ALT 8   < > _3 ALKPHOS 30*   < > 74 75 75  BILITOT 0.5   < > 0.8 0.5 0.4  BILIDIR <0.1  --   --   --   --   IBILI NOT CALCULATED  --   --   --   --    < > = values in this interval not displayed.    Iron/TIBC/Ferritin/ %Sat No results found for: IRON, TIBC, FERRITIN, IRONPCTSAT    RADIOGRAPHIC STUDIES: I have personally reviewed the radiological images as listed and agreed with the findings in the report. CT HEAD WO CONTRAST (5MM)  Result Date: 02/05/2021 CLINICAL DATA:  Fall.  Head trauma, moderate-severe EXAM: CT HEAD WITHOUT CONTRAST TECHNIQUE: Contiguous axial images were obtained from the base of the skull through the vertex without intravenous contrast. COMPARISON:  None. FINDINGS: Brain: There is atrophy and chronic small vessel disease changes. No acute intracranial abnormality. Specifically, no hemorrhage, hydrocephalus, mass lesion, acute infarction, or significant intracranial injury. Vascular: No hyperdense vessel or unexpected calcification. Skull: No acute calvarial abnormality. Postoperative changes in the frontal regions  bilaterally. Sinuses/Orbits: No acute findings Other: None IMPRESSION: Atrophy, chronic microvascular disease. No acute intracranial abnormality. Electronically Signed   By: Rolm Baptise M.D.   On: 02/05/2021 18:22   CT SOFT TISSUE NECK W CONTRAST  Result Date: 03/21/2021 CLINICAL DATA:  [place] pharyngeal cancer. Assess response to treatment. 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 EXAM: CT NECK WITH CONTRAST TECHNIQUE: Multidetector CT imaging of the neck was performed using the standard protocol following the bolus administration of intravenous contrast. RADIATION DOSE REDUCTION: This exam was performed according to the departmental dose-optimization program which includes automated exposure control, adjustment of the mA and/or kV according to patient size and/or use of iterative reconstruction technique. CONTRAST:  165m OMNIPAQUE IOHEXOL 300 MG/ML  SOLN COMPARISON:  CT cervical spine 12/12/2020. MRI cervical spine 03/06/2021 FINDINGS: Pharynx and larynx: Post radiation changes in the larynx and epiglottis with edema. There is retropharyngeal edema similar to the prior study. Salivary glands: Post radiation change with hyperenhancement of the parotid and submandibular gland bilaterally. No mass lesion. Thyroid: Negative Lymph nodes: Retropharyngeal lymph node on the right is slightly smaller measuring 15 x 20 mm with central necrosis. Treated lymph node mass around the right carotid artery on the right appears stable. 8 mm lymph node posterior to the sternocleidomastoid muscle in the upper neck is stable. See axial image 35. No pathologic lymph nodes in the left neck. Vascular: Severe atherosclerotic stenosis right carotid bifurcation. Right internal carotid artery is small but patent. Moderate atherosclerotic stenosis left internal carotid artery. Chronic occlusion right jugular vein. Left jugular vein patent. Limited intracranial: No enhancing lesion in the brain.  Visualized orbits:  Negative Mastoids and visualized paranasal sinuses: Minimal mucosal edema paranasal sinuses. Mastoid clear bilaterally. Skeleton: Destructive mass lesion in the right skull base lateral to the clivus has developed since the prior CT. In retrospect this is seen on the prior MRI. No significant intracranial extension of tumor. Destructive mass in the right lateral C3 vertebral body appears progressive. There is destruction of the right C3 pedicle and progressive destruction of the lamina on the right. There is tumor filling the C3-4 foramen on the right. Progressive epidural tumor in the canal on the right without cord compression. Destructive mass lesion on the right at T2 is unchanged and appears to be a treated metastatic deposit. Associated levoscoliosis unchanged. Upper chest: Chest CT from today reported separately Other: None IMPRESSION: 1. Progressive metastatic disease in the right skull base involving the right occipital bone. No intracranial extension. 2. Progression of metastatic disease to the right lateral mass of C3 with progressive bone destruction and progressive epidural tumor in the canal. No spinal stenosis. There is tumor filling the right C3-4 foramen. 3. Stable treated metastatic disease right T2 vertebral body 4. Necrotic retropharyngeal node on the right slightly smaller. Stable lymph nodes in the right neck. 5. Post radiation changes in the neck as above. 6. Severe stenosis right internal carotid artery due to atherosclerotic calcification with string sign. Moderate stenosis left internal carotid artery. Electronically Signed   By: Franchot Gallo M.D.   On: 03/21/2021 15:43   CT Cervical Spine Wo Contrast  Result Date: 02/05/2021 CLINICAL DATA:  Fall. Neck trauma. History of or pharyngeal carcinoma and left upper lobe squamous cell lung carcinoma. EXAM: CT CERVICAL SPINE WITHOUT CONTRAST TECHNIQUE: Multidetector CT imaging of the cervical spine was performed without intravenous contrast.  Multiplanar CT image reconstructions were also generated. COMPARISON:  Neck CT 12/11/2020 FINDINGS: Alignment: No subluxation. Skull base and vertebrae: Destructive lesions in the right side of the C4 vertebral body and posterior elements and T2 vertebral body are again noted. T2 is stable. Progressive destruction noted in the right side of C4 compatible with metastatic disease. No fracture. Soft tissues and spinal canal: No prevertebral fluid or swelling. No visible canal hematoma. Disc levels: Advanced diffuse degenerative disc disease and facet disease bilaterally. Upper chest: Cystic area/lesion in the left upper lobe measures 14 mm, stable. Other: No acute findings IMPRESSION: Stable destructive process in the right side of the T2 vertebral body. Progressive destructive process in the right side and posterior elements of C4. Advanced diffuse degenerative disc and facet disease. No fracture. Electronically Signed   By: Rolm Baptise M.D.   On: 02/05/2021 18:21   MR Cervical Spine W Wo Contrast  Result Date: 03/06/2021 CLINICAL DATA:  Head and neck cancer, C spine lesion EXAM: MRI CERVICAL SPINE WITHOUT AND WITH CONTRAST TECHNIQUE: Multiplanar and multiecho pulse sequences of the cervical spine, to include the craniocervical junction and cervicothoracic junction, were obtained without and with intravenous contrast. CONTRAST:  73m GADAVIST GADOBUTROL 1 MMOL/ML IV SOLN COMPARISON:  Prior CT imaging FINDINGS: Alignment: Minor degenerative listhesis. Vertebrae: Abnormal marrow signal and enhancement at C4 with involvement of the right posterior elements. Abnormal marrow signal at the inferior C3 endplate may reflect additional disease or may be on a degenerative basis. Abnormal signal at the right aspect of the T2 vertebral body with pathologic fracture. Multilevel degenerative endplate irregularity. Cord: No abnormal signal.  No abnormal intrathecal enhancement. Posterior Fossa, vertebral arteries, paraspinal  tissues: Right vertebral artery is surrounded by abnormal  soft tissue at the C4 level but the flow void remains preserved. Mild prevertebral edema. Right retropharyngeal cystic/necrotic lesion measures similar to prior CT (series 5, image 4). Left cerebellar developmental venous anomaly. Disc levels: C2-C3:  Fusion of facets.  No significant stenosis. C3-C4: Disc bulge with endplate osteophytes. Uncovertebral and facet hypertrophy. Minor right ventral, dorsal, and lateral epidural extension of above described disease without canal stenosis. Above described disease involves the right neural foramen. Mild left foraminal degenerative stenosis. C4-C5: Disc bulge with endplate osteophytes. Uncovertebral and facet hypertrophy. Above described disease encroaches on the right neural foramen. There is no significant canal or left foraminal stenosis. C5-C6: Disc bulge with endplate osteophytes. Uncovertebral and facet hypertrophy. Effacement of the ventral subarachnoid space without significant canal stenosis. Mild right foraminal stenosis. No left foraminal stenosis. C6-C7: Disc bulge with endplate osteophytes. Uncovertebral and facet hypertrophy. Partial effacement of the ventral subarachnoid space without significant canal stenosis. Minor right foraminal stenosis. No left foraminal stenosis. C7-T1: Disc bulge with endplate osteophytes. Facet hypertrophy. No canal or right foraminal stenosis. Minor left foraminal stenosis. IMPRESSION: Destructive metastatic lesion at C4 involving the right posterior elements. There is mild epidural disease without canal stenosis. Involvement of right C3-C4 and C4-C5 foramina. Abnormal signal at the inferior C3 endplate is probably on a degenerative basis, less likely additional metastasis. Destructive metastatic lesion at T2 with pathologic fracture. Probably no substantial progression from December 2022 cervical spine CT. Electronically Signed   By: Macy Mis M.D.   On: 03/06/2021  12:14   CT CHEST ABDOMEN PELVIS W CONTRAST  Result Date: 03/21/2021 CLINICAL DATA:  Left upper lobe squamous cell carcinoma, metastatic oropharyngeal carcinoma restaging EXAM: CT CHEST, ABDOMEN, AND PELVIS WITH CONTRAST TECHNIQUE: Multidetector CT imaging of the chest, abdomen and pelvis was performed following the standard protocol during bolus administration of intravenous contrast. RADIATION DOSE REDUCTION: This exam was performed according to the departmental dose-optimization program which includes automated exposure control, adjustment of the mA and/or kV according to patient size and/or use of iterative reconstruction technique. CONTRAST:  181m OMNIPAQUE IOHEXOL 300 MG/ML SOLN additional oral enteric contrast COMPARISON:  12/11/2020 FINDINGS: CT CHEST FINDINGS Cardiovascular: Right chest port catheter. Aortic atherosclerosis. Normal heart size. Extensive three-vessel coronary artery calcifications. No pericardial effusion. Mediastinum/Nodes: No enlarged mediastinal, hilar, or axillary lymph nodes. Thyroid gland, trachea, and esophagus demonstrate no significant findings. Lungs/Pleura: Continued slight interval decrease in size of a subpleural nodule of the peripheral posterior left upper lobe, measuring 2.2 x 2.2 cm, previously 2.7 x 2.3 cm, with adjacent ground-glass and retraction (series 5, image 65). Interval increase in size of a cystic lesion of the paramedian left upper lobe, measuring 1.6 x 1.4 cm with new wall thickening, previously 1.1 x 1.1 cm (series 5, image 26). Interval increase in size of a multiple additional subpleural cystic lesions of the anterior left upper lobe, measuring 1.5 x 1.1 cm, previously 1.1 x 0.7 cm, however this remains thin walled (series 5, image 65), as well as similar lesions of the bilateral lower lobes (series 5, image 115). Interval increase in size of a subpleural nodule of the medial anterior right upper lobe, measuring 1.2 x 0.8 cm, previously 0.7 x 0.5 cm  (series 5, image 76). There is additionally new irregular ground-glass and consolidation in the dependent right upper lobe (series 5, image 50), largest nodular component in this vicinity measuring 1.1 x 0.9 cm (series 5, image 57). New scattered centrilobular nodularity and ground-glass in the lateral segment right middle lobe (  series 5, image 102), with a discrete nodule in this vicinity measuring 0.5 x 0.4 cm (series 5, image 115). Mild predominantly paraseptal emphysema. No pleural effusion or pneumothorax. Musculoskeletal: No chest wall mass or suspicious osseous lesions identified. CT ABDOMEN PELVIS FINDINGS Hepatobiliary: No solid liver abnormality is seen. No gallstones, gallbladder wall thickening, or biliary dilatation. Pancreas: Unremarkable. No pancreatic ductal dilatation or surrounding inflammatory changes. Spleen: Normal in size without significant abnormality. Adrenals/Urinary Tract: Adrenal glands are unremarkable. Kidneys are normal, without renal calculi, solid lesion, or hydronephrosis. Bladder is unremarkable. Stomach/Bowel: Stomach is within normal limits. Appendix appears normal. No evidence of bowel wall thickening, distention, or inflammatory changes. Pancolonic diverticulosis. Vascular/Lymphatic: No significant vascular findings are present. No enlarged abdominal or pelvic lymph nodes. Reproductive: Prostate brachytherapy. Other: No abdominal wall hernia or abnormality. No ascites. Musculoskeletal: No acute osseous findings. IMPRESSION: 1. Continued slight interval decrease in size of a subpleural nodule of the peripheral posterior left upper lobe, with adjacent ground-glass and retraction, consistent with evolving radiation pneumonitis and fibrosis about a treated nodule. 2. There are however multiple bilateral cystic pulmonary lesions, which are increased in size, at least one of which with significant wall thickening, although unusual in appearance highly concerning for squamous cell  metastases given enlargement and wall thickening. 3. Interval enlargement of a solid pulmonary nodule of the right upper lobe, consistent with an enlarging metastasis. 4. There is additionally new irregular ground-glass and consolidation in the dependent right upper lobe, as well as in the lateral segment right middle lobe, configuration and distribution of these findings most suggestive of nonspecific infection or aspiration, however an additional manifestation of malignancy is difficult to exclude. Attention on follow-up. 5. Emphysema. 6. Coronary artery disease. Aortic Atherosclerosis (ICD10-I70.0). Electronically Signed   By: Delanna Ahmadi M.D.   On: 03/21/2021 13:51   DG Chest Portable 1 View  Result Date: 01/11/2021 CLINICAL DATA:  Weakness.  Stage IV cancer. EXAM: PORTABLE CHEST 1 VIEW COMPARISON:  06/29/2019.  CT 12/11/2020. FINDINGS: Power port on the right with tip in the proximal right atrium. Focal density in the left lateral chest related to previous mass with subsequent radiation change. See results of CT scan 12/11/2020. Question mild patchy infiltrate in the right lower lobe. Bilateral nipple shadows and bilateral pleural calcifications remain visible. IMPRESSION: Focal density in the left lateral midlung related to tumor with previous radiation treatment change. Question mild patchy pneumonia at the right lung base. Bilateral nipple shadows and pleural calcifications as seen previously. Electronically Signed   By: Nelson Chimes M.D.   On: 01/11/2021 11:36    ASSESSMENT & PLAN:  1. 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 Weight loss   5. 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 -02/2021 neurosurgery recommended no intervention -January 2023 CT showed progression in lung lesions. Labs reviewed and discussed with patient. Patient tolerates carboplatin AUC 4+ Keytruda.  #Hypomagnesia, continue oral Slow-Mag, continue 1 tablet twice daily.   #Left upper lobe squamous cell carcinoma, stage I Finished SBRT on 02/07/2020. -See above  #Right jugular vein thrombus, with his cancer progression, continue Eliquis 2.5 mg twice daily for prophylaxis..Marland Kitchen  #Weight loss.  Continue follow-up with nutritionist.  Patient and wife prefer to have 1 L of normal saline for hydration.  We will proceed.  All questions were answered. The patient knows to call the clinic with any problems questions or concerns.   Return of visit: Lab MD carboplatin/Keytruda in 2 weeks   ZEarlie Server MD, PhD 04/04/2021

## 2021-04-06 ENCOUNTER — Ambulatory Visit: Payer: Medicare Other | Admitting: Oncology

## 2021-04-06 ENCOUNTER — Ambulatory Visit: Payer: Medicare Other

## 2021-04-08 ENCOUNTER — Other Ambulatory Visit: Payer: Self-pay | Admitting: Oncology

## 2021-04-09 NOTE — Telephone Encounter (Signed)
Notes           Component Ref Range & Units 2 mo ago (02/05/21) 2 mo ago (01/11/21) 2 mo ago (01/11/21) 5 mo ago (10/25/20) 5 mo ago (10/13/20) 6 mo ago (10/04/20) 7 mo ago (09/06/20)  Magnesium 1.7 - 2.4 mg/dL 2.0  4.2 High  CM  1.1 Low  CM  1.7 CM  1.7 CM  1.6 Low  CM  1.8 CM

## 2021-04-12 ENCOUNTER — Encounter: Payer: Self-pay | Admitting: Oncology

## 2021-04-17 ENCOUNTER — Encounter: Payer: Self-pay | Admitting: Oncology

## 2021-04-17 ENCOUNTER — Inpatient Hospital Stay (HOSPITAL_BASED_OUTPATIENT_CLINIC_OR_DEPARTMENT_OTHER): Payer: Medicare Other | Admitting: Oncology

## 2021-04-17 ENCOUNTER — Inpatient Hospital Stay: Payer: Medicare Other

## 2021-04-17 ENCOUNTER — Other Ambulatory Visit: Payer: Self-pay

## 2021-04-17 VITALS — BP 131/73 | HR 66 | Temp 97.1°F | Wt 100.4 lb

## 2021-04-17 DIAGNOSIS — R634 Abnormal weight loss: Secondary | ICD-10-CM | POA: Diagnosis not present

## 2021-04-17 DIAGNOSIS — C329 Malignant neoplasm of larynx, unspecified: Secondary | ICD-10-CM

## 2021-04-17 DIAGNOSIS — C109 Malignant neoplasm of oropharynx, unspecified: Secondary | ICD-10-CM | POA: Diagnosis not present

## 2021-04-17 DIAGNOSIS — C3492 Malignant neoplasm of unspecified part of left bronchus or lung: Secondary | ICD-10-CM

## 2021-04-17 DIAGNOSIS — Z5111 Encounter for antineoplastic chemotherapy: Secondary | ICD-10-CM | POA: Diagnosis not present

## 2021-04-17 DIAGNOSIS — Z1329 Encounter for screening for other suspected endocrine disorder: Secondary | ICD-10-CM

## 2021-04-17 LAB — CBC WITH DIFFERENTIAL/PLATELET
Abs Immature Granulocytes: 0.02 10*3/uL (ref 0.00–0.07)
Basophils Absolute: 0 10*3/uL (ref 0.0–0.1)
Basophils Relative: 0 %
Eosinophils Absolute: 0.1 10*3/uL (ref 0.0–0.5)
Eosinophils Relative: 1 %
HCT: 33 % — ABNORMAL LOW (ref 39.0–52.0)
Hemoglobin: 10.6 g/dL — ABNORMAL LOW (ref 13.0–17.0)
Immature Granulocytes: 0 %
Lymphocytes Relative: 13 %
Lymphs Abs: 0.6 10*3/uL — ABNORMAL LOW (ref 0.7–4.0)
MCH: 29.8 pg (ref 26.0–34.0)
MCHC: 32.1 g/dL (ref 30.0–36.0)
MCV: 92.7 fL (ref 80.0–100.0)
Monocytes Absolute: 0.6 10*3/uL (ref 0.1–1.0)
Monocytes Relative: 12 %
Neutro Abs: 3.8 10*3/uL (ref 1.7–7.7)
Neutrophils Relative %: 74 %
Platelets: 165 10*3/uL (ref 150–400)
RBC: 3.56 MIL/uL — ABNORMAL LOW (ref 4.22–5.81)
RDW: 14 % (ref 11.5–15.5)
WBC: 5.1 10*3/uL (ref 4.0–10.5)
nRBC: 0 % (ref 0.0–0.2)

## 2021-04-17 LAB — COMPREHENSIVE METABOLIC PANEL
ALT: 16 U/L (ref 0–44)
AST: 20 U/L (ref 15–41)
Albumin: 3.5 g/dL (ref 3.5–5.0)
Alkaline Phosphatase: 69 U/L (ref 38–126)
Anion gap: 6 (ref 5–15)
BUN: 17 mg/dL (ref 8–23)
CO2: 27 mmol/L (ref 22–32)
Calcium: 9.1 mg/dL (ref 8.9–10.3)
Chloride: 104 mmol/L (ref 98–111)
Creatinine, Ser: 0.74 mg/dL (ref 0.61–1.24)
GFR, Estimated: >60 mL/min (ref >60)
Glucose, Bld: 108 mg/dL — ABNORMAL HIGH (ref 70–99)
Potassium: 3.9 mmol/L (ref 3.5–5.1)
Sodium: 137 mmol/L (ref 135–145)
Total Bilirubin: 0.4 mg/dL (ref 0.3–1.2)
Total Protein: 6.7 g/dL (ref 6.5–8.1)

## 2021-04-17 LAB — TSH: TSH: 3.332 u[IU]/mL (ref 0.350–4.500)

## 2021-04-17 LAB — T4, FREE: Free T4: 0.96 ng/dL (ref 0.61–1.12)

## 2021-04-17 MED ORDER — SODIUM CHLORIDE 0.9 % IV SOLN
150.0000 mg | Freq: Once | INTRAVENOUS | Status: AC
  Administered 2021-04-17: 150 mg via INTRAVENOUS
  Filled 2021-04-17: qty 150

## 2021-04-17 MED ORDER — SODIUM CHLORIDE 0.9 % IV SOLN
200.0000 mg | Freq: Once | INTRAVENOUS | Status: AC
  Administered 2021-04-17: 200 mg via INTRAVENOUS
  Filled 2021-04-17: qty 200

## 2021-04-17 MED ORDER — SODIUM CHLORIDE 0.9 % IV SOLN
289.3500 mg | Freq: Once | INTRAVENOUS | Status: AC
  Administered 2021-04-17: 290 mg via INTRAVENOUS
  Filled 2021-04-17: qty 29

## 2021-04-17 MED ORDER — PALONOSETRON HCL INJECTION 0.25 MG/5ML
0.2500 mg | Freq: Once | INTRAVENOUS | Status: AC
  Administered 2021-04-17: 0.25 mg via INTRAVENOUS
  Filled 2021-04-17: qty 5

## 2021-04-17 MED ORDER — SODIUM CHLORIDE 0.9 % IV SOLN
10.0000 mg | Freq: Once | INTRAVENOUS | Status: AC
  Administered 2021-04-17: 10 mg via INTRAVENOUS
  Filled 2021-04-17: qty 10

## 2021-04-17 MED ORDER — SODIUM CHLORIDE 0.9 % IV SOLN
Freq: Once | INTRAVENOUS | Status: AC
  Filled 2021-04-17: qty 250

## 2021-04-17 MED ORDER — DIPHENHYDRAMINE HCL 50 MG/ML IJ SOLN
50.0000 mg | Freq: Once | INTRAMUSCULAR | Status: AC
  Administered 2021-04-17: 50 mg via INTRAVENOUS
  Filled 2021-04-17: qty 1

## 2021-04-17 MED ORDER — HEPARIN SOD (PORK) LOCK FLUSH 100 UNIT/ML IV SOLN
500.0000 [IU] | Freq: Once | INTRAVENOUS | Status: AC | PRN
  Administered 2021-04-17: 500 [IU]
  Filled 2021-04-17: qty 5

## 2021-04-17 NOTE — Progress Notes (Signed)
Patient for follow up. Voices no concerns

## 2021-04-17 NOTE — Progress Notes (Signed)
Hematology/Oncology Progress note [phone] [phone]      Patient Care Team: Casilda Carls, MD as PCP - General (Internal Medicine) Earlie Server, [name], [name]  HISTORY OF PRESENTING ILLNESS:  History of prostate cancer. 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  PD-L1-IHC 40%, no targetable mutations. # 09/07/2019 chemotherapy [cisplatin] and radiation.   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  Interval decrease of the posterior left upper lobe pleural-based nodule with interval decrease in size of the tiny adjacent satellite nodule.  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  No recurrent tumor in right piriform sinus.  Right lateral pharyngeal lymph node is stable.  No new or recurrent adenopathy.Suspect metastasis.  I recommend patient to obtain PET scan restaging.  I discussed about the plan of re-biopsy of either the paraspinal soft tissue mass versus other more feasible hypermetabolic sites detected on PET scan. Patient was seen by Dr. Kennyth Lose and opted to proceed with palliative radiation as soon as possible.  PET scan cannot be arranged prior to the radiation so the PET scan was canceled. 04/20/2020 -04/25/2020, palliative radiation to thoracic spine  05/10/2020, MRI thoracic spine was obtained for  worsening of back pain and left shoulder pain.  Images showed metastatic disease throughout almost the entire T2 with and associated mild superior endplate compression fracture.  Abnormal signal in the anterior, inferior endplate of T1, superior aspect of the T3.  Small metastatic deposit in T12 is also noted. Case was also discussed on multidisciplinary tumor board on 05/25/2020 05/22/2020, PET scan showed new hypermetabolic lytic bone metastasis in the left scapular near the glenoid and in the T2 vertebral body.  Persistent hypermetabolic right lateral retropharyngeal and right level 3 neck lymph node metastasis.  Peripheral left upper lobe pulmonary nodule is mildly decreased with new surrounding mild platelike postradiation changes..  No residual recurrent neoplasm at the site of vomiting in the right piriform sinus  06/07/2020 -06/09/2020 patient underwent palliative radiation to left scapular April 2022 seen by [name] and flexible laryngoscope examination showed new lesion at the right false vocal cord.  Case was discussed on tumor board on 06/22/2020. [name] recommend additional CT soft tissue neck with contrast for additional evaluation. 06/29/2020 CT images were reviewed and discussed with patient. There is new malignant lymph node in the right mid neck at the level of the hyoid, this compressing the right jugular vein with thrombus in the vein above and below the lymph node.  Cystic retropharyngeal lymph node on the right is stable.  Progressive soft tissue thickening in the larynx bilaterally suspicious for tumor.  Metastatic disease at the T2 is unchanged.  Metastatic deposit left scapula there is now a pathological fracture of the scapular. CT scan findings were discussed with ENT [name] via secure chat.  [name] feels surgery is not feasible.  The larynx lesion currently does not  compromise his airway however further progression may in the future  07/10/2020 Seen by Radonc  [name] who would offer salvage radiation to his larynx as well as neck adenopathy. Would like low dose chemotherapy as sensitizer.    Patient was also referred to establish care with Dr. Georgiann Cocker at [facility] for kyphoplasty.  Patient's wife reports that they were never contacted and she called Dr. Georgiann Cocker office with no success.  # NGS.  PD-L1-IHC 40%, no targetable mutations # 07/25/20- 07/27/2020, 08/01/20- 09/06/2020  concurrent chemoradiation [low-dose weekly cisplatin $RemoveBeforeDE'30mg'GwLrjckFetlEqxj$ /m2 ] to the larynx  09/15/2020, patient was seen by ENT [name].  Laryngoscope examination reviewed clear larynx, no lesion was discovered. 10/04/2020, started on maintenance Keytruda. 12/11/2020 restaging CT 1. Progression of Disease: - new since May right C3 destructive osseous metastasis has eroded the right pedicle and facet, with tumor filling the right C4 neural foramen and engulfing the right vertebral artery which remains patent. Mild right lateral epidural tumor. - small (8 mm short axis) but progressed right level 2A nodal disease with evidence of extracapsular extension. 2. 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  3. No recurrent laryngeal or pharyngeal tumor identified. 4. 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 CT Chest, Abdomen, and Pelvis the same day are reported separately.  01/16/2021, patient underwent additional palliative radiation to C-spine. 02/05/2021 Patient had a fall and went to emergency room on .   CT cervical spine without contrast showed stable destructive process in the right side of the T3 vertebral body.  Progressive destructive process in the right side and the posterior element of C4.  No fracture.  03/06/2021, MRI cervical spine with and without contrast showed destructive metastatic lesion at C4 involving the right posterior  elements.  There is mild epidural disease without canal stenosis.  Involvement of right C3-C4 and C4-C5 foraminal.  Abnormal signal at the inferior C3 endplate is probably on degenerative basis.  Destructive metastatic lesion at T2 with pathological fracture. 03/09/2021 patient was seen by neurosurgeon Dr. Cari Caraway.  Based on his current disease burden, dementia, lack of obvious unstable lesion, surgical fixation was not recommended.  Imaging surveillance was recommended and Dr. Cari Caraway is happy to review his imaging over time.  If he has worsening tumor burden, could consider vertebroplasty at T2 or at C4. 03/20/2021, CT chest abdomen pelvis with contrast showed Left upper lobe subpleural nodule[previously treated with radiation] showed interval decrease, with evolving radiation pneumonitis/fibrosis. Multiple bilateral cystic pulmonary lesions, increased in size, at least 1 of which with significant wall thickening.  Interval enlargement of a solid nodule of the right upper lobe, consistent with an enlarging metastasis.  New irregular groundglass and consolidation in the dependent right upper lobe, lateral segment right middle lobe, suggesting nonspecific infection or aspiration.  An additional manifestation of malignancy is difficult to exclude.  Emphysema.  CAD.  INTERVAL HISTORY Gregory Burton is a 81 y.o. male who has above history reviewed by me today presents for follow-up of head and neck cancer and lung cancer  Status post 1 cycle of carboplatin AUC 4/Keytruda.  Patient reports feeling well at baseline.  Accompanied by his wife.  Appetite is fair.  Review of Systems  Constitutional:  Positive for fatigue. Negative for appetite change, chills, fever and unexpected weight change.  HENT:   Positive for hearing loss.   Eyes:  Negative for eye problems and icterus.  Respiratory:  Negative for chest tightness, cough and shortness of breath.   Cardiovascular:  Negative for chest pain and leg  swelling.  Gastrointestinal:  Negative for abdominal distention, abdominal pain, blood in stool and nausea.  Endocrine: Negative for hot flashes.  Genitourinary:  Negative for difficulty urinating, dysuria and frequency.   Musculoskeletal:  Positive for arthralgias.  Skin:  Negative for itching and rash.  Neurological:  Negative for extremity weakness, light-headedness and numbness.  Hematological:  Negative for adenopathy. Does not bruise/bleed easily.  Psychiatric/Behavioral:  Negative for confusion.        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   Prostate CA ([place]) 2008   Squamous cell lung cancer ([place]) 06/23/2019    SURGICAL HISTORY: Past Surgical History:  Procedure Laterality Date   BRAIN SURGERY  2012   HERNIA REPAIR     PORTA CATH INSERTION N/A 07/27/2019   Procedure: PORTA CATH INSERTION;  Surgeon: Katha Cabal, MD;  Location: [facility];  Service: Cardiovascular;  Laterality: N/A;    SOCIAL HISTORY: Social History   Socioeconomic History   Marital status: Married    Spouse name: Not on file   Number of children: Not on file   Years of education: Not on file   Highest education level: Not on file  Occupational History   Not on file  Tobacco Use   Smoking status: Former    Years: 21.00    Types: Cigarettes    Quit date: 05/11/2008    Years since quitting: 12.9   Smokeless tobacco: Never  Vaping Use   Vaping Use: Never used  Substance and Sexual Activity   Alcohol use: Yes    Comment: 0.5 pint liquor a week   Drug use: Never   Sexual activity: Not Currently  Other Topics Concern   Not on file  Social History Narrative   Lives at home with wife. Wife states he has some dementia but is  able to sign his own consent.   Social Determinants of Health   Financial Resource Strain: Not on file  Food Insecurity: Not on file  Transportation Needs: Not on file  Physical Activity: Not on file  Stress: Not on file  Social Connections: Not on file  Intimate Partner Violence: Not on file    FAMILY HISTORY: History reviewed. No pertinent family history.  ALLERGIES:  is allergic to ace inhibitors and enalapril.  MEDICATIONS:  Current Outpatient Medications  Medication Sig Dispense Refill   donepezil (ARICEPT) 10 MG tablet Take 10 mg by mouth at bedtime.     dorzolamide-timolol (COSOPT) 22.3-6.8 MG/ML ophthalmic solution Place 1 drop into both eyes 2 (two) times daily.      ELIQUIS 2.5 MG TABS tablet TAKE 1 TABLET BY MOUTH TWICE A DAY 60 tablet 1   folic acid (FOLVITE) 1 MG tablet Take 1 mg by mouth daily.     latanoprost (XALATAN) 0.005 % ophthalmic solution Place 1 drop into both eyes at bedtime.      lidocaine-prilocaine (EMLA) cream Apply 1 application topically as needed. Apply small amount to port site approx 1-2 hours prior to appointment. 30 g 1   MAG64 64  MG TBEC TAKE 1 TABLET (64 MG TOTAL) BY MOUTH 2 (TWO) TIMES DAILY. 180 tablet 0   Multiple Vitamins-Minerals (CENTRUM ADULTS PO) Take by mouth.     nystatin (MYCOSTATIN) 100000 UNIT/ML suspension Take 5 mLs (500,000 Units total) by mouth 4 (four) times daily. 480 mL 1   prochlorperazine (COMPAZINE) 10 MG tablet Take 1 tablet (10 mg total) by mouth every 8 (eight) hours as needed for nausea or vomiting. 60 tablet 0   Thiamine HCl (VITAMIN B-1 PO) Take 100 mg by mouth daily.     atorvastatin (LIPITOR) 20 MG tablet Take 20 mg by mouth daily. (Patient not taking: Reported on 04/04/2021)     colchicine 0.6 MG tablet Take 0.6 mg by mouth daily. SMARTSIG:1 Tablet(s) By Mouth Daily (Patient not taking: Reported on 02/06/2021)     oxyCODONE (OXY IR/ROXICODONE) 5 MG immediate release tablet Take 1 tablet (5 mg total) by mouth every 4  (four) hours as needed for severe pain. (Patient not taking: Reported on 04/04/2021) 90 tablet 0   No current facility-administered medications for this visit.   Facility-Administered Medications Ordered in Other Visits  Medication Dose Route Frequency Provider Last Rate Last Admin   CARBOplatin (PARAPLATIN) 290 mg in sodium chloride 0.9 % 250 mL chemo infusion  290 mg Intravenous Once Earlie Server, MD       dexamethasone (DECADRON) 10 mg in sodium chloride 0.9 % 50 mL IVPB  10 mg Intravenous Once Earlie Server, MD 204 mL/hr at 04/17/21 0928 10 mg at 04/17/21 1610   fosaprepitant (EMEND) 150 mg in sodium chloride 0.9 % 145 mL IVPB  150 mg Intravenous Once Earlie Server, MD 450 mL/hr at 04/17/21 0926 150 mg at 04/17/21 0926   pembrolizumab (KEYTRUDA) 200 mg in sodium chloride 0.9 % 50 mL chemo infusion  200 mg Intravenous Once Earlie Server, MD       sodium chloride flush (NS) 0.9 % injection 10 mL  10 mL Intravenous PRN Earlie Server, MD   10 mL at 03/01/20 9604     PHYSICAL EXAMINATION: ECOG PERFORMANCE STATUS: 1 - Symptomatic but completely ambulatory Vitals:   04/17/21 0833  BP: 131/73  Pulse: 66  Temp: (!) 97.1 F (36.2 C)   Filed Weights   04/17/21 0833  Weight: 100 lb 6.4 oz (45.5 kg)    Physical Exam Constitutional:      General: He is not in acute distress. HENT:     Head: Normocephalic and atraumatic.  Eyes:     General: No scleral icterus. Cardiovascular:     Rate and Rhythm: Normal rate and regular rhythm.     Heart sounds: Normal heart sounds.  Pulmonary:     Effort: Pulmonary effort is normal. No respiratory distress.     Breath sounds: No wheezing.  Abdominal:     General: Bowel sounds are normal. There is no distension.     Palpations: Abdomen is soft.  Musculoskeletal:        General: No deformity.     Cervical back: Normal range of motion and neck supple.  Lymphadenopathy:     Cervical: No cervical adenopathy.  Skin:    General: Skin is warm and dry.     Findings: No  erythema or rash.  Neurological:     Mental Status: He is alert. Mental status is at baseline.     Cranial Nerves: No cranial nerve deficit.     Coordination: Coordination normal.    LABORATORY DATA:  I have reviewed the  data as listed Lab Results  Component Value Date   WBC 5.1 04/17/2021   HGB 10.6 (L) 04/17/2021   HCT 33.0 (L) 04/17/2021   MCV 92.7 04/17/2021   PLT 165 04/17/2021   Recent Labs    01/11/21 0918 01/11/21 1351 03/27/21 0829 04/04/21 0833 04/17/21 0747  NA SPECIMEN CONTAMINATED, UNABLE TO PERFORM TEST(S).   < > 138 139 137  K SPECIMEN CONTAMINATED, UNABLE TO PERFORM TEST(S).   < > 3.9 4.0 3.9  CL SPECIMEN CONTAMINATED, UNABLE TO PERFORM TEST(S).   < > 102 103 104  CO2 SPECIMEN CONTAMINATED, UNABLE TO PERFORM TEST(S).   < > $R'29 28 27  'ht$ GLUCOSE SPECIMEN CONTAMINATED, UNABLE TO PERFORM TEST(S).   < > 114* 110* 108*  BUN SPECIMEN CONTAMINATED, UNABLE TO PERFORM TEST(S).   < > $R'18 20 17  'ni$ CREATININE SPECIMEN CONTAMINATED, UNABLE TO PERFORM TEST(S).   < > 0.77 0.68 0.74  CALCIUM SPECIMEN CONTAMINATED, UNABLE TO PERFORM TEST(S).   < > 9.4 9.4 9.1  GFRNONAA SPECIMEN CONTAMINATED, UNABLE TO PERFORM TEST(S).   < > >60 >60 >60  PROT 3.8*   < > 7.3 7.2 6.7  ALBUMIN 2.0*   < > 3.6 3.6 3.5  AST 9*   < > $R'20 20 20  'CM$ ALT 8   < > $R'17 16 16  'Kr$ ALKPHOS 30*   < > 75 75 69  BILITOT 0.5   < > 0.5 0.4 0.4  BILIDIR <0.1  --   --   --   --   IBILI NOT CALCULATED  --   --   --   --    < > = values in this interval not displayed.    Iron/TIBC/Ferritin/ %Sat No results found for: IRON, TIBC, FERRITIN, IRONPCTSAT    RADIOGRAPHIC STUDIES: I have personally reviewed the radiological images as listed and agreed with the findings in the report. CT HEAD WO CONTRAST (5MM)  Result Date: 02/05/2021 CLINICAL DATA:  Fall.  Head trauma, moderate-severe EXAM: CT HEAD WITHOUT CONTRAST TECHNIQUE: Contiguous axial images were obtained from the base of the skull through the vertex without intravenous  contrast. COMPARISON:  None. FINDINGS: Brain: There is atrophy and chronic small vessel disease changes. No acute intracranial abnormality. Specifically, no hemorrhage, hydrocephalus, mass lesion, acute infarction, or significant intracranial injury. Vascular: No hyperdense vessel or unexpected calcification. Skull: No acute calvarial abnormality. Postoperative changes in the frontal regions bilaterally. Sinuses/Orbits: No acute findings Other: None IMPRESSION: Atrophy, chronic microvascular disease. No acute intracranial abnormality. Electronically Signed   By: Rolm Baptise M.D.   On: 02/05/2021 18:22   CT SOFT TISSUE NECK W CONTRAST  Result Date: 03/21/2021 CLINICAL DATA:  [place] pharyngeal cancer. Assess response to treatment. 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 EXAM: CT NECK WITH CONTRAST TECHNIQUE: Multidetector CT imaging of the neck was performed using the standard protocol following the bolus administration of intravenous contrast. RADIATION DOSE REDUCTION: This exam was performed according to the departmental dose-optimization program which includes automated exposure control, adjustment of the mA and/or kV according to patient size and/or use of iterative reconstruction technique. CONTRAST:  129mL OMNIPAQUE IOHEXOL 300 MG/ML  SOLN COMPARISON:  CT cervical spine 12/12/2020. MRI cervical spine 03/06/2021 FINDINGS: Pharynx and larynx: Post radiation changes in the larynx and epiglottis with edema. There is retropharyngeal edema similar to the prior study. Salivary glands: Post radiation change with hyperenhancement of the parotid and submandibular gland bilaterally. No mass lesion. Thyroid: Negative Lymph nodes: Retropharyngeal lymph  node on the right is slightly smaller measuring 15 x 20 mm with central necrosis. Treated lymph node mass around the right carotid artery on the right appears stable. 8 mm lymph node posterior to the sternocleidomastoid muscle in the upper neck is stable.  See axial image 35. No pathologic lymph nodes in the left neck. Vascular: Severe atherosclerotic stenosis right carotid bifurcation. Right internal carotid artery is small but patent. Moderate atherosclerotic stenosis left internal carotid artery. Chronic occlusion right jugular vein. Left jugular vein patent. Limited intracranial: No enhancing lesion in the brain. Visualized orbits: Negative Mastoids and visualized paranasal sinuses: Minimal mucosal edema paranasal sinuses. Mastoid clear bilaterally. Skeleton: Destructive mass lesion in the right skull base lateral to the clivus has developed since the prior CT. In retrospect this is seen on the prior MRI. No significant intracranial extension of tumor. Destructive mass in the right lateral C3 vertebral body appears progressive. There is destruction of the right C3 pedicle and progressive destruction of the lamina on the right. There is tumor filling the C3-4 foramen on the right. Progressive epidural tumor in the canal on the right without cord compression. Destructive mass lesion on the right at T2 is unchanged and appears to be a treated metastatic deposit. Associated levoscoliosis unchanged. Upper chest: Chest CT from today reported separately Other: None IMPRESSION: 1. Progressive metastatic disease in the right skull base involving the right occipital bone. No intracranial extension. 2. Progression of metastatic disease to the right lateral mass of C3 with progressive bone destruction and progressive epidural tumor in the canal. No spinal stenosis. There is tumor filling the right C3-4 foramen. 3. Stable treated metastatic disease right T2 vertebral body 4. Necrotic retropharyngeal node on the right slightly smaller. Stable lymph nodes in the right neck. 5. Post radiation changes in the neck as above. 6. Severe stenosis right internal carotid artery due to atherosclerotic calcification with string sign. Moderate stenosis left internal carotid artery.  Electronically Signed   By: Franchot Gallo M.D.   On: 03/21/2021 15:43   CT Cervical Spine Wo Contrast  Result Date: 02/05/2021 CLINICAL DATA:  Fall. Neck trauma. History of or pharyngeal carcinoma and left upper lobe squamous cell lung carcinoma. EXAM: CT CERVICAL SPINE WITHOUT CONTRAST TECHNIQUE: Multidetector CT imaging of the cervical spine was performed without intravenous contrast. Multiplanar CT image reconstructions were also generated. COMPARISON:  Neck CT 12/11/2020 FINDINGS: Alignment: No subluxation. Skull base and vertebrae: Destructive lesions in the right side of the C4 vertebral body and posterior elements and T2 vertebral body are again noted. T2 is stable. Progressive destruction noted in the right side of C4 compatible with metastatic disease. No fracture. Soft tissues and spinal canal: No prevertebral fluid or swelling. No visible canal hematoma. Disc levels: Advanced diffuse degenerative disc disease and facet disease bilaterally. Upper chest: Cystic area/lesion in the left upper lobe measures 14 mm, stable. Other: No acute findings IMPRESSION: Stable destructive process in the right side of the T2 vertebral body. Progressive destructive process in the right side and posterior elements of C4. Advanced diffuse degenerative disc and facet disease. No fracture. Electronically Signed   By: Rolm Baptise M.D.   On: 02/05/2021 18:21   MR Cervical Spine W Wo Contrast  Result Date: 03/06/2021 CLINICAL DATA:  Head and neck cancer, C spine lesion EXAM: MRI CERVICAL SPINE WITHOUT AND WITH CONTRAST TECHNIQUE: Multiplanar and multiecho pulse sequences of the cervical spine, to include the craniocervical junction and cervicothoracic junction, were obtained without and with intravenous  contrast. CONTRAST:  81mL GADAVIST GADOBUTROL 1 MMOL/ML IV SOLN COMPARISON:  Prior CT imaging FINDINGS: Alignment: Minor degenerative listhesis. Vertebrae: Abnormal marrow signal and enhancement at C4 with involvement of  the right posterior elements. Abnormal marrow signal at the inferior C3 endplate may reflect additional disease or may be on a degenerative basis. Abnormal signal at the right aspect of the T2 vertebral body with pathologic fracture. Multilevel degenerative endplate irregularity. Cord: No abnormal signal.  No abnormal intrathecal enhancement. Posterior Fossa, vertebral arteries, paraspinal tissues: Right vertebral artery is surrounded by abnormal soft tissue at the C4 level but the flow void remains preserved. Mild prevertebral edema. Right retropharyngeal cystic/necrotic lesion measures similar to prior CT (series 5, image 4). Left cerebellar developmental venous anomaly. Disc levels: C2-C3:  Fusion of facets.  No significant stenosis. C3-C4: Disc bulge with endplate osteophytes. Uncovertebral and facet hypertrophy. Minor right ventral, dorsal, and lateral epidural extension of above described disease without canal stenosis. Above described disease involves the right neural foramen. Mild left foraminal degenerative stenosis. C4-C5: Disc bulge with endplate osteophytes. Uncovertebral and facet hypertrophy. Above described disease encroaches on the right neural foramen. There is no significant canal or left foraminal stenosis. C5-C6: Disc bulge with endplate osteophytes. Uncovertebral and facet hypertrophy. Effacement of the ventral subarachnoid space without significant canal stenosis. Mild right foraminal stenosis. No left foraminal stenosis. C6-C7: Disc bulge with endplate osteophytes. Uncovertebral and facet hypertrophy. Partial effacement of the ventral subarachnoid space without significant canal stenosis. Minor right foraminal stenosis. No left foraminal stenosis. C7-T1: Disc bulge with endplate osteophytes. Facet hypertrophy. No canal or right foraminal stenosis. Minor left foraminal stenosis. IMPRESSION: Destructive metastatic lesion at C4 involving the right posterior elements. There is mild epidural  disease without canal stenosis. Involvement of right C3-C4 and C4-C5 foramina. Abnormal signal at the inferior C3 endplate is probably on a degenerative basis, less likely additional metastasis. Destructive metastatic lesion at T2 with pathologic fracture. Probably no substantial progression from December 2022 cervical spine CT. Electronically Signed   By: Macy Mis M.D.   On: 03/06/2021 12:14   CT CHEST ABDOMEN PELVIS W CONTRAST  Result Date: 03/21/2021 CLINICAL DATA:  Left upper lobe squamous cell carcinoma, metastatic oropharyngeal carcinoma restaging EXAM: CT CHEST, ABDOMEN, AND PELVIS WITH CONTRAST TECHNIQUE: Multidetector CT imaging of the chest, abdomen and pelvis was performed following the standard protocol during bolus administration of intravenous contrast. RADIATION DOSE REDUCTION: This exam was performed according to the departmental dose-optimization program which includes automated exposure control, adjustment of the mA and/or kV according to patient size and/or use of iterative reconstruction technique. CONTRAST:  181mL OMNIPAQUE IOHEXOL 300 MG/ML SOLN additional oral enteric contrast COMPARISON:  12/11/2020 FINDINGS: CT CHEST FINDINGS Cardiovascular: Right chest port catheter. Aortic atherosclerosis. Normal heart size. Extensive three-vessel coronary artery calcifications. No pericardial effusion. Mediastinum/Nodes: No enlarged mediastinal, hilar, or axillary lymph nodes. Thyroid gland, trachea, and esophagus demonstrate no significant findings. Lungs/Pleura: Continued slight interval decrease in size of a subpleural nodule of the peripheral posterior left upper lobe, measuring 2.2 x 2.2 cm, previously 2.7 x 2.3 cm, with adjacent ground-glass and retraction (series 5, image 65). Interval increase in size of a cystic lesion of the paramedian left upper lobe, measuring 1.6 x 1.4 cm with new wall thickening, previously 1.1 x 1.1 cm (series 5, image 26). Interval increase in size of a multiple  additional subpleural cystic lesions of the anterior left upper lobe, measuring 1.5 x 1.1 cm, previously 1.1 x 0.7 cm, however this remains  thin walled (series 5, image 65), as well as similar lesions of the bilateral lower lobes (series 5, image 115). Interval increase in size of a subpleural nodule of the medial anterior right upper lobe, measuring 1.2 x 0.8 cm, previously 0.7 x 0.5 cm (series 5, image 76). There is additionally new irregular ground-glass and consolidation in the dependent right upper lobe (series 5, image 50), largest nodular component in this vicinity measuring 1.1 x 0.9 cm (series 5, image 57). New scattered centrilobular nodularity and ground-glass in the lateral segment right middle lobe (series 5, image 102), with a discrete nodule in this vicinity measuring 0.5 x 0.4 cm (series 5, image 115). Mild predominantly paraseptal emphysema. No pleural effusion or pneumothorax. Musculoskeletal: No chest wall mass or suspicious osseous lesions identified. CT ABDOMEN PELVIS FINDINGS Hepatobiliary: No solid liver abnormality is seen. No gallstones, gallbladder wall thickening, or biliary dilatation. Pancreas: Unremarkable. No pancreatic ductal dilatation or surrounding inflammatory changes. Spleen: Normal in size without significant abnormality. Adrenals/Urinary Tract: Adrenal glands are unremarkable. Kidneys are normal, without renal calculi, solid lesion, or hydronephrosis. Bladder is unremarkable. Stomach/Bowel: Stomach is within normal limits. Appendix appears normal. No evidence of bowel wall thickening, distention, or inflammatory changes. Pancolonic diverticulosis. Vascular/Lymphatic: No significant vascular findings are present. No enlarged abdominal or pelvic lymph nodes. Reproductive: Prostate brachytherapy. Other: No abdominal wall hernia or abnormality. No ascites. Musculoskeletal: No acute osseous findings. IMPRESSION: 1. Continued slight interval decrease in size of a subpleural nodule of  the peripheral posterior left upper lobe, with adjacent ground-glass and retraction, consistent with evolving radiation pneumonitis and fibrosis about a treated nodule. 2. There are however multiple bilateral cystic pulmonary lesions, which are increased in size, at least one of which with significant wall thickening, although unusual in appearance highly concerning for squamous cell metastases given enlargement and wall thickening. 3. Interval enlargement of a solid pulmonary nodule of the right upper lobe, consistent with an enlarging metastasis. 4. There is additionally new irregular ground-glass and consolidation in the dependent right upper lobe, as well as in the lateral segment right middle lobe, configuration and distribution of these findings most suggestive of nonspecific infection or aspiration, however an additional manifestation of malignancy is difficult to exclude. Attention on follow-up. 5. Emphysema. 6. Coronary artery disease. Aortic Atherosclerosis (ICD10-I70.0). Electronically Signed   By: Delanna Ahmadi M.D.   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 Tongue base mass extending into right piriform sinus [hypopharynx] cT4 cN2b cM0- Stage IVB p16 negative, July 2021 status post chemoradiation March 2022  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 Labs reviewed and discussed with patient. Overall he tolerates treatment. Increase carboplatin to AUC 4.5, plus Keytruda today.   #Hypomagnesia, continue oral Slow-Mag, continue 1 tablet twice daily.   #Left upper lobe squamous cell carcinoma, stage I Finished SBRT on 02/07/2020.- size continues to decrease However there are multiple new lung lesions, likely metastatic lesion from primary Origin or head and neck origin.Marland Kitchen  #Right jugular vein thrombus, with his cancer progression, continue Eliquis 2.5 mg twice daily for prophylaxis.  #Weight loss.  Continue follow-up with nutritionist.  Weight has been stable.  All questions were answered. The patient knows to call the clinic with any problems questions or concerns.   Return of visit: Lab MD carboplatin/Keytruda in 3 weeks   Earlie Server, MD, PhD 04/17/2021

## 2021-04-17 NOTE — Progress Notes (Signed)
Nutrition Follow-up:  Patient with metastatic head and neck cancer.  Receiving keytruda and carboplatin.    Met with wife while patient in infusion.  Patient with dementia.  Wife reports that patient is still eating and appetite is about the same.  Does not complain about trouble swallowing or nausea.  Patient ate 2 eggs, toast with cheese, spam and drank ensure and tea this am for breakfast.  Lunch he likes ravioli and milkshake.  Dinner last night he ate meatloaf, green beans and potatoes.      Medications: reviewed  Labs: reviewed  Anthropometrics:   Weight 100 lb 6.4 oz today  101 lb 4.8 oz on 1/6 103 lb 8 oz on 11/2 107 lb on 10/12 107 lb on 9/21 104 lb on 8/10 104 lb on 7/13 107 lb on 6/8 109 lb on 6/1 112 lb on 4/1   NUTRITION DIAGNOSIS: Inadequate oral intake decreased   INTERVENTION:  Wife to continue to offer high calorie, high protein foods for patient. Continue 350 calorie ensure shake or equivalent.  Samples and coupons given today Wife has RD contact information    MONITORING, EVALUATION, GOAL: weight trends, intake   NEXT VISIT: as needed  Hugh Kamara B. Zenia Resides, [place], [place] Registered Dietitian [phone] (mobile)

## 2021-04-17 NOTE — Patient Instructions (Signed)
[facility] [place]  Discharge Instructions: Thank you for choosing [facility] to provide your oncology and hematology care.  If you have a lab appointment with the [facility], please go directly to the [facility] and check in at the registration area.  Wear comfortable clothing and clothing appropriate for easy access to any Portacath or PICC line.   We strive to give you quality time with your provider. You may need to reschedule your appointment if you arrive late (15 or more minutes).  Arriving late affects you and other patients whose appointments are after yours.  Also, if you miss three or more appointments without notifying the office, you may be dismissed from the clinic at the providers discretion.      For prescription refill requests, have your pharmacy contact our office and allow 72 hours for refills to be completed.    Today you received the following chemotherapy and/or immunotherapy agents : Keytruda / Carboplatin     To help prevent nausea and vomiting after your treatment, we encourage you to take your nausea medication as directed.  BELOW ARE SYMPTOMS THAT SHOULD BE REPORTED IMMEDIATELY: *FEVER GREATER THAN 100.4 F (38 C) OR HIGHER *CHILLS OR SWEATING *NAUSEA AND VOMITING THAT IS NOT CONTROLLED WITH YOUR NAUSEA MEDICATION *UNUSUAL SHORTNESS OF BREATH *UNUSUAL BRUISING OR BLEEDING *URINARY PROBLEMS (pain or burning when urinating, or frequent urination) *BOWEL PROBLEMS (unusual diarrhea, constipation, pain near the anus) TENDERNESS IN MOUTH AND THROAT WITH OR WITHOUT PRESENCE OF ULCERS (sore throat, sores in mouth, or a toothache) UNUSUAL RASH, SWELLING OR PAIN  UNUSUAL VAGINAL DISCHARGE OR ITCHING   Items with * indicate a potential emergency and should be followed up as soon as possible or go to the Emergency Department if any problems should occur.  Please show the CHEMOTHERAPY ALERT CARD or IMMUNOTHERAPY ALERT CARD at  check-in to the Emergency Department and triage nurse.  Should you have questions after your visit or need to cancel or reschedule your appointment, please contact [facility] [place] [facility] [place]  [phone] and follow the prompts.  Office hours are 8:00 a.m. to 4:30 p.m. Monday - Friday. Please note that voicemails left after 4:00 p.m. may not be returned until the following business day.  We are closed weekends and major holidays. You have access to a nurse at all times for urgent questions. Please call the main number to the clinic [phone] and follow the prompts.  For any non-urgent questions, you may also contact your provider using MyChart. We now offer e-Visits for anyone 81 and older to request care online for non-urgent symptoms. For details visit [email or web address].   Also download the MyChart app! Go to the app store, search "MyChart", open the app, select [facility], and log in with your MyChart username and password.  Due to Covid, a mask is required upon entering the hospital/clinic. If you do not have a mask, one will be given to you upon arrival. For doctor visits, patients may have 1 support person aged 81 or older with them. For treatment visits, patients cannot have anyone with them due to current Covid guidelines and our immunocompromised population.

## 2021-05-04 ENCOUNTER — Encounter: Payer: Self-pay | Admitting: Oncology

## 2021-05-04 ENCOUNTER — Inpatient Hospital Stay: Payer: Medicare Other | Attending: Oncology

## 2021-05-04 ENCOUNTER — Inpatient Hospital Stay (HOSPITAL_BASED_OUTPATIENT_CLINIC_OR_DEPARTMENT_OTHER): Payer: Medicare Other | Admitting: Oncology

## 2021-05-04 ENCOUNTER — Inpatient Hospital Stay: Payer: Medicare Other

## 2021-05-04 ENCOUNTER — Other Ambulatory Visit: Payer: Self-pay

## 2021-05-04 VITALS — BP 138/80 | HR 63 | Temp 96.9°F | Wt 98.9 lb

## 2021-05-04 DIAGNOSIS — Z5112 Encounter for antineoplastic immunotherapy: Secondary | ICD-10-CM | POA: Insufficient documentation

## 2021-05-04 DIAGNOSIS — C329 Malignant neoplasm of larynx, unspecified: Secondary | ICD-10-CM | POA: Diagnosis not present

## 2021-05-04 DIAGNOSIS — Z7901 Long term (current) use of anticoagulants: Secondary | ICD-10-CM | POA: Insufficient documentation

## 2021-05-04 DIAGNOSIS — C3412 Malignant neoplasm of upper lobe, left bronchus or lung: Secondary | ICD-10-CM | POA: Insufficient documentation

## 2021-05-04 DIAGNOSIS — C108 Malignant neoplasm of overlapping sites of oropharynx: Secondary | ICD-10-CM | POA: Insufficient documentation

## 2021-05-04 DIAGNOSIS — R918 Other nonspecific abnormal finding of lung field: Secondary | ICD-10-CM | POA: Diagnosis not present

## 2021-05-04 DIAGNOSIS — R634 Abnormal weight loss: Secondary | ICD-10-CM | POA: Diagnosis not present

## 2021-05-04 DIAGNOSIS — Z8546 Personal history of malignant neoplasm of prostate: Secondary | ICD-10-CM | POA: Insufficient documentation

## 2021-05-04 DIAGNOSIS — C3492 Malignant neoplasm of unspecified part of left bronchus or lung: Secondary | ICD-10-CM | POA: Diagnosis not present

## 2021-05-04 DIAGNOSIS — Z86718 Personal history of other venous thrombosis and embolism: Secondary | ICD-10-CM | POA: Insufficient documentation

## 2021-05-04 DIAGNOSIS — C109 Malignant neoplasm of oropharynx, unspecified: Secondary | ICD-10-CM

## 2021-05-04 DIAGNOSIS — C7951 Secondary malignant neoplasm of bone: Secondary | ICD-10-CM | POA: Diagnosis not present

## 2021-05-04 DIAGNOSIS — Z5111 Encounter for antineoplastic chemotherapy: Secondary | ICD-10-CM

## 2021-05-04 DIAGNOSIS — Z923 Personal history of irradiation: Secondary | ICD-10-CM | POA: Insufficient documentation

## 2021-05-04 DIAGNOSIS — Z87891 Personal history of nicotine dependence: Secondary | ICD-10-CM | POA: Insufficient documentation

## 2021-05-04 DIAGNOSIS — Z9221 Personal history of antineoplastic chemotherapy: Secondary | ICD-10-CM | POA: Insufficient documentation

## 2021-05-04 LAB — CBC WITH DIFFERENTIAL/PLATELET
Abs Immature Granulocytes: 0.01 10*3/uL (ref 0.00–0.07)
Basophils Absolute: 0 10*3/uL (ref 0.0–0.1)
Basophils Relative: 0 %
Eosinophils Absolute: 0 10*3/uL (ref 0.0–0.5)
Eosinophils Relative: 0 %
HCT: 34.9 % — ABNORMAL LOW (ref 39.0–52.0)
Hemoglobin: 11.2 g/dL — ABNORMAL LOW (ref 13.0–17.0)
Immature Granulocytes: 0 %
Lymphocytes Relative: 15 %
Lymphs Abs: 0.7 10*3/uL (ref 0.7–4.0)
MCH: 29.6 pg (ref 26.0–34.0)
MCHC: 32.1 g/dL (ref 30.0–36.0)
MCV: 92.3 fL (ref 80.0–100.0)
Monocytes Absolute: 0.6 10*3/uL (ref 0.1–1.0)
Monocytes Relative: 13 %
Neutro Abs: 3.2 10*3/uL (ref 1.7–7.7)
Neutrophils Relative %: 72 %
Platelets: 164 10*3/uL (ref 150–400)
RBC: 3.78 MIL/uL — ABNORMAL LOW (ref 4.22–5.81)
RDW: 14.3 % (ref 11.5–15.5)
WBC: 4.5 10*3/uL (ref 4.0–10.5)
nRBC: 0 % (ref 0.0–0.2)

## 2021-05-04 LAB — COMPREHENSIVE METABOLIC PANEL
ALT: 21 U/L (ref 0–44)
AST: 24 U/L (ref 15–41)
Albumin: 3.6 g/dL (ref 3.5–5.0)
Alkaline Phosphatase: 75 U/L (ref 38–126)
Anion gap: 6 (ref 5–15)
BUN: 16 mg/dL (ref 8–23)
CO2: 27 mmol/L (ref 22–32)
Calcium: 9.3 mg/dL (ref 8.9–10.3)
Chloride: 106 mmol/L (ref 98–111)
Creatinine, Ser: 0.76 mg/dL (ref 0.61–1.24)
GFR, Estimated: >60 mL/min (ref >60)
Glucose, Bld: 120 mg/dL — ABNORMAL HIGH (ref 70–99)
Potassium: 3.7 mmol/L (ref 3.5–5.1)
Sodium: 139 mmol/L (ref 135–145)
Total Bilirubin: 0.5 mg/dL (ref 0.3–1.2)
Total Protein: 7.2 g/dL (ref 6.5–8.1)

## 2021-05-04 MED ORDER — HEPARIN SOD (PORK) LOCK FLUSH 100 UNIT/ML IV SOLN
500.0000 [IU] | Freq: Once | INTRAVENOUS | Status: AC
  Administered 2021-05-04: 500 [IU] via INTRAVENOUS
  Filled 2021-05-04: qty 5

## 2021-05-04 NOTE — Progress Notes (Signed)
Hematology/Oncology Progress note [phone] [phone]      Patient Care Team: Casilda Carls, MD as PCP - General (Internal Medicine) Earlie Server, [name], [name]  HISTORY OF PRESENTING ILLNESS:  History of prostate cancer. 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  PD-L1-IHC 40%, no targetable mutations. # 09/07/2019 chemotherapy [cisplatin] and radiation.   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  Interval decrease of the posterior left upper lobe pleural-based nodule with interval decrease in size of the tiny adjacent satellite nodule.  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  No recurrent tumor in right piriform sinus.  Right lateral pharyngeal lymph node is stable.  No new or recurrent adenopathy.Suspect metastasis.  I recommend patient to obtain PET scan restaging.  I discussed about the plan of re-biopsy of either the paraspinal soft tissue mass versus other more feasible hypermetabolic sites detected on PET scan. Patient was seen by Dr. Kennyth Lose and opted to proceed with palliative radiation as soon as possible.  PET scan cannot be arranged prior to the radiation so the PET scan was canceled. 04/20/2020 -04/25/2020, palliative radiation to thoracic spine  05/10/2020, MRI thoracic spine was obtained for  worsening of back pain and left shoulder pain.  Images showed metastatic disease throughout almost the entire T2 with and associated mild superior endplate compression fracture.  Abnormal signal in the anterior, inferior endplate of T1, superior aspect of the T3.  Small metastatic deposit in T12 is also noted. Case was also discussed on multidisciplinary tumor board on 05/25/2020 05/22/2020, PET scan showed new hypermetabolic lytic bone metastasis in the left scapular near the glenoid and in the T2 vertebral body.  Persistent hypermetabolic right lateral retropharyngeal and right level 3 neck lymph node metastasis.  Peripheral left upper lobe pulmonary nodule is mildly decreased with new surrounding mild platelike postradiation changes..  No residual recurrent neoplasm at the site of vomiting in the right piriform sinus  06/07/2020 -06/09/2020 patient underwent palliative radiation to left scapular April 2022 seen by [name] and flexible laryngoscope examination showed new lesion at the right false vocal cord.  Case was discussed on tumor board on 06/22/2020. [name] recommend additional CT soft tissue neck with contrast for additional evaluation. 06/29/2020 CT images were reviewed and discussed with patient. There is new malignant lymph node in the right mid neck at the level of the hyoid, this compressing the right jugular vein with thrombus in the vein above and below the lymph node.  Cystic retropharyngeal lymph node on the right is stable.  Progressive soft tissue thickening in the larynx bilaterally suspicious for tumor.  Metastatic disease at the T2 is unchanged.  Metastatic deposit left scapula there is now a pathological fracture of the scapular. CT scan findings were discussed with ENT [name] via secure chat.  [name] feels surgery is not feasible.  The larynx lesion currently does not  compromise his airway however further progression may in the future  07/10/2020 Seen by Radonc  [name] who would offer salvage radiation to his larynx as well as neck adenopathy. Would like low dose chemotherapy as sensitizer.    Patient was also referred to establish care with Dr. Georgiann Cocker at [facility] for kyphoplasty.  Patient's wife reports that they were never contacted and she called Dr. Georgiann Cocker office with no success.  # NGS.  PD-L1-IHC 40%, no targetable mutations # 07/25/20- 07/27/2020, 08/01/20- 09/06/2020  concurrent chemoradiation [low-dose weekly cisplatin $RemoveBeforeDE'30mg'gXBRgZvVTlPqwQV$ /m2 ] to the larynx  09/15/2020, patient was seen by ENT [name].  Laryngoscope examination reviewed clear larynx, no lesion was discovered. 10/04/2020, started on maintenance Keytruda. 12/11/2020 restaging CT 1. Progression of Disease: - new since May right C3 destructive osseous metastasis has eroded the right pedicle and facet, with tumor filling the right C4 neural foramen and engulfing the right vertebral artery which remains patent. Mild right lateral epidural tumor. - small (8 mm short axis) but progressed right level 2A nodal disease with evidence of extracapsular extension. 2. 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  3. No recurrent laryngeal or pharyngeal tumor identified. 4. 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 CT Chest, Abdomen, and Pelvis the same day are reported separately.  01/16/2021, patient underwent additional palliative radiation to C-spine. 02/05/2021 Patient had a fall and went to emergency room on .   CT cervical spine without contrast showed stable destructive process in the right side of the T3 vertebral body.  Progressive destructive process in the right side and the posterior element of C4.  No fracture.  03/06/2021, MRI cervical spine with and without contrast showed destructive metastatic lesion at C4 involving the right posterior  elements.  There is mild epidural disease without canal stenosis.  Involvement of right C3-C4 and C4-C5 foraminal.  Abnormal signal at the inferior C3 endplate is probably on degenerative basis.  Destructive metastatic lesion at T2 with pathological fracture. 03/09/2021 patient was seen by neurosurgeon Dr. Cari Caraway.  Based on his current disease burden, dementia, lack of obvious unstable lesion, surgical fixation was not recommended.  Imaging surveillance was recommended and Dr. Cari Caraway is happy to review his imaging over time.  If he has worsening tumor burden, could consider vertebroplasty at T2 or at C4. 03/20/2021, CT chest abdomen pelvis with contrast showed Left upper lobe subpleural nodule[previously treated with radiation] showed interval decrease, with evolving radiation pneumonitis/fibrosis. Multiple bilateral cystic pulmonary lesions, increased in size, at least 1 of which with significant wall thickening.  Interval enlargement of a solid nodule of the right upper lobe, consistent with an enlarging metastasis.  New irregular groundglass and consolidation in the dependent right upper lobe, lateral segment right middle lobe, suggesting nonspecific infection or aspiration.  An additional manifestation of malignancy is difficult to exclude.  Emphysema.  CAD.  INTERVAL HISTORY Real Cona is a 81 y.o. male who has above history reviewed by me today presents for follow-up of head and neck cancer and lung cancer  Status post 2 cycles of carboplatin AUC 4/Keytruda.  Patient reports feeling well at baseline.  Patient denies nausea vomiting diarrhea.  Accompanied by wife.  Review of Systems  Constitutional:  Positive for fatigue. Negative for appetite change, chills, fever and unexpected weight change.  HENT:   Positive for hearing loss.   Eyes:  Negative for eye problems and icterus.  Respiratory:  Negative for chest tightness, cough and shortness of breath.   Cardiovascular:  Negative for  chest pain and leg swelling.  Gastrointestinal:  Negative for abdominal distention, abdominal pain, blood in stool and nausea.  Endocrine: Negative for hot flashes.  Genitourinary:  Negative for difficulty urinating, dysuria and frequency.   Musculoskeletal:  Positive for arthralgias.  Skin:  Negative for itching and rash.  Neurological:  Negative for extremity weakness, light-headedness and numbness.  Hematological:  Negative for adenopathy. Does not bruise/bleed easily.  Psychiatric/Behavioral:  Negative for confusion.        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   Prostate CA ([place]) 2008   Squamous cell lung cancer ([place]) 06/23/2019    SURGICAL HISTORY: Past Surgical History:  Procedure Laterality Date   BRAIN SURGERY  2012   HERNIA REPAIR     PORTA CATH INSERTION N/A 07/27/2019   Procedure: PORTA CATH INSERTION;  Surgeon: Katha Cabal, MD;  Location: [facility];  Service: Cardiovascular;  Laterality: N/A;    SOCIAL HISTORY: Social History   Socioeconomic History   Marital status: Married    Spouse name: Not on file   Number of children: Not on file   Years of education: Not on file   Highest education level: Not on file  Occupational History   Not on file  Tobacco Use   Smoking status: Former    Years: 21.00    Types: Cigarettes    Quit date: 05/11/2008    Years since quitting: 12.9   Smokeless tobacco: Never  Vaping Use   Vaping Use: Never used  Substance and Sexual Activity   Alcohol use: Yes    Comment: 0.5 pint liquor a week   Drug use: Never   Sexual activity: Not Currently  Other Topics Concern   Not on file  Social History Narrative   Lives at home with wife. Wife states he has some  dementia but is able to sign his own consent.   Social Determinants of Health   Financial Resource Strain: Not on file  Food Insecurity: Not on file  Transportation Needs: Not on file  Physical Activity: Not on file  Stress: Not on file  Social Connections: Not on file  Intimate Partner Violence: Not on file    FAMILY HISTORY: History reviewed. No pertinent family history.  ALLERGIES:  is allergic to ace inhibitors and enalapril.  MEDICATIONS:  Current Outpatient Medications  Medication Sig Dispense Refill   donepezil (ARICEPT) 10 MG tablet Take 10 mg by mouth at bedtime.     dorzolamide-timolol (COSOPT) 22.3-6.8 MG/ML ophthalmic solution Place 1 drop into both eyes 2 (two) times daily.      ELIQUIS 2.5 MG TABS tablet TAKE 1 TABLET BY MOUTH TWICE A DAY 60 tablet 1   folic acid (FOLVITE) 1 MG tablet Take 1 mg by mouth daily.     latanoprost (XALATAN) 0.005 % ophthalmic solution Place 1 drop into both eyes at bedtime.      lidocaine-prilocaine (EMLA) cream Apply 1 application topically as needed. Apply small amount to port site approx 1-2 hours prior to appointment. 30 g 1   MAG64 64  MG TBEC TAKE 1 TABLET (64 MG TOTAL) BY MOUTH 2 (TWO) TIMES DAILY. 180 tablet 0   Multiple Vitamins-Minerals (CENTRUM ADULTS PO) Take by mouth.     nystatin (MYCOSTATIN) 100000 UNIT/ML suspension Take 5 mLs (500,000 Units total) by mouth 4 (four) times daily. 480 mL 1   prochlorperazine (COMPAZINE) 10 MG tablet Take 1 tablet (10 mg total) by mouth every 8 (eight) hours as needed for nausea or vomiting. 60 tablet 0   Thiamine HCl (VITAMIN B-1 PO) Take 100 mg by mouth daily.     atorvastatin (LIPITOR) 20 MG tablet Take 20 mg by mouth daily. (Patient not taking: Reported on 04/04/2021)     colchicine 0.6 MG tablet Take 0.6 mg by mouth daily. SMARTSIG:1 Tablet(s) By Mouth Daily (Patient not taking: Reported on 02/06/2021)     oxyCODONE (OXY IR/ROXICODONE) 5 MG immediate release tablet Take 1 tablet (5 mg total)  by mouth every 4 (four) hours as needed for severe pain. (Patient not taking: Reported on 04/04/2021) 90 tablet 0   No current facility-administered medications for this visit.   Facility-Administered Medications Ordered in Other Visits  Medication Dose Route Frequency Provider Last Rate Last Admin   sodium chloride flush (NS) 0.9 % injection 10 mL  10 mL Intravenous PRN Earlie Server, MD   10 mL at 03/01/20 0034     PHYSICAL EXAMINATION: ECOG PERFORMANCE STATUS: 1 - Symptomatic but completely ambulatory Vitals:   05/04/21 0830  BP: 138/80  Pulse: 63  Temp: (!) 96.9 F (36.1 C)   Filed Weights   05/04/21 0830  Weight: 98 lb 14.4 oz (44.9 kg)    Physical Exam Constitutional:      General: He is not in acute distress. HENT:     Head: Normocephalic and atraumatic.  Eyes:     General: No scleral icterus. Cardiovascular:     Rate and Rhythm: Normal rate and regular rhythm.     Heart sounds: Normal heart sounds.  Pulmonary:     Effort: Pulmonary effort is normal. No respiratory distress.     Breath sounds: No wheezing.  Abdominal:     General: Bowel sounds are normal. There is no distension.     Palpations: Abdomen is soft.  Musculoskeletal:        General: No deformity.     Cervical back: Normal range of motion and neck supple.  Lymphadenopathy:     Cervical: No cervical adenopathy.  Skin:    General: Skin is warm and dry.     Findings: No erythema or rash.  Neurological:     Mental Status: He is alert. Mental status is at baseline.     Cranial Nerves: No cranial nerve deficit.     Coordination: Coordination normal.    LABORATORY DATA:  I have reviewed the data as listed Lab Results  Component Value Date   WBC 4.5 05/04/2021   HGB 11.2 (L) 05/04/2021   HCT 34.9 (L) 05/04/2021   MCV 92.3 05/04/2021   PLT 164 05/04/2021   Recent Labs    01/11/21 0918 01/11/21 1351 04/04/21 0833 04/17/21 0747 05/04/21 0755  NA SPECIMEN CONTAMINATED, UNABLE TO PERFORM TEST(S).    < > 139 137 139  K SPECIMEN CONTAMINATED, UNABLE TO PERFORM TEST(S).   < > 4.0 3.9 3.7  CL SPECIMEN CONTAMINATED, UNABLE TO PERFORM TEST(S).   < > 103 104 106  CO2 SPECIMEN CONTAMINATED, UNABLE TO PERFORM TEST(S).   < > $R'28 27 27  'sY$ GLUCOSE SPECIMEN CONTAMINATED, UNABLE  TO PERFORM TEST(S).   < > 110* 108* 120*  BUN SPECIMEN CONTAMINATED, UNABLE TO PERFORM TEST(S).   < > $R'20 17 16  'Vr$ CREATININE SPECIMEN CONTAMINATED, UNABLE TO PERFORM TEST(S).   < > 0.68 0.74 0.76  CALCIUM SPECIMEN CONTAMINATED, UNABLE TO PERFORM TEST(S).   < > 9.4 9.1 9.3  GFRNONAA SPECIMEN CONTAMINATED, UNABLE TO PERFORM TEST(S).   < > >60 >60 >60  PROT 3.8*   < > 7.2 6.7 7.2  ALBUMIN 2.0*   < > 3.6 3.5 3.6  AST 9*   < > $R'20 20 24  'ZF$ ALT 8   < > $R'16 16 21  'aI$ ALKPHOS 30*   < > 75 69 75  BILITOT 0.5   < > 0.4 0.4 0.5  BILIDIR <0.1  --   --   --   --   IBILI NOT CALCULATED  --   --   --   --    < > = values in this interval not displayed.    Iron/TIBC/Ferritin/ %Sat No results found for: IRON, TIBC, FERRITIN, IRONPCTSAT    RADIOGRAPHIC STUDIES: I have personally reviewed the radiological images as listed and agreed with the findings in the report. CT HEAD WO CONTRAST (5MM)  Result Date: 02/05/2021 CLINICAL DATA:  Fall.  Head trauma, moderate-severe EXAM: CT HEAD WITHOUT CONTRAST TECHNIQUE: Contiguous axial images were obtained from the base of the skull through the vertex without intravenous contrast. COMPARISON:  None. FINDINGS: Brain: There is atrophy and chronic small vessel disease changes. No acute intracranial abnormality. Specifically, no hemorrhage, hydrocephalus, mass lesion, acute infarction, or significant intracranial injury. Vascular: No hyperdense vessel or unexpected calcification. Skull: No acute calvarial abnormality. Postoperative changes in the frontal regions bilaterally. Sinuses/Orbits: No acute findings Other: None IMPRESSION: Atrophy, chronic microvascular disease. No acute intracranial abnormality.  Electronically Signed   By: Rolm Baptise M.D.   On: 02/05/2021 18:22   CT SOFT TISSUE NECK W CONTRAST  Result Date: 03/21/2021 CLINICAL DATA:  [place] pharyngeal cancer. Assess response to treatment. 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 EXAM: CT NECK WITH CONTRAST TECHNIQUE: Multidetector CT imaging of the neck was performed using the standard protocol following the bolus administration of intravenous contrast. RADIATION DOSE REDUCTION: This exam was performed according to the departmental dose-optimization program which includes automated exposure control, adjustment of the mA and/or kV according to patient size and/or use of iterative reconstruction technique. CONTRAST:  148mL OMNIPAQUE IOHEXOL 300 MG/ML  SOLN COMPARISON:  CT cervical spine 12/12/2020. MRI cervical spine 03/06/2021 FINDINGS: Pharynx and larynx: Post radiation changes in the larynx and epiglottis with edema. There is retropharyngeal edema similar to the prior study. Salivary glands: Post radiation change with hyperenhancement of the parotid and submandibular gland bilaterally. No mass lesion. Thyroid: Negative Lymph nodes: Retropharyngeal lymph node on the right is slightly smaller measuring 15 x 20 mm with central necrosis. Treated lymph node mass around the right carotid artery on the right appears stable. 8 mm lymph node posterior to the sternocleidomastoid muscle in the upper neck is stable. See axial image 35. No pathologic lymph nodes in the left neck. Vascular: Severe atherosclerotic stenosis right carotid bifurcation. Right internal carotid artery is small but patent. Moderate atherosclerotic stenosis left internal carotid artery. Chronic occlusion right jugular vein. Left jugular vein patent. Limited intracranial: No enhancing lesion in the brain. Visualized orbits: Negative Mastoids and visualized paranasal sinuses: Minimal mucosal edema paranasal sinuses. Mastoid clear bilaterally. Skeleton: Destructive mass lesion  in the  right skull base lateral to the clivus has developed since the prior CT. In retrospect this is seen on the prior MRI. No significant intracranial extension of tumor. Destructive mass in the right lateral C3 vertebral body appears progressive. There is destruction of the right C3 pedicle and progressive destruction of the lamina on the right. There is tumor filling the C3-4 foramen on the right. Progressive epidural tumor in the canal on the right without cord compression. Destructive mass lesion on the right at T2 is unchanged and appears to be a treated metastatic deposit. Associated levoscoliosis unchanged. Upper chest: Chest CT from today reported separately Other: None IMPRESSION: 1. Progressive metastatic disease in the right skull base involving the right occipital bone. No intracranial extension. 2. Progression of metastatic disease to the right lateral mass of C3 with progressive bone destruction and progressive epidural tumor in the canal. No spinal stenosis. There is tumor filling the right C3-4 foramen. 3. Stable treated metastatic disease right T2 vertebral body 4. Necrotic retropharyngeal node on the right slightly smaller. Stable lymph nodes in the right neck. 5. Post radiation changes in the neck as above. 6. Severe stenosis right internal carotid artery due to atherosclerotic calcification with string sign. Moderate stenosis left internal carotid artery. Electronically Signed   By: Franchot Gallo M.D.   On: 03/21/2021 15:43   CT Cervical Spine Wo Contrast  Result Date: 02/05/2021 CLINICAL DATA:  Fall. Neck trauma. History of or pharyngeal carcinoma and left upper lobe squamous cell lung carcinoma. EXAM: CT CERVICAL SPINE WITHOUT CONTRAST TECHNIQUE: Multidetector CT imaging of the cervical spine was performed without intravenous contrast. Multiplanar CT image reconstructions were also generated. COMPARISON:  Neck CT 12/11/2020 FINDINGS: Alignment: No subluxation. Skull base and vertebrae:  Destructive lesions in the right side of the C4 vertebral body and posterior elements and T2 vertebral body are again noted. T2 is stable. Progressive destruction noted in the right side of C4 compatible with metastatic disease. No fracture. Soft tissues and spinal canal: No prevertebral fluid or swelling. No visible canal hematoma. Disc levels: Advanced diffuse degenerative disc disease and facet disease bilaterally. Upper chest: Cystic area/lesion in the left upper lobe measures 14 mm, stable. Other: No acute findings IMPRESSION: Stable destructive process in the right side of the T2 vertebral body. Progressive destructive process in the right side and posterior elements of C4. Advanced diffuse degenerative disc and facet disease. No fracture. Electronically Signed   By: Rolm Baptise M.D.   On: 02/05/2021 18:21   MR Cervical Spine W Wo Contrast  Result Date: 03/06/2021 CLINICAL DATA:  Head and neck cancer, C spine lesion EXAM: MRI CERVICAL SPINE WITHOUT AND WITH CONTRAST TECHNIQUE: Multiplanar and multiecho pulse sequences of the cervical spine, to include the craniocervical junction and cervicothoracic junction, were obtained without and with intravenous contrast. CONTRAST:  76mL GADAVIST GADOBUTROL 1 MMOL/ML IV SOLN COMPARISON:  Prior CT imaging FINDINGS: Alignment: Minor degenerative listhesis. Vertebrae: Abnormal marrow signal and enhancement at C4 with involvement of the right posterior elements. Abnormal marrow signal at the inferior C3 endplate may reflect additional disease or may be on a degenerative basis. Abnormal signal at the right aspect of the T2 vertebral body with pathologic fracture. Multilevel degenerative endplate irregularity. Cord: No abnormal signal.  No abnormal intrathecal enhancement. Posterior Fossa, vertebral arteries, paraspinal tissues: Right vertebral artery is surrounded by abnormal soft tissue at the C4 level but the flow void remains preserved. Mild prevertebral edema. Right  retropharyngeal cystic/necrotic lesion measures similar to prior  CT (series 5, image 4). Left cerebellar developmental venous anomaly. Disc levels: C2-C3:  Fusion of facets.  No significant stenosis. C3-C4: Disc bulge with endplate osteophytes. Uncovertebral and facet hypertrophy. Minor right ventral, dorsal, and lateral epidural extension of above described disease without canal stenosis. Above described disease involves the right neural foramen. Mild left foraminal degenerative stenosis. C4-C5: Disc bulge with endplate osteophytes. Uncovertebral and facet hypertrophy. Above described disease encroaches on the right neural foramen. There is no significant canal or left foraminal stenosis. C5-C6: Disc bulge with endplate osteophytes. Uncovertebral and facet hypertrophy. Effacement of the ventral subarachnoid space without significant canal stenosis. Mild right foraminal stenosis. No left foraminal stenosis. C6-C7: Disc bulge with endplate osteophytes. Uncovertebral and facet hypertrophy. Partial effacement of the ventral subarachnoid space without significant canal stenosis. Minor right foraminal stenosis. No left foraminal stenosis. C7-T1: Disc bulge with endplate osteophytes. Facet hypertrophy. No canal or right foraminal stenosis. Minor left foraminal stenosis. IMPRESSION: Destructive metastatic lesion at C4 involving the right posterior elements. There is mild epidural disease without canal stenosis. Involvement of right C3-C4 and C4-C5 foramina. Abnormal signal at the inferior C3 endplate is probably on a degenerative basis, less likely additional metastasis. Destructive metastatic lesion at T2 with pathologic fracture. Probably no substantial progression from December 2022 cervical spine CT. Electronically Signed   By: Macy Mis M.D.   On: 03/06/2021 12:14   CT CHEST ABDOMEN PELVIS W CONTRAST  Result Date: 03/21/2021 CLINICAL DATA:  Left upper lobe squamous cell carcinoma, metastatic oropharyngeal  carcinoma restaging EXAM: CT CHEST, ABDOMEN, AND PELVIS WITH CONTRAST TECHNIQUE: Multidetector CT imaging of the chest, abdomen and pelvis was performed following the standard protocol during bolus administration of intravenous contrast. RADIATION DOSE REDUCTION: This exam was performed according to the departmental dose-optimization program which includes automated exposure control, adjustment of the mA and/or kV according to patient size and/or use of iterative reconstruction technique. CONTRAST:  160mL OMNIPAQUE IOHEXOL 300 MG/ML SOLN additional oral enteric contrast COMPARISON:  12/11/2020 FINDINGS: CT CHEST FINDINGS Cardiovascular: Right chest port catheter. Aortic atherosclerosis. Normal heart size. Extensive three-vessel coronary artery calcifications. No pericardial effusion. Mediastinum/Nodes: No enlarged mediastinal, hilar, or axillary lymph nodes. Thyroid gland, trachea, and esophagus demonstrate no significant findings. Lungs/Pleura: Continued slight interval decrease in size of a subpleural nodule of the peripheral posterior left upper lobe, measuring 2.2 x 2.2 cm, previously 2.7 x 2.3 cm, with adjacent ground-glass and retraction (series 5, image 65). Interval increase in size of a cystic lesion of the paramedian left upper lobe, measuring 1.6 x 1.4 cm with new wall thickening, previously 1.1 x 1.1 cm (series 5, image 26). Interval increase in size of a multiple additional subpleural cystic lesions of the anterior left upper lobe, measuring 1.5 x 1.1 cm, previously 1.1 x 0.7 cm, however this remains thin walled (series 5, image 65), as well as similar lesions of the bilateral lower lobes (series 5, image 115). Interval increase in size of a subpleural nodule of the medial anterior right upper lobe, measuring 1.2 x 0.8 cm, previously 0.7 x 0.5 cm (series 5, image 76). There is additionally new irregular ground-glass and consolidation in the dependent right upper lobe (series 5, image 50), largest  nodular component in this vicinity measuring 1.1 x 0.9 cm (series 5, image 57). New scattered centrilobular nodularity and ground-glass in the lateral segment right middle lobe (series 5, image 102), with a discrete nodule in this vicinity measuring 0.5 x 0.4 cm (series 5, image 115). Mild predominantly paraseptal  emphysema. No pleural effusion or pneumothorax. Musculoskeletal: No chest wall mass or suspicious osseous lesions identified. CT ABDOMEN PELVIS FINDINGS Hepatobiliary: No solid liver abnormality is seen. No gallstones, gallbladder wall thickening, or biliary dilatation. Pancreas: Unremarkable. No pancreatic ductal dilatation or surrounding inflammatory changes. Spleen: Normal in size without significant abnormality. Adrenals/Urinary Tract: Adrenal glands are unremarkable. Kidneys are normal, without renal calculi, solid lesion, or hydronephrosis. Bladder is unremarkable. Stomach/Bowel: Stomach is within normal limits. Appendix appears normal. No evidence of bowel wall thickening, distention, or inflammatory changes. Pancolonic diverticulosis. Vascular/Lymphatic: No significant vascular findings are present. No enlarged abdominal or pelvic lymph nodes. Reproductive: Prostate brachytherapy. Other: No abdominal wall hernia or abnormality. No ascites. Musculoskeletal: No acute osseous findings. IMPRESSION: 1. Continued slight interval decrease in size of a subpleural nodule of the peripheral posterior left upper lobe, with adjacent ground-glass and retraction, consistent with evolving radiation pneumonitis and fibrosis about a treated nodule. 2. There are however multiple bilateral cystic pulmonary lesions, which are increased in size, at least one of which with significant wall thickening, although unusual in appearance highly concerning for squamous cell metastases given enlargement and wall thickening. 3. Interval enlargement of a solid pulmonary nodule of the right upper lobe, consistent with an enlarging  metastasis. 4. There is additionally new irregular ground-glass and consolidation in the dependent right upper lobe, as well as in the lateral segment right middle lobe, configuration and distribution of these findings most suggestive of nonspecific infection or aspiration, however an additional manifestation of malignancy is difficult to exclude. Attention on follow-up. 5. Emphysema. 6. Coronary artery disease. Aortic Atherosclerosis (ICD10-I70.0). Electronically Signed   By: Delanna Ahmadi M.D.   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 Encounter for antineoplastic immunotherapy   5. Encounter for antineoplastic chemotherapy   6. 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 Tongue base mass extending into right piriform sinus [hypopharynx] cT4 cN2b cM0- Stage IVB p16 negative, July 2021 status post chemoradiation March 2022 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 Labs reviewed and discussed with Overall he tolerates treatment well He will proceed with carboplatin AUC 4.5, Keytruda on 05/08/2021    #Hypomagnesia, continue oral Slow-Mag, continue 1 tablet twice daily.   #Left upper lobe squamous cell carcinoma, stage I Finished SBRT on 02/07/2020.- size continues to decrease However there are multiple new lung lesions, likely metastatic lesion from primary origin or head and neck origin.Marland Kitchen  #Right  jugular vein thrombus, with his cancer progression, continue Eliquis 2.5 mg twice daily for prophylaxis.  #Weight loss.  Continue follow-up with nutritionist.  Continue nutrition supplements.  All questions were answered. The patient knows to call the clinic with any problems questions or concerns.   Return of visit: Lab MD carboplatin/Keytruda in 3 weeks   Earlie Server, MD, PhD 05/04/2021

## 2021-05-08 ENCOUNTER — Other Ambulatory Visit: Payer: Self-pay

## 2021-05-08 ENCOUNTER — Inpatient Hospital Stay: Payer: Medicare Other

## 2021-05-08 VITALS — BP 143/67 | HR 55 | Temp 96.4°F | Resp 18 | Wt 100.0 lb

## 2021-05-08 DIAGNOSIS — Z5112 Encounter for antineoplastic immunotherapy: Secondary | ICD-10-CM | POA: Diagnosis not present

## 2021-05-08 DIAGNOSIS — C329 Malignant neoplasm of larynx, unspecified: Secondary | ICD-10-CM

## 2021-05-08 MED ORDER — SODIUM CHLORIDE 0.9 % IV SOLN
289.3500 mg | Freq: Once | INTRAVENOUS | Status: AC
  Administered 2021-05-08: 290 mg via INTRAVENOUS
  Filled 2021-05-08: qty 29

## 2021-05-08 MED ORDER — SODIUM CHLORIDE 0.9 % IV SOLN
10.0000 mg | Freq: Once | INTRAVENOUS | Status: AC
  Administered 2021-05-08: 10 mg via INTRAVENOUS
  Filled 2021-05-08: qty 10

## 2021-05-08 MED ORDER — DIPHENHYDRAMINE HCL 50 MG/ML IJ SOLN
50.0000 mg | Freq: Once | INTRAMUSCULAR | Status: AC
  Administered 2021-05-08: 50 mg via INTRAVENOUS
  Filled 2021-05-08: qty 1

## 2021-05-08 MED ORDER — SODIUM CHLORIDE 0.9 % IV SOLN
Freq: Once | INTRAVENOUS | Status: AC
  Filled 2021-05-08: qty 250

## 2021-05-08 MED ORDER — SODIUM CHLORIDE 0.9 % IV SOLN
200.0000 mg | Freq: Once | INTRAVENOUS | Status: AC
  Administered 2021-05-08: 200 mg via INTRAVENOUS
  Filled 2021-05-08: qty 200

## 2021-05-08 MED ORDER — HEPARIN SOD (PORK) LOCK FLUSH 100 UNIT/ML IV SOLN
500.0000 [IU] | Freq: Once | INTRAVENOUS | Status: AC | PRN
  Administered 2021-05-08: 500 [IU]
  Filled 2021-05-08: qty 5

## 2021-05-08 MED ORDER — PALONOSETRON HCL INJECTION 0.25 MG/5ML
0.2500 mg | Freq: Once | INTRAVENOUS | Status: AC
  Administered 2021-05-08: 0.25 mg via INTRAVENOUS
  Filled 2021-05-08: qty 5

## 2021-05-08 MED ORDER — SODIUM CHLORIDE 0.9 % IV SOLN
150.0000 mg | Freq: Once | INTRAVENOUS | Status: AC
  Administered 2021-05-08: 150 mg via INTRAVENOUS
  Filled 2021-05-08: qty 150

## 2021-05-08 NOTE — Patient Instructions (Signed)
[facility] [place]  Discharge Instructions: °Thank you for choosing [facility] to provide your oncology and hematology care.  ° °If you have a lab appointment with the Cancer Center, please go directly to the Cancer Center and check in at the registration area. °  °Wear comfortable clothing and clothing appropriate for easy access to any Portacath or PICC line.  ° °We strive to give you quality time with your provider. You may need to reschedule your appointment if you arrive late (15 or more minutes).  Arriving late affects you and other patients whose appointments are after yours.  Also, if you miss three or more appointments without notifying the office, you may be dismissed from the clinic at the provider’s discretion.    °  °For prescription refill requests, have your pharmacy contact our office and allow 72 hours for refills to be completed.   ° °Today you received the following chemotherapy and/or immunotherapy agents     °  °To help prevent nausea and vomiting after your treatment, we encourage you to take your nausea medication as directed. ° °BELOW ARE SYMPTOMS THAT SHOULD BE REPORTED IMMEDIATELY: °*FEVER GREATER THAN 100.4 F (38 °C) OR HIGHER °*CHILLS OR SWEATING °*NAUSEA AND VOMITING THAT IS NOT CONTROLLED WITH YOUR NAUSEA MEDICATION °*UNUSUAL SHORTNESS OF BREATH °*UNUSUAL BRUISING OR BLEEDING °*URINARY PROBLEMS (pain or burning when urinating, or frequent urination) °*BOWEL PROBLEMS (unusual diarrhea, constipation, pain near the anus) °TENDERNESS IN MOUTH AND THROAT WITH OR WITHOUT PRESENCE OF ULCERS (sore throat, sores in mouth, or a toothache) °UNUSUAL RASH, SWELLING OR PAIN  °UNUSUAL VAGINAL DISCHARGE OR ITCHING  ° °Items with * indicate a potential emergency and should be followed up as soon as possible or go to the Emergency Department if any problems should occur. ° °Please show the CHEMOTHERAPY ALERT CARD or IMMUNOTHERAPY ALERT CARD at check-in to the  Emergency Department and triage nurse. ° °Should you have questions after your visit or need to cancel or reschedule your appointment, please contact [facility] [place]  Dept: [phone]  and follow the prompts.  Office hours are 8:00 a.m. to 4:30 p.m. Monday - Friday. Please note that voicemails left after 4:00 p.m. may not be returned until the following business day.  We are closed weekends and major holidays. You have access to a nurse at all times for urgent questions. Please call the main number to the clinic Dept: [phone] and follow the prompts. ° ° °For any non-urgent questions, you may also contact your provider using MyChart. We now offer e-Visits for anyone 18 and older to request care online for non-urgent symptoms. For details visit [email or web address]. °  °Also download the MyChart app! Go to the app store, search "MyChart", open the app, select [facility], and log in with your MyChart username and password. ° °Due to Covid, a mask is required upon entering the hospital/clinic. If you do not have a mask, one will be given to you upon arrival. For doctor visits, patients may have 1 support person aged 18 or older with them. For treatment visits, patients cannot have anyone with them due to current Covid guidelines and our immunocompromised population.  ° °

## 2021-05-21 ENCOUNTER — Telehealth: Payer: Self-pay | Admitting: *Deleted

## 2021-05-21 ENCOUNTER — Encounter: Payer: Self-pay | Admitting: *Deleted

## 2021-05-21 NOTE — Telephone Encounter (Signed)
Gregory Burton Key: [id number] - PA Case ID: [id number] ?PA required-Eliquis ? ?Approved Today ?Case VX:[id number];YYPEJY:[id number];Review Type:Prior Auth;Coverage Start Date:04/21/2021;Coverage End Date:05/21/2022 ? ?Voice mail Msg left for pharmacy to inform that script was approved. ?

## 2021-05-29 ENCOUNTER — Inpatient Hospital Stay: Payer: Medicare Other | Attending: Oncology

## 2021-05-29 ENCOUNTER — Inpatient Hospital Stay (HOSPITAL_BASED_OUTPATIENT_CLINIC_OR_DEPARTMENT_OTHER): Payer: Medicare Other | Admitting: Oncology

## 2021-05-29 ENCOUNTER — Encounter: Payer: Self-pay | Admitting: Oncology

## 2021-05-29 ENCOUNTER — Inpatient Hospital Stay: Payer: Medicare Other

## 2021-05-29 VITALS — BP 145/70 | HR 65 | Temp 96.6°F | Resp 20 | Wt 99.6 lb

## 2021-05-29 DIAGNOSIS — Z5112 Encounter for antineoplastic immunotherapy: Secondary | ICD-10-CM | POA: Diagnosis present

## 2021-05-29 DIAGNOSIS — Z5111 Encounter for antineoplastic chemotherapy: Secondary | ICD-10-CM | POA: Diagnosis present

## 2021-05-29 DIAGNOSIS — Z1329 Encounter for screening for other suspected endocrine disorder: Secondary | ICD-10-CM

## 2021-05-29 DIAGNOSIS — C3492 Malignant neoplasm of unspecified part of left bronchus or lung: Secondary | ICD-10-CM

## 2021-05-29 DIAGNOSIS — C7951 Secondary malignant neoplasm of bone: Secondary | ICD-10-CM | POA: Insufficient documentation

## 2021-05-29 DIAGNOSIS — F1027 Alcohol dependence with alcohol-induced persisting dementia: Secondary | ICD-10-CM

## 2021-05-29 DIAGNOSIS — C109 Malignant neoplasm of oropharynx, unspecified: Secondary | ICD-10-CM | POA: Insufficient documentation

## 2021-05-29 DIAGNOSIS — C329 Malignant neoplasm of larynx, unspecified: Secondary | ICD-10-CM

## 2021-05-29 DIAGNOSIS — R634 Abnormal weight loss: Secondary | ICD-10-CM

## 2021-05-29 DIAGNOSIS — Z79899 Other long term (current) drug therapy: Secondary | ICD-10-CM | POA: Insufficient documentation

## 2021-05-29 LAB — TSH: TSH: 2.644 u[IU]/mL (ref 0.350–4.500)

## 2021-05-29 LAB — CBC WITH DIFFERENTIAL/PLATELET
Abs Immature Granulocytes: 0.01 10*3/uL (ref 0.00–0.07)
Basophils Absolute: 0 10*3/uL (ref 0.0–0.1)
Basophils Relative: 0 %
Eosinophils Absolute: 0 10*3/uL (ref 0.0–0.5)
Eosinophils Relative: 1 %
HCT: 34.4 % — ABNORMAL LOW (ref 39.0–52.0)
Hemoglobin: 11.1 g/dL — ABNORMAL LOW (ref 13.0–17.0)
Immature Granulocytes: 0 %
Lymphocytes Relative: 16 %
Lymphs Abs: 0.7 10*3/uL (ref 0.7–4.0)
MCH: 30 pg (ref 26.0–34.0)
MCHC: 32.3 g/dL (ref 30.0–36.0)
MCV: 93 fL (ref 80.0–100.0)
Monocytes Absolute: 0.7 10*3/uL (ref 0.1–1.0)
Monocytes Relative: 17 %
Neutro Abs: 2.7 10*3/uL (ref 1.7–7.7)
Neutrophils Relative %: 66 %
Platelets: 155 10*3/uL (ref 150–400)
RBC: 3.7 MIL/uL — ABNORMAL LOW (ref 4.22–5.81)
RDW: 14.8 % (ref 11.5–15.5)
WBC: 4.1 10*3/uL (ref 4.0–10.5)
nRBC: 0 % (ref 0.0–0.2)

## 2021-05-29 LAB — COMPREHENSIVE METABOLIC PANEL
ALT: 20 U/L (ref 0–44)
AST: 22 U/L (ref 15–41)
Albumin: 3.4 g/dL — ABNORMAL LOW (ref 3.5–5.0)
Alkaline Phosphatase: 73 U/L (ref 38–126)
Anion gap: 7 (ref 5–15)
BUN: 19 mg/dL (ref 8–23)
CO2: 27 mmol/L (ref 22–32)
Calcium: 9.3 mg/dL (ref 8.9–10.3)
Chloride: 106 mmol/L (ref 98–111)
Creatinine, Ser: 0.65 mg/dL (ref 0.61–1.24)
GFR, Estimated: >60 mL/min (ref >60)
Glucose, Bld: 113 mg/dL — ABNORMAL HIGH (ref 70–99)
Potassium: 3.9 mmol/L (ref 3.5–5.1)
Sodium: 140 mmol/L (ref 135–145)
Total Bilirubin: 0.2 mg/dL — ABNORMAL LOW (ref 0.3–1.2)
Total Protein: 6.9 g/dL (ref 6.5–8.1)

## 2021-05-29 LAB — T4, FREE: Free T4: 0.9 ng/dL (ref 0.61–1.12)

## 2021-05-29 MED ORDER — SODIUM CHLORIDE 0.9 % IV SOLN
10.0000 mg | Freq: Once | INTRAVENOUS | Status: AC
  Administered 2021-05-29: 10 mg via INTRAVENOUS
  Filled 2021-05-29: qty 10

## 2021-05-29 MED ORDER — HEPARIN SOD (PORK) LOCK FLUSH 100 UNIT/ML IV SOLN
500.0000 [IU] | Freq: Once | INTRAVENOUS | Status: AC | PRN
  Administered 2021-05-29: 500 [IU]
  Filled 2021-05-29: qty 5

## 2021-05-29 MED ORDER — SODIUM CHLORIDE 0.9 % IV SOLN
289.3500 mg | Freq: Once | INTRAVENOUS | Status: AC
  Administered 2021-05-29: 290 mg via INTRAVENOUS
  Filled 2021-05-29: qty 29

## 2021-05-29 MED ORDER — SODIUM CHLORIDE 0.9 % IV SOLN
150.0000 mg | Freq: Once | INTRAVENOUS | Status: AC
  Administered 2021-05-29: 150 mg via INTRAVENOUS
  Filled 2021-05-29: qty 150

## 2021-05-29 MED ORDER — NYSTATIN 100000 UNIT/ML MT SUSP: 5.0000 mL | Freq: Four times a day (QID) | OROMUCOSAL | 3 refills | Status: AC

## 2021-05-29 MED ORDER — SODIUM CHLORIDE 0.9 % IV SOLN
Freq: Once | INTRAVENOUS | Status: AC
  Filled 2021-05-29: qty 250

## 2021-05-29 MED ORDER — DIPHENHYDRAMINE HCL 50 MG/ML IJ SOLN
50.0000 mg | Freq: Once | INTRAMUSCULAR | Status: AC
  Administered 2021-05-29: 50 mg via INTRAVENOUS
  Filled 2021-05-29: qty 1

## 2021-05-29 MED ORDER — HEPARIN SOD (PORK) LOCK FLUSH 100 UNIT/ML IV SOLN
INTRAVENOUS | Status: AC
  Filled 2021-05-29: qty 5

## 2021-05-29 MED ORDER — PALONOSETRON HCL INJECTION 0.25 MG/5ML
0.2500 mg | Freq: Once | INTRAVENOUS | Status: AC
  Administered 2021-05-29: 0.25 mg via INTRAVENOUS
  Filled 2021-05-29: qty 5

## 2021-05-29 MED ORDER — SODIUM CHLORIDE 0.9 % IV SOLN
200.0000 mg | Freq: Once | INTRAVENOUS | Status: AC
  Administered 2021-05-29: 200 mg via INTRAVENOUS
  Filled 2021-05-29: qty 200

## 2021-05-29 NOTE — Patient Instructions (Signed)
[facility] [place]  Discharge Instructions: ?Thank you for choosing [facility] to provide your oncology and hematology care.  ?If you have a lab appointment with the [facility], please go directly to the [facility] and check in at the registration area. ? ?Wear comfortable clothing and clothing appropriate for easy access to any Portacath or PICC line.  ? ?We strive to give you quality time with your provider. You may need to reschedule your appointment if you arrive late (15 or more minutes).  Arriving late affects you and other patients whose appointments are after yours.  Also, if you miss three or more appointments without notifying the office, you may be dismissed from the clinic at the provider?s discretion.    ?  ?For prescription refill requests, have your pharmacy contact our office and allow 72 hours for refills to be completed.   ? ?Today you received the following chemotherapy and/or immunotherapy agents Keytruda and Carboplatin     ?  ?To help prevent nausea and vomiting after your treatment, we encourage you to take your nausea medication as directed. ? ?BELOW ARE SYMPTOMS THAT SHOULD BE REPORTED IMMEDIATELY: ?*FEVER GREATER THAN 100.4 F (38 ?C) OR HIGHER ?*CHILLS OR SWEATING ?*NAUSEA AND VOMITING THAT IS NOT CONTROLLED WITH YOUR NAUSEA MEDICATION ?*UNUSUAL SHORTNESS OF BREATH ?*UNUSUAL BRUISING OR BLEEDING ?*URINARY PROBLEMS (pain or burning when urinating, or frequent urination) ?*BOWEL PROBLEMS (unusual diarrhea, constipation, pain near the anus) ?TENDERNESS IN MOUTH AND THROAT WITH OR WITHOUT PRESENCE OF ULCERS (sore throat, sores in mouth, or a toothache) ?UNUSUAL RASH, SWELLING OR PAIN  ?UNUSUAL VAGINAL DISCHARGE OR ITCHING  ? ?Items with * indicate a potential emergency and should be followed up as soon as possible or go to the Emergency Department if any problems should occur. ? ?Please show the CHEMOTHERAPY ALERT CARD or IMMUNOTHERAPY ALERT CARD  at check-in to the Emergency Department and triage nurse. ? ?Should you have questions after your visit or need to cancel or reschedule your appointment, please contact [facility] [place] [facility] [place]  [phone] and follow the prompts.  Office hours are 8:00 a.m. to 4:30 p.m. Monday - Friday. Please note that voicemails left after 4:00 p.m. may not be returned until the following business day.  We are closed weekends and major holidays. You have access to a nurse at all times for urgent questions. Please call the main number to the clinic [phone] and follow the prompts. ? ?For any non-urgent questions, you may also contact your provider using MyChart. We now offer e-Visits for anyone 2 and older to request care online for non-urgent symptoms. For details visit [email or web address]. ?  ?Also download the MyChart app! Go to the app store, search "MyChart", open the app, select [facility], and log in with your MyChart username and password. ? ?Due to Covid, a mask is required upon entering the hospital/clinic. If you do not have a mask, one will be given to you upon arrival. For doctor visits, patients may have 1 support person aged 64 or older with them. For treatment visits, patients cannot have anyone with them due to current Covid guidelines and our immunocompromised population.  ?

## 2021-05-29 NOTE — Progress Notes (Signed)
?Hematology/Oncology Progress note ?[phone] [phone] ?  ? ? ? ?Patient Care Team: ?[name], [name], MD as PCP - General (Internal Medicine) ?Nataline Basara, MD as Consulting Physician (Oncology) ? ?REFERRING PROVIDER: ?[name], [name], MD  ?CHIEF COMPLAINTS/REASON FOR VISIT: ? follow up for head and neck cancer ? ?HISTORY OF PRESENTING ILLNESS:  ?History of prostate cancer. 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  PD-L1-IHC 40%, no targetable mutations. ?# 09/07/2019 chemotherapy [cisplatin] and radiation.  ? 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 02/07/20 finished SBRT to stage I squamous cell lung cancer ? ?03/31/2020 CT neck soft tissue as well as chest images were independently reviewed by me and discussed with patient and wife.Interval development of 2.7 x 2.4 soft tissue lesion destroying the right aspect of the T2 vertebral body and inferior aspect of the posterior right second rib.  Interval decrease of the posterior left upper lobe pleural-based nodule with interval decrease in size of the tiny adjacent satellite nodule.  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  No recurrent tumor in right piriform sinus.  Right lateral pharyngeal lymph node is stable.  No new or recurrent adenopathy.Suspect metastasis.  I recommend patient to obtain PET scan restaging.  I discussed about the plan of re-biopsy of either the paraspinal soft tissue mass versus other more feasible hypermetabolic sites detected on PET scan. ?Patient was seen by [name] and opted to proceed with palliative radiation as soon as possible.  PET scan cannot be arranged prior to the radiation so the PET scan was canceled. ?04/20/2020 -04/25/2020, palliative radiation to thoracic spine ? ?05/10/2020, MRI thoracic spine was obtained for  worsening of back pain and left shoulder pain.  Images showed metastatic disease throughout almost the entire T2 with and associated mild superior endplate compression fracture.  Abnormal signal in the anterior, inferior endplate of T1, superior aspect of the T3.  Small metastatic deposit in T12 is also noted. ?Case was also discussed on multidisciplinary tumor board on 05/25/2020 ?05/22/2020, PET scan showed new hypermetabolic lytic bone metastasis in the left scapular near the glenoid and in the T2 vertebral body.  Persistent hypermetabolic right lateral retropharyngeal and right level 3 neck lymph node metastasis.  Peripheral left upper lobe pulmonary nodule is mildly decreased with new surrounding mild platelike postradiation changes..  No residual recurrent neoplasm at the site of vomiting in the right piriform sinus ? ?06/07/2020 -06/09/2020 patient underwent palliative radiation to left scapular ?April 2022 seen by [name] and flexible laryngoscope examination showed new lesion at the right false vocal cord.  Case was discussed on tumor board on 06/22/2020. ?[name] recommend additional CT soft tissue neck with contrast for additional evaluation. ?06/29/2020 CT images were reviewed and discussed with patient. ?There is new malignant lymph node in the right mid neck at the level of the hyoid, this compressing the right jugular vein with thrombus in the vein above and below the lymph node.  Cystic retropharyngeal lymph node on the right is stable.  Progressive soft tissue thickening in the larynx bilaterally suspicious for tumor.  Metastatic disease at the T2 is unchanged.  Metastatic deposit left scapula there is now a pathological fracture of the scapular. ?CT scan findings were discussed with ENT [name] via secure chat.  [name] feels surgery is not feasible.  The larynx lesion currently does not   compromise his airway however further progression may in the future ? ?07/10/2020 Seen by Radonc  [name] who would offer salvage radiation to his larynx as well as neck adenopathy. Would like low dose chemotherapy as sensitizer.  ? ? ?Patient was also referred to establish care with Dr. Georgiann Cocker at [facility] for kyphoplasty.  Patient's wife reports that they were never contacted and she called Dr. Georgiann Cocker office with no success. ? ?# NGS.  PD-L1-IHC 40%, no targetable mutations ?# 07/25/20- 07/27/2020, 08/01/20- 09/06/2020  concurrent chemoradiation [low-dose weekly cisplatin $RemoveBeforeDE'30mg'[id number]$ /m2 ] to the larynx ? ?09/15/2020, patient was seen by ENT [name].  Laryngoscope examination reviewed clear larynx, no lesion was discovered. ?10/04/2020, started on maintenance Keytruda. ?12/11/2020 restaging CT ?1. Progression of Disease: ?- new since May right C3 destructive osseous metastasis has eroded the right pedicle and facet, with tumor filling the right C4 neural foramen and engulfing the right vertebral artery which remains patent. Mild right lateral epidural tumor. - small (8 mm short axis) but progressed right level 2A nodal disease with evidence of extracapsular extension. ?2. 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  ?3. No recurrent laryngeal or pharyngeal tumor identified. 4. 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 CT Chest, Abdomen, and Pelvis the same day are reported separately. ? ?01/16/2021, patient underwent additional palliative radiation to C-spine. ?02/05/2021 Patient had a fall and went to emergency room on .   ?CT cervical spine without contrast showed stable destructive process in the right side of the T3 vertebral body.  Progressive destructive process in the right side and the posterior element of C4.  No fracture. ? ?03/06/2021, MRI cervical spine with and without contrast showed destructive metastatic lesion at C4 involving the right posterior  elements.  There is mild epidural disease without canal stenosis.  Involvement of right C3-C4 and C4-C5 foraminal.  Abnormal signal at the inferior C3 endplate is probably on degenerative basis.  Destructive metastatic lesion at T2 with pathological fracture. ?03/09/2021 patient was seen by neurosurgeon Dr. Cari Caraway.  Based on his current disease burden, dementia, lack of obvious unstable lesion, surgical fixation was not recommended.  Imaging surveillance was recommended and Dr. Cari Caraway is happy to review his imaging over time.  If he has worsening tumor burden, could consider vertebroplasty at T2 or at C4. ?03/20/2021, CT chest abdomen pelvis with contrast showed ?Left upper lobe subpleural nodule[previously treated with radiation] showed interval decrease, with evolving radiation pneumonitis/fibrosis. ?Multiple bilateral cystic pulmonary lesions, increased in size, at least 1 of which with significant wall thickening.  Interval enlargement of a solid nodule of the right upper lobe, consistent with an enlarging metastasis.  New irregular groundglass and consolidation in the dependent right upper lobe, lateral segment right middle lobe, suggesting nonspecific infection or aspiration.  An additional manifestation of malignancy is difficult to exclude.  Emphysema.  CAD. ? ?INTERVAL HISTORY ?Gregory Burton is a 81 y.o. male who has above history reviewed by me today presents for follow-up of head and neck cancer and lung cancer ? ?Status post 3 cycles of dose reduced carboplatin /Keytruda.  Patient reports feeling well at baseline.  Chronic fatigue unchanged.  No nausea vomiting diarrhea. ?He is accompanied by his wife.  Weight has been stable.  He takes nutrition supplements. ? ?Review of Systems  ?Constitutional:  Positive for fatigue. Negative for appetite change, chills, fever and unexpected weight change.  ?HENT:  Positive for hearing loss.   ?Eyes:  Negative for eye problems and icterus.  ?Respiratory:   Negative for chest tightness, cough and shortness of breath.   ?Cardiovascular:  Negative for chest pain and leg swelling.  ?Gastrointestinal:  Negative for abdominal distention, abdominal pain, blood in stool and

## 2021-06-19 ENCOUNTER — Ambulatory Visit: Payer: Medicare Other

## 2021-06-19 ENCOUNTER — Ambulatory Visit: Payer: Medicare Other | Admitting: Oncology

## 2021-06-19 ENCOUNTER — Inpatient Hospital Stay: Payer: Medicare Other

## 2021-06-19 ENCOUNTER — Encounter: Payer: Self-pay | Admitting: Oncology

## 2021-06-19 ENCOUNTER — Other Ambulatory Visit: Payer: Medicare Other

## 2021-06-19 ENCOUNTER — Ambulatory Visit
Admission: RE | Admit: 2021-06-19 | Discharge: 2021-06-19 | Disposition: A | Discharge status: Home / Self Care | Payer: Medicare Other | Source: Ambulatory Visit | Attending: Oncology | Admitting: Oncology

## 2021-06-19 ENCOUNTER — Inpatient Hospital Stay (HOSPITAL_BASED_OUTPATIENT_CLINIC_OR_DEPARTMENT_OTHER): Payer: Medicare Other | Admitting: Oncology

## 2021-06-19 VITALS — BP 134/73 | HR 63

## 2021-06-19 VITALS — BP 151/93 | HR 80 | Temp 97.0°F | Wt 96.9 lb

## 2021-06-19 DIAGNOSIS — Z5112 Encounter for antineoplastic immunotherapy: Secondary | ICD-10-CM

## 2021-06-19 DIAGNOSIS — C109 Malignant neoplasm of oropharynx, unspecified: Secondary | ICD-10-CM | POA: Diagnosis not present

## 2021-06-19 DIAGNOSIS — G893 Neoplasm related pain (acute) (chronic): Secondary | ICD-10-CM

## 2021-06-19 DIAGNOSIS — C329 Malignant neoplasm of larynx, unspecified: Secondary | ICD-10-CM | POA: Diagnosis not present

## 2021-06-19 DIAGNOSIS — Z1329 Encounter for screening for other suspected endocrine disorder: Secondary | ICD-10-CM

## 2021-06-19 DIAGNOSIS — C3492 Malignant neoplasm of unspecified part of left bronchus or lung: Secondary | ICD-10-CM

## 2021-06-19 DIAGNOSIS — Z5111 Encounter for antineoplastic chemotherapy: Secondary | ICD-10-CM | POA: Diagnosis not present

## 2021-06-19 DIAGNOSIS — R634 Abnormal weight loss: Secondary | ICD-10-CM

## 2021-06-19 LAB — COMPREHENSIVE METABOLIC PANEL
ALT: 22 U/L (ref 0–44)
AST: 26 U/L (ref 15–41)
Albumin: 3.4 g/dL — ABNORMAL LOW (ref 3.5–5.0)
Alkaline Phosphatase: 72 U/L (ref 38–126)
Anion gap: 3 — ABNORMAL LOW (ref 5–15)
BUN: 17 mg/dL (ref 8–23)
CO2: 29 mmol/L (ref 22–32)
Calcium: 9.1 mg/dL (ref 8.9–10.3)
Chloride: 108 mmol/L (ref 98–111)
Creatinine, Ser: 0.73 mg/dL (ref 0.61–1.24)
GFR, Estimated: >60 mL/min (ref >60)
Glucose, Bld: 142 mg/dL — ABNORMAL HIGH (ref 70–99)
Potassium: 3.5 mmol/L (ref 3.5–5.1)
Sodium: 140 mmol/L (ref 135–145)
Total Bilirubin: 0.6 mg/dL (ref 0.3–1.2)
Total Protein: 6.8 g/dL (ref 6.5–8.1)

## 2021-06-19 LAB — CBC WITH DIFFERENTIAL/PLATELET
Abs Immature Granulocytes: 0.01 10*3/uL (ref 0.00–0.07)
Basophils Absolute: 0 10*3/uL (ref 0.0–0.1)
Basophils Relative: 1 %
Eosinophils Absolute: 0 10*3/uL (ref 0.0–0.5)
Eosinophils Relative: 1 %
HCT: 35 % — ABNORMAL LOW (ref 39.0–52.0)
Hemoglobin: 11.2 g/dL — ABNORMAL LOW (ref 13.0–17.0)
Immature Granulocytes: 0 %
Lymphocytes Relative: 15 %
Lymphs Abs: 0.6 10*3/uL — ABNORMAL LOW (ref 0.7–4.0)
MCH: 30.2 pg (ref 26.0–34.0)
MCHC: 32 g/dL (ref 30.0–36.0)
MCV: 94.3 fL (ref 80.0–100.0)
Monocytes Absolute: 0.5 10*3/uL (ref 0.1–1.0)
Monocytes Relative: 14 %
Neutro Abs: 2.6 10*3/uL (ref 1.7–7.7)
Neutrophils Relative %: 69 %
Platelets: 145 10*3/uL — ABNORMAL LOW (ref 150–400)
RBC: 3.71 MIL/uL — ABNORMAL LOW (ref 4.22–5.81)
RDW: 15.3 % (ref 11.5–15.5)
WBC: 3.7 10*3/uL — ABNORMAL LOW (ref 4.0–10.5)
nRBC: 0 % (ref 0.0–0.2)

## 2021-06-19 LAB — TSH: TSH: 2.699 u[IU]/mL (ref 0.350–4.500)

## 2021-06-19 LAB — T4, FREE: Free T4: 0.95 ng/dL (ref 0.61–1.12)

## 2021-06-19 IMAGING — CT CT CHEST-ABD-PELV W/ CM
2 of 5 series · 12 of 36 positions shown, 14 images · IV contrast (agent unspecified)
Comparison: Prior CTs [DATE] and [DATE].  PET-CT [DATE]

CLINICAL DATA: Restaging squamous cell left upper lobe lung cancer.
History of head and neck and prostate cancer. * Tracking Code: BO *

EXAM:
CT CHEST, ABDOMEN, AND PELVIS WITH CONTRAST
TECHNIQUE: Multidetector CT imaging of the chest, abdomen and pelvis was
performed following the standard protocol during bolus
administration of intravenous contrast.

[Series 3: cap with · axial · 0.71mm/px · z∈[-553,-18]mm · 9 of 125 slices shown, 11 images]
[im 9/125  mediastinal]
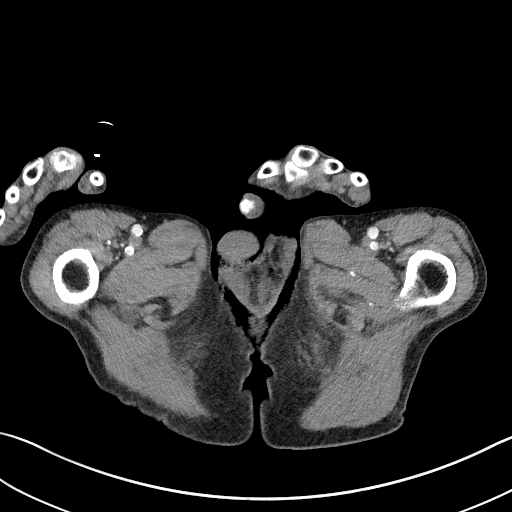
[im 9/125  bone]
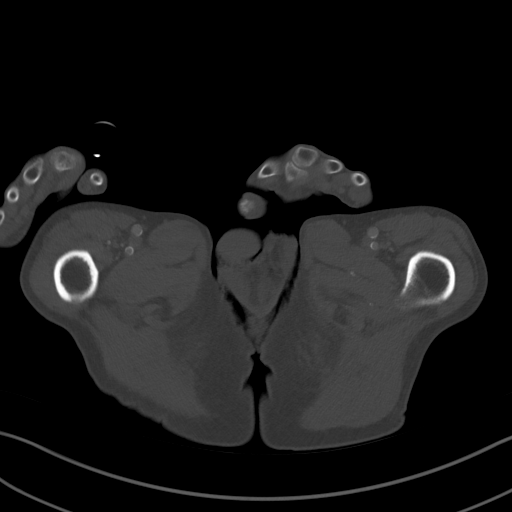
[im 25/125  mediastinal]
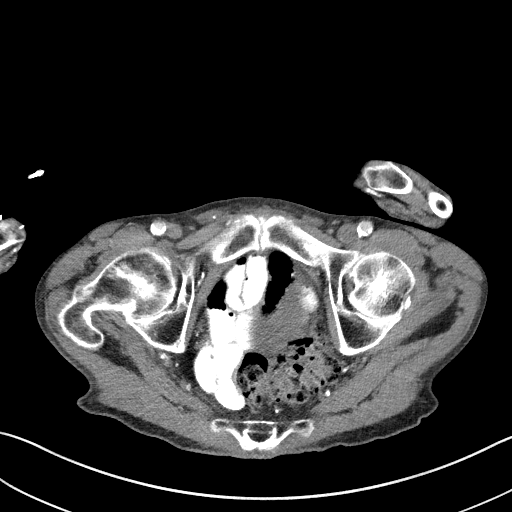
[im 34/125  mediastinal]
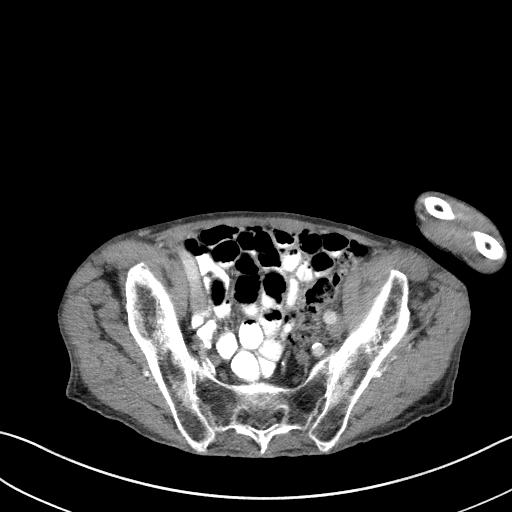
[im 50/125  mediastinal]
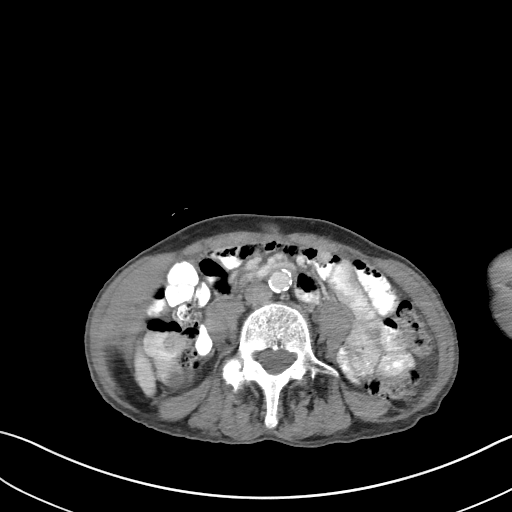
[im 67/125  mediastinal]
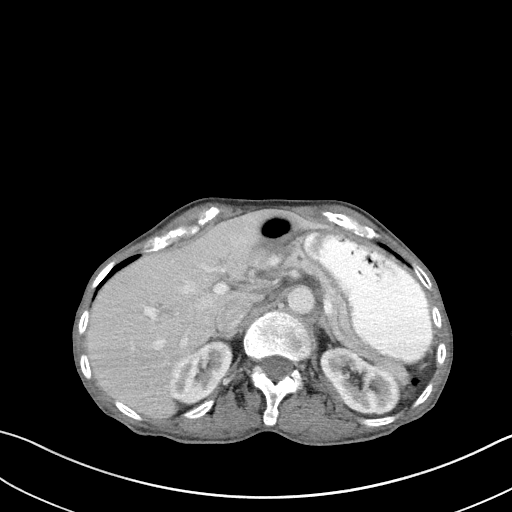
[im 75/125  mediastinal]
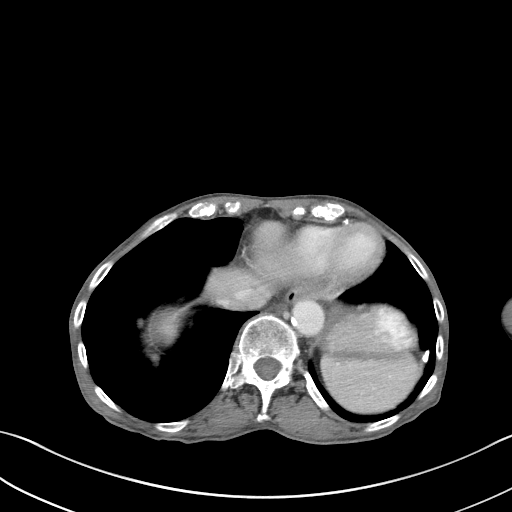
[im 91/125  mediastinal]
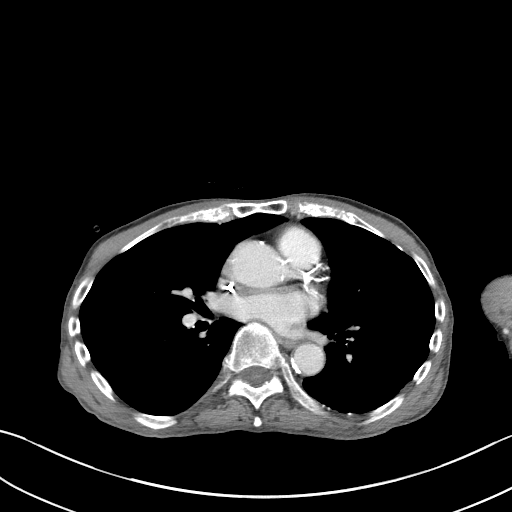
[im 100/125  mediastinal]
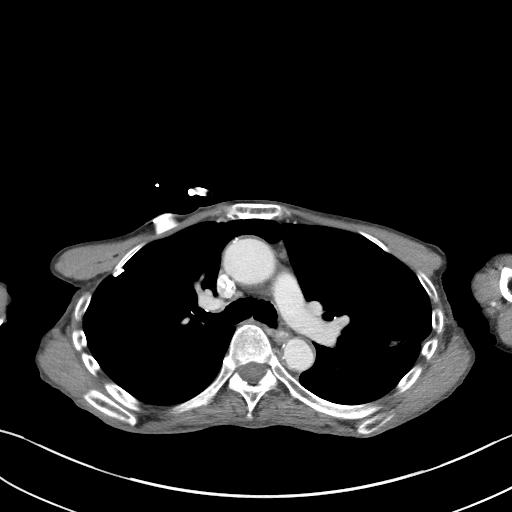
[im 116/125  mediastinal]
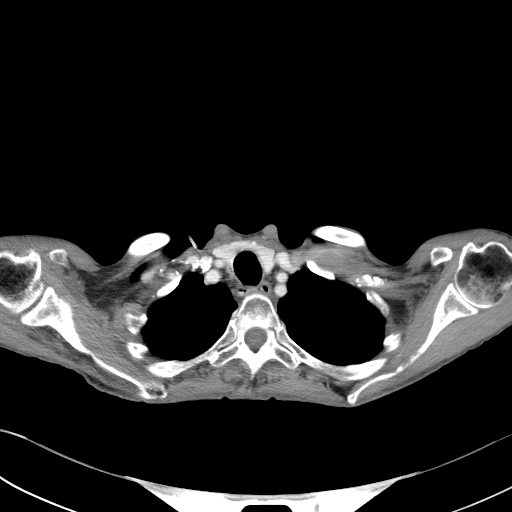
[im 116/125  bone]
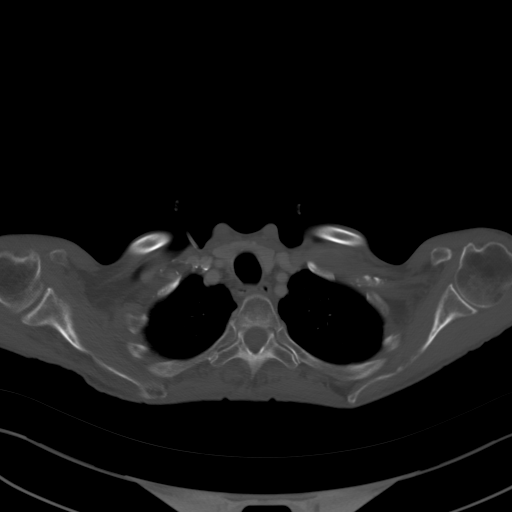

[Series 7: coronals · coronal · 0.79mm/px · 3 of 104 slices shown]
[im 21/104  mediastinal]
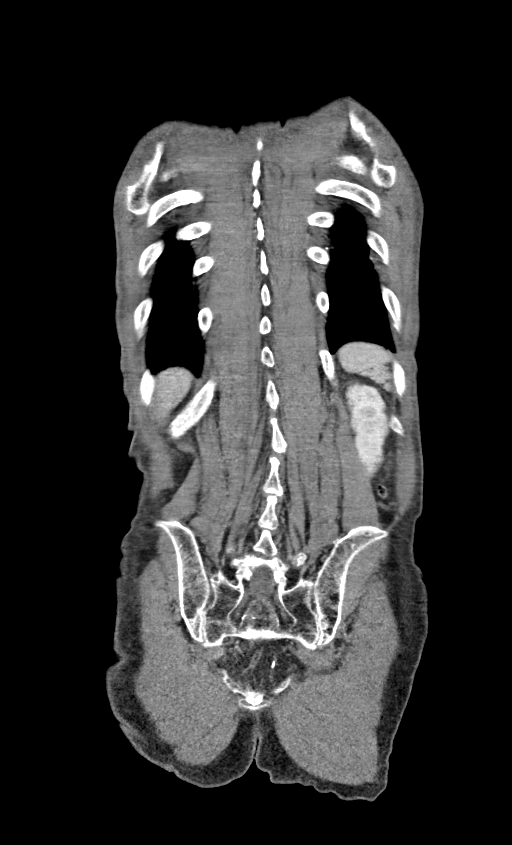
[im 42/104  mediastinal]
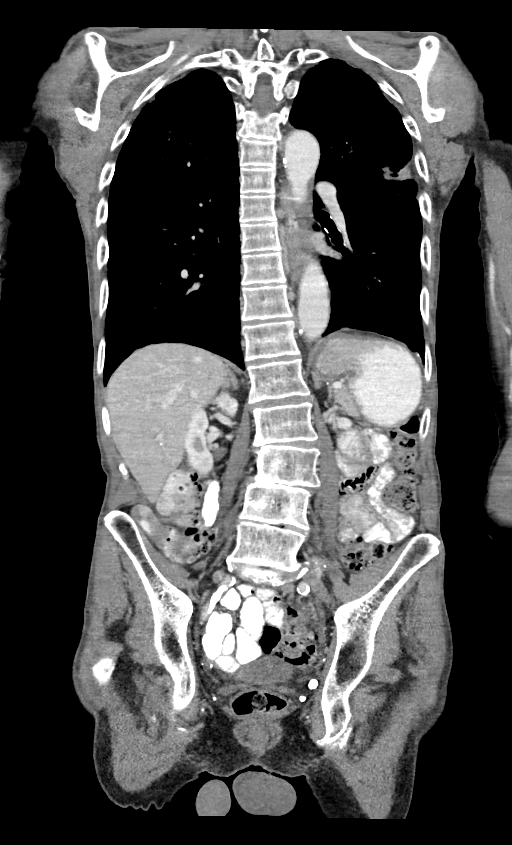
[im 62/104  mediastinal]
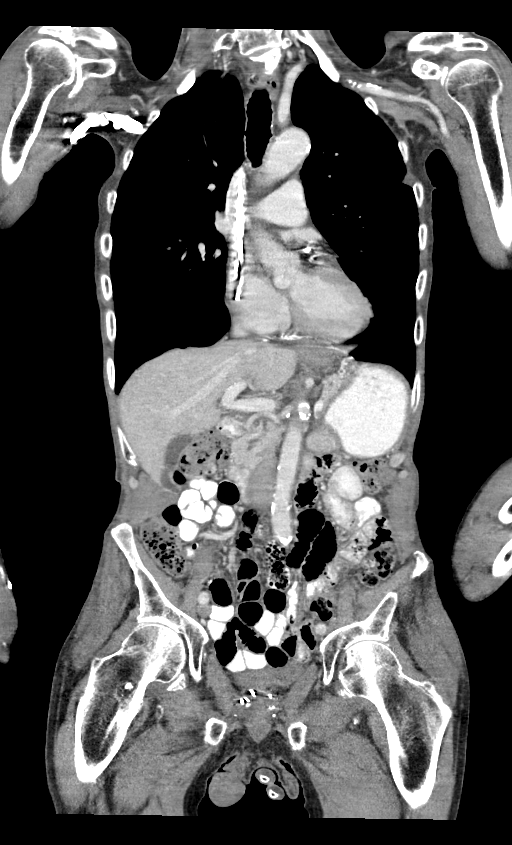

[12 of 36 positions shown; findings below may reference images not displayed]

RADIATION DOSE REDUCTION: This exam was performed according to the
departmental dose-optimization program which includes automated
exposure control, adjustment of the mA and/or kV according to
patient size and/or use of iterative reconstruction technique.

CONTRAST:  60mL OMNIPAQUE IOHEXOL 300 MG/ML  SOLN
FINDINGS: Neck findings are dictated separately.

Cardiovascular: Again demonstrated is extensive atherosclerosis of
the aorta, great vessels and coronary arteries. No acute vascular
findings are seen. Right IJ Port-A-Cath extends to the upper right
atrium. The heart size is normal. There is no pericardial effusion.

Mediastinum/Nodes: There are no enlarged mediastinal, hilar or
axillary lymph nodes. The thyroid gland, trachea and esophagus
demonstrate no significant findings.

Lungs/Pleura: No pleural effusion or pneumothorax. Moderate
centrilobular and paraseptal emphysema with scattered calcified and
noncalcified pleural plaque formation. The treated left upper lobe
lesion is ill-defined and partly obscured by adjacent radiation
changes. It appears stable to slightly smaller on the axial images,
measuring approximately 1.8 x 1.1 cm on image [DATE] (previously 2.2 x
2.2 cm). However, based on the sagittal images, little change is
identified. The lesion crosses the major fissure and extends into
superior segment of the lower lobe. There is progressive enlargement
of a solid lesion medially in the right upper lobe, lying posterior
to the sternum. This measures 1.6 x 0.7 cm on image [DATE] (previously
1.2 x 0.8 cm). No other highly suspicious pulmonary findings are
identified. The patchy nodularity and surrounding ground-glass
opacities previously seen in the right upper lobe have slightly
improved in the interval, favored to be inflammatory. There is
minimal similar involvement in the right middle lobe. The previously
described cystic lesions in both lungs are stable in size. There is
less wall thickening of the left apical lesion (image [DATE]), and no
current suspicious morphologic features.

Musculoskeletal/Chest wall: Interval sclerosis of the previously
demonstrated metastases involving the right aspect of T2 vertebral
body and left scapula consistent with response to treatment. No new
osseous metastases or chest wall mass is identified.

CT ABDOMEN AND PELVIS FINDINGS

Hepatobiliary: The liver is normal in density without suspicious
focal abnormality. No evidence of gallstones, gallbladder wall
thickening or biliary dilatation.

Pancreas: Unremarkable. No pancreatic ductal dilatation or
surrounding inflammatory changes.

Spleen: Normal in size without focal abnormality.

Adrenals/Urinary Tract: Both adrenal glands appear normal. The
kidneys appear stable without evidence of urinary tract calculus,
suspicious lesion or hydronephrosis. There are stable right renal
cysts, not requiring follow-up. No bladder abnormalities are seen.

Stomach/Bowel: Enteric contrast was administered and has passed into
proximal colon. The stomach appears unremarkable for its degree of
distension. No evidence of bowel wall thickening, distention or
surrounding inflammatory change. Diverticular changes throughout the
colon.

Vascular/Lymphatic: There are no enlarged abdominal or pelvic lymph
nodes. Diffuse aortic and branch vessel atherosclerosis without
acute vascular findings.

Reproductive: Prostate brachytherapy seeds without evidence of
recurrent mass lesion.

Other: No evidence of abdominal wall mass or hernia. No ascites.

Musculoskeletal: No acute or significant osseous findings. Convex
left lumbar scoliosis with associated spondylosis. Cortical lesion
involving the right femoral neck is unchanged, and not
hypermetabolic on previous PET-CT, presumed incidental benign
finding.
IMPRESSION: 1. Little change in appearance of the treated lesion in the left
upper lobe which crosses the major fissure. No local progression of
this lesion identified.
2. A solid nodule medially in the right upper lobe shows further
enlargement, suspicious for metastatic disease or a synchronous
primary lung cancer. This is new from PET-CT [DATE]. No other
findings suspicious for progressive metastatic disease.
3. The previously described ill-defined right upper lobe nodularity
and surrounding ground-glass opacities have improved, likely
inflammatory. Likewise, there is improved wall thickening of the
cystic left upper lobe lesion which currently demonstrates no
suspicious morphologic features. Continued follow-up recommended.
4. Treatment changes within the previously demonstrated lytic
lesions involving the T2 vertebral body and left scapula. No new
osseous metastases.
5. Coronary and aortic atherosclerosis ([QR]-[QR]). Emphysema
([QR]-[QR]).

## 2021-06-19 IMAGING — CT CT NECK W/ CM
3 of 6 series · 10 of 35 positions shown, 12 images · IV contrast (agent unspecified)
Comparison: [DATE]

CLINICAL DATA: Squamous cell carcinoma of lung, head and neck
cancer

EXAM:
CT NECK WITH CONTRAST
TECHNIQUE: Multidetector CT imaging of the neck was performed using the
standard protocol following the bolus administration of intravenous
contrast.

[Series 7: sag neck · sagittal · 0.45mm/px · 5 of 81 slices shown, 6 images]
[im 27/81  bone]
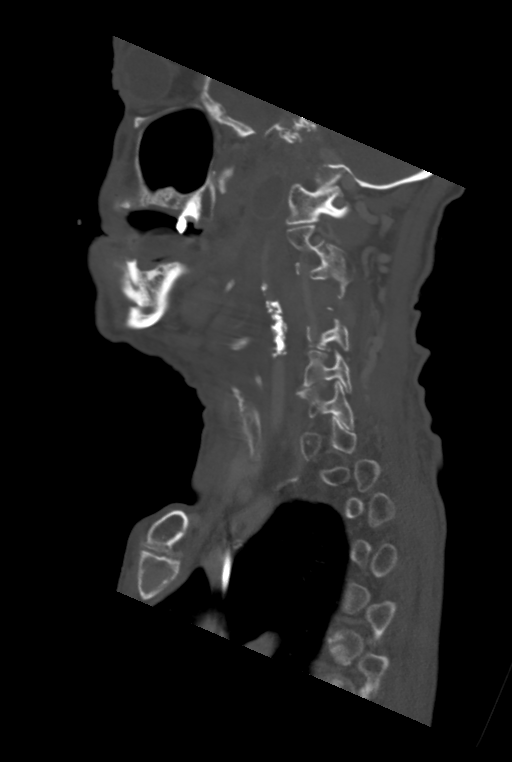
[im 34/81  bone]
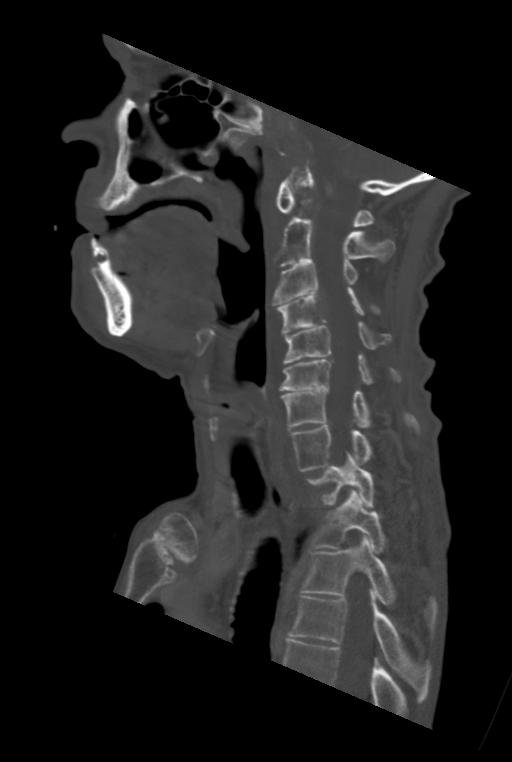
[im 41/81  soft-tissue]
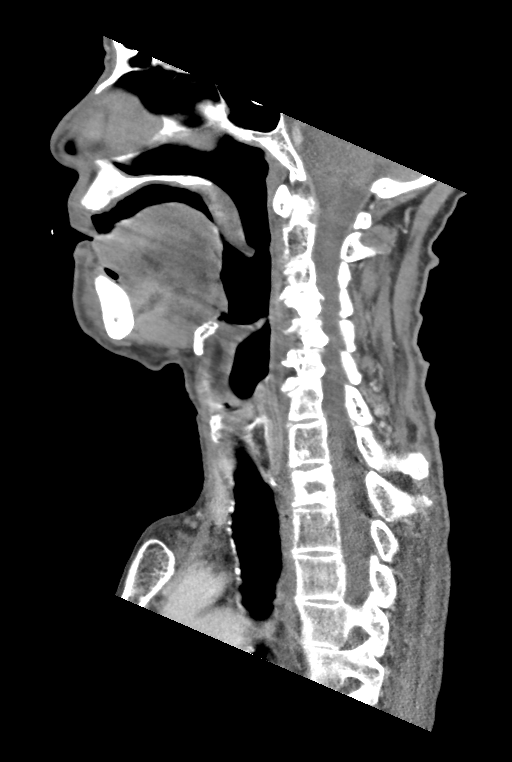
[im 41/81  bone]
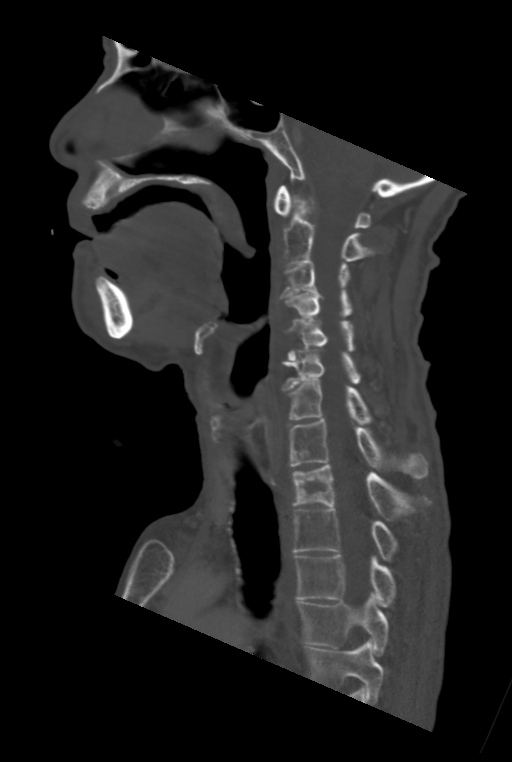
[im 47/81  bone]
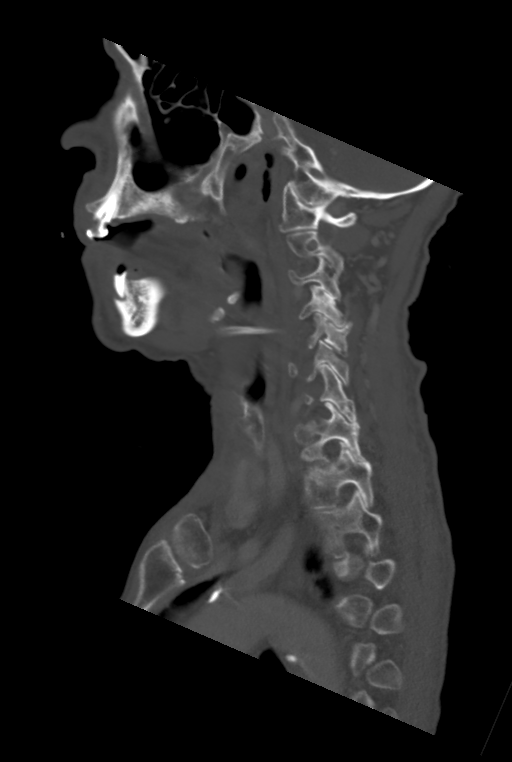
[im 54/81  bone]
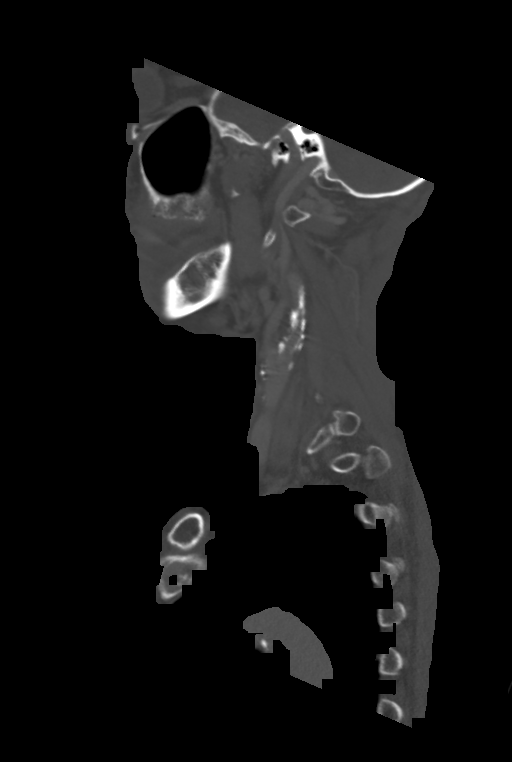

[Series 8: cor neck · coronal · 0.37mm/px · 3 of 115 slices shown]
[im 39/115  bone]
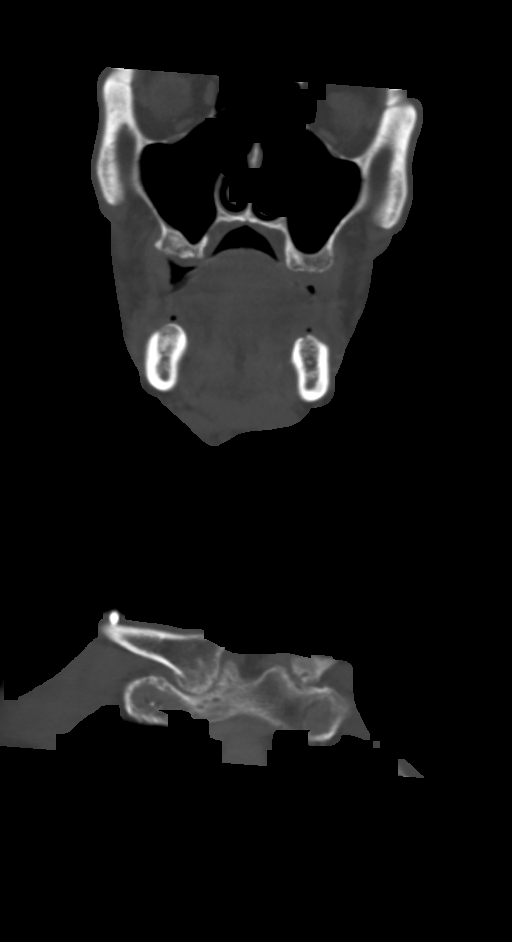
[im 51/115  bone]
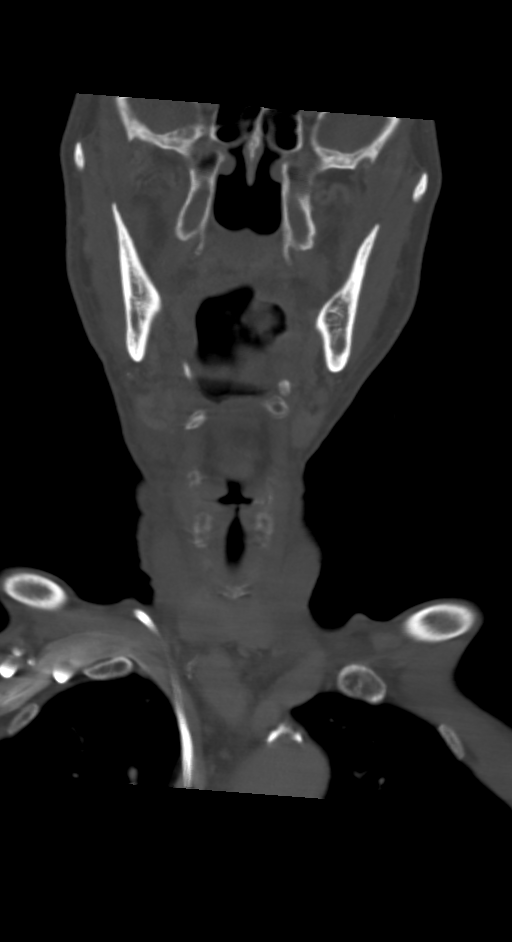
[im 64/115  bone]
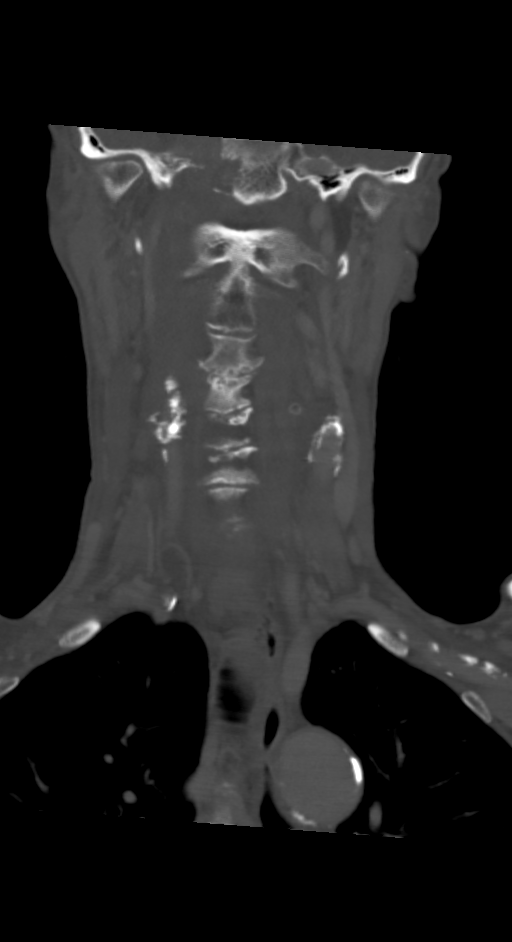

[Series 9: orthogonal (person_name) · axial · 0.38mm/px · z∈[-96,+60]mm · 2 of 171 slices shown, 3 images]
[im 43/171  soft-tissue]
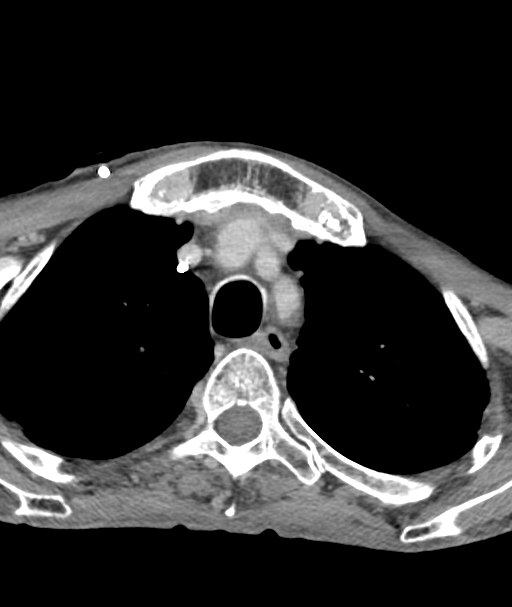
[im 43/171  bone]
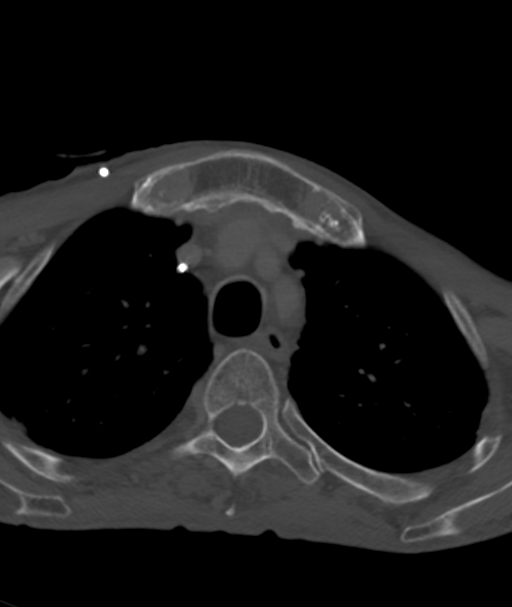
[im 128/171  bone]
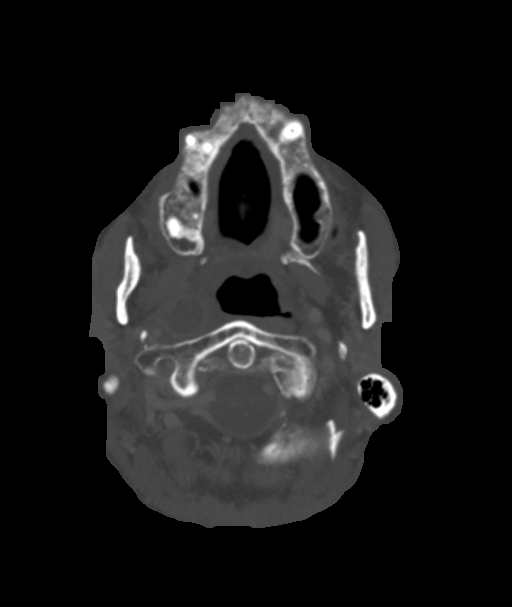

[10 of 35 positions shown; findings below may reference images not displayed]

RADIATION DOSE REDUCTION: This exam was performed according to the
departmental dose-optimization program which includes automated
exposure control, adjustment of the mA and/or kV according to
patient size and/or use of iterative reconstruction technique.

CONTRAST:  60mL OMNIPAQUE IOHEXOL 300 MG/ML  SOLN
FINDINGS: Pharynx and larynx: Similar appearance of post radiation changes.

Salivary glands: Stable appearance with post radiation changes.

Thyroid: Unremarkable.

Lymph nodes: Necrotic right retropharyngeal lymph node is similar in
size. Additional confluent soft tissue with likely enhancing lymph
nodes in the right parapharyngeal/carotid space similar to prior
study. Enhancing small right level 5 node on image thirty-two of
series 3 remains unchanged.

Vascular: Significant atherosclerosis again identified with marked
calcified plaque along the common carotid bifurcations and proximal
internal carotids. There is likely hemodynamically significant
stenosis, right greater than left. Right vertebral artery remains
surrounded by tumor but is patent. Chronic occlusion of the right
internal jugular vein.

Limited intracranial: No new abnormal enhancement.

Visualized orbits: Unremarkable.

Mastoids and visualized paranasal sinuses: No significant
opacification.

Skeleton: Destructive lesion at the right skull base involving
clivus and occipital condyle. Extent has mildly increased. There
remains no apparent significant intracranial extension. Destructive
lesion at the right aspect of C4 with involvement of the posterior
elements as well as a portion of the right aspect of C3. Destructive
lesion at T2 with pathologic compression fracture. There has been
mild progression of destructive change at C3 and C4. Similar
epidural tumor without cord compression.

Upper chest: Dictated separately.

Other: None.
IMPRESSION: Mild progression of destructive lesions at the right skull base and
right aspect of C4 greater than C3. Remains no significant
intracranial extension and epidural disease at the cervical levels
has not substantially progressed.

Stable necrotic right retropharyngeal lymph node. No new adenopathy.

Stable appearance of post radiation changes.

## 2021-06-19 MED ORDER — SODIUM CHLORIDE 0.9 % IV SOLN
150.0000 mg | Freq: Once | INTRAVENOUS | Status: AC
  Administered 2021-06-19: 150 mg via INTRAVENOUS
  Filled 2021-06-19: qty 150

## 2021-06-19 MED ORDER — SODIUM CHLORIDE 0.9 % IV SOLN
10.0000 mg | Freq: Once | INTRAVENOUS | Status: AC
  Administered 2021-06-19: 10 mg via INTRAVENOUS
  Filled 2021-06-19: qty 10

## 2021-06-19 MED ORDER — SODIUM CHLORIDE 0.9 % IV SOLN
Freq: Once | INTRAVENOUS | Status: AC
  Filled 2021-06-19: qty 250

## 2021-06-19 MED ORDER — SODIUM CHLORIDE 0.9 % IV SOLN
200.0000 mg | Freq: Once | INTRAVENOUS | Status: AC
  Administered 2021-06-19: 200 mg via INTRAVENOUS
  Filled 2021-06-19: qty 8

## 2021-06-19 MED ORDER — HEPARIN SOD (PORK) LOCK FLUSH 100 UNIT/ML IV SOLN
INTRAVENOUS | Status: AC
  Filled 2021-06-19: qty 5

## 2021-06-19 MED ORDER — PALONOSETRON HCL INJECTION 0.25 MG/5ML
0.2500 mg | Freq: Once | INTRAVENOUS | Status: AC
  Administered 2021-06-19: 0.25 mg via INTRAVENOUS
  Filled 2021-06-19: qty 5

## 2021-06-19 MED ORDER — IOHEXOL 300 MG/ML  SOLN
60.0000 mL | Freq: Once | INTRAMUSCULAR | Status: AC | PRN
  Administered 2021-06-19: 60 mL via INTRAVENOUS

## 2021-06-19 MED ORDER — OXYCODONE HCL 5 MG PO TABS: 5.0000 mg | ORAL_TABLET | Freq: Three times a day (TID) | ORAL | 0 refills | Status: AC | PRN

## 2021-06-19 MED ORDER — SODIUM CHLORIDE 0.9 % IV SOLN
290.0000 mg | Freq: Once | INTRAVENOUS | Status: AC
  Administered 2021-06-19: 290 mg via INTRAVENOUS
  Filled 2021-06-19: qty 29

## 2021-06-19 MED ORDER — DIPHENHYDRAMINE HCL 50 MG/ML IJ SOLN
50.0000 mg | Freq: Once | INTRAMUSCULAR | Status: AC
  Administered 2021-06-19: 50 mg via INTRAVENOUS
  Filled 2021-06-19: qty 1

## 2021-06-19 NOTE — Progress Notes (Signed)
?Hematology/Oncology Progress note ?[phone] [phone] ?  ? ? ? ?Patient Care Team: ?[name], [name], MD as PCP - General (Internal Medicine) ?Anslie Spadafora, MD as Consulting Physician (Oncology) ? ?REFERRING PROVIDER: ?[name], [name], MD  ?CHIEF COMPLAINTS/REASON FOR VISIT: ? follow up for head and neck cancer ? ?HISTORY OF PRESENTING ILLNESS:  ?History of prostate cancer. 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  PD-L1-IHC 40%, no targetable mutations. ?# 09/07/2019 chemotherapy [cisplatin] and radiation.  ? 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 02/07/20 finished SBRT to stage I squamous cell lung cancer ? ?03/31/2020 CT neck soft tissue as well as chest images were independently reviewed by me and discussed with patient and wife.Interval development of 2.7 x 2.4 soft tissue lesion destroying the right aspect of the T2 vertebral body and inferior aspect of the posterior right second rib.  Interval decrease of the posterior left upper lobe pleural-based nodule with interval decrease in size of the tiny adjacent satellite nodule.  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  No recurrent tumor in right piriform sinus.  Right lateral pharyngeal lymph node is stable.  No new or recurrent adenopathy.Suspect metastasis.  I recommend patient to obtain PET scan restaging.  I discussed about the plan of re-biopsy of either the paraspinal soft tissue mass versus other more feasible hypermetabolic sites detected on PET scan. ?Patient was seen by [name] and opted to proceed with palliative radiation as soon as possible.  PET scan cannot be arranged prior to the radiation so the PET scan was canceled. ?04/20/2020 -04/25/2020, palliative radiation to thoracic spine ? ?05/10/2020, MRI thoracic spine was obtained for  worsening of back pain and left shoulder pain.  Images showed metastatic disease throughout almost the entire T2 with and associated mild superior endplate compression fracture.  Abnormal signal in the anterior, inferior endplate of T1, superior aspect of the T3.  Small metastatic deposit in T12 is also noted. ?Case was also discussed on multidisciplinary tumor board on 05/25/2020 ?05/22/2020, PET scan showed new hypermetabolic lytic bone metastasis in the left scapular near the glenoid and in the T2 vertebral body.  Persistent hypermetabolic right lateral retropharyngeal and right level 3 neck lymph node metastasis.  Peripheral left upper lobe pulmonary nodule is mildly decreased with new surrounding mild platelike postradiation changes..  No residual recurrent neoplasm at the site of vomiting in the right piriform sinus ? ?06/07/2020 -06/09/2020 patient underwent palliative radiation to left scapular ?April 2022 seen by [name] and flexible laryngoscope examination showed new lesion at the right false vocal cord.  Case was discussed on tumor board on 06/22/2020. ?[name] recommend additional CT soft tissue neck with contrast for additional evaluation. ?06/29/2020 CT images were reviewed and discussed with patient. ?There is new malignant lymph node in the right mid neck at the level of the hyoid, this compressing the right jugular vein with thrombus in the vein above and below the lymph node.  Cystic retropharyngeal lymph node on the right is stable.  Progressive soft tissue thickening in the larynx bilaterally suspicious for tumor.  Metastatic disease at the T2 is unchanged.  Metastatic deposit left scapula there is now a pathological fracture of the scapular. ?CT scan findings were discussed with ENT [name] via secure chat.  [name] feels surgery is not feasible.  The larynx lesion currently does not   compromise his airway however further progression may in the future ? ?07/10/2020 Seen by Radonc  [name] who would offer salvage radiation to his larynx as well as neck adenopathy. Would like low dose chemotherapy as sensitizer.  ? ? ?Patient was also referred to establish care with Dr. Georgiann Cocker at [facility] for kyphoplasty.  Patient's wife reports that they were never contacted and she called Dr. Georgiann Cocker office with no success. ? ?# NGS.  PD-L1-IHC 40%, no targetable mutations ?# 07/25/20- 07/27/2020, 08/01/20- 09/06/2020  concurrent chemoradiation [low-dose weekly cisplatin 67m/m2 ] to the larynx ? ?09/15/2020, patient was seen by ENT Dr. MTami Ribas  Laryngoscope examination reviewed clear larynx, no lesion was discovered. ?10/04/2020, started on maintenance Keytruda. ?12/11/2020 restaging CT ?1. Progression of Disease: ?- new since May right C3 destructive osseous metastasis has eroded the right pedicle and facet, with tumor filling the right C4 neural foramen and engulfing the right vertebral artery which remains patent. Mild right lateral epidural tumor. - small (8 mm short axis) but progressed right level 2A nodal disease with evidence of extracapsular extension. ?2. 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  ?3. No recurrent laryngeal or pharyngeal tumor identified. 4. 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 CT Chest, Abdomen, and Pelvis the same day are reported separately. ? ?01/16/2021, patient underwent additional palliative radiation to C-spine. ?02/05/2021 Patient had a fall and went to emergency room on .   ?CT cervical spine without contrast showed stable destructive process in the right side of the T3 vertebral body.  Progressive destructive process in the right side and the posterior element of C4.  No fracture. ? ?03/06/2021, MRI cervical spine with and without contrast showed destructive metastatic lesion at C4 involving the right posterior  elements.  There is mild epidural disease without canal stenosis.  Involvement of right C3-C4 and C4-C5 foraminal.  Abnormal signal at the inferior C3 endplate is probably on degenerative basis.  Destructive metastatic lesion at T2 with pathological fracture. ?03/09/2021 patient was seen by neurosurgeon Dr. YCari Caraway  Based on his current disease burden, dementia, lack of obvious unstable lesion, surgical fixation was not recommended.  Imaging surveillance was recommended and Dr. YCari Carawayis happy to review his imaging over time.  If he has worsening tumor burden, could consider vertebroplasty at T2 or at C4. ?03/20/2021, CT chest abdomen pelvis with contrast showed ?Left upper lobe subpleural nodule[previously treated with radiation] showed interval decrease, with evolving radiation pneumonitis/fibrosis. ?Multiple bilateral cystic pulmonary lesions, increased in size, at least 1 of which with significant wall thickening.  Interval enlargement of a solid nodule of the right upper lobe, consistent with an enlarging metastasis.  New irregular groundglass and consolidation in the dependent right upper lobe, lateral segment right middle lobe, suggesting nonspecific infection or aspiration.  An additional manifestation of malignancy is difficult to exclude.  Emphysema.  CAD. ? ?INTERVAL HISTORY ?Gregory Elzais a 81y.o. male who has above history reviewed by me today presents for follow-up of head and neck cancer and lung cancer ? ?Status post 3 cycles of dose reduced carboplatin /Keytruda.  He tolerates well.  Overall chronic fatigue unchanged.  No nausea vomiting diarrhea.  He has lost weight since last visit. ?He is accompanied by his wife.  Patient takes nutrition supplements. ?+ Right shoulder pain, chronic, patient request some pain medication. ? ?Review of Systems  ?Constitutional:  Positive for fatigue. Negative  for appetite change, chills, fever and unexpected weight change.  ?HENT:   Positive for hearing  loss.   ?Eyes:  Negative for eye problems and icterus.  ?Respiratory:  Negative for chest tightness, cough and shortness of breath.   ?Cardiovascular:  Negative for chest pain and leg swelling.  ?[place]

## 2021-06-19 NOTE — Progress Notes (Signed)
Nutrition Follow-up: ? ?Patient with metastatic head and neck cancer.  Receiving keytruda and carboplatin.   ? ?Met with wife while patient was in infusion.  Patient with dementia.  Wife reports that she has been giving patient boost shakes.  Drinks about 2 a day.  Says that patient is still eating. Loves applesauce, mashed potatoes, chicken.   ? ? ? ?Medications: reviewed ? ?Labs: reviewed ? ?Anthropometrics:  ? ?Weight 96 lb 14.4 oz ? ?100 lb 6.4 oz on 2/21 ?101 lb 4.8 oz on 1/6 ?103 lb 8 oz on 11/2 ?107 lb on 10/12 ? ? ?NUTRITION DIAGNOSIS: Inadequate oral intake ongoing with weight loss ? ? ?INTERVENTION:  ?Recommend boost VHC shakes (530 calories, 22 g protein). Can be order on [employer].  Wife says she will have son order them for her.   ?Can continue ensure shakes as well.  Complimentary case given to wife today. ?Reviewed ways to add calories and protein in diet with wife today.  ?  ? ?MONITORING, EVALUATION, GOAL: weight trends, intake ? ? ?NEXT VISIT: Tuesday, May 16 during infusion with wife ? ?Chenae Brager B. Zenia Resides, RD, LDN ?Registered Dietitian ?[phone] ? ? ?

## 2021-06-19 NOTE — Patient Instructions (Signed)
[facility] AT [place]  Discharge Instructions: ?Thank you for choosing [facility] to provide your oncology and hematology care.  ?If you have a lab appointment with the [facility], please go directly to the [facility] and check in at the registration area. ? ?Wear comfortable clothing and clothing appropriate for easy access to any Portacath or PICC line.  ? ?We strive to give you quality time with your provider. You may need to reschedule your appointment if you arrive late (15 or more minutes).  Arriving late affects you and other patients whose appointments are after yours.  Also, if you miss three or more appointments without notifying the office, you may be dismissed from the clinic at the provider?s discretion.    ?  ?For prescription refill requests, have your pharmacy contact our office and allow 72 hours for refills to be completed.   ? ?Today you received the following chemotherapy and/or immunotherapy agents Keytruda, carboplatin  ?  ?To help prevent nausea and vomiting after your treatment, we encourage you to take your nausea medication as directed. ? ?BELOW ARE SYMPTOMS THAT SHOULD BE REPORTED IMMEDIATELY: ?*FEVER GREATER THAN 100.4 F (38 ?C) OR HIGHER ?*CHILLS OR SWEATING ?*NAUSEA AND VOMITING THAT IS NOT CONTROLLED WITH YOUR NAUSEA MEDICATION ?*UNUSUAL SHORTNESS OF BREATH ?*UNUSUAL BRUISING OR BLEEDING ?*URINARY PROBLEMS (pain or burning when urinating, or frequent urination) ?*BOWEL PROBLEMS (unusual diarrhea, constipation, pain near the anus) ?TENDERNESS IN MOUTH AND THROAT WITH OR WITHOUT PRESENCE OF ULCERS (sore throat, sores in mouth, or a toothache) ?UNUSUAL RASH, SWELLING OR PAIN  ?UNUSUAL VAGINAL DISCHARGE OR ITCHING  ? ?Items with * indicate a potential emergency and should be followed up as soon as possible or go to the Emergency Department if any problems should occur. ? ?Please show the CHEMOTHERAPY ALERT CARD or IMMUNOTHERAPY ALERT CARD at  check-in to the Emergency Department and triage nurse. ? ?Should you have questions after your visit or need to cancel or reschedule your appointment, please contact [facility] [place] AT [place]  [phone] and follow the prompts.  Office hours are 8:00 a.m. to 4:30 p.m. Monday - Friday. Please note that voicemails left after 4:00 p.m. may not be returned until the following business day.  We are closed weekends and major holidays. You have access to a nurse at all times for urgent questions. Please call the main number to the clinic [phone] and follow the prompts. ? ?For any non-urgent questions, you may also contact your provider using MyChart. We now offer e-Visits for anyone 55 and older to request care online for non-urgent symptoms. For details visit [email or web address]. ?  ?Also download the MyChart app! Go to the app store, search "MyChart", open the app, select [facility], and log in with your MyChart username and password. ? ?Due to Covid, a mask is required upon entering the hospital/clinic. If you do not have a mask, one will be given to you upon arrival. For doctor visits, patients may have 1 support person aged 55 or older with them. For treatment visits, patients cannot have anyone with them due to current Covid guidelines and our immunocompromised population.  ?

## 2021-07-04 ENCOUNTER — Other Ambulatory Visit: Payer: Self-pay | Admitting: Oncology

## 2021-07-05 ENCOUNTER — Encounter: Payer: Self-pay | Admitting: Oncology

## 2021-07-09 MED FILL — Dexamethasone Sodium Phosphate Inj 100 MG/10ML: INTRAMUSCULAR | Qty: 1 | Status: AC

## 2021-07-09 MED FILL — Fosaprepitant Dimeglumine For IV Infusion 150 MG (Base Eq): INTRAVENOUS | Qty: 5 | Status: AC

## 2021-07-10 ENCOUNTER — Inpatient Hospital Stay: Payer: Medicare Other

## 2021-07-10 ENCOUNTER — Inpatient Hospital Stay (HOSPITAL_BASED_OUTPATIENT_CLINIC_OR_DEPARTMENT_OTHER): Payer: Medicare Other | Admitting: Oncology

## 2021-07-10 ENCOUNTER — Inpatient Hospital Stay: Payer: Medicare Other | Attending: Oncology

## 2021-07-10 ENCOUNTER — Encounter: Payer: Self-pay | Admitting: Oncology

## 2021-07-10 VITALS — BP 131/79 | HR 70 | Temp 98.7°F | Resp 20 | Wt 92.5 lb

## 2021-07-10 DIAGNOSIS — Z79899 Other long term (current) drug therapy: Secondary | ICD-10-CM | POA: Diagnosis not present

## 2021-07-10 DIAGNOSIS — C3492 Malignant neoplasm of unspecified part of left bronchus or lung: Secondary | ICD-10-CM

## 2021-07-10 DIAGNOSIS — Z5111 Encounter for antineoplastic chemotherapy: Secondary | ICD-10-CM

## 2021-07-10 DIAGNOSIS — C109 Malignant neoplasm of oropharynx, unspecified: Secondary | ICD-10-CM

## 2021-07-10 DIAGNOSIS — Z5112 Encounter for antineoplastic immunotherapy: Secondary | ICD-10-CM

## 2021-07-10 DIAGNOSIS — C329 Malignant neoplasm of larynx, unspecified: Secondary | ICD-10-CM | POA: Diagnosis present

## 2021-07-10 LAB — CBC WITH DIFFERENTIAL/PLATELET
Abs Immature Granulocytes: 0.03 10*3/uL (ref 0.00–0.07)
Basophils Absolute: 0 10*3/uL (ref 0.0–0.1)
Basophils Relative: 0 %
Eosinophils Absolute: 0.1 10*3/uL (ref 0.0–0.5)
Eosinophils Relative: 1 %
HCT: 34.3 % — ABNORMAL LOW (ref 39.0–52.0)
Hemoglobin: 10.9 g/dL — ABNORMAL LOW (ref 13.0–17.0)
Immature Granulocytes: 1 %
Lymphocytes Relative: 11 %
Lymphs Abs: 0.7 10*3/uL (ref 0.7–4.0)
MCH: 30.7 pg (ref 26.0–34.0)
MCHC: 31.8 g/dL (ref 30.0–36.0)
MCV: 96.6 fL (ref 80.0–100.0)
Monocytes Absolute: 0.7 10*3/uL (ref 0.1–1.0)
Monocytes Relative: 11 %
Neutro Abs: 4.5 10*3/uL (ref 1.7–7.7)
Neutrophils Relative %: 76 %
Platelets: 196 10*3/uL (ref 150–400)
RBC: 3.55 MIL/uL — ABNORMAL LOW (ref 4.22–5.81)
RDW: 16 % — ABNORMAL HIGH (ref 11.5–15.5)
WBC: 6 10*3/uL (ref 4.0–10.5)
nRBC: 0 % (ref 0.0–0.2)

## 2021-07-10 LAB — COMPREHENSIVE METABOLIC PANEL
ALT: 17 U/L (ref 0–44)
AST: 22 U/L (ref 15–41)
Albumin: 3.4 g/dL — ABNORMAL LOW (ref 3.5–5.0)
Alkaline Phosphatase: 101 U/L (ref 38–126)
Anion gap: 11 (ref 5–15)
BUN: 27 mg/dL — ABNORMAL HIGH (ref 8–23)
CO2: 27 mmol/L (ref 22–32)
Calcium: 9.3 mg/dL (ref 8.9–10.3)
Chloride: 109 mmol/L (ref 98–111)
Creatinine, Ser: 0.83 mg/dL (ref 0.61–1.24)
GFR, Estimated: >60 mL/min (ref >60)
Glucose, Bld: 122 mg/dL — ABNORMAL HIGH (ref 70–99)
Potassium: 3.8 mmol/L (ref 3.5–5.1)
Sodium: 147 mmol/L — ABNORMAL HIGH (ref 135–145)
Total Bilirubin: 0.6 mg/dL (ref 0.3–1.2)
Total Protein: 7.6 g/dL (ref 6.5–8.1)

## 2021-07-10 MED ORDER — HEPARIN SOD (PORK) LOCK FLUSH 100 UNIT/ML IV SOLN
500.0000 [IU] | Freq: Once | INTRAVENOUS | Status: AC | PRN
  Administered 2021-07-10: 500 [IU]
  Filled 2021-07-10: qty 5

## 2021-07-10 MED ORDER — SODIUM CHLORIDE 0.9 % IV SOLN
Freq: Once | INTRAVENOUS | Status: AC
  Filled 2021-07-10: qty 250

## 2021-07-10 MED ORDER — SODIUM CHLORIDE 0.9 % IV SOLN
10.0000 mg | Freq: Once | INTRAVENOUS | Status: AC
  Administered 2021-07-10: 10 mg via INTRAVENOUS
  Filled 2021-07-10: qty 10

## 2021-07-10 MED ORDER — SODIUM CHLORIDE 0.9 % IV SOLN
200.0000 mg | Freq: Once | INTRAVENOUS | Status: AC
  Administered 2021-07-10: 200 mg via INTRAVENOUS
  Filled 2021-07-10: qty 8

## 2021-07-10 NOTE — Progress Notes (Signed)
Nutrition Follow-up: ? ?Patient with metastatic head and neck cancer.  Patient receiving [place].  ? ?Met with wife while patient was in infusion.  Wife reports that patient's appetite is so-so.  Son was able to purchase boost VHC shakes and patient is drinking 2 a day.  Will also drink ensure plus as well.  Likes ice cream.  Usually eats a good breakfast of 2 eggs, cheese toast, juice and applesauce.  Likes beef stew over toast with margarine for lunch.  Wife usually prepares potatoes with gravy (patient's favorite) for dinner and has vegetables and meats.   ? ? ? ?Medications: reviewed ? ?Labs: reviewed ? ?Anthropometrics:  ? ?Weight 92 lb today ? ?96 lb on 4/25 ?100 lb on 2/21 ?101 lb on 1/6 ?103 lb on 11/2 ?107 lb on 10/12 ? ? ?NUTRITION DIAGNOSIS: Inadequate oral intake continues with weight loss ? ? ?INTERVENTION:  ?Encouraged boost VHC as often as able to drink ?Continue high calorie, high protein foods.  Wife is utilizing strategies that RD has already discussed to add calories and protein to foods.   ?  ? ?MONITORING, EVALUATION, GOAL: weight trends, intake ? ? ?NEXT VISIT: as needed ? ?Alnisa Hasley B. Zenia Resides, RD, LDN ?Registered Dietitian ?[phone] [id number] ? ? ?

## 2021-07-10 NOTE — Addendum Note (Signed)
Addended by: Evelina Dun on: 07/10/2021 03:05 PM ? ? Modules accepted: Orders ? ?

## 2021-07-10 NOTE — Patient Instructions (Signed)
[facility] CANCER CTR AT [place]  Discharge Instructions: ?Thank you for choosing [facility] to provide your oncology and hematology care.  ?If you have a lab appointment with the [facility], please go directly to the [facility] and check in at the registration area. ? ?Wear comfortable clothing and clothing appropriate for easy access to any Portacath or PICC line.  ? ?We strive to give you quality time with your provider. You may need to reschedule your appointment if you arrive late (15 or more minutes).  Arriving late affects you and other patients whose appointments are after yours.  Also, if you miss three or more appointments without notifying the office, you may be dismissed from the clinic at the provider?s discretion.    ?  ?For prescription refill requests, have your pharmacy contact our office and allow 72 hours for refills to be completed.   ? ?Today you received the following chemotherapy and/or immunotherapy agents: Keytruda    ?  ?To help prevent nausea and vomiting after your treatment, we encourage you to take your nausea medication as directed. ? ?BELOW ARE SYMPTOMS THAT SHOULD BE REPORTED IMMEDIATELY: ?*FEVER GREATER THAN 100.4 F (38 ?C) OR HIGHER ?*CHILLS OR SWEATING ?*NAUSEA AND VOMITING THAT IS NOT CONTROLLED WITH YOUR NAUSEA MEDICATION ?*UNUSUAL SHORTNESS OF BREATH ?*UNUSUAL BRUISING OR BLEEDING ?*URINARY PROBLEMS (pain or burning when urinating, or frequent urination) ?*BOWEL PROBLEMS (unusual diarrhea, constipation, pain near the anus) ?TENDERNESS IN MOUTH AND THROAT WITH OR WITHOUT PRESENCE OF ULCERS (sore throat, sores in mouth, or a toothache) ?UNUSUAL RASH, SWELLING OR PAIN  ?UNUSUAL VAGINAL DISCHARGE OR ITCHING  ? ?Items with * indicate a potential emergency and should be followed up as soon as possible or go to the Emergency Department if any problems should occur. ? ?Please show the CHEMOTHERAPY ALERT CARD or IMMUNOTHERAPY ALERT CARD at check-in to  the Emergency Department and triage nurse. ? ?Should you have questions after your visit or need to cancel or reschedule your appointment, please contact [facility] CANCER [place] AT [place]  [phone] and follow the prompts.  Office hours are 8:00 a.m. to 4:30 p.m. Monday - Friday. Please note that voicemails left after 4:00 p.m. may not be returned until the following business day.  We are closed weekends and major holidays. You have access to a nurse at all times for urgent questions. Please call the main number to the clinic [phone] and follow the prompts. ? ?For any non-urgent questions, you may also contact your provider using MyChart. We now offer e-Visits for anyone 16 and older to request care online for non-urgent symptoms. For details visit [email or web address]. ?  ?Also download the MyChart app! Go to the app store, search "MyChart", open the app, select [facility], and log in with your MyChart username and password. ? ?Due to Covid, a mask is required upon entering the hospital/clinic. If you do not have a mask, one will be given to you upon arrival. For doctor visits, patients may have 1 support person aged 81 or older with them. For treatment visits, patients cannot have anyone with them due to current Covid guidelines and our immunocompromised population.  ?

## 2021-07-10 NOTE — Progress Notes (Signed)
?Hematology/Oncology Progress note ?[phone] [phone] ?  ? ? ? ?Patient Care Team: ?Casilda Carls, MD as PCP - General (Internal Medicine) ?Earlie Server, MD as Consulting Physician (Oncology) ? ?REFERRING PROVIDER: ?Casilda Carls, MD  ?CHIEF COMPLAINTS/REASON FOR VISIT: ? follow up for head and neck cancer ? ?HISTORY OF PRESENTING ILLNESS:  ?History of prostate cancer. 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  PD-L1-IHC 40%, no targetable mutations. ?# 09/07/2019 chemotherapy [cisplatin] and radiation.  ? 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 02/07/20 finished SBRT to stage I squamous cell lung cancer ? ?03/31/2020 CT neck soft tissue as well as chest images were independently reviewed by me and discussed with patient and wife.Interval development of 2.7 x 2.4 soft tissue lesion destroying the right aspect of the T2 vertebral body and inferior aspect of the posterior right second rib.  Interval decrease of the posterior left upper lobe pleural-based nodule with interval decrease in size of the tiny adjacent satellite nodule.  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  No recurrent tumor in right piriform sinus.  Right lateral pharyngeal lymph node is stable.  No new or recurrent adenopathy.Suspect metastasis.  I recommend patient to obtain PET scan restaging.  I discussed about the plan of re-biopsy of either the paraspinal soft tissue mass versus other more feasible hypermetabolic sites detected on PET scan. ?Patient was seen by Dr. Kennyth Lose and opted to proceed with palliative radiation as soon as possible.  PET scan cannot be arranged prior to the radiation so the PET scan was canceled. ?04/20/2020 -04/25/2020, palliative radiation to thoracic spine ? ?05/10/2020, MRI thoracic spine was obtained for  worsening of back pain and left shoulder pain.  Images showed metastatic disease throughout almost the entire T2 with and associated mild superior endplate compression fracture.  Abnormal signal in the anterior, inferior endplate of T1, superior aspect of the T3.  Small metastatic deposit in T12 is also noted. ?Case was also discussed on multidisciplinary tumor board on 05/25/2020 ?05/22/2020, PET scan showed new hypermetabolic lytic bone metastasis in the left scapular near the glenoid and in the T2 vertebral body.  Persistent hypermetabolic right lateral retropharyngeal and right level 3 neck lymph node metastasis.  Peripheral left upper lobe pulmonary nodule is mildly decreased with new surrounding mild platelike postradiation changes..  No residual recurrent neoplasm at the site of vomiting in the right piriform sinus ? ?06/07/2020 -06/09/2020 patient underwent palliative radiation to left scapular ?April 2022 seen by [name] and flexible laryngoscope examination showed new lesion at the right false vocal cord.  Case was discussed on tumor board on 06/22/2020. ?[name] recommend additional CT soft tissue neck with contrast for additional evaluation. ?06/29/2020 CT images were reviewed and discussed with patient. ?There is new malignant lymph node in the right mid neck at the level of the hyoid, this compressing the right jugular vein with thrombus in the vein above and below the lymph node.  Cystic retropharyngeal lymph node on the right is stable.  Progressive soft tissue thickening in the larynx bilaterally suspicious for tumor.  Metastatic disease at the T2 is unchanged.  Metastatic deposit left scapula there is now a pathological fracture of the scapular. ?CT scan findings were discussed with ENT [name] via secure chat.  [name] feels surgery is not feasible.  The larynx lesion currently does not  compromise his airway however further progression may in the future ? ?07/10/2020 Seen by Radonc  [name] who would offer salvage radiation to his larynx as well as neck adenopathy. Would like low dose chemotherapy as sensitizer.  ? ? ?Patient was also referred to establish care with [name] at [facility] for kyphoplasty.  Patient's wife reports that they were never contacted and she called [name] office with no success. ? ?# NGS.  PD-L1-IHC 40%, no targetable mutations ?# 07/25/20- 07/27/2020, 08/01/20- 09/06/2020  concurrent chemoradiation [low-dose weekly cisplatin 30mg/m2 ] to the larynx ? ?09/15/2020, patient was seen by ENT [name].  Laryngoscope examination reviewed clear larynx, no lesion was discovered. ?10/04/2020, started on maintenance Keytruda. ?12/11/2020 restaging CT ?1. Progression of Disease: ?- new since May right C3 destructive osseous metastasis has eroded the right pedicle and facet, with tumor filling the right C4 neural foramen and engulfing the right vertebral artery which remains patent. Mild right lateral epidural tumor. - small (8 mm short axis) but progressed right level 2A nodal disease with evidence of extracapsular extension. ?2. 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  ?3. No recurrent laryngeal or pharyngeal tumor identified. 4. 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 CT Chest, Abdomen, and Pelvis the same day are reported separately. ? ?01/16/2021, patient underwent additional palliative radiation to C-spine. ?02/05/2021 Patient had a fall and went to emergency room on .   ?CT cervical spine without contrast showed stable destructive process in the right side of the T3 vertebral body.  Progressive destructive process in the right side and the posterior element of C4.  No fracture. ? ?03/06/2021, MRI cervical spine with and without contrast showed destructive metastatic lesion at C4 involving the right posterior  elements.  There is mild epidural disease without canal stenosis.  Involvement of right C3-C4 and C4-C5 foraminal.  Abnormal signal at the inferior C3 endplate is probably on degenerative basis.  Destructive metastatic lesion at T2 with pathological fracture. ?03/09/2021 patient was seen by neurosurgeon Dr. Yarborough.  Based on his current disease burden, dementia, lack of obvious unstable lesion, surgical fixation was not recommended.  Imaging surveillance was recommended and Dr. Yarborough is happy to review his imaging over time.  If he has worsening tumor burden, could consider vertebroplasty at T2 or at C4. ?03/20/2021, CT chest abdomen pelvis with contrast showed ?Left upper lobe subpleural nodule[previously treated with radiation] showed interval decrease, with evolving radiation pneumonitis/fibrosis. ?Multiple bilateral cystic pulmonary lesions, increased in size, at least 1 of which with significant wall thickening.  Interval enlargement of a solid nodule of the right upper lobe, consistent with an enlarging metastasis.  New irregular groundglass and consolidation in the dependent right upper lobe, lateral segment right middle lobe, suggesting nonspecific infection or aspiration.  An additional manifestation of malignancy is difficult to exclude.  Emphysema.  CAD. ? ?INTERVAL HISTORY ?Alastor Ziemann is a 80 y.o. male who has above history reviewed by me today presents for follow-up of head and neck cancer and lung cancer ? ?Status post 4 cycles of dose reduced carboplatin /Keytruda.  ?Patient has had  CT done during interval and present to discuss results.  Accompanied by wife.  Patient has been started on boost nutrition supplements for about a week. ?Patient has lost 4 pounds since last visit.  He had a fall on 06/20/2021.  Landed on his butt.  Denies any   bone pain today. ?+ weakness.  No fever, chills, nausea vomiting.  He denies neck pain. ? ? ? ?Review of Systems  ?Constitutional:  Positive for fatigue.  Negative for appetite change, chills, fever and unexpected weight change.  ?HENT:   Positive for hearing loss.   ?Eyes:  Negative for eye problems and icterus.  ?Respiratory:  Negative for chest tightness, coug

## 2021-07-16 ENCOUNTER — Ambulatory Visit: Payer: Medicare Other | Admitting: Radiation Oncology

## 2021-07-16 ENCOUNTER — Inpatient Hospital Stay: Payer: Medicare Other

## 2021-07-17 ENCOUNTER — Other Ambulatory Visit: Payer: Self-pay | Admitting: Unknown Physician Specialty

## 2021-07-17 DIAGNOSIS — G510 Bell's palsy: Secondary | ICD-10-CM

## 2021-07-18 ENCOUNTER — Encounter
Admission: RE | Admit: 2021-07-18 | Discharge: 2021-07-18 | Disposition: A | Discharge status: Home / Self Care | Payer: Medicare Other | Source: Ambulatory Visit | Attending: Internal Medicine | Admitting: Internal Medicine

## 2021-07-18 ENCOUNTER — Inpatient Hospital Stay: Payer: Medicare Other

## 2021-07-18 DIAGNOSIS — R911 Solitary pulmonary nodule: Secondary | ICD-10-CM | POA: Insufficient documentation

## 2021-07-18 DIAGNOSIS — C3492 Malignant neoplasm of unspecified part of left bronchus or lung: Secondary | ICD-10-CM | POA: Insufficient documentation

## 2021-07-18 DIAGNOSIS — C109 Malignant neoplasm of oropharynx, unspecified: Secondary | ICD-10-CM | POA: Diagnosis present

## 2021-07-18 DIAGNOSIS — I2510 Atherosclerotic heart disease of native coronary artery without angina pectoris: Secondary | ICD-10-CM | POA: Insufficient documentation

## 2021-07-18 DIAGNOSIS — C7951 Secondary malignant neoplasm of bone: Secondary | ICD-10-CM | POA: Insufficient documentation

## 2021-07-18 DIAGNOSIS — I700 Atherosclerosis of aorta: Secondary | ICD-10-CM | POA: Insufficient documentation

## 2021-07-18 DIAGNOSIS — M47816 Spondylosis without myelopathy or radiculopathy, lumbar region: Secondary | ICD-10-CM | POA: Diagnosis not present

## 2021-07-18 IMAGING — PT NM PET TUM IMG RESTAG (PS) SKULL BASE T - THIGH
6 series · 25 of 25 positions shown · non-contrast
Comparison: Multiple exams, including CT scans of [DATE] and
prior PET-CT from [DATE]

CLINICAL DATA: Subsequent treatment strategy for squamous cell
carcinoma the left lung. Oropharyngeal cancer.

EXAM:
NUCLEAR MEDICINE PET SKULL BASE TO THIGH
TECHNIQUE: 5.7 mCi F-18 FDG was injected intravenously. Full-ring PET imaging
was performed from the skull base to thigh after the radiotracer. CT
data was obtained and used for attenuation correction and anatomic
localization.
Fasting blood glucose: 90 mg/dl

[Series 2: ct slices · axial · 3.8mm · 1.37mm/px · z∈[-850,+6]mm · 6 of 263 slices shown]
[im 1/263]
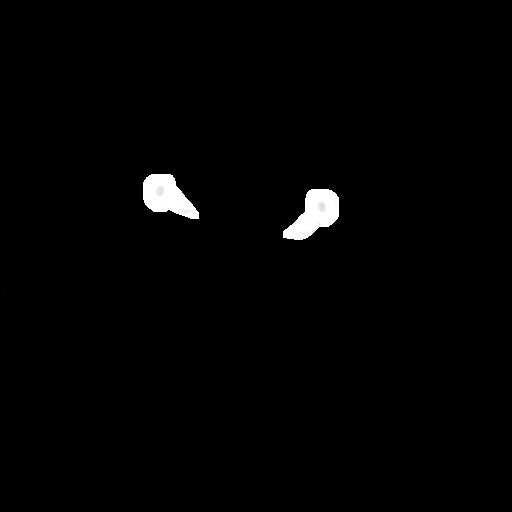
[im 53/263]
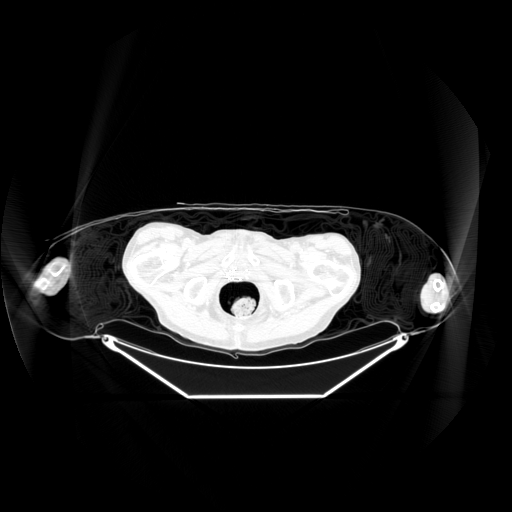
[im 105/263]
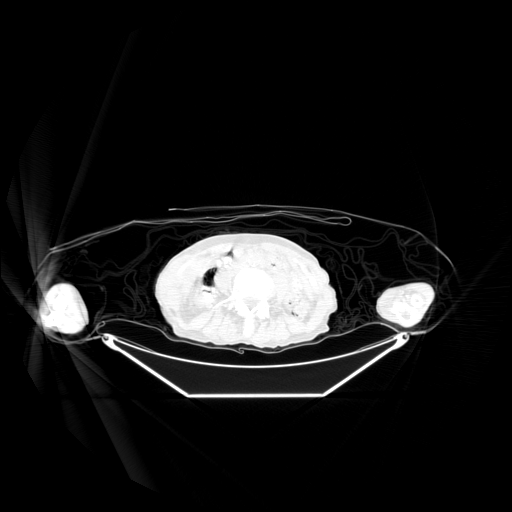
[im 158/263]
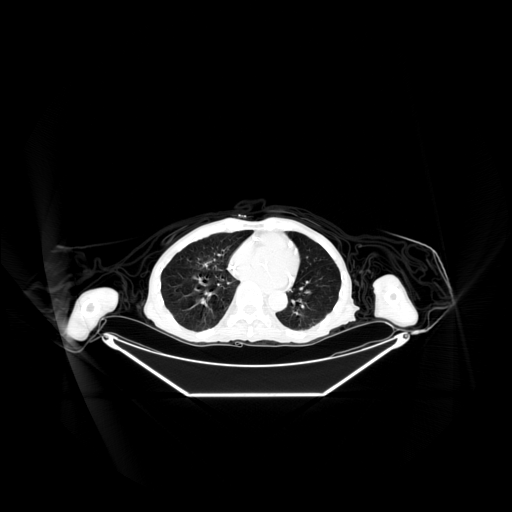
[im 210/263]
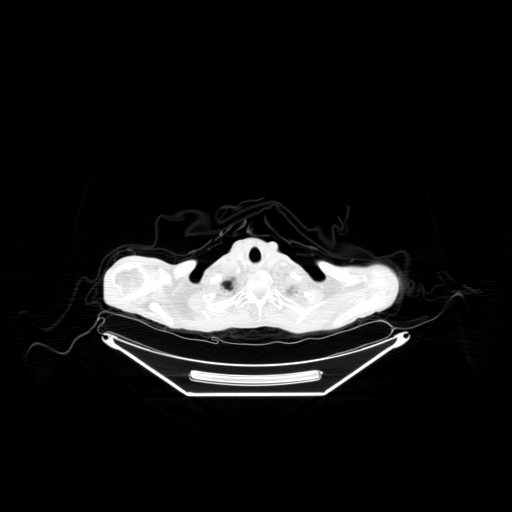
[im 263/263]
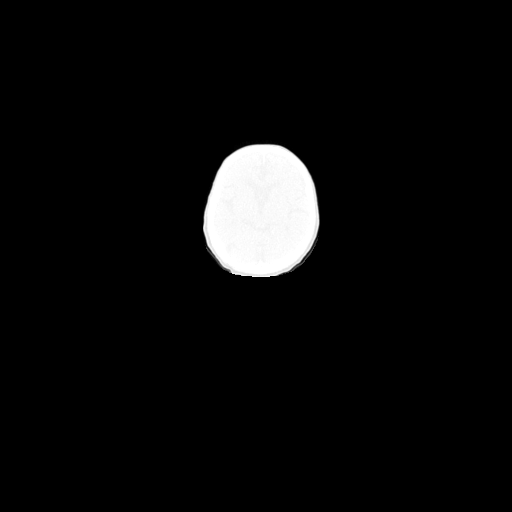

[Series 3: pet ac 3d body · axial · 3.3mm · 5.47mm/px · z∈[-850,+7]mm · 5 of 263 slices shown]
[im 1/263]
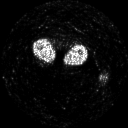
[im 66/263]
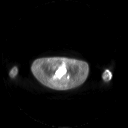
[im 132/263]
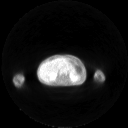
[im 197/263]
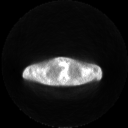
[im 263/263]
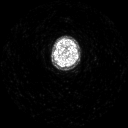

[Series 4: pet nac 3d body · axial · 3.3mm · 5.47mm/px · z∈[-850,+7]mm · 5 of 263 slices shown]
[im 1/263]
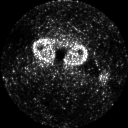
[im 66/263]
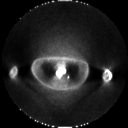
[im 132/263]
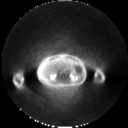
[im 197/263]
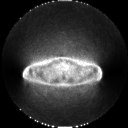
[im 263/263]
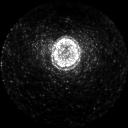

[Series 303: pet axial · axial · 3.3mm · 5.47mm/px · z∈[-850,+7]mm · 5 of 262 slices shown]
[im 1/262]
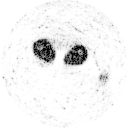
[im 66/262]
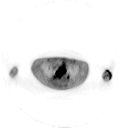
[im 131/262]
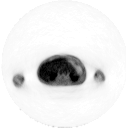
[im 196/262]
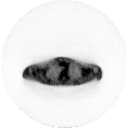
[im 262/262]
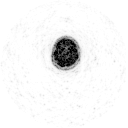

[Series 304: pet sagittal · sagittal · 5.5mm · 6.88mm/px · 2 of 115 slices shown]
[im 1/115]
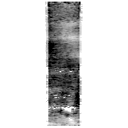
[im 115/115]
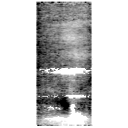

[Series 305: pet coronal · coronal · 5.5mm · 6.88mm/px · 2 of 77 slices shown]
[im 1/77]
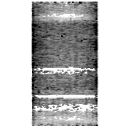
[im 77/77]
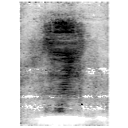

[25 of 25 positions shown; findings below may reference images not displayed]

FINDINGS: Mediastinal blood pool activity: SUV max

Liver activity: SUV max NA

NECK: Centrally necrotic right retropharyngeal lymph node anterior
to the arch of C1, maximum SUV 4.6 (formerly 3.3).

A small right level IIb lymph node 0.4 cm in short axis on image 28
series 2, maximum SUV 5.2 (formerly 2.4).

Tiny focus of activity in the vicinity of the left cricoid
lamina/left inferior constrictor, maximum SUV 4.0, no CT correlate.

Incidental CT findings: Dense bilateral carotid atherosclerotic
calcification.

CHEST: Anteromedial right upper lobe nodule 1.8 by 0.8 cm on image
91 of series 2 with maximum SUV 2.1, similar morphology to the
[DATE] exam, and not present on the prior PET-CT.

The scarring and local airspace opacity spanning across the major
fissure in the left upper lobe and left lower lobe laterally has a
maximum SUV of about 1.4, previously 3.0.

The clustered ground-glass opacity and tree-in-bud reticulonodular
opacities in the right middle lobe are not substantially
hypermetabolic. Similar opacities posteriorly in the right upper
lobe are not substantially hypermetabolic and are likely
inflammatory.

Incidental CT findings: Right Port-A-Cath tip: Right atrium.
Coronary, aortic arch, and branch vessel atherosclerotic vascular
disease.

ABDOMEN/PELVIS: No significant abnormal hypermetabolic activity in
this region.

Incidental CT findings: Atherosclerosis is present, including
aortoiliac atherosclerotic disease. Sigmoid colon diverticulosis.
Brachytherapy seed implants in the prostate gland.

Incidental finding of FDG extravasation along the left distal
forearm.

SKELETON: Hypermetabolic mass involving the right occipital bone and
clivus with roughly similar appearance to [DATE], maximum SUV 8.9.
This lesion is new from the prior PET-CT.

New lytic lesion of the right C3 and C4 vertebra compared to prior
PET-CT, maximum SUV 9.2.

Other remote osseous lesions such as the right lytic lesion at T2
are not currently hypermetabolic.

Incidental CT findings: Lumbar spondylosis and degenerative disc
disease.
IMPRESSION: 1. Currently active osseous metastatic disease in the right skull
base, along with a new substantial lytic lesion of the right side of
the C3 and C4 vertebra. Older bony metastatic lesions are not
currently hypermetabolic.
2. Currently in the neck the centrally necrotic right
retropharyngeal lymph node has maximum SUV of 4.6 (formerly 3.3) and
a small right level IIb lymph node has a maximum SUV of 5.2.
3. Tiny focus of activity in the vicinity of the left inferior
constrictor/left cricoid lamina, no CT correlate.
4. The recently seen right upper lobe medial nodule has a maximum
SUV of 2.1, nonspecific for malignant versus inflammatory lesion. No
other activity concerning for active malignancy in the chest,
abdomen, or pelvis.
5. Other imaging findings of potential clinical significance: Aortic
Atherosclerosis ([GT]-[GT]). Coronary atherosclerosis. Lumbar
spondylosis and degenerative disc disease.

## 2021-07-18 MED ORDER — FLUDEOXYGLUCOSE F - 18 (FDG) INJECTION
5.2000 | Freq: Once | INTRAVENOUS | Status: AC | PRN
  Administered 2021-07-18: 5.69 via INTRAVENOUS

## 2021-07-19 ENCOUNTER — Ambulatory Visit
Admission: RE | Admit: 2021-07-19 | Discharge: 2021-07-19 | Disposition: A | Discharge status: Home / Self Care | Payer: Medicare Other | Source: Ambulatory Visit | Attending: Radiation Oncology | Admitting: Radiation Oncology

## 2021-07-19 ENCOUNTER — Inpatient Hospital Stay: Payer: Medicare Other

## 2021-07-19 VITALS — BP 138/75 | HR 72 | Temp 97.0°F | Ht 69.0 in | Wt 96.0 lb

## 2021-07-19 DIAGNOSIS — C01 Malignant neoplasm of base of tongue: Secondary | ICD-10-CM | POA: Insufficient documentation

## 2021-07-19 DIAGNOSIS — C779 Secondary and unspecified malignant neoplasm of lymph node, unspecified: Secondary | ICD-10-CM | POA: Diagnosis not present

## 2021-07-19 DIAGNOSIS — C7951 Secondary malignant neoplasm of bone: Secondary | ICD-10-CM | POA: Insufficient documentation

## 2021-07-19 DIAGNOSIS — C7839 Secondary malignant neoplasm of other respiratory organs: Secondary | ICD-10-CM | POA: Insufficient documentation

## 2021-07-19 DIAGNOSIS — R911 Solitary pulmonary nodule: Secondary | ICD-10-CM

## 2021-07-19 DIAGNOSIS — R221 Localized swelling, mass and lump, neck: Secondary | ICD-10-CM

## 2021-07-19 DIAGNOSIS — C3412 Malignant neoplasm of upper lobe, left bronchus or lung: Secondary | ICD-10-CM | POA: Insufficient documentation

## 2021-07-19 NOTE — Progress Notes (Signed)
Radiation Oncology Follow up Note  Name: Gregory Burton   Date:   07/19/2021 MRN:  [id number] DOB: Mar 12, 1940    This 81 y.o. 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  He was noted on recent scans have a medial right upper lobe lung nodule which is hypermetabolic on PET scan.  Unfortunately PET scan shows a marked recurrence in the cervical spine region he has developed a right facial drop.  He is in no significant pain at this time..  COMPLICATIONS OF TREATMENT: none  FOLLOW UP COMPLIANCE: keeps appointments   PHYSICAL EXAM:  BP 138/75   Pulse 72   Temp (!) 97 F (36.1 C)   Ht 5\' 9"  (1.753 m)   Wt 96 lb (43.5 kg)   BMI 14.18 kg/m  [place] male with obvious right facial drop in NAD.  Well-developed well-nourished patient in NAD. HEENT reveals PERLA, EOMI, discs not visualized.  Oral cavity is clear. No oral mucosal lesions are identified. Neck is clear without evidence of cervical or supraclavicular adenopathy. Lungs are clear to A&P. Cardiac examination is essentially unremarkable with regular rate and rhythm without murmur rub or thrill. Abdomen is benign with no organomegaly or masses noted. Motor sensory and DTR levels are equal and symmetric in the upper and lower extremities. Cranial nerves II through XII are grossly intact. Proprioception is intact. No peripheral adenopathy or edema is identified. No motor or  sensory levels are noted. Crude visual fields are within normal range.  RADIOLOGY RESULTS: PET CT scan reviewed compatible with above-stated findings  PLAN: At this time the small nodule in his lung which is hypermetabolic is of not a paramount importance.  We have treated to a maximum dose his cervical spine and he still persists with hypermetabolic activity consistent with recurrence in that area.  Based on his frail appearance I would suggest palliative care at this time and have made a referral to [facility] for that.  I believe all doctors in agreement that palliative care hospice is in order at this time.  I like to thank you for allowing [place] participate in his unfortunate patient's care.      Noreene Filbert, MD

## 2021-07-20 ENCOUNTER — Other Ambulatory Visit: Payer: Self-pay

## 2021-07-20 ENCOUNTER — Telehealth: Payer: Self-pay | Admitting: *Deleted

## 2021-07-20 DIAGNOSIS — C3492 Malignant neoplasm of unspecified part of left bronchus or lung: Secondary | ICD-10-CM

## 2021-07-20 DIAGNOSIS — Z95828 Presence of other vascular implants and grafts: Secondary | ICD-10-CM

## 2021-07-20 LAB — GLUCOSE, CAPILLARY: Glucose-Capillary: 90 mg/dL (ref 70–99)

## 2021-07-20 NOTE — Telephone Encounter (Signed)
Call from nurse McKenzie at [facility] where patient is scheduled for MRI Brain 08/03/21 to get orders entered into computer for him to have his port accessed, flushed, and de accessed for scan. Please enter orders

## 2021-07-20 NOTE — Telephone Encounter (Signed)
Spoke to [facility]. Order for port access and de acess have been placed.

## 2021-07-30 ENCOUNTER — Telehealth: Payer: Self-pay

## 2021-07-30 ENCOUNTER — Inpatient Hospital Stay: Payer: Medicare Other | Attending: Oncology | Admitting: Hospice and Palliative Medicine

## 2021-07-30 ENCOUNTER — Inpatient Hospital Stay: Payer: Medicare Other

## 2021-07-30 VITALS — BP 106/72 | HR 87 | Temp 98.4°F | Resp 16 | Wt 94.0 lb

## 2021-07-30 DIAGNOSIS — Z8546 Personal history of malignant neoplasm of prostate: Secondary | ICD-10-CM | POA: Diagnosis not present

## 2021-07-30 DIAGNOSIS — C3492 Malignant neoplasm of unspecified part of left bronchus or lung: Secondary | ICD-10-CM | POA: Diagnosis not present

## 2021-07-30 DIAGNOSIS — C329 Malignant neoplasm of larynx, unspecified: Secondary | ICD-10-CM | POA: Insufficient documentation

## 2021-07-30 DIAGNOSIS — R634 Abnormal weight loss: Secondary | ICD-10-CM | POA: Insufficient documentation

## 2021-07-30 DIAGNOSIS — Z7189 Other specified counseling: Secondary | ICD-10-CM | POA: Diagnosis not present

## 2021-07-30 DIAGNOSIS — I82C11 Acute embolism and thrombosis of right internal jugular vein: Secondary | ICD-10-CM | POA: Insufficient documentation

## 2021-07-30 DIAGNOSIS — Z7901 Long term (current) use of anticoagulants: Secondary | ICD-10-CM | POA: Insufficient documentation

## 2021-07-30 DIAGNOSIS — C7951 Secondary malignant neoplasm of bone: Secondary | ICD-10-CM | POA: Diagnosis not present

## 2021-07-30 DIAGNOSIS — F1090 Alcohol use, unspecified, uncomplicated: Secondary | ICD-10-CM | POA: Diagnosis not present

## 2021-07-30 DIAGNOSIS — C109 Malignant neoplasm of oropharynx, unspecified: Secondary | ICD-10-CM | POA: Diagnosis not present

## 2021-07-30 DIAGNOSIS — Z515 Encounter for palliative care: Secondary | ICD-10-CM

## 2021-07-30 DIAGNOSIS — Z87891 Personal history of nicotine dependence: Secondary | ICD-10-CM | POA: Diagnosis not present

## 2021-07-30 DIAGNOSIS — G893 Neoplasm related pain (acute) (chronic): Secondary | ICD-10-CM | POA: Insufficient documentation

## 2021-07-30 DIAGNOSIS — C3412 Malignant neoplasm of upper lobe, left bronchus or lung: Secondary | ICD-10-CM | POA: Insufficient documentation

## 2021-07-30 NOTE — Progress Notes (Signed)
[place] at [facility] [phone] [phone]   Name: Gregory Burton Date: 07/30/2021 MRN: [id number]  DOB: 1940/06/14  Patient Care Team: Casilda Carls, MD as PCP - General (Internal Medicine) Earlie Server, MD as Consulting Physician (Oncology)    REASON FOR CONSULTATION: Gregory Burton is a 81 y.o. 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  Palliative care was consulted help address goals and manage ongoing symptoms.  SOCIAL HISTORY:     reports that he quit smoking about 13 years ago. His smoking use included cigarettes. He has never used smokeless tobacco. He reports current alcohol use. He reports that he does not use drugs.  Patient is married and lives at home with his wife.  They have a son who is involved in patient's care.  Patient had a variety of jobs including [employer].  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   Prostate CA ([place]) 2008   Squamous cell lung cancer ([place]) 06/23/2019    PAST SURGICAL HISTORY:  Past Surgical History:  Procedure Laterality Date   BRAIN SURGERY  2012   HERNIA REPAIR     PORTA CATH INSERTION N/A 07/27/2019   Procedure: PORTA CATH INSERTION;  Surgeon: Katha Cabal, MD;  Location: [facility];  Service: Cardiovascular;  Laterality: N/A;    HEMATOLOGY/ONCOLOGY HISTORY:  Oncology History  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[place])  06/22/2019 Initial Diagnosis   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([place])    06/23/2019 Cancer Staging   Staging form: Pharynx - P16 Negative Oropharynx, AJCC 8th Edition - Clinical stage from 06/23/2019: Stage IVB (cT4b, cN2b, cM0, p16-) - Signed by Earlie Server, MD on 06/23/2019    07/19/2019 - 08/18/2019 Chemotherapy          05/22/2020 - 05/22/2020 Chemotherapy          Squamous cell lung cancer ([place])  06/23/2019 Initial Diagnosis   Squamous cell lung cancer ([place])    07/19/2019 Cancer Staging   Staging form: Lung, AJCC 8th Edition - Clinical: cT1, cN0, cM0 - Signed by Earlie Server, MD on 07/19/2019    Larynx cancer ([place])  07/14/2020 Initial Diagnosis   Larynx cancer ([place])    07/26/2020 - 09/06/2020 Chemotherapy    Patient is on Treatment Plan: HEAD/NECK PEMBROLIZUMAB Q21D       07/26/2020 Cancer Staging   Staging form: Larynx - Glottis, AJCC 8th Edition - Clinical: Stage IVC (cT1, cNX, cM1) - Signed by Earlie Server, MD on 07/26/2020    10/04/2020 -  Chemotherapy   Patient is on Treatment Plan : HEAD/NECK Pembrolizumab Q21D        ALLERGIES:  is allergic to ace inhibitors and enalapril.  MEDICATIONS:  Current Outpatient Medications  Medication Sig Dispense Refill   atorvastatin (LIPITOR) 20 MG tablet Take 20 mg by mouth daily. (Patient not taking: Reported on 04/04/2021)     colchicine 0.6 MG tablet Take 0.6 mg by mouth daily. SMARTSIG:1 Tablet(s) By  Mouth Daily (Patient not taking: Reported on 02/06/2021)     donepezil (ARICEPT) 10 MG tablet Take 10 mg by mouth at bedtime.     dorzolamide-timolol (COSOPT) 22.3-6.8 MG/ML ophthalmic solution Place 1 drop into both eyes 2 (two) times daily.      ELIQUIS 2.5 MG TABS tablet TAKE 1 TABLET BY MOUTH TWICE A DAY 60 tablet 1   folic acid (FOLVITE) 1 MG tablet Take 1 mg by mouth daily.     latanoprost (XALATAN) 0.005 % ophthalmic solution Place 1 drop into both eyes at bedtime.      lidocaine-prilocaine (EMLA) cream Apply 1 application topically as needed. Apply small amount to port site approx 1-2 hours  prior to appointment. 30 g 1   MAG64 64 MG TBEC TAKE 1 TABLET (64 MG TOTAL) BY MOUTH 2 (TWO) TIMES DAILY. 180 tablet 0   Multiple Vitamins-Minerals (CENTRUM ADULTS PO) Take by mouth.     nystatin (MYCOSTATIN) 100000 UNIT/ML suspension Take 5 mLs (500,000 Units total) by mouth 4 (four) times daily. 480 mL 3   oxyCODONE (OXY IR/ROXICODONE) 5 MG immediate release tablet Take 1 tablet (5 mg total) by mouth every 8 (eight) hours as needed for severe pain or moderate pain. 90 tablet 0   prochlorperazine (COMPAZINE) 10 MG tablet Take 1 tablet (10 mg total) by mouth every 8 (eight) hours as needed for nausea or vomiting. 60 tablet 0   Thiamine HCl (VITAMIN B-1 PO) Take 100 mg by mouth daily.     No current facility-administered medications for this visit.   Facility-Administered Medications Ordered in Other Visits  Medication Dose Route Frequency Provider Last Rate Last Admin   sodium chloride flush (NS) 0.9 % injection 10 mL  10 mL Intravenous PRN Earlie Server, MD   10 mL at 03/01/20 0812    VITAL SIGNS: BP 106/72   Pulse 87   Temp 98.4 F (36.9 C) (Tympanic)   Resp 16   Wt 94 lb (42.6 kg)   BMI 13.88 kg/m  Filed Weights   07/30/21 1030  Weight: 94 lb (42.6 kg)    Estimated body mass index is 13.88 kg/m as calculated from the following:   Height as of 07/19/21: 5' 9"  (1.753 m).   Weight as of this encounter: 94 lb (42.6 kg).  LABS: CBC:    Component Value Date/Time   WBC 6.0 07/10/2021 0824   HGB 10.9 (L) 07/10/2021 0824   HCT 34.3 (L) 07/10/2021 0824   PLT 196 07/10/2021 0824   MCV 96.6 07/10/2021 0824   NEUTROABS 4.5 07/10/2021 0824   LYMPHSABS 0.7 07/10/2021 0824   MONOABS 0.7 07/10/2021 0824   EOSABS 0.1 07/10/2021 0824   BASOSABS 0.0 07/10/2021 0824   Comprehensive Metabolic Panel:    Component Value Date/Time   NA 147 (H) 07/10/2021 0824   K 3.8 07/10/2021 0824   CL 109 07/10/2021 0824   CO2 27 07/10/2021 0824   BUN 27 (H) 07/10/2021 0824   CREATININE 0.83 07/10/2021  0824   GLUCOSE 122 (H) 07/10/2021 0824   CALCIUM 9.3 07/10/2021 0824   AST 22 07/10/2021 0824   ALT 17 07/10/2021 0824   ALKPHOS 101 07/10/2021 0824   BILITOT 0.6 07/10/2021 0824   PROT 7.6 07/10/2021 0824   ALBUMIN 3.4 (L) 07/10/2021 0824    RADIOGRAPHIC STUDIES: NM PET Image Restag (PS) Skull Base To Thigh  Result Date: 07/20/2021 CLINICAL DATA:  Subsequent treatment strategy for squamous cell carcinoma the left lung. Oropharyngeal cancer. EXAM:  NUCLEAR MEDICINE PET SKULL BASE TO THIGH TECHNIQUE: 5.7 mCi F-18 FDG was injected intravenously. Full-ring PET imaging was performed from the skull base to thigh after the radiotracer. CT data was obtained and used for attenuation correction and anatomic localization. Fasting blood glucose: 90 mg/dl COMPARISON:  Multiple exams, including CT scans of 06/19/2021 and prior PET-CT from 05/22/2020 FINDINGS: Mediastinal blood pool activity: SUV max 1.8 Liver activity: SUV max NA NECK: Centrally necrotic right retropharyngeal lymph node anterior to the arch of C1, maximum SUV 4.6 (formerly 3.3). A small right level IIb lymph node 0.4 cm in short axis on image 28 series 2, maximum SUV 5.2 (formerly 2.4). Tiny focus of activity in the vicinity of the left cricoid lamina/left inferior constrictor, maximum SUV 4.0, no CT correlate. Incidental CT findings: Dense bilateral carotid atherosclerotic calcification. CHEST: Anteromedial right upper lobe nodule 1.8 by 0.8 cm on image 91 of series 2 with maximum SUV 2.1, similar morphology to the 06/19/2021 exam, and not present on the prior PET-CT. The scarring and local airspace opacity spanning across the major fissure in the left upper lobe and left lower lobe laterally has a maximum SUV of about 1.4, previously 3.0. The clustered ground-glass opacity and tree-in-bud reticulonodular opacities in the right middle lobe are not substantially hypermetabolic. Similar opacities posteriorly in the right upper lobe are not  substantially hypermetabolic and are likely inflammatory. Incidental CT findings: Right Port-A-Cath tip: Right atrium. Coronary, aortic arch, and branch vessel atherosclerotic vascular disease. ABDOMEN/PELVIS: No significant abnormal hypermetabolic activity in this region. Incidental CT findings: Atherosclerosis is present, including aortoiliac atherosclerotic disease. Sigmoid colon diverticulosis. Brachytherapy seed implants in the prostate gland. Incidental finding of FDG extravasation along the left distal forearm. SKELETON: Hypermetabolic mass involving the right occipital bone and clivus with roughly similar appearance to 06/19/21, maximum SUV 8.9. This lesion is new from the prior PET-CT. New lytic lesion of the right C3 and C4 vertebra compared to prior PET-CT, maximum SUV 9.2. Other remote osseous lesions such as the right lytic lesion at T2 are not currently hypermetabolic. Incidental CT findings: Lumbar spondylosis and degenerative disc disease. IMPRESSION: 1. Currently active osseous metastatic disease in the right skull base, along with a new substantial lytic lesion of the right side of the C3 and C4 vertebra. Older bony metastatic lesions are not currently hypermetabolic. 2. Currently in the neck the centrally necrotic right retropharyngeal lymph node has maximum SUV of 4.6 (formerly 3.3) and a small right level IIb lymph node has a maximum SUV of 5.2. 3. Tiny focus of activity in the vicinity of the left inferior constrictor/left cricoid lamina, no CT correlate. 4. The recently seen right upper lobe medial nodule has a maximum SUV of 2.1, nonspecific for malignant versus inflammatory lesion. No other activity concerning for active malignancy in the chest, abdomen, or pelvis. 5. Other imaging findings of potential clinical significance: Aortic Atherosclerosis (ICD10-I70.0). Coronary atherosclerosis. Lumbar spondylosis and degenerative disc disease. Electronically Signed   By: Van Clines M.D.    On: 07/20/2021 14:39    PERFORMANCE STATUS (ECOG) : 2 - Symptomatic, <50% confined to bed  Review of Systems Unless otherwise noted, a complete review of systems is negative.  Physical Exam General: NAD Pulmonary: Unlabored Extremities: no edema, no joint deformities Skin: no rashes Neurological: Weakness but otherwise nonfocal  IMPRESSION: Today I met with patient and wife.  Patient's son participated in the visit via phone.  Patient has struggled with oral intake and weakness.  Today, he says he  feels weak but denies other significant symptomatic complaints.  He feels like his oral intake has improved recently with the addition of nutritional supplements, which he is drinking several times a day.  PET scan on 07/18/2021 revealed active osseous metastatic disease in the skull base along with a new substantial lytic lesion of the right side of C3 and C4 vertebrae.  At last medical oncology visit, options for additional chemotherapy were discussed but concern was that patient would not tolerate aggressive regimen given his poor performance status.  Patient has another visit with [name] on Monday.  Family states that they are awaiting that visit prior to making decisions on how to proceed.  Wife says that she has ACP documents at home and has plan to complete those.  I sent him home with a MOST form today to review.  PLAN: -Continue current scope of treatment -Wife to complete ACP documents and bring a copy to the clinic -MOST Form reviewed -Follow-up next week   Patient expressed understanding and was in agreement with this plan. He also understands that He can call the clinic at any time with any questions, concerns, or complaints.     Time Total: 15 minutes  Visit consisted of counseling and education dealing with the complex and emotionally intense issues of symptom management and palliative care in the setting of serious and potentially life-threatening illness.Greater than  50%  of this time was spent counseling and coordinating care related to the above assessment and plan.  Signed by: Altha Harm, PhD, NP-C

## 2021-07-30 NOTE — Telephone Encounter (Signed)
Please schedule patient MD only to go over results and plan. Please notify patient of appt. Thanks

## 2021-07-30 NOTE — Telephone Encounter (Signed)
-----   Message from Earlie Server, MD sent at 07/28/2021  2:44 PM EDT ----- Please schedule him for MD visit to go over images and plans.

## 2021-08-03 ENCOUNTER — Ambulatory Visit
Admission: RE | Admit: 2021-08-03 | Discharge: 2021-08-03 | Disposition: A | Discharge status: Home / Self Care | Payer: Medicare Other | Source: Ambulatory Visit | Attending: Unknown Physician Specialty | Admitting: Unknown Physician Specialty

## 2021-08-03 ENCOUNTER — Inpatient Hospital Stay: Admission: RE | Admit: 2021-08-03 | Payer: Medicare Other | Source: Ambulatory Visit

## 2021-08-03 DIAGNOSIS — G510 Bell's palsy: Secondary | ICD-10-CM

## 2021-08-03 IMAGING — MR MR BRAIN/TEMPORAL BONE/IAC
11 of 12 series · 45 of 48 positions shown · IV contrast (multihance)
Comparison: CT neck [DATE].

Head CT [DATE].

CLINICAL DATA: Facial nerve paralysis [SE] ([SE]-CM).

EXAM:
MRI HEAD WITHOUT AND WITH CONTRAST
TECHNIQUE: Multiplanar, multiecho pulse sequences of the brain and surrounding
structures were obtained without and with intravenous contrast.
CONTRAST:  9mL MULTIHANCE GADOBENATE DIMEGLUMINE 529 MG/ML IV SOLN

[Series 5: T1 · sagittal · 4.0mm · 0.72mm/px · 1 of 30 slices shown (1 of 3)]
[im 1/30]
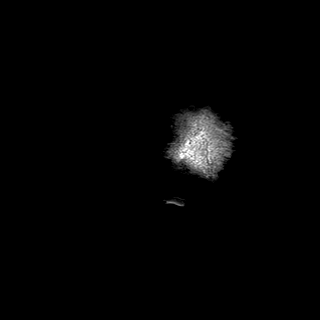

[Series 6: DWI · axial · 3.0mm · 0.94mm/px · z∈[-63,+80]mm · 10 of 164 slices shown]
[im 1/164]
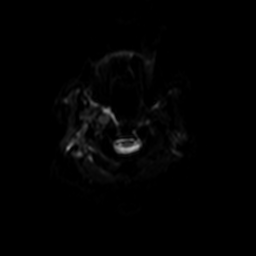
[im 19/164]
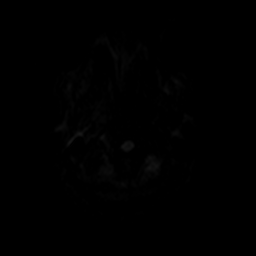
[im 37/164]
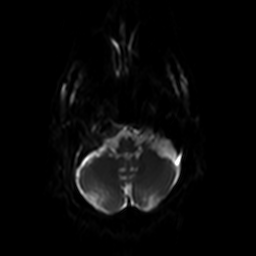
[im 55/164]
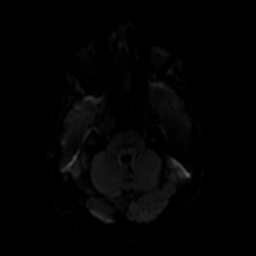
[im 73/164]
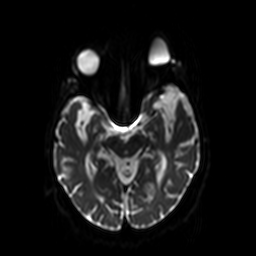
[im 91/164]
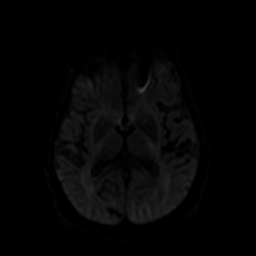
[im 109/164]
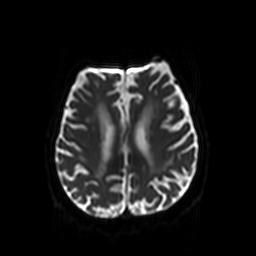
[im 127/164]
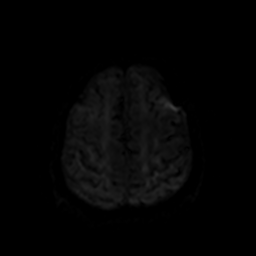
[im 145/164]
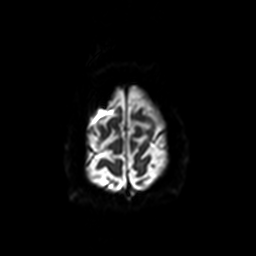
[im 164/164]
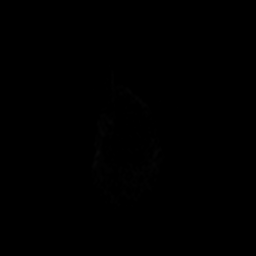

[Series 7: ax dwi_tracew · axial · 3.0mm · 0.94mm/px · z∈[-63,+80]mm · 6 of 82 slices shown]
[im 1/82]
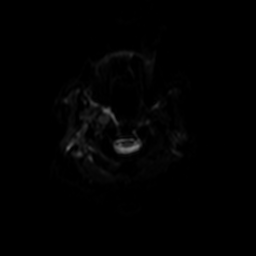
[im 17/82]
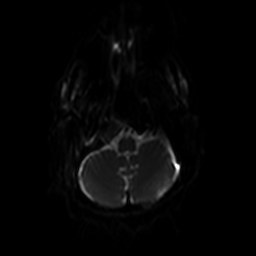
[im 33/82]
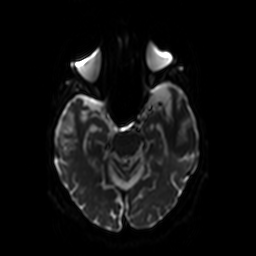
[im 49/82]
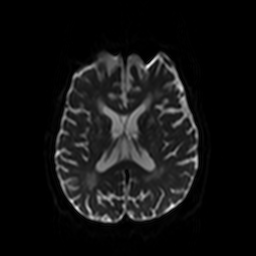
[im 65/82]
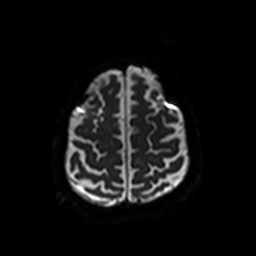
[im 82/82]
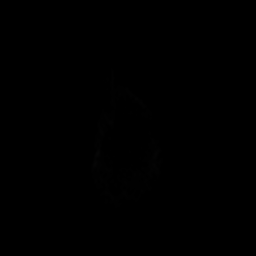

[Series 8: ax dwi_adc · axial · 3.0mm · 0.94mm/px · z∈[-63,+80]mm · 3 of 39 slices shown]
[im 1/39]
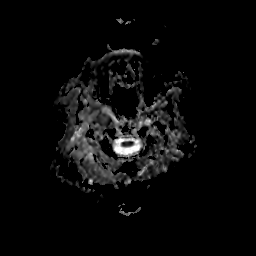
[im 20/39]
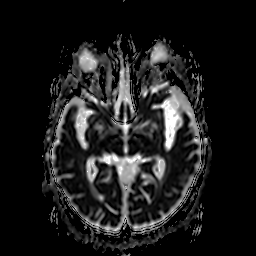
[im 39/39]
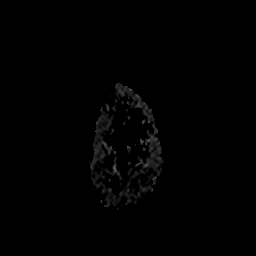

[Series 9: T2 · axial · 4.0mm · 0.36mm/px · z∈[-67,+77]mm · 2 of 29 slices shown]
[im 1/29]
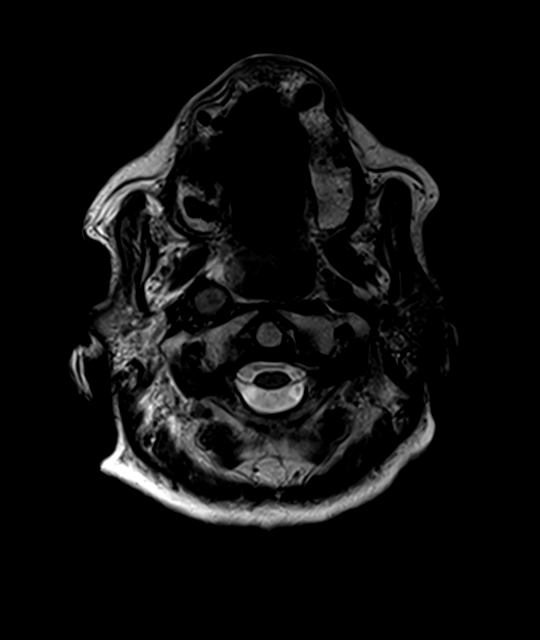
[im 29/29]
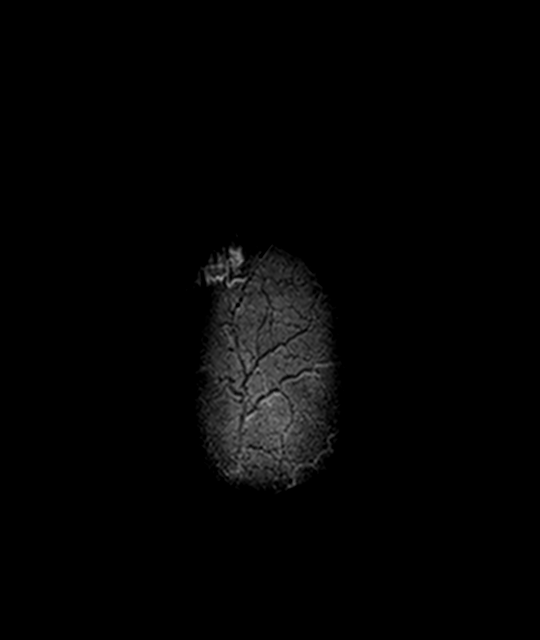

[Series 10: FLAIR · axial · 3.0mm · 0.72mm/px · z∈[-74,+87]mm · 2 of 28 slices shown]
[im 1/28]
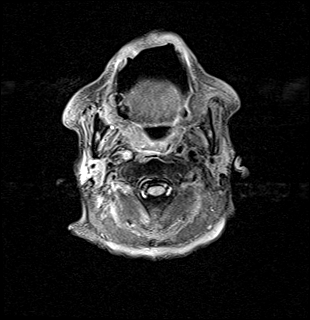
[im 28/28]
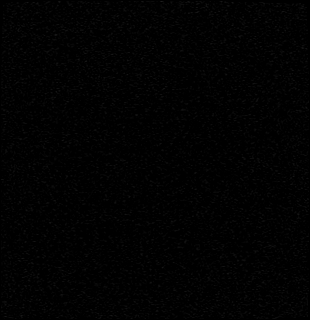

[Series 12: swi_images · axial · 3.0mm · 0.90mm/px · z∈[-71,+80]mm · 4 of 52 slices shown]
[im 1/52]
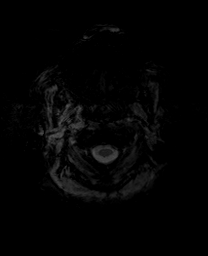
[im 18/52]
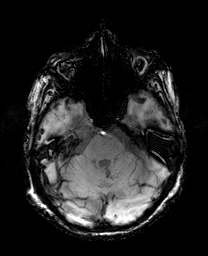
[im 35/52]
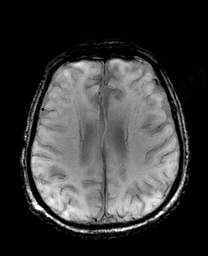
[im 52/52]
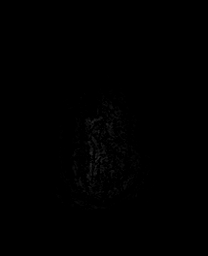

[Series 13: T1 · coronal · 3.0mm · 0.56mm/px · 1 of 13 slices shown (2 of 3)]
[im 1/13]
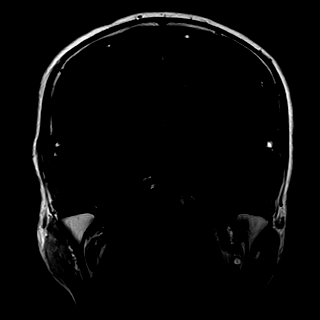

[Series 16: T1 post-contrast · coronal · 3.0mm · 0.56mm/px · 1 of 13 slices shown (1 of 2)]
[im 1/13]
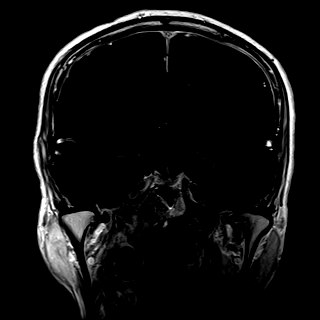

[Series 18: T1 post-contrast · axial · 1.0mm · 0.90mm/px · z∈[-113,+75]mm · 13 of 192 slices shown (2 of 2)]
[im 1/192]
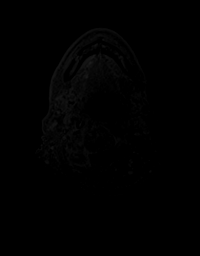
[im 16/192]
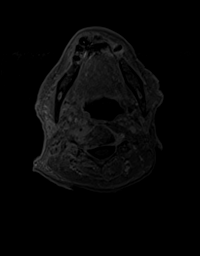
[im 32/192]
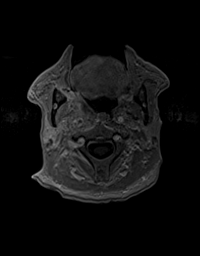
[im 48/192]
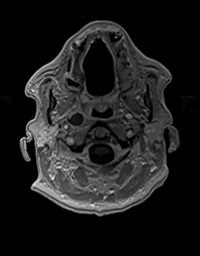
[im 64/192]
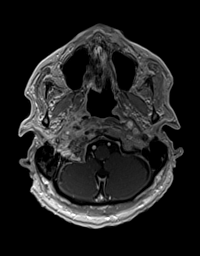
[im 80/192]
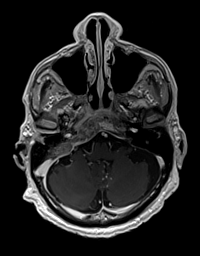
[im 96/192]
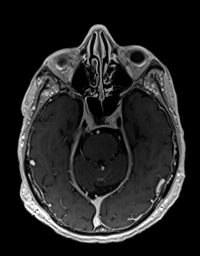
[im 112/192]
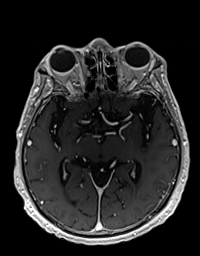
[im 128/192]
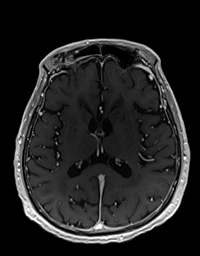
[im 144/192]
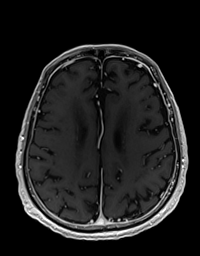
[im 160/192]
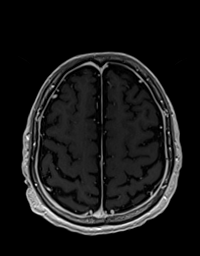
[im 176/192]
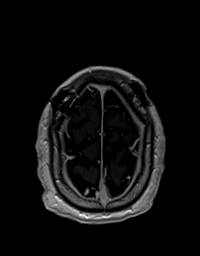
[im 192/192]
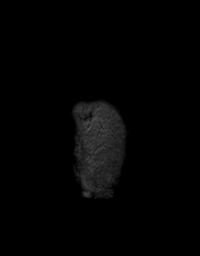

[Series 19: T1 · axial · 3.0mm · 0.50mm/px · z∈[-85,-4]mm · 2 of 28 slices shown (3 of 3)]
[im 1/28]
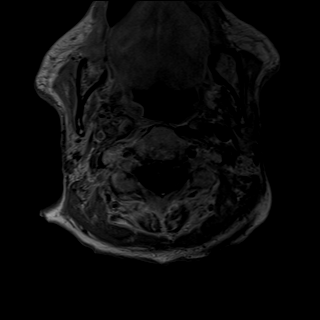
[im 28/28]
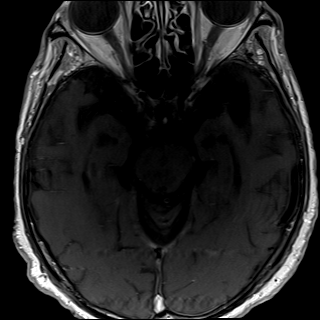

[45 of 48 positions shown; findings below may reference images not displayed]

FINDINGS: Brain: No acute infarction, hemorrhage, hydrocephalus or extra-axial
collection.

Scattered and confluent foci of T2 hyperintensity are seen within
the white matter of the cerebral hemispheres, nonspecific, most
likely related to chronic small vessel ischemia. Moderate
parenchymal volume loss. Incidentally noted developmental venous
anomaly in the cerebellum.

A dural-based extra-axial lesion measuring approximately 0.6 x
cm is seen at the posterior margin of the left internal auditory
canal without extension into the canal, most likely represent a
small meningioma.

Vascular: Normal flow voids.

Skull and upper cervical spine: Enhancing mass lesion involving the
right occipital condyle, clivus, right petrous apex, surrounding the
right otic capsule and extending into the anterior mastoid. T2
signal within the cochlea, vestibule and semi circular canals appear
preserved. The right internal auditory canal appear narrowed but
patent. The lesion involves the petrous segment of the right
internal carotid artery which appear mildly narrow but remains
patent. There is significant narrowing of the right Meckel's cave.
Tumor extension into the hypoglossal canal, jugular foramen and
along the mice toilet segment of the facial canal is seen. Preserved
patency of the right jugular bulb. Although 10 dural enhancement is
seen along the right petrous apex and internal auditory canal, no
intradural tumor is identified.

Partially visualized mass lesion involving the vertebral body and
right-sided posterior elements at C4 with associated retropulsion
resulting in mild spinal canal stenosis.

Postsurgical changes from bilateral frontal burr holes.

Sinuses/Orbits: Trace mucosal thickening throughout the paranasal
sinuses. Right-sided lamina papyracea dehiscence.

Other: Right retropharyngeal necrotic lymph node measures 1.6 cm
compared to 1.4 cm on prior CT of the neck. Ill-defined mass lesion
with irregular contrast enhancement in the right parapharyngeal and
carotid spaces with narrowing of the right internal carotid artery
and right internal jugular vein which appear patent. This is only
partially characterized.
IMPRESSION: 1. Right-sided skull base mass lesion involving right occipital
condyle, clivus and right temporal bone, as detailed above, without
intradural extension. Right facial nerve paralysis may be explained
by extension of tumor into the facial canal.
2. Right parapharyngeal and carotid space enhancing lesion resulting
in narrowing of the cervical right ICA and right internal jugular
vein, only partially evaluated. Retropharyngeal lymph node increased
from 1.4 cm 1.6 cm compared to prior CT.
3. Moderate chronic microvascular ischemic changes of the white
matter and parenchymal volume loss.

## 2021-08-03 MED ORDER — SODIUM CHLORIDE 0.9% FLUSH
10.0000 mL | Freq: Once | INTRAVENOUS | Status: AC
  Administered 2021-08-03: 10 mL via INTRAVENOUS

## 2021-08-03 MED ORDER — HEPARIN SOD (PORK) LOCK FLUSH 100 UNIT/ML IV SOLN: 500.0000 [IU] | Freq: Once | INTRAVENOUS | Status: DC

## 2021-08-03 MED ORDER — GADOBENATE DIMEGLUMINE 529 MG/ML IV SOLN
9.0000 mL | Freq: Once | INTRAVENOUS | Status: AC | PRN
  Administered 2021-08-03: 9 mL via INTRAVENOUS

## 2021-08-03 NOTE — Progress Notes (Signed)
Pt. Port accessed for MRI 3cc removed upon insertion and flushed. De-accessed after MRI heparin instilled. [place] RN

## 2021-08-06 ENCOUNTER — Inpatient Hospital Stay (HOSPITAL_BASED_OUTPATIENT_CLINIC_OR_DEPARTMENT_OTHER): Payer: Medicare Other | Admitting: Oncology

## 2021-08-06 ENCOUNTER — Inpatient Hospital Stay (HOSPITAL_BASED_OUTPATIENT_CLINIC_OR_DEPARTMENT_OTHER): Payer: Medicare Other | Admitting: Hospice and Palliative Medicine

## 2021-08-06 ENCOUNTER — Encounter: Payer: Self-pay | Admitting: Oncology

## 2021-08-06 ENCOUNTER — Telehealth: Payer: Self-pay | Admitting: Nurse Practitioner

## 2021-08-06 ENCOUNTER — Inpatient Hospital Stay: Payer: Medicare Other

## 2021-08-06 VITALS — BP 111/76 | HR 76 | Temp 96.7°F | Resp 18 | Wt 90.5 lb

## 2021-08-06 DIAGNOSIS — Z7189 Other specified counseling: Secondary | ICD-10-CM

## 2021-08-06 DIAGNOSIS — G893 Neoplasm related pain (acute) (chronic): Secondary | ICD-10-CM

## 2021-08-06 DIAGNOSIS — C3492 Malignant neoplasm of unspecified part of left bronchus or lung: Secondary | ICD-10-CM

## 2021-08-06 DIAGNOSIS — R634 Abnormal weight loss: Secondary | ICD-10-CM

## 2021-08-06 DIAGNOSIS — C109 Malignant neoplasm of oropharynx, unspecified: Secondary | ICD-10-CM

## 2021-08-06 DIAGNOSIS — Z515 Encounter for palliative care: Secondary | ICD-10-CM | POA: Diagnosis not present

## 2021-08-06 DIAGNOSIS — C329 Malignant neoplasm of larynx, unspecified: Secondary | ICD-10-CM

## 2021-08-06 NOTE — Progress Notes (Signed)
Hematology/Oncology Progress note [phone] [phone]      Patient Care Team: Casilda Carls, MD as PCP - General (Internal Medicine) Earlie Server, [name], [name]  HISTORY OF PRESENTING ILLNESS:  History of prostate cancer. 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  PD-L1-IHC 40%, no targetable mutations. # 09/07/2019 chemotherapy [cisplatin] and radiation.   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  Interval decrease of the posterior left upper lobe pleural-based nodule with interval decrease in size of the tiny adjacent satellite nodule.  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  No recurrent tumor in right piriform sinus.  Right lateral pharyngeal lymph node is stable.  No new or recurrent adenopathy.Suspect metastasis.  I recommend patient to obtain PET scan restaging.  I discussed about the plan of re-biopsy of either the paraspinal soft tissue mass versus other more feasible hypermetabolic sites detected on PET scan. Patient was seen by Dr. Kennyth Lose and opted to proceed with palliative radiation as soon as possible.  PET scan cannot be arranged prior to the radiation so the PET scan was canceled. 04/20/2020 -04/25/2020, palliative radiation to thoracic spine  05/10/2020, MRI thoracic spine was obtained for  worsening of back pain and left shoulder pain.  Images showed metastatic disease throughout almost the entire T2 with and associated mild superior endplate compression fracture.  Abnormal signal in the anterior, inferior endplate of T1, superior aspect of the T3.  Small metastatic deposit in T12 is also noted. Case was also discussed on multidisciplinary tumor board on 05/25/2020 05/22/2020, PET scan showed new hypermetabolic lytic bone metastasis in the left scapular near the glenoid and in the T2 vertebral body.  Persistent hypermetabolic right lateral retropharyngeal and right level 3 neck lymph node metastasis.  Peripheral left upper lobe pulmonary nodule is mildly decreased with new surrounding mild platelike postradiation changes..  No residual recurrent neoplasm at the site of vomiting in the right piriform sinus  06/07/2020 -06/09/2020 patient underwent palliative radiation to left scapular April 2022 seen by [name] and flexible laryngoscope examination showed new lesion at the right false vocal cord.  Case was discussed on tumor board on 06/22/2020. [name] recommend additional CT soft tissue neck with contrast for additional evaluation. 06/29/2020 CT images were reviewed and discussed with patient. There is new malignant lymph node in the right mid neck at the level of the hyoid, this compressing the right jugular vein with thrombus in the vein above and below the lymph node.  Cystic retropharyngeal lymph node on the right is stable.  Progressive soft tissue thickening in the larynx bilaterally suspicious for tumor.  Metastatic disease at the T2 is unchanged.  Metastatic deposit left scapula there is now a pathological fracture of the scapular. CT scan findings were discussed with ENT [name] via secure chat.  [name] feels surgery is not feasible.  The larynx lesion currently does not  compromise his airway however further progression may in the future  07/10/2020 Seen by Radonc  [name] who would offer salvage radiation to his larynx as well as neck adenopathy. Would like low dose chemotherapy as sensitizer.    Patient was also referred to establish care with Dr. Georgiann Cocker at [facility] for kyphoplasty.  Patient's wife reports that they were never contacted and she called Dr. Georgiann Cocker office with no success.  # NGS.  PD-L1-IHC 40%, no targetable mutations # 07/25/20- 07/27/2020, 08/01/20- 09/06/2020  concurrent chemoradiation [low-dose weekly cisplatin 24m/m2 ] to the larynx  09/15/2020, patient was seen by ENT Dr. MTami Ribas  Laryngoscope examination reviewed clear larynx, no lesion was discovered. 10/04/2020, started on maintenance Keytruda. 12/11/2020 restaging CT 1. Progression of Disease: - new since May right C3 destructive osseous metastasis has eroded the right pedicle and facet, with tumor filling the right C4 neural foramen and engulfing the right vertebral artery which remains patent. Mild right lateral epidural tumor. - small (8 mm short axis) but progressed right level 2A nodal disease with evidence of extracapsular extension. 2. 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  3. No recurrent laryngeal or pharyngeal tumor identified. 4. 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 CT Chest, Abdomen, and Pelvis the same day are reported separately.  01/16/2021, patient underwent additional palliative radiation to C-spine. 02/05/2021 Patient had a fall and went to emergency room on .   CT cervical spine without contrast showed stable destructive process in the right side of the T3 vertebral body.  Progressive destructive process in the right side and the posterior element of C4.  No fracture.  03/06/2021, MRI cervical spine with and without contrast showed destructive metastatic lesion at C4 involving the right posterior  elements.  There is mild epidural disease without canal stenosis.  Involvement of right C3-C4 and C4-C5 foraminal.  Abnormal signal at the inferior C3 endplate is probably on degenerative basis.  Destructive metastatic lesion at T2 with pathological fracture. 03/09/2021 patient was seen by neurosurgeon Dr. YCari Caraway  Based on his current disease burden, dementia, lack of obvious unstable lesion, surgical fixation was not recommended.  Imaging surveillance was recommended and Dr. YCari Carawayis happy to review his imaging over time.  If he has worsening tumor burden, could consider vertebroplasty at T2 or at C4. 03/20/2021, CT chest abdomen pelvis with contrast showed Left upper lobe subpleural nodule[previously treated with radiation] showed interval decrease, with evolving radiation pneumonitis/fibrosis. Multiple bilateral cystic pulmonary lesions, increased in size, at least 1 of which with significant wall thickening.  Interval enlargement of a solid nodule of the right upper lobe, consistent with an enlarging metastasis.  New irregular groundglass and consolidation in the dependent right upper lobe, lateral segment right middle lobe, suggesting nonspecific infection or aspiration.  An additional manifestation of malignancy is difficult to exclude.  Emphysema.  CAD.  1/30//2023 - 06/19/2021 5 cycles of dose reduced carboplatin/Keytruda 06/19/2021, CT soft tissue neck with contrast showed mild progression of destructive lesions at the right skull base and right aspect of C4 greater than C3. 06/19/2021 CT chest abdomen pelvis with contrast showed solid nodule immediately in the right upper lobe shows enlargement.  Previously described ill-defined right upper lobe nodularity and surrounding groundglass opacities have improved.  Little changes in the appearance of the treated lesion in the left upper lobe.     INTERVAL HISTORY  Gregory Burton  a 81 y.o. male who has above history reviewed by me today  presents for follow-up of head and neck cancer and lung cancer, goals of care discussion.  07/18/2021, PET scan showed active osseous metastatic disease in the right skull base, along with a new substantial lytic lesion of the right side of C3 and C4.  old bony metastatic lesions or not currently hypermetabolic.  Centrally necrotic right retropharyngeal lymph node has maximal SUV of 4.6, small right level 2 lymph nodes and an SUV of 5.2.  Tiny focus of activity in the density of the left inferior constrictor/left cricoid lamina.  Right upper lobe medial nodule has a maximum SUV of 2.1.  Nonspecific.  Chemotherapy has been held since end of April 2023 due to declining performance status. Discussed with Dr. Cari Caraway.  No surgical intervention needed. During interval, patient has been seen by radiation oncologist.  Dr. Baruch Gouty feels that patient has had received maximum dose of his cervical spine, Dr. Cheron Schaumann suggest palliative care at this time.  Also discussed with patient's case with [name].  MRI brain was obtained on 08/03/2021, results are pending.  Today patient was accompanied by wife.  He reports some neck pain.  Appetite is good.  He has lost 2 pounds since last visit.    Review of Systems  Constitutional:  Positive for fatigue. Negative for appetite change, chills, fever and unexpected weight change.  HENT:   Positive for hearing loss.   Eyes:  Negative for eye problems and icterus.  Respiratory:  Negative for chest tightness, cough and shortness of breath.   Cardiovascular:  Negative for chest pain and leg swelling.  Gastrointestinal:  Negative for abdominal distention, abdominal pain, blood in stool and nausea.  Endocrine: Negative for hot flashes.  Genitourinary:  Negative for difficulty urinating, dysuria and frequency.   Musculoskeletal:  Positive for arthralgias.  Skin:  Negative for itching and rash.  Neurological:  Negative for extremity weakness, light-headedness and  numbness.  Hematological:  Negative for adenopathy. Does not bruise/bleed easily.  Psychiatric/Behavioral:  Negative for confusion.        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   Prostate CA ([place]) 2008   Squamous cell lung cancer ([place]) 06/23/2019    SURGICAL HISTORY: Past Surgical History:  Procedure Laterality Date   BRAIN SURGERY  2012   HERNIA REPAIR     PORTA CATH INSERTION N/A 07/27/2019   Procedure: PORTA CATH INSERTION;  Surgeon: Katha Cabal, MD;  Location: [facility];  Service: Cardiovascular;  Laterality: N/A;    SOCIAL HISTORY: Social History   Socioeconomic History   Marital status: Married    Spouse name: Not on file   Number of children: Not on file   Years of education: Not on file   Highest education level: Not on file  Occupational History   Not on file  Tobacco Use   Smoking status: Former    Years: 21.00    Types: Cigarettes    Quit date: 05/11/2008    Years since quitting: 13.2   Smokeless tobacco: Never  Vaping Use   Vaping Use:  Never used  Substance and Sexual Activity   Alcohol use: Yes    Comment: 0.5 pint liquor a week   Drug use: Never   Sexual activity: Not Currently  Other Topics Concern   Not on file  Social History Narrative   Lives at home with wife. Wife states he has some dementia but is able to sign his own consent.   Social Determinants of Health   Financial Resource Strain: Not on file  Food Insecurity: Not on file  Transportation Needs: No Transportation Needs (07/10/2021)   PRAPARE - Hydrologist (Medical): No    Lack of Transportation (Non-Medical): No  Physical Activity: Not  on file  Stress: Not on file  Social Connections: Not on file  Intimate Partner Violence: Not on file    FAMILY HISTORY: History reviewed. No pertinent family history.  ALLERGIES:  is allergic to ace inhibitors and enalapril.  MEDICATIONS:  Current Outpatient Medications  Medication Sig Dispense Refill   atorvastatin (LIPITOR) 20 MG tablet Take 20 mg by mouth daily.     donepezil (ARICEPT) 10 MG tablet Take 10 mg by mouth at bedtime.     dorzolamide-timolol (COSOPT) 22.3-6.8 MG/ML ophthalmic solution Place 1 drop into both eyes 2 (two) times daily.      ELIQUIS 2.5 MG TABS tablet TAKE 1 TABLET BY MOUTH TWICE A DAY 60 tablet 1   folic acid (FOLVITE) 1 MG tablet Take 1 mg by mouth daily.     latanoprost (XALATAN) 0.005 % ophthalmic solution Place 1 drop into both eyes at bedtime.      lidocaine-prilocaine (EMLA) cream Apply 1 application topically as needed. Apply small amount to port site approx 1-2 hours prior to appointment. 30 g 1   MAG64 64 MG TBEC TAKE 1 TABLET (64 MG TOTAL) BY MOUTH 2 (TWO) TIMES DAILY. 180 tablet 0   megestrol (MEGACE) 40 MG tablet Take 1 tablet by mouth 2 (two) times daily.     Multiple Vitamins-Minerals (CENTRUM ADULTS PO) Take by mouth.     nystatin (MYCOSTATIN) 100000 UNIT/ML suspension Take 5 mLs (500,000 Units total) by mouth 4 (four) times daily. 480 mL 3   oxyCODONE (OXY IR/ROXICODONE) 5 MG immediate release tablet Take 1 tablet (5 mg total) by mouth every 8 (eight) hours as needed for severe pain or moderate pain. 90 tablet 0   prochlorperazine (COMPAZINE) 10 MG tablet Take 1 tablet (10 mg total) by mouth every 8 (eight) hours as needed for nausea or vomiting. 60 tablet 0   Thiamine HCl (VITAMIN B-1 PO) Take 100 mg by mouth daily.     colchicine 0.6 MG tablet Take 0.6 mg by mouth daily. SMARTSIG:1 Tablet(s) By Mouth Daily (Patient not taking: Reported on 02/06/2021)     No current facility-administered medications for this visit.   Facility-Administered  Medications Ordered in Other Visits  Medication Dose Route Frequency Provider Last Rate Last Admin   sodium chloride flush (NS) 0.9 % injection 10 mL  10 mL Intravenous PRN Earlie Server, MD   10 mL at 03/01/20 9675     PHYSICAL EXAMINATION: ECOG PERFORMANCE STATUS: 1 - Symptomatic but completely ambulatory Vitals:   08/06/21 0932  BP: 111/76  Pulse: 76  Resp: 18  Temp: (!) 96.7 F (35.9 C)   Filed Weights   08/06/21 0932  Weight: 90 lb 8 oz (41.1 kg)    Physical Exam Constitutional:      General: He is not in acute distress.  HENT:     Head: Normocephalic and atraumatic.  Eyes:     General: No scleral icterus. Cardiovascular:     Rate and Rhythm: Normal rate and regular rhythm.     Heart sounds: Normal heart sounds.  Pulmonary:     Effort: Pulmonary effort is normal. No respiratory distress.     Breath sounds: No wheezing.  Abdominal:     General: Bowel sounds are normal. There is no distension.     Palpations: Abdomen is soft.  Musculoskeletal:        General: No deformity.     Cervical back: Normal range of motion and neck supple.  Lymphadenopathy:     Cervical: No cervical adenopathy.  Skin:    General: Skin is warm and dry.     Findings: No erythema or rash.  Neurological:     Mental Status: He is alert. Mental status is at baseline.     Cranial Nerves: No cranial nerve deficit.     Coordination: Coordination normal.     LABORATORY DATA:  I have reviewed the data as listed Lab Results  Component Value Date   WBC 6.0 07/10/2021   HGB 10.9 (L) 07/10/2021   HCT 34.3 (L) 07/10/2021   MCV 96.6 07/10/2021   PLT 196 07/10/2021   Recent Labs    01/11/21 0918 01/11/21 1351 05/29/21 0814 06/19/21 0809 07/10/21 0824  NA SPECIMEN CONTAMINATED, UNABLE TO PERFORM TEST(S).   < > 140 140 147*  K SPECIMEN CONTAMINATED, UNABLE TO PERFORM TEST(S).   < > 3.9 3.5 3.8  CL SPECIMEN CONTAMINATED, UNABLE TO PERFORM TEST(S).   < > 106 108 109  CO2 SPECIMEN CONTAMINATED,  UNABLE TO PERFORM TEST(S).   < > _0 GLUCOSE SPECIMEN CONTAMINATED, UNABLE TO PERFORM TEST(S).   < > 113* 142* 122*  BUN SPECIMEN CONTAMINATED, UNABLE TO PERFORM TEST(S).   < > 19 17 27*  CREATININE SPECIMEN CONTAMINATED, UNABLE TO PERFORM TEST(S).   < > 0.65 0.73 0.83  CALCIUM SPECIMEN CONTAMINATED, UNABLE TO PERFORM TEST(S).   < > 9.3 9.1 9.3  GFRNONAA SPECIMEN CONTAMINATED, UNABLE TO PERFORM TEST(S).   < > >60 >60 >60  PROT 3.8*   < > 6.9 6.8 7.6  ALBUMIN 2.0*   < > 3.4* 3.4* 3.4*  AST 9*   < > _1 ALT 8   < > _2 ALKPHOS 30*   < > 73 72 101  BILITOT 0.5   < > 0.2* 0.6 0.6  BILIDIR <0.1  --   --   --   --   IBILI NOT CALCULATED  --   --   --   --    < > = values in this interval not displayed.    Iron/TIBC/Ferritin/ %Sat No results found for: "IRON", "TIBC", "FERRITIN", "IRONPCTSAT"    RADIOGRAPHIC STUDIES: I have personally reviewed the radiological images as listed and agreed with the findings in the report. NM PET Image Restag (PS) Skull Base To Thigh  Result Date: 07/20/2021 CLINICAL DATA:  Subsequent treatment strategy for squamous cell carcinoma the left lung. Oropharyngeal cancer. EXAM: NUCLEAR MEDICINE PET SKULL BASE TO THIGH TECHNIQUE: 5.7 mCi F-18 FDG was injected intravenously. Full-ring PET imaging was performed from the skull base to thigh after the radiotracer. CT data was obtained and used for attenuation correction and anatomic localization. Fasting blood glucose: 90 mg/dl COMPARISON:  Multiple exams, including CT scans of 06/19/2021 and prior PET-CT from 05/22/2020  FINDINGS: Mediastinal blood pool activity: SUV max 1.8 Liver activity: SUV max NA NECK: Centrally necrotic right retropharyngeal lymph node anterior to the arch of C1, maximum SUV 4.6 (formerly 3.3). A small right level IIb lymph node 0.4 cm in short axis on image 28 series 2, maximum SUV 5.2 (formerly 2.4). Tiny focus of activity in the vicinity of the left cricoid lamina/left inferior  constrictor, maximum SUV 4.0, no CT correlate. Incidental CT findings: Dense bilateral carotid atherosclerotic calcification. CHEST: Anteromedial right upper lobe nodule 1.8 by 0.8 cm on image 91 of series 2 with maximum SUV 2.1, similar morphology to the 06/19/2021 exam, and not present on the prior PET-CT. The scarring and local airspace opacity spanning across the major fissure in the left upper lobe and left lower lobe laterally has a maximum SUV of about 1.4, previously 3.0. The clustered ground-glass opacity and tree-in-bud reticulonodular opacities in the right middle lobe are not substantially hypermetabolic. Similar opacities posteriorly in the right upper lobe are not substantially hypermetabolic and are likely inflammatory. Incidental CT findings: Right Port-A-Cath tip: Right atrium. Coronary, aortic arch, and branch vessel atherosclerotic vascular disease. ABDOMEN/PELVIS: No significant abnormal hypermetabolic activity in this region. Incidental CT findings: Atherosclerosis is present, including aortoiliac atherosclerotic disease. Sigmoid colon diverticulosis. Brachytherapy seed implants in the prostate gland. Incidental finding of FDG extravasation along the left distal forearm. SKELETON: Hypermetabolic mass involving the right occipital bone and clivus with roughly similar appearance to 06/19/21, maximum SUV 8.9. This lesion is new from the prior PET-CT. New lytic lesion of the right C3 and C4 vertebra compared to prior PET-CT, maximum SUV 9.2. Other remote osseous lesions such as the right lytic lesion at T2 are not currently hypermetabolic. Incidental CT findings: Lumbar spondylosis and degenerative disc disease. IMPRESSION: 1. Currently active osseous metastatic disease in the right skull base, along with a new substantial lytic lesion of the right side of the C3 and C4 vertebra. Older bony metastatic lesions are not currently hypermetabolic. 2. Currently in the neck the centrally necrotic right  retropharyngeal lymph node has maximum SUV of 4.6 (formerly 3.3) and a small right level IIb lymph node has a maximum SUV of 5.2. 3. Tiny focus of activity in the vicinity of the left inferior constrictor/left cricoid lamina, no CT correlate. 4. The recently seen right upper lobe medial nodule has a maximum SUV of 2.1, nonspecific for malignant versus inflammatory lesion. No other activity concerning for active malignancy in the chest, abdomen, or pelvis. 5. Other imaging findings of potential clinical significance: Aortic Atherosclerosis (ICD10-I70.0). Coronary atherosclerosis. Lumbar spondylosis and degenerative disc disease. Electronically Signed   By: Van Clines M.D.   On: 07/20/2021 14:39   CT CHEST ABDOMEN PELVIS W CONTRAST  Result Date: 06/20/2021 CLINICAL DATA:  Restaging squamous cell left upper lobe lung cancer. History of head and neck and prostate cancer. * Tracking Code: BO * EXAM: CT CHEST, ABDOMEN, AND PELVIS WITH CONTRAST TECHNIQUE: Multidetector CT imaging of the chest, abdomen and pelvis was performed following the standard protocol during bolus administration of intravenous contrast. RADIATION DOSE REDUCTION: This exam was performed according to the departmental dose-optimization program which includes automated exposure control, adjustment of the mA and/or kV according to patient size and/or use of iterative reconstruction technique. CONTRAST:  9m OMNIPAQUE IOHEXOL 300 MG/ML  SOLN COMPARISON:  Prior CTs 03/20/2021 and 12/11/2020.  PET-CT 05/22/2020 FINDINGS: Neck findings are dictated separately. Cardiovascular: Again demonstrated is extensive atherosclerosis of the aorta, great vessels and coronary arteries. No acute  vascular findings are seen. Right IJ Port-A-Cath extends to the upper right atrium. The heart size is normal. There is no pericardial effusion. Mediastinum/Nodes: There are no enlarged mediastinal, hilar or axillary lymph nodes. The thyroid gland, trachea and esophagus  demonstrate no significant findings. Lungs/Pleura: No pleural effusion or pneumothorax. Moderate centrilobular and paraseptal emphysema with scattered calcified and noncalcified pleural plaque formation. The treated left upper lobe lesion is ill-defined and partly obscured by adjacent radiation changes. It appears stable to slightly smaller on the axial images, measuring approximately 1.8 x 1.1 cm on image 24/6 (previously 2.2 x 2.2 cm). However, based on the sagittal images, little change is identified. The lesion crosses the major fissure and extends into superior segment of the lower lobe. There is progressive enlargement of a solid lesion medially in the right upper lobe, lying posterior to the sternum. This measures 1.6 x 0.7 cm on image 31/6 (previously 1.2 x 0.8 cm). No other highly suspicious pulmonary findings are identified. The patchy nodularity and surrounding ground-glass opacities previously seen in the right upper lobe have slightly improved in the interval, favored to be inflammatory. There is minimal similar involvement in the right middle lobe. The previously described cystic lesions in both lungs are stable in size. There is less wall thickening of the left apical lesion (image 9/6), and no current suspicious morphologic features. Musculoskeletal/Chest wall: Interval sclerosis of the previously demonstrated metastases involving the right aspect of T2 vertebral body and left scapula consistent with response to treatment. No new osseous metastases or chest wall mass is identified. CT ABDOMEN AND PELVIS FINDINGS Hepatobiliary: The liver is normal in density without suspicious focal abnormality. No evidence of gallstones, gallbladder wall thickening or biliary dilatation. Pancreas: Unremarkable. No pancreatic ductal dilatation or surrounding inflammatory changes. Spleen: Normal in size without focal abnormality. Adrenals/Urinary Tract: Both adrenal glands appear normal. The kidneys appear stable  without evidence of urinary tract calculus, suspicious lesion or hydronephrosis. There are stable right renal cysts, not requiring follow-up. No bladder abnormalities are seen. Stomach/Bowel: Enteric contrast was administered and has passed into proximal colon. The stomach appears unremarkable for its degree of distension. No evidence of bowel wall thickening, distention or surrounding inflammatory change. Diverticular changes throughout the colon. Vascular/Lymphatic: There are no enlarged abdominal or pelvic lymph nodes. Diffuse aortic and branch vessel atherosclerosis without acute vascular findings. Reproductive: Prostate brachytherapy seeds without evidence of recurrent mass lesion. Other: No evidence of abdominal wall mass or hernia. No ascites. Musculoskeletal: No acute or significant osseous findings. Convex left lumbar scoliosis with associated spondylosis. Cortical lesion involving the right femoral neck is unchanged, and not hypermetabolic on previous PET-CT, presumed incidental benign finding. IMPRESSION: 1. Little change in appearance of the treated lesion in the left upper lobe which crosses the major fissure. No local progression of this lesion identified. 2. A solid nodule medially in the right upper lobe shows further enlargement, suspicious for metastatic disease or a synchronous primary lung cancer. This is new from PET-CT 05/22/2020. No other findings suspicious for progressive metastatic disease. 3. The previously described ill-defined right upper lobe nodularity and surrounding ground-glass opacities have improved, likely inflammatory. Likewise, there is improved wall thickening of the cystic left upper lobe lesion which currently demonstrates no suspicious morphologic features. Continued follow-up recommended. 4. Treatment changes within the previously demonstrated lytic lesions involving the T2 vertebral body and left scapula. No new osseous metastases. 5. 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 Electronically Signed   By: Caryl Comes.D.  On: 06/20/2021 09:11   CT SOFT TISSUE NECK W CONTRAST  Result Date: 06/20/2021 CLINICAL DATA:  Squamous cell carcinoma of lung, head and neck cancer EXAM: CT NECK WITH CONTRAST TECHNIQUE: Multidetector CT imaging of the neck was performed using the standard protocol following the bolus administration of intravenous contrast. RADIATION DOSE REDUCTION: This exam was performed according to the departmental dose-optimization program which includes automated exposure control, adjustment of the mA and/or kV according to patient size and/or use of iterative reconstruction technique. CONTRAST:  21m OMNIPAQUE IOHEXOL 300 MG/ML  SOLN COMPARISON:  03/20/2021 FINDINGS: Pharynx and larynx: Similar appearance of post radiation changes. Salivary glands: Stable appearance with post radiation changes. Thyroid: Unremarkable. Lymph nodes: Necrotic right retropharyngeal lymph node is similar in size. Additional confluent soft tissue with likely enhancing lymph nodes in the right parapharyngeal/carotid space similar to prior study. Enhancing small right level 5 node on image thirty-two of series 3 remains unchanged. Vascular: Significant atherosclerosis again identified with marked calcified plaque along the common carotid bifurcations and proximal internal carotids. There is likely hemodynamically significant stenosis, right greater than left. Right vertebral artery remains surrounded by tumor but is patent. 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 Limited intracranial: No new abnormal enhancement. Visualized orbits: Unremarkable. Mastoids and visualized paranasal sinuses: No significant opacification. Skeleton: Destructive lesion at the right skull base involving clivus and occipital condyle. Extent has mildly increased. There remains no apparent significant intracranial extension. Destructive lesion at the right aspect of C4  with involvement of the posterior elements as well as a portion of the right aspect of C3. Destructive lesion at T2 with pathologic compression fracture. There has been mild progression of destructive change at C3 and C4. Similar epidural tumor without cord compression. Upper chest: Dictated separately. Other: None. IMPRESSION: Mild progression of destructive lesions at the right skull base and right aspect of C4 greater than C3. Remains no significant intracranial extension and epidural disease at the cervical levels has not substantially progressed. Stable necrotic right retropharyngeal lymph node. No new adenopathy. Stable appearance of post radiation changes. Electronically Signed   By: PMacy MisM.D.   On: 06/20/2021 08:32    ASSESSMENT & PLAN:  1. Squamous cell carcinoma of left lung ([place]   2. 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[place]   3. Goals of care, counseling/discussion   4. Neoplasm related pain   5. 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 Tongue base mass extending into right piriform sinus [hypopharynx] cT4 cN2b cM0- Stage IVB p16 negative, July 2021 status post chemoradiation March 2022 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- s/p palliative scapula lesion RT March/April 2022, - s/p concurrent chemoradiation [cisplatin 382mm2]  to his Larynx--Keytruda every 3 weeks maintenance;-->01/16/2021, patient underwent additional  palliative radiation to C-spine.-Continued on Keytruda-- 02/05/2021 CT showed progressive destructive process at C4.-MRI cervical spine confirmed findings.-02/2021 neurosurgery recommended no intervention -January 2023 CT showed progression in lung lesions.-Palliative  carboplatin +Keytruda treatments--> 06/20/2021 CT soft tissue neck showed mild progression of destructive lesion at the right skull base and right aspect of C4 greater than C3.  PET scan confirmed disease progression. -Goals of care was reviewed and discussed with patient.  He continues to have weight loss and  decline in functional status.  We discussed about proceeding with hospice at this point due to his poor performance status.  Patient's son was called during the encounter.  Son and wife are interested in additional chemotherapy treatments.  They understand that there is no guarantee that additional chemotherapy may be effective, and they understand that patient is at risk of developing chemotherapy related complications and may further deteriorate.  Son is interested in patient trying 1 or 2 doses of dose reduced chemotherapy to see if he is able to tolerate.  If not, family is interested in proceeding with hospice.   I discussed about the rationale potential side effects of Taxol weekly.  In his case, I will offer dose reduced Taxol every 2 weeks.  Patient has a family reunion plan at the end of June.  Family would like to start patient's treatment after July 4th holiday.  #Bone metastasis, we have previously discussed about bisphosphonate, recommend dental clearance which patient was not able to obtain due to need of tooth extractions.  Currently due to his decreased performance status, potential need of hospice enrollment in the near future, hold off bisphosphonate.Marland Kitchen  #Left upper lobe squamous cell carcinoma, stage I Finished SBRT on 02/07/2020.-Stable. A solid nodule immediately in the right upper lobe shows further  enlargement. Continue observation.  #Right jugular vein thrombus, with his cancer progression, continue Eliquis 2.5 mg twice daily for prophylaxis.  #Weight loss.   Patient follows up with nutritionist.  #Right shoulder pain.  Oxycodone 5 mg every 8 hours as needed for pain  All questions were answered. The patient knows to call the clinic with any problems questions or concerns.   Return of visit: Appointment with radiation oncology. TBD.   Earlie Server, MD, PhD 08/06/2021

## 2021-08-06 NOTE — Assessment & Plan Note (Signed)
Discussed with patient

## 2021-08-06 NOTE — Assessment & Plan Note (Signed)
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- s/p palliative scapula lesion RT March/April 2022, - s/p concurrent chemoradiation [cisplatin 30mg /m2]  to his Larynx--Keytruda every 3 weeks maintenance;-->01/16/2021, patient underwent additional palliative radiation to C-spine.-Continued on Keytruda-- 02/05/2021 CT showed progressive destructive process at C4.-MRI cervical spine confirmed findings.-02/2021 neurosurgery recommended no intervention -January 2023 CT showed progression in lung lesions.-Palliative carboplatin +Keytruda treatments--> 06/20/2021 CT soft tissue neck showed mild progression of destructive lesion at the right skull base and right aspect of C4 greater than C3. Discussed with Dr. Cari Caraway.  No surgical intervention needed. Switch to Taxol treatment.

## 2021-08-06 NOTE — Telephone Encounter (Signed)
Spoke with patient's wife Gregory Burton regarding the Palliative referral/services and all questions were answered and she was in agreement with beginning Palliative services with patient.  I have scheduled a Telehealth Consult for 08/09/21 @ 1:30 PM

## 2021-08-06 NOTE — Progress Notes (Signed)
[place] at [facility] [phone] [phone]   Name: Gregory Burton Date: 08/06/2021 MRN: [id number]  DOB: 10-06-1940  Patient Care Team: Casilda Carls, MD as PCP - General (Internal Medicine) Earlie Server, MD as Consulting Physician (Oncology)    REASON FOR CONSULTATION: Gregory Burton is a 81 y.o. 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  Palliative care was consulted help address goals and manage ongoing symptoms.  SOCIAL HISTORY:     reports that he quit smoking about 13 years ago. His smoking use included cigarettes. He has never used smokeless tobacco. He reports current alcohol use. He reports that he does not use drugs.  Patient is married and lives at home with his wife.  They have a son who is involved in patient's care.  Patient had a variety of jobs including [employer].  ADVANCE DIRECTIVES: None on file  CODE STATUS: Limited Code (No CPR, MOST form signed on 08/06/21)  PAST MEDICAL HISTORY: Past Medical History:  Diagnosis Date   Alcohol abuse    Blood transfusion without reported diagnosis 2013   Cataract    Dementia ([place])    Glaucoma    Gout    History of internal jugular thrombosis    Hypercholesteremia    Hypertension    Larynx cancer ([place]) 07/14/2020   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[place]) 02/2019   Prostate CA ([place]) 2008   Squamous cell lung cancer ([place]) 06/23/2019    PAST SURGICAL HISTORY:  Past Surgical History:  Procedure Laterality Date   BRAIN SURGERY  2012   HERNIA REPAIR     PORTA CATH INSERTION N/A 07/27/2019   Procedure: PORTA CATH INSERTION;  Surgeon: Katha Cabal, MD;  Location: [facility];  Service: Cardiovascular;  Laterality: N/A;    HEMATOLOGY/ONCOLOGY HISTORY:  Oncology History  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[place])   06/22/2019 Initial Diagnosis   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[place])   06/23/2019 Cancer Staging   Staging form: Pharynx - P16 Negative Oropharynx, AJCC 8th Edition - Clinical stage from 06/23/2019: Stage IVB (cT4b, cN2b, cM0, p16-) - Signed by Earlie Server, MD on 06/23/2019   07/19/2019 - 08/18/2019 Chemotherapy         05/22/2020 - 05/22/2020 Chemotherapy         Squamous cell lung cancer ([place])  06/23/2019 Initial Diagnosis   Squamous cell lung cancer ([place])   07/19/2019 Cancer Staging   Staging form: Lung, AJCC 8th Edition - Clinical: cT1, cN0, cM0 - Signed by Earlie Server, MD on 07/19/2019   Larynx cancer ([place])  07/14/2020 Initial Diagnosis   Larynx cancer ([place])   07/26/2020 - 09/06/2020 Chemotherapy    Patient is on Treatment Plan: HEAD/NECK PEMBROLIZUMAB Q21D      07/26/2020 Cancer Staging   Staging form: Larynx - Glottis, AJCC 8th Edition - Clinical: Stage IVC (cT1, cNX, cM1) - Signed by Earlie Server, MD on 07/26/2020   10/04/2020 -  Chemotherapy   Patient is on Treatment Plan : HEAD/NECK Pembrolizumab Q21D       ALLERGIES:  is allergic to ace inhibitors and enalapril.  MEDICATIONS:  Current Outpatient Medications  Medication Sig Dispense Refill   atorvastatin (LIPITOR) 20 MG tablet Take 20 mg by mouth daily.     colchicine 0.6 MG tablet Take 0.6 mg by mouth daily. SMARTSIG:1 Tablet(s) By Mouth Daily (Patient not taking: Reported on 02/06/2021)  donepezil (ARICEPT) 10 MG tablet Take 10 mg by mouth at bedtime.     dorzolamide-timolol (COSOPT) 22.3-6.8 MG/ML ophthalmic solution Place 1 drop into both eyes 2 (two) times daily.      ELIQUIS 2.5 MG TABS tablet TAKE 1 TABLET BY MOUTH TWICE A DAY 60 tablet 1   folic acid (FOLVITE) 1 MG tablet Take 1 mg by mouth daily.     latanoprost (XALATAN) 0.005 % ophthalmic solution Place 1 drop into both eyes at bedtime.      lidocaine-prilocaine (EMLA) cream Apply 1 application topically as needed. Apply small amount to port site approx 1-2 hours prior to  appointment. 30 g 1   MAG64 64 MG TBEC TAKE 1 TABLET (64 MG TOTAL) BY MOUTH 2 (TWO) TIMES DAILY. 180 tablet 0   megestrol (MEGACE) 40 MG tablet Take 1 tablet by mouth 2 (two) times daily.     Multiple Vitamins-Minerals (CENTRUM ADULTS PO) Take by mouth.     nystatin (MYCOSTATIN) 100000 UNIT/ML suspension Take 5 mLs (500,000 Units total) by mouth 4 (four) times daily. 480 mL 3   oxyCODONE (OXY IR/ROXICODONE) 5 MG immediate release tablet Take 1 tablet (5 mg total) by mouth every 8 (eight) hours as needed for severe pain or moderate pain. 90 tablet 0   prochlorperazine (COMPAZINE) 10 MG tablet Take 1 tablet (10 mg total) by mouth every 8 (eight) hours as needed for nausea or vomiting. 60 tablet 0   Thiamine HCl (VITAMIN B-1 PO) Take 100 mg by mouth daily.     No current facility-administered medications for this visit.   Facility-Administered Medications Ordered in Other Visits  Medication Dose Route Frequency Provider Last Rate Last Admin   sodium chloride flush (NS) 0.9 % injection 10 mL  10 mL Intravenous PRN Earlie Server, MD   10 mL at 03/01/20 0623    VITAL SIGNS: There were no vitals taken for this visit. There were no vitals filed for this visit.   Estimated body mass index is 13.36 kg/m as calculated from the following:   Height as of 07/19/21: 5\' 9"  (1.753 m).   Weight as of an earlier encounter on 08/06/21: 90 lb 8 oz (41.1 kg).  LABS: CBC:    Component Value Date/Time   WBC 6.0 07/10/2021 0824   HGB 10.9 (L) 07/10/2021 0824   HCT 34.3 (L) 07/10/2021 0824   PLT 196 07/10/2021 0824   MCV 96.6 07/10/2021 0824   NEUTROABS 4.5 07/10/2021 0824   LYMPHSABS 0.7 07/10/2021 0824   MONOABS 0.7 07/10/2021 0824   EOSABS 0.1 07/10/2021 0824   BASOSABS 0.0 07/10/2021 0824   Comprehensive Metabolic Panel:    Component Value Date/Time   NA 147 (H) 07/10/2021 0824   K 3.8 07/10/2021 0824   CL 109 07/10/2021 0824   CO2 27 07/10/2021 0824   BUN 27 (H) 07/10/2021 0824   CREATININE 0.83  07/10/2021 0824   GLUCOSE 122 (H) 07/10/2021 0824   CALCIUM 9.3 07/10/2021 0824   AST 22 07/10/2021 0824   ALT 17 07/10/2021 0824   ALKPHOS 101 07/10/2021 0824   BILITOT 0.6 07/10/2021 0824   PROT 7.6 07/10/2021 0824   ALBUMIN 3.4 (L) 07/10/2021 0824    RADIOGRAPHIC STUDIES: NM PET Image Restag (PS) Skull Base To Thigh  Result Date: 07/20/2021 CLINICAL DATA:  Subsequent treatment strategy for squamous cell carcinoma the left lung. Oropharyngeal cancer. EXAM: NUCLEAR MEDICINE PET SKULL BASE TO THIGH TECHNIQUE: 5.7 mCi F-18 FDG was injected intravenously. Full-ring PET  imaging was performed from the skull base to thigh after the radiotracer. CT data was obtained and used for attenuation correction and anatomic localization. Fasting blood glucose: 90 mg/dl COMPARISON:  Multiple exams, including CT scans of 06/19/2021 and prior PET-CT from 05/22/2020 FINDINGS: Mediastinal blood pool activity: SUV max 1.8 Liver activity: SUV max NA NECK: Centrally necrotic right retropharyngeal lymph node anterior to the arch of C1, maximum SUV 4.6 (formerly 3.3). A small right level IIb lymph node 0.4 cm in short axis on image 28 series 2, maximum SUV 5.2 (formerly 2.4). Tiny focus of activity in the vicinity of the left cricoid lamina/left inferior constrictor, maximum SUV 4.0, no CT correlate. Incidental CT findings: Dense bilateral carotid atherosclerotic calcification. CHEST: Anteromedial right upper lobe nodule 1.8 by 0.8 cm on image 91 of series 2 with maximum SUV 2.1, similar morphology to the 06/19/2021 exam, and not present on the prior PET-CT. The scarring and local airspace opacity spanning across the major fissure in the left upper lobe and left lower lobe laterally has a maximum SUV of about 1.4, previously 3.0. The clustered ground-glass opacity and tree-in-bud reticulonodular opacities in the right middle lobe are not substantially hypermetabolic. Similar opacities posteriorly in the right upper lobe are not  substantially hypermetabolic and are likely inflammatory. Incidental CT findings: Right Port-A-Cath tip: Right atrium. Coronary, aortic arch, and branch vessel atherosclerotic vascular disease. ABDOMEN/PELVIS: No significant abnormal hypermetabolic activity in this region. Incidental CT findings: Atherosclerosis is present, including aortoiliac atherosclerotic disease. Sigmoid colon diverticulosis. Brachytherapy seed implants in the prostate gland. Incidental finding of FDG extravasation along the left distal forearm. SKELETON: Hypermetabolic mass involving the right occipital bone and clivus with roughly similar appearance to 06/19/21, maximum SUV 8.9. This lesion is new from the prior PET-CT. New lytic lesion of the right C3 and C4 vertebra compared to prior PET-CT, maximum SUV 9.2. Other remote osseous lesions such as the right lytic lesion at T2 are not currently hypermetabolic. Incidental CT findings: Lumbar spondylosis and degenerative disc disease. IMPRESSION: 1. Currently active osseous metastatic disease in the right skull base, along with a new substantial lytic lesion of the right side of the C3 and C4 vertebra. Older bony metastatic lesions are not currently hypermetabolic. 2. Currently in the neck the centrally necrotic right retropharyngeal lymph node has maximum SUV of 4.6 (formerly 3.3) and a small right level IIb lymph node has a maximum SUV of 5.2. 3. Tiny focus of activity in the vicinity of the left inferior constrictor/left cricoid lamina, no CT correlate. 4. The recently seen right upper lobe medial nodule has a maximum SUV of 2.1, nonspecific for malignant versus inflammatory lesion. No other activity concerning for active malignancy in the chest, abdomen, or pelvis. 5. Other imaging findings of potential clinical significance: Aortic Atherosclerosis (ICD10-I70.0). Coronary atherosclerosis. Lumbar spondylosis and degenerative disc disease. Electronically Signed   By: Van Clines M.D.    On: 07/20/2021 14:39    PERFORMANCE STATUS (ECOG) : 2 - Symptomatic, <50% confined to bed  Review of Systems Unless otherwise noted, a complete review of systems is negative.  Physical Exam General: NAD Pulmonary: Unlabored Extremities: no edema, no joint deformities Skin: no rashes Neurological: Weakness but otherwise nonfocal  IMPRESSION: Follow-up visit.  Patient was accompanied by his wife.  Son participated in visit via phone.  I spoke with patient after their visit with [name].  Patient and family have opted to pursue further chemotherapy but would like to do so after the July 4  holiday when patient returns from traveling to the beach.  Hospice has also been discussed as an option given the high risk of poor tolerance with further treatments.  Symptomatically, patient endorses poor oral intake and mouth pain.  He has oxycodone that he uses sparingly.  Discussed viscous lidocaine but wife was not interested.  Wife brought ACP documents and a MOST form.  She requested to complete the MOST form today.  Patient family would be interested in short-term hospitalization if needed to treat the treatable.  They are not interested in CPR but would be agreeable to all other life-prolonging measures including a ventilator and feeding tube.  I completed a MOST form today. The patient and family outlined their wishes for the following treatment decisions:  Cardiopulmonary Resuscitation: Do Not Attempt Resuscitation (DNR/No CPR)  Medical Interventions: Full Scope of Treatment: Use intubation, advanced airway interventions, mechanical ventilation, cardioversion as indicated, medical treatment, IV fluids, etc, also provide comfort measures. Transfer to the hospital if indicated  Antibiotics: Antibiotics if indicated  IV Fluids: IV fluids if indicated  Feeding Tube: Feeding tube long-term if indicated    PLAN: -Continue current scope of treatment -Referral to community palliative care -Limited  code (no CPR, okay intubation) -MOST Form completed -RTC 3 to 4 weeks   Patient expressed understanding and was in agreement with this plan. He also understands that He can call the clinic at any time with any questions, concerns, or complaints.     Time Total: 15 minutes  Visit consisted of counseling and education dealing with the complex and emotionally intense issues of symptom management and palliative care in the setting of serious and potentially life-threatening illness.Greater than 50%  of this time was spent counseling and coordinating care related to the above assessment and plan.  Signed by: Altha Harm, PhD, NP-C

## 2021-08-06 NOTE — Progress Notes (Signed)
Pt here for follow up. Pt's wife concerned that pt keeps losing weight, but is eating well.

## 2021-08-06 NOTE — Progress Notes (Signed)
ON PATHWAY REGIMEN - Head and Neck  No Change  Continue With Treatment as Ordered.  Original Decision Date/Time: 09/06/2020 12:45     A cycle is every 21 days:     Pembrolizumab   **Always confirm dose/schedule in your pharmacy ordering system**  Patient Characteristics: Larynx, Metastatic, First Line, Prior Platinum-Based Chemoradiation within 6 Months, Candidate for Checkpoint Inhibitor Disease Classification: Larynx AJCC T Category: [place] AJCC 8 Stage Grouping: IVC Therapeutic Status: Metastatic Disease AJCC N Category: NX AJCC M Category: M1 Line of Therapy: First Line Prior Platinum Status: Prior Platinum-Based Chemoradiation within 6 Months Checkpoint Inhibitor Candidacy: Candidate for Checkpoint Inhibitor Intent of Therapy: Non-Curative / Palliative Intent, Discussed with Patient

## 2021-08-06 NOTE — Progress Notes (Signed)
DISCONTINUE ON PATHWAY REGIMEN - Head and Neck     A cycle is every 21 days:     Pembrolizumab   **Always confirm dose/schedule in your pharmacy ordering system**  REASON: Disease Progression PRIOR TREATMENT: [id number]: Pembrolizumab 200 mg q21 Days for up to 24 Months TREATMENT RESPONSE: Progressive Disease (PD)  START ON PATHWAY REGIMEN - Head and Neck     Administer weekly:     Paclitaxel   **Always confirm dose/schedule in your pharmacy ordering system**  Patient Characteristics: Oropharynx, HPV Negative/Unknown, Metastatic, Second Line Disease Classification: Oropharynx HPV Status: Negative (-) Therapeutic Status: Metastatic Disease Line of Therapy: Second Line  Intent of Therapy: Non-Curative / Palliative Intent, Discussed with Patient

## 2021-08-09 ENCOUNTER — Encounter: Payer: Self-pay | Admitting: Nurse Practitioner

## 2021-08-09 ENCOUNTER — Other Ambulatory Visit: Payer: Medicare Other | Admitting: Nurse Practitioner

## 2021-08-09 DIAGNOSIS — G893 Neoplasm related pain (acute) (chronic): Secondary | ICD-10-CM

## 2021-08-09 DIAGNOSIS — Z515 Encounter for palliative care: Secondary | ICD-10-CM

## 2021-08-09 DIAGNOSIS — R630 Anorexia: Secondary | ICD-10-CM

## 2021-08-09 NOTE — Progress Notes (Signed)
[facility] Consult Note Telephone: [phone]  Fax: [phone]   Date of encounter: 08/09/21 3:38 PM PATIENT NAME: Gregory Burton [address] [place] [address]   [phone] (home)  DOB: 1940-05-31 MRN: [id number] PRIMARY CARE PROVIDER:    Casilda Carls, MD,  [place] [address] [phone]  REFERRING PROVIDER:   Casilda Burton, [place],  [place] [address] [phone]  RESPONSIBLE PARTY:    Contact Information     Name Relation Home Work Mobile   Gregory, Burton Spouse [phone]  [phone]       Due to the COVID-19 crisis, this visit was done via telemedicine from my office and it was initiated and consent by this patient and or family.  I connected with Gregory Burton with Gregory Burton OR PROXY on 08/09/21 by telephones as video not available enabled telemedicine application and verified that I am speaking with the correct person using two identifiers.   I discussed the limitations of evaluation and management by telemedicine. The patient expressed understanding and agreed to proceed. . Palliative Care was asked to follow this patient by consultation request of  Gregory Carls, MD to address advance care planning and complex medical decision making. This is the initial visit.            ASSESSMENT AND PLAN / RECOMMENDATIONS:  Symptom Management/Plan: 1. Advance Care Planning;  DNR; MOST form in Vynca to include DNR; Full scope of treatment, wishes are for antibiotics, IVF, feeding tube if indicated; signed on 08/06/21)  2. Goals of Care: Goals include to maximize quality of life and symptom management. Our advance care planning conversation included a discussion about:    The value and importance of advance care planning  Exploration of personal, cultural or spiritual beliefs that might influence medical decisions  Exploration of goals of care in the event of a sudden  injury or illness  Identification and preparation of a healthcare agent  Review and updating or creation of an advance directive document.  3. 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 We talked about nutrition, supplements, education done.  03/23/2021 weight 102 lbs 06/19/2021 weight 96 lbs 07/30/2021 weight 94 lbs BMI 13.88 Labs reviewed 07/10/2021 albumin 3.4; total protein 7.6  4. 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 Continue with oxycodone 5mg  q8hrs as needed  5. Palliative care encounter; Palliative care encounter; Palliative medicine team will continue to support patient, patient's family, and medical team. Visit consisted of counseling and education dealing with the complex and emotionally intense issues of symptom management and palliative care in the setting of serious and potentially life-threatening illness Follow up Palliative Care Visit: Palliative care will continue to follow for complex medical decision making, advance care planning, and clarification of goals. Return 6 weeks or prn.  I spent 35 minutes providing this consultation. More than 50% of the time in this consultation was spent in counseling and care coordination. PPS: 40% Chief Complaint: Initial palliative consult for complex medical decision making, address goals, manage ongoing symptoms HISTORY OF PRESENT ILLNESS:  Gregory Burton is a 81 y.o. 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  I called Gregory Burton by telephone for initial telemedicine palliative consult as video not available. We talked about the last time Gregory Burton was independent, past  medical history, dx cancer with current treatment. Gregory Burton endorses Gregory Burton has an upcoming appointment with Gregory Burton, Oncologist to see if there is any further interventions. We talked about ros, symptoms including poor oral intake. We talked about diet, nutrition education done. Gregory Burton endorses at times Gregory Burton has gum pain but not in the last few days. We talked about Gregory Burton functional ability, requires assistance with ADL's. Mobility. We talked about no immediate needs just waiting to see what Gregory Burton has to offer. Ms. Burton endorses they are just about out of options for treatment. We talked about medical goals, quality of life. We talked about f/u pc visit, scheduled. Therapeutic listening, emotional support provided. Questions answered. Contact information given.     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[facility])    06/23/2019 Cancer Staging    Staging form: Pharynx - P16 Negative Oropharynx, AJCC 8th Edition - Clinical stage from 06/23/2019: Stage IVB (cT4b, cN2b, cM0, p16-) - Signed by Gregory Server, MD on 06/23/2019    07/19/2019 - 08/18/2019 Chemotherapy             05/22/2020 - 05/22/2020 Chemotherapy             Squamous cell lung cancer ([facility])  06/23/2019 Initial Diagnosis    Squamous cell lung cancer ([facility])    07/19/2019 Cancer Staging    Staging form: Lung, AJCC 8th Edition - Clinical: cT1, cN0, cM0 - Signed by Gregory Server, MD on 07/19/2019    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  I reviewed available labs, medications, imaging, studies and related documents from the EMR.  Records reviewed and summarized above.   ROS 10 point system reviewed with Ms. 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[facility]) 07/14/2020   Dementia without behavioral disturbance ([facility]) 07/05/2020   Bone metastasis 05/26/2020   Neoplasm related pain 05/26/2020   Hypomagnesemia 05/26/2020   Dysuria 11/15/2019   Encounter for antineoplastic chemotherapy 08/11/2019   Weight loss 08/11/2019   Non-intractable vomiting 08/11/2019   Squamous cell carcinoma of left lung ([facility]) 08/04/2019   Goals of care, counseling/discussion 06/23/2019   Encounter for audiology evaluation 06/23/2019   Squamous cell lung cancer ([facility]) 06/23/2019   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/2019   Alcohol abuse 05/14/2019   Other fatigue 05/14/2019   Neck mass 05/14/2019   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[facility]) 05/14/2019   Former smoker 05/14/2019   PAST MEDICAL HISTORY:  Active Ambulatory Problems    Diagnosis Date Noted   Alcohol abuse 05/14/2019  Other fatigue 05/14/2019   Neck mass 05/14/2019   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[facility]) 05/14/2019   Former smoker 05/14/2019   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   Goals of care, counseling/discussion 06/23/2019   Encounter for audiology evaluation 06/23/2019   Squamous cell lung cancer ([facility]) 06/23/2019   Squamous cell carcinoma of left lung ([facility]) 08/04/2019   Encounter for antineoplastic chemotherapy 08/11/2019   Weight loss 08/11/2019   Non-intractable vomiting 08/11/2019   Dysuria 11/15/2019   Bone metastasis 05/26/2020   Neoplasm related pain 05/26/2020   Hypomagnesemia 05/26/2020   Dementia without behavioral disturbance ([facility]) 07/05/2020    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)    Glaucoma    Gout    History of internal jugular thrombosis    Hypercholesteremia    Hypertension    Prostate CA ([facility]) 2008   SOCIAL HX:  Social History   Tobacco Use   Smoking status: Former    Years: 21.00    Types: Cigarettes    Quit date: 05/11/2008    Years since quitting: 13.2   Smokeless tobacco: Never  Substance Use Topics   Alcohol use: Yes    Comment: 0.5 pint liquor a week   FAMILY HX: reviewed  ALLERGIES:  Allergies  Allergen Reactions   Ace Inhibitors Rash and Swelling   Enalapril Rash     PERTINENT MEDICATIONS:  Outpatient Encounter Medications as of 08/09/2021  Medication Sig   atorvastatin (LIPITOR) 20 MG tablet Take 20 mg by mouth daily.   colchicine 0.6 MG tablet Take 0.6 mg by mouth daily. SMARTSIG:1 Tablet(s) By Mouth Daily (Patient not taking: Reported on 02/06/2021)   donepezil (ARICEPT) 10 MG tablet Take 10 mg by mouth at bedtime.   dorzolamide-timolol (COSOPT) 22.3-6.8 MG/ML ophthalmic solution Place 1 drop into both eyes 2 (two) times daily.    ELIQUIS 2.5 MG TABS tablet TAKE 1 TABLET BY MOUTH TWICE A DAY   folic acid (FOLVITE) 1 MG tablet Take 1 mg by mouth daily.   latanoprost (XALATAN) 0.005 % ophthalmic solution Place 1 drop into both eyes at bedtime.    lidocaine-prilocaine (EMLA) cream Apply 1 application topically as needed. Apply small amount to port site approx 1-2 hours prior to appointment.   MAG64 64 MG TBEC TAKE 1 TABLET (64 MG TOTAL) BY MOUTH 2 (TWO) TIMES DAILY.   megestrol (MEGACE) 40 MG tablet Take 1 tablet by mouth 2 (two) times daily.   Multiple Vitamins-Minerals (CENTRUM ADULTS PO) Take by mouth.   nystatin (MYCOSTATIN) 100000  UNIT/ML suspension Take 5 mLs (500,000 Units total) by mouth 4 (four) times daily.   oxyCODONE (OXY IR/ROXICODONE) 5 MG immediate release tablet Take 1 tablet (5 mg total) by mouth every 8 (eight) hours as needed for severe pain or moderate pain.   prochlorperazine (COMPAZINE) 10 MG tablet Take 1 tablet (10 mg total) by mouth every 8 (eight) hours as needed for nausea or vomiting.   Thiamine HCl (VITAMIN B-1 PO) Take 100 mg by mouth daily.   Facility-Administered Encounter Medications as of 08/09/2021  Medication   sodium chloride flush (NS) 0.9 % injection 10 mL   Thank you for the opportunity to participate in the care of Mr. Decoste.  The palliative care team  will continue to follow. Please call our office at [phone] if we can be of additional assistance.   Cherith Tewell Z Brook Geraci, NP ,

## 2021-08-10 ENCOUNTER — Other Ambulatory Visit: Payer: Self-pay

## 2021-08-10 ENCOUNTER — Emergency Department: Payer: Medicare Other

## 2021-08-10 ENCOUNTER — Emergency Department
Admission: EM | Admit: 2021-08-10 | Discharge: 2021-08-10 | Disposition: A | Discharge status: Home / Self Care | Payer: Medicare Other | Attending: Emergency Medicine | Admitting: Emergency Medicine

## 2021-08-10 DIAGNOSIS — E860 Dehydration: Secondary | ICD-10-CM

## 2021-08-10 DIAGNOSIS — Z85818 Personal history of malignant neoplasm of other sites of lip, oral cavity, and pharynx: Secondary | ICD-10-CM | POA: Insufficient documentation

## 2021-08-10 DIAGNOSIS — R531 Weakness: Secondary | ICD-10-CM

## 2021-08-10 DIAGNOSIS — F0390 Unspecified dementia without behavioral disturbance: Secondary | ICD-10-CM | POA: Insufficient documentation

## 2021-08-10 DIAGNOSIS — Z85118 Personal history of other malignant neoplasm of bronchus and lung: Secondary | ICD-10-CM | POA: Insufficient documentation

## 2021-08-10 DIAGNOSIS — I10 Essential (primary) hypertension: Secondary | ICD-10-CM | POA: Insufficient documentation

## 2021-08-10 LAB — CBC WITH DIFFERENTIAL/PLATELET
Abs Immature Granulocytes: 0.05 10*3/uL (ref 0.00–0.07)
Basophils Absolute: 0 10*3/uL (ref 0.0–0.1)
Basophils Relative: 0 %
Eosinophils Absolute: 0.1 10*3/uL (ref 0.0–0.5)
Eosinophils Relative: 1 %
HCT: 38.7 % — ABNORMAL LOW (ref 39.0–52.0)
Hemoglobin: 11.9 g/dL — ABNORMAL LOW (ref 13.0–17.0)
Immature Granulocytes: 1 %
Lymphocytes Relative: 6 %
Lymphs Abs: 0.6 10*3/uL — ABNORMAL LOW (ref 0.7–4.0)
MCH: 31.8 pg (ref 26.0–34.0)
MCHC: 30.7 g/dL (ref 30.0–36.0)
MCV: 103.5 fL — ABNORMAL HIGH (ref 80.0–100.0)
Monocytes Absolute: 0.6 10*3/uL (ref 0.1–1.0)
Monocytes Relative: 7 %
Neutro Abs: 7.9 10*3/uL — ABNORMAL HIGH (ref 1.7–7.7)
Neutrophils Relative %: 85 %
Platelets: 197 10*3/uL (ref 150–400)
RBC: 3.74 MIL/uL — ABNORMAL LOW (ref 4.22–5.81)
RDW: 16.1 % — ABNORMAL HIGH (ref 11.5–15.5)
WBC: 9.3 10*3/uL (ref 4.0–10.5)
nRBC: 0 % (ref 0.0–0.2)

## 2021-08-10 LAB — COMPREHENSIVE METABOLIC PANEL
ALT: 12 U/L (ref 0–44)
AST: 15 U/L (ref 15–41)
Albumin: 3.2 g/dL — ABNORMAL LOW (ref 3.5–5.0)
Alkaline Phosphatase: 66 U/L (ref 38–126)
Anion gap: 7 (ref 5–15)
BUN: 23 mg/dL (ref 8–23)
CO2: 28 mmol/L (ref 22–32)
Calcium: 9.7 mg/dL (ref 8.9–10.3)
Chloride: 115 mmol/L — ABNORMAL HIGH (ref 98–111)
Creatinine, Ser: 0.75 mg/dL (ref 0.61–1.24)
GFR, Estimated: >60 mL/min (ref >60)
Glucose, Bld: 114 mg/dL — ABNORMAL HIGH (ref 70–99)
Potassium: 3.7 mmol/L (ref 3.5–5.1)
Sodium: 150 mmol/L — ABNORMAL HIGH (ref 135–145)
Total Bilirubin: 0.9 mg/dL (ref 0.3–1.2)
Total Protein: 7.7 g/dL (ref 6.5–8.1)

## 2021-08-10 LAB — URINALYSIS, ROUTINE W REFLEX MICROSCOPIC
Bilirubin Urine: NEGATIVE
Glucose, UA: NEGATIVE mg/dL
Hgb urine dipstick: NEGATIVE
Ketones, ur: NEGATIVE mg/dL
Leukocytes,Ua: NEGATIVE
Nitrite: NEGATIVE
Protein, ur: NEGATIVE mg/dL
Specific Gravity, Urine: 1.021 (ref 1.005–1.030)
pH: 5 (ref 5.0–8.0)

## 2021-08-10 LAB — LIPASE, BLOOD: Lipase: 23 U/L (ref 11–51)

## 2021-08-10 IMAGING — CT CT HEAD W/O CM
4 series · 16 of 47 positions shown, 18 images · non-contrast
Comparison: Brain MRI [DATE]

CLINICAL DATA: Altered mental status metastatic disease evaluation



[Series 2: head wo · axial · 0.43mm/px · z∈[-126,-6]mm · 7 of 32 slices shown, 9 images]
[im 4/32  brain]
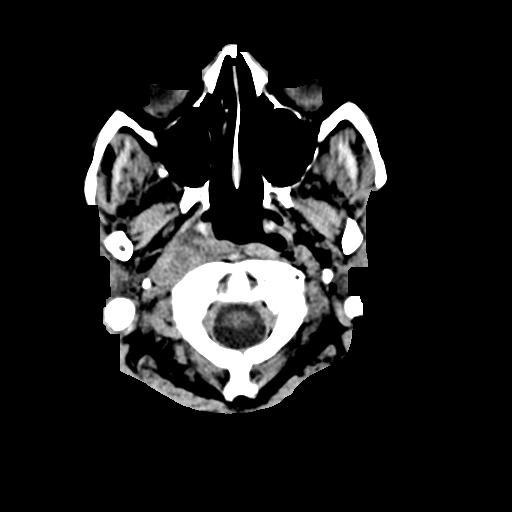
[im 4/32  bone]
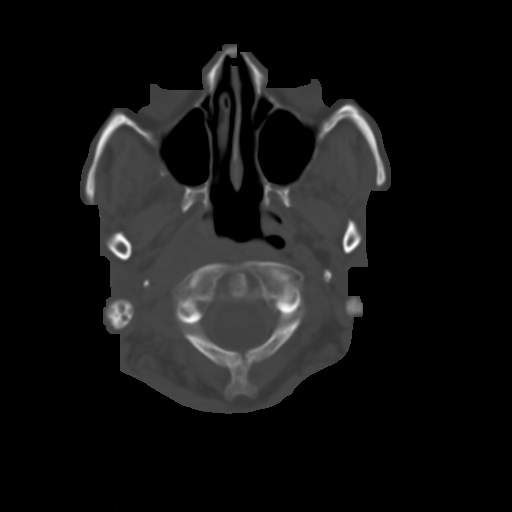
[im 8/32  brain]
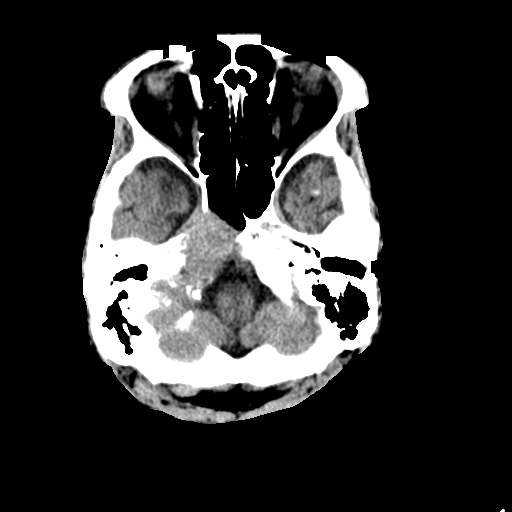
[im 12/32  brain]
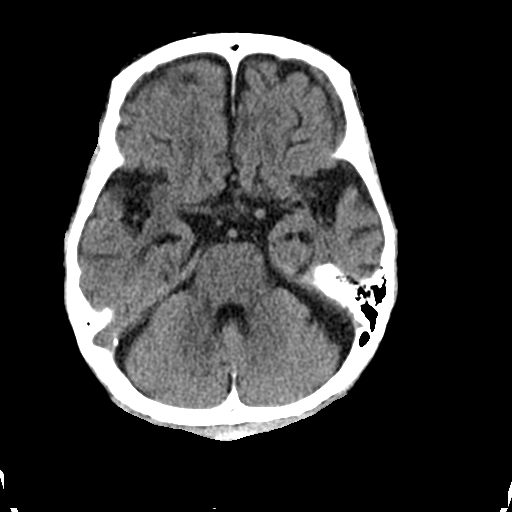
[im 16/32  brain]
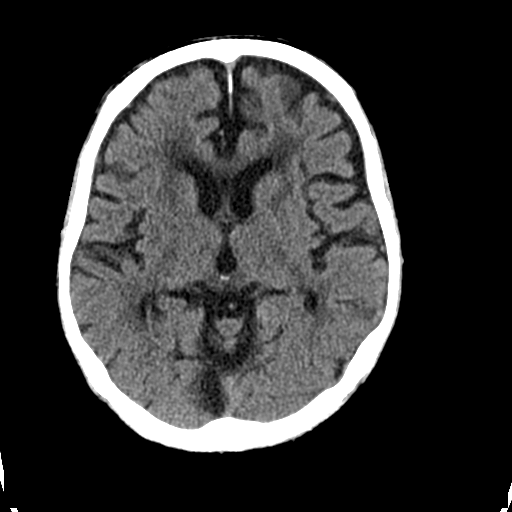
[im 20/32  brain]
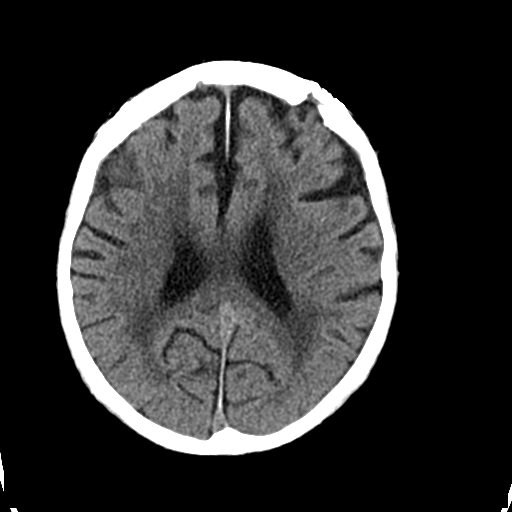
[im 20/32  bone]
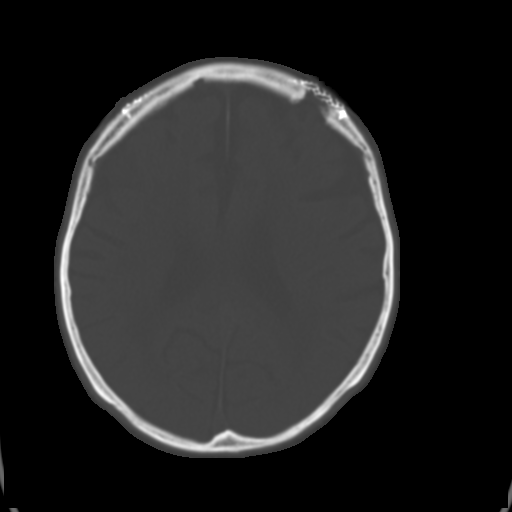
[im 24/32  brain]
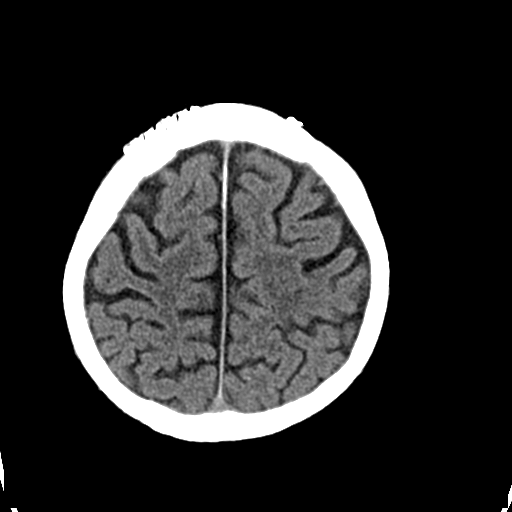
[im 28/32  brain]
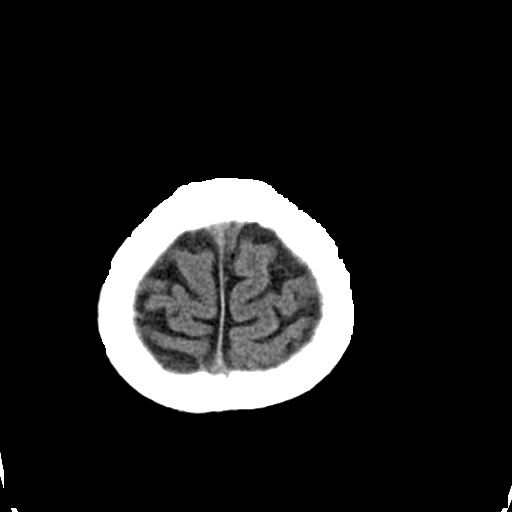

[Series 3: head bone · axial · 0.43mm/px · z∈[-127,-95]mm · 3 of 78 slices shown]
[im 8/78  bone]
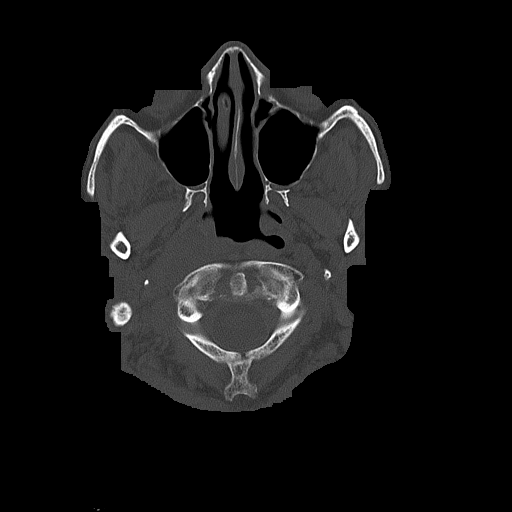
[im 16/78  bone]
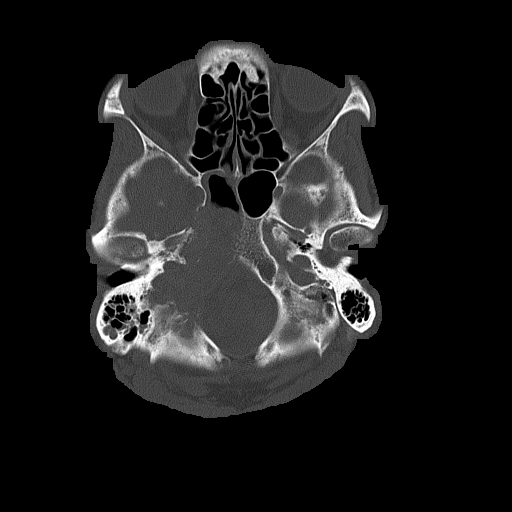
[im 24/78  bone]
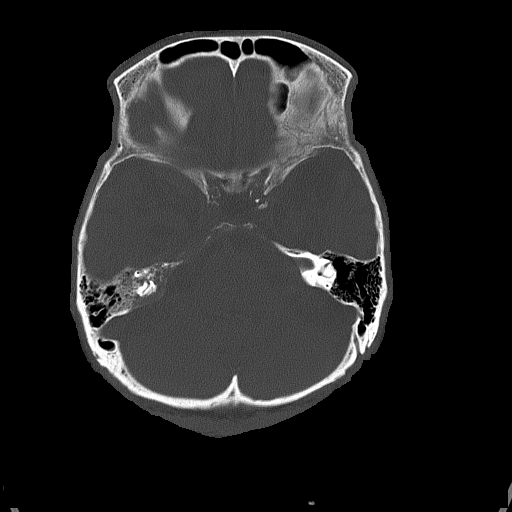

[Series 4: coronal soft tissue · coronal · 0.32mm/px · 3 of 64 slices shown]
[im 22/64  brain]
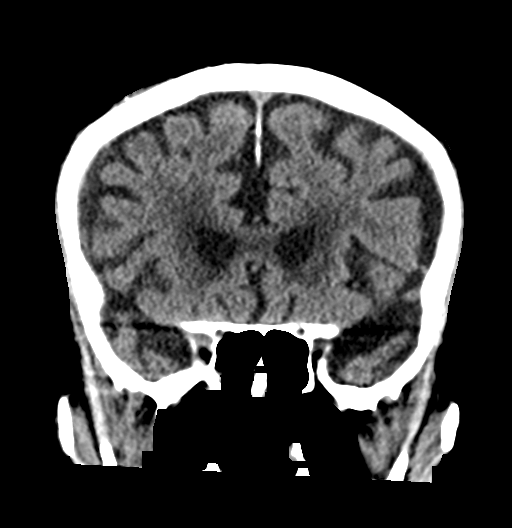
[im 29/64  brain]
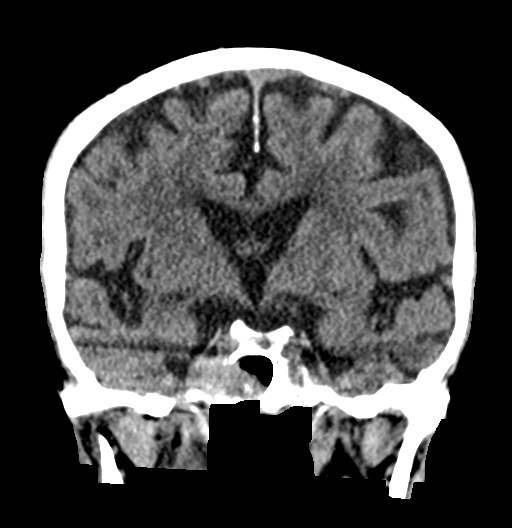
[im 36/64  brain]
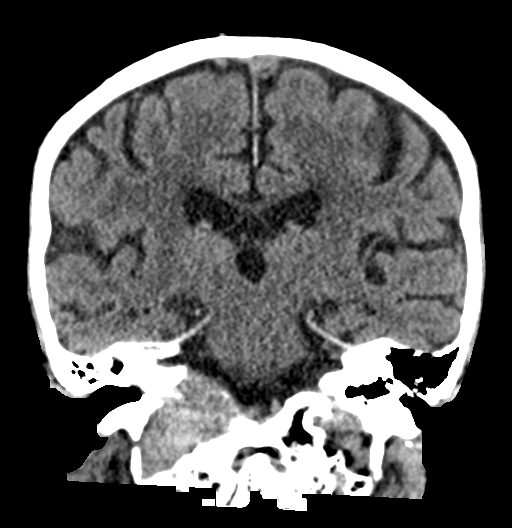

[Series 5: sagittal soft tissue · sagittal · 0.35mm/px · 3 of 56 slices shown]
[im 19/56  brain]
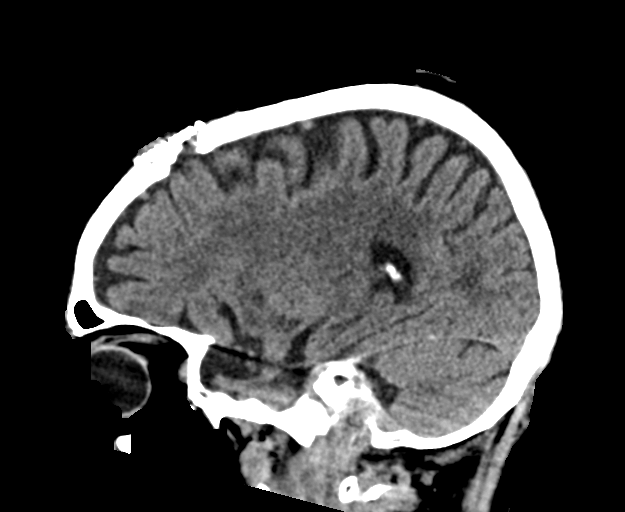
[im 28/56  brain]
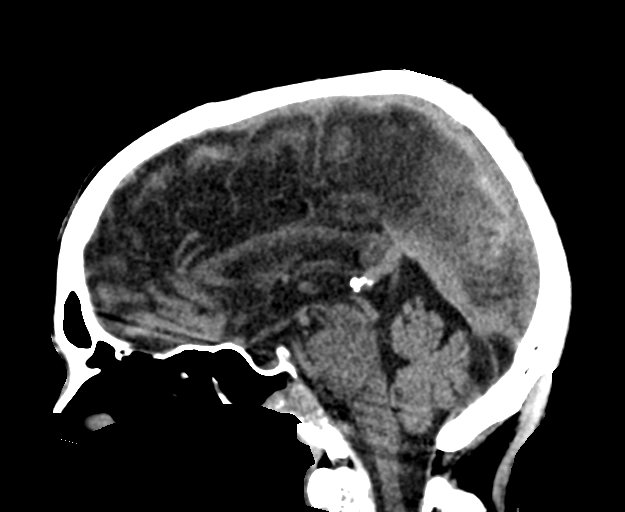
[im 37/56  brain]
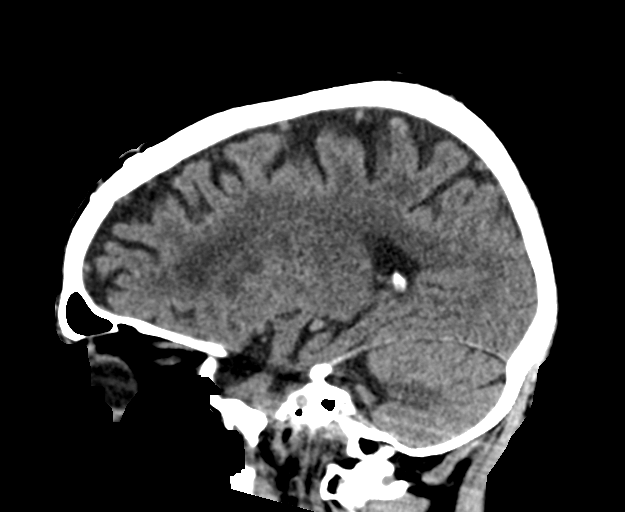

[16 of 47 positions shown; findings below may reference images not displayed]

FINDINGS: Brain: There is no acute intracranial hemorrhage, extra-axial fluid
collection, or acute infarct.

Parenchymal volume is within normal limits for age. Gray-white
differentiation is preserved.

The small enhancing lesion adjacent to the left porous acusticus is
not well seen on the current study. There is no other definite mass
lesion, within the confines of noncontrast technique. There is no
mass effect or midline shift.

Vascular: No hyperdense vessel or unexpected calcification.

Skull: Postsurgical changes reflecting bifrontal craniotomy are
noted. There is an infiltrative tumor in the right skull base with
lytic osseous destruction of the clivus and right occipital condyle
with extension through multiple skull base foramina and into the
right sphenoid sinus, described in detail on the recent MRI from
[DATE].

Sinuses/Orbits: There is mild mucosal thickening in the right
sphenoid sinus. The imaged globes and orbits are unremarkable.

Other: A hypodense lesion is seen in the right parapharyngeal space,
incompletely imaged and also better assessed on the recent brain
MRI.
IMPRESSION: 1. No evidence of acute intracranial pathology.
2. Infiltrative skull base tumor with involvement of multiple skull
base foramina, described in detail on the recent brain MRI from
[DATE].
[DATE]. Incompletely imaged lesion in the right parapharyngeal space,
also better seen on the prior MRI.
4. The small enhancing lesion in the region of the left porous
acoustic us is not well seen on the current study.

## 2021-08-10 IMAGING — DX DG CHEST 1V PORT
1 series · 1 of 1 positions shown · non-contrast
Comparison: PET-CT [DATE]. Prior chest radiographs [DATE]
and earlier.

CLINICAL DATA: Provided history: Weakness, history of metastatic
cancer.

EXAM:
PORTABLE CHEST 1 VIEW

[chest ap]
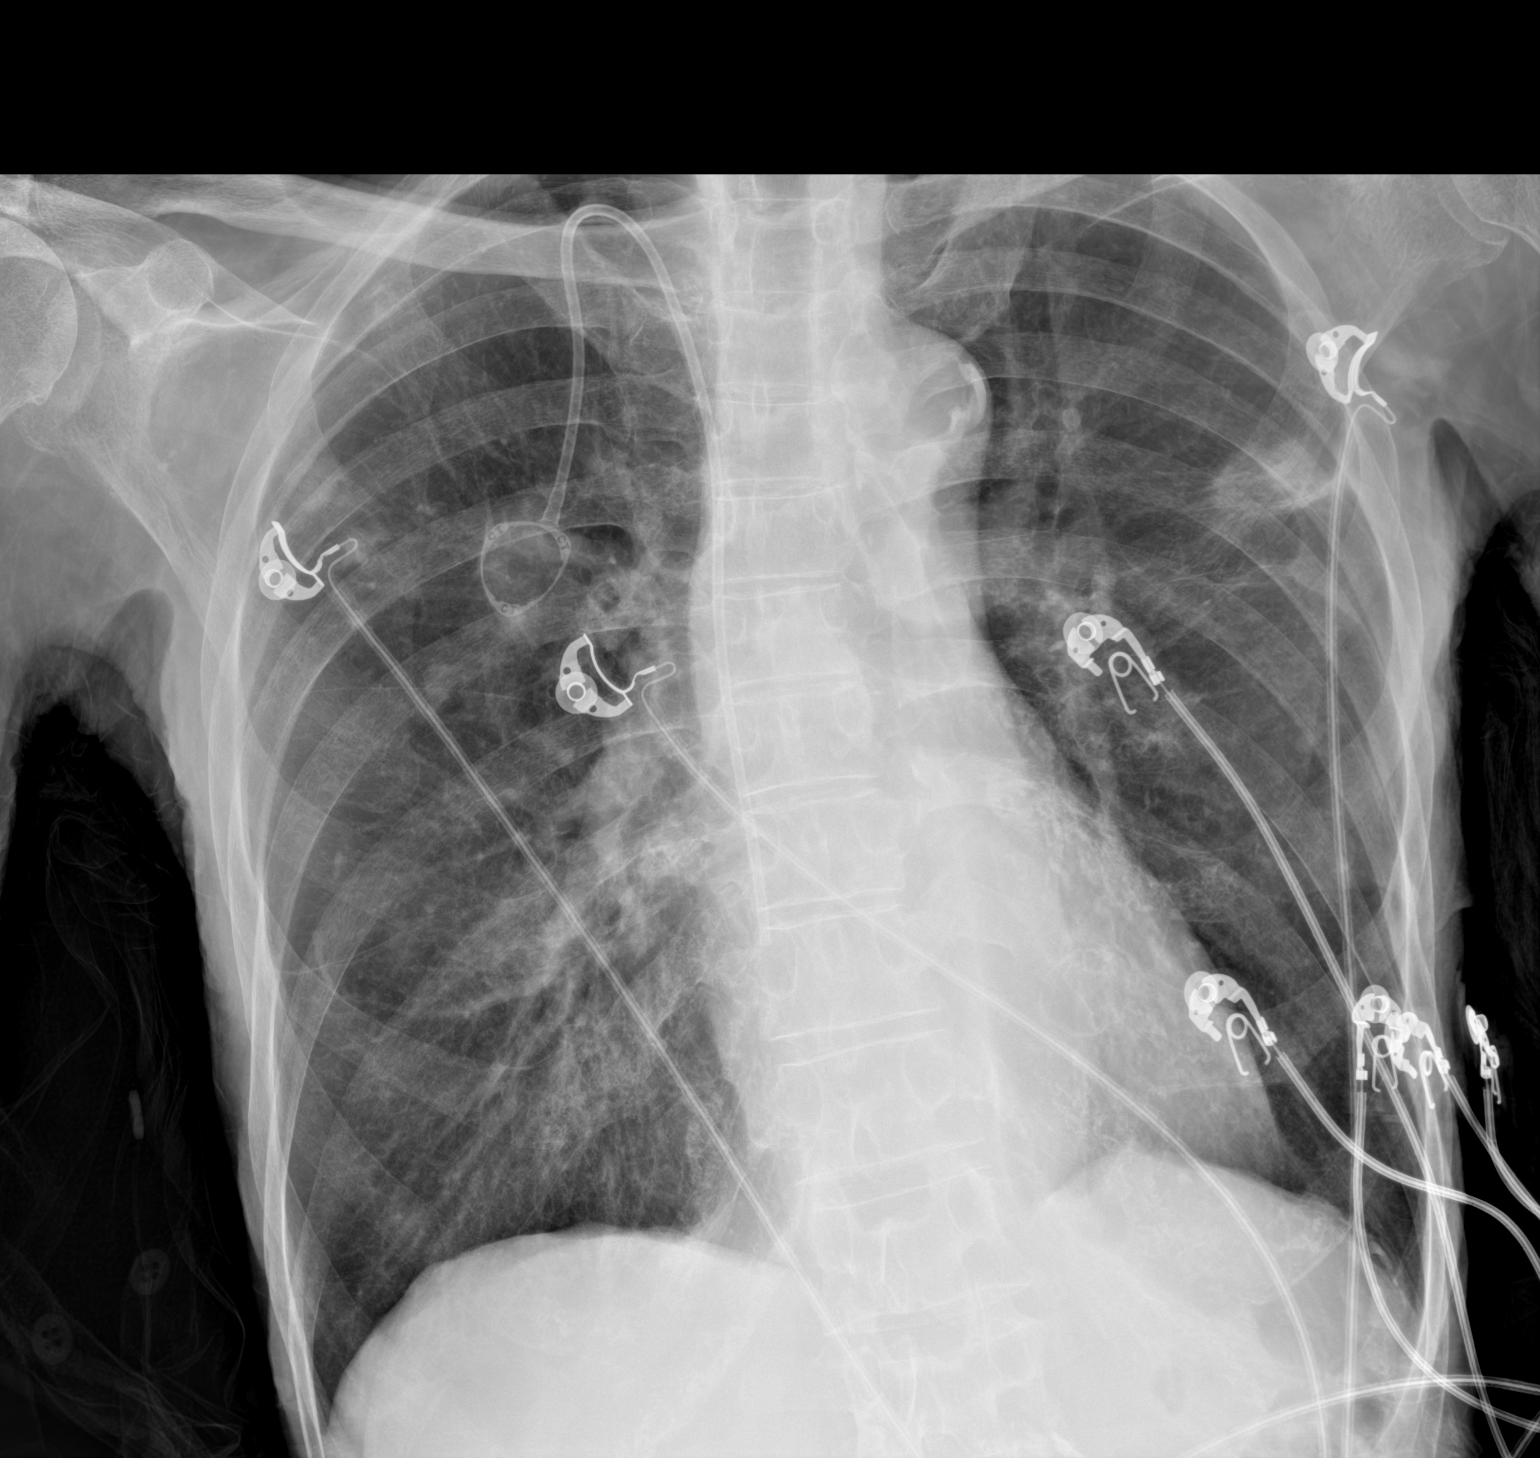

[1 of 1 positions shown; findings below may reference images not displayed]

FINDINGS: Right chest infusion port catheter present with tip projecting at
the level of the superior cavoatrial junction. Heart size within
normal limits. Aortic atherosclerosis. Redemonstrated focus of
scarring within the lateral upper-to-mid left lung field. A known
nodule within the anteromedial right upper lobe was better
appreciated on the prior PET-CT of [DATE]. Focus of ill-defined
opacity within the right lung base. No evidence of pleural effusion
or pneumothorax.
IMPRESSION: Focus of ill-defined opacity within the right lung base, which may
reflect atelectasis or pneumonia. Radiographic follow-up recommended
to ensure resolution and exclude underlying malignancy at this site.

Redemonstrated focus of scarring within the lateral mid-to-upper
left lung field.

A known pulmonary nodule within the medial right upper lobe was
better appreciated on the prior PET-CT of [DATE]. Please refer
to this prior report for further description.

Aortic Atherosclerosis ([5J]-[5J]).

## 2021-08-10 MED ORDER — SODIUM CHLORIDE 0.9 % IV BOLUS
1000.0000 mL | Freq: Once | INTRAVENOUS | Status: AC
  Administered 2021-08-10: 1000 mL via INTRAVENOUS

## 2021-08-10 NOTE — ED Notes (Signed)
In ands out cath performed. 222mL orange/yellow clear urine out. MD notified.

## 2021-08-10 NOTE — ED Notes (Signed)
The pt was placed on a male purwick.

## 2021-08-10 NOTE — ED Triage Notes (Signed)
Pt to ED via [facility] from home. Per EMS wife called due to pt having increased weakness upon waking around 0730am. Pt normally ambulatory but unable to get up this morning. Family states increased weight loss as well.   Pt with hx stage 4 bone cancer and tumor in between shoulder blades. Pt not currently receiving tx.   EMS VS: HR 83 BP 145/85 RA 99% RR 16 98.1 oral temp

## 2021-08-10 NOTE — ED Provider Notes (Signed)
[facility] Provider Note    Event Date/Time   First MD Initiated Contact with Patient 08/10/21 [phone]     (approximate)   History   Chief Complaint: Weakness   HPI  Gregory Burton is a 81 y.o. male with a history of dementia, metastatic oropharynx cancer and lung cancer, hypertension, alcohol abuse who comes the ED this morning due to generalized weakness.  Per EMS report from wife, patient normally is able to ambulate at home but this morning was too weak to get out of bed.  He denies any focal symptoms, no pain no nausea or vomiting or headache or chest pain or shortness of breath.     Physical Exam   Triage Vital Signs: ED Triage Vitals  Enc Vitals Group     BP 08/10/21 0853 139/88     Pulse Rate 08/10/21 0852 83     Resp 08/10/21 0852 (!) 29     Temp 08/10/21 0852 98 F (36.7 C)     Temp Source 08/10/21 0852 Oral     SpO2 08/10/21 0852 99 %     Weight --      Height 08/10/21 0853 5\' 9"  (1.753 m)     Head Circumference --      Peak Flow --      Pain Score 08/10/21 0855 9     Pain Loc --      Pain Edu? --      Excl. in [place]? --     Most recent vital signs: Vitals:   08/10/21 0853 08/10/21 1030  BP: 139/88 (!) 147/69  Pulse:  65  Resp:  17  Temp:    SpO2:  100%    General: Awake, chronically ill-appearing, no distress.  CV:  Good peripheral perfusion.  Regular rate and rhythm Resp:  Normal effort.  Clear to auscultation bilaterally Abd:  No distention.  Soft and nontender Other:  No inflammatory soft tissue findings.  Dry mucous membranes.   ED Results / Procedures / Treatments   Labs (all labs ordered are listed, but only abnormal results are displayed) Labs Reviewed  COMPREHENSIVE METABOLIC PANEL - Abnormal; Notable for the following components:      Result Value   Sodium 150 (*)    Chloride 115 (*)    Glucose, Bld 114 (*)    Albumin 3.2 (*)    All other components within normal limits  CBC WITH DIFFERENTIAL/PLATELET -  Abnormal; Notable for the following components:   RBC 3.74 (*)    Hemoglobin 11.9 (*)    HCT 38.7 (*)    MCV 103.5 (*)    RDW 16.1 (*)    Neutro Abs 7.9 (*)    Lymphs Abs 0.6 (*)    All other components within normal limits  URINALYSIS, ROUTINE W REFLEX MICROSCOPIC - Abnormal; Notable for the following components:   Color, Urine YELLOW (*)    APPearance CLEAR (*)    All other components within normal limits  LIPASE, BLOOD     EKG Interpreted by me Sinus rhythm rate of 81.  Normal axis and intervals.  Normal QRS ST segments and T waves.  No ischemic changes.   RADIOLOGY CT head viewed and interpreted by me, no acute intracranial findings.  Radiology report reviewed.  Chest x-ray interpreted by me, no consolidation.  Radiology report reviewed noting some pulmonary nodule   PROCEDURES:  Procedures   MEDICATIONS ORDERED IN ED: Medications  sodium chloride 0.9 % bolus 1,000 mL (  1,000 mLs Intravenous New Bag/Given 08/10/21 0948)     IMPRESSION / MDM / ASSESSMENT AND PLAN / ED COURSE  I reviewed the triage vital signs and the nursing notes.                              Differential diagnosis includes, but is not limited to, pneumonia, UTI, dehydration, hypercalcemia, electrolyte abnormality, AKI, intracranial hemorrhage, cerebral edema  Patient's presentation is most consistent with acute presentation with potential threat to life or bodily function.  Patient with complicated past medical history comes ED with generalized weakness.  No focal complaints or exam findings.  Vital signs unremarkable.  Serum labs, chest x-ray, CT head all reassuring.  Will reassess after IV fluid hydration.   ----------------------------------------- 2:40 PM on 08/10/2021 ----------------------------------------- Feeling better, vital signs normal.  Urinalysis normal, no urinary retention.  Results discussed with patient and spouse at bedside, agreeable to discharge home, has a follow-up  appointment with [name] of oncology.      FINAL CLINICAL IMPRESSION(S) / ED DIAGNOSES   Final diagnoses:  Generalized weakness  Dehydration     Rx / DC Orders   ED Discharge Orders     None        Note:  This document was prepared using Dragon voice recognition software and may include unintentional dictation errors.   Carrie Mew, MD 08/10/21 1440

## 2021-08-20 ENCOUNTER — Telehealth: Payer: Self-pay | Admitting: *Deleted

## 2021-08-20 NOTE — Telephone Encounter (Signed)
Hospice called stating that they have received a referral for hospice and are asking if Dr Cathie Hoops will serve as attending and sign orders, Also asking if she wants the Hospice doctor to cover the comfort medicines or will she. Please advise

## 2021-08-25 DEATH — deceased

## 2021-08-31 ENCOUNTER — Ambulatory Visit: Payer: Medicare Other | Admitting: Oncology

## 2021-08-31 ENCOUNTER — Other Ambulatory Visit: Payer: Medicare Other

## 2021-08-31 ENCOUNTER — Ambulatory Visit: Payer: Medicare Other

## 2021-08-31 ENCOUNTER — Other Ambulatory Visit: Payer: Self-pay | Admitting: Oncology

## 2021-08-31 ENCOUNTER — Encounter: Payer: Medicare Other | Admitting: Hospice and Palliative Medicine

## 2021-09-05 ENCOUNTER — Other Ambulatory Visit: Payer: Medicare Other | Admitting: Nurse Practitioner
